# Patient Record
Sex: Female | Born: 1955 | Race: White | Hispanic: No | Marital: Married | Smoking: Current every day smoker
Health system: Southern US, Community
[De-identification: ages and names within clinical notes are randomized; demographics above are authoritative.]

## PROBLEM LIST (undated history)

## (undated) DIAGNOSIS — M797 Fibromyalgia: Secondary | ICD-10-CM

## (undated) DIAGNOSIS — G9340 Encephalopathy, unspecified: Secondary | ICD-10-CM

## (undated) DIAGNOSIS — Z720 Tobacco use: Secondary | ICD-10-CM

## (undated) DIAGNOSIS — I639 Cerebral infarction, unspecified: Secondary | ICD-10-CM

## (undated) DIAGNOSIS — I10 Essential (primary) hypertension: Secondary | ICD-10-CM

## (undated) DIAGNOSIS — I700 Atherosclerosis of aorta: Secondary | ICD-10-CM

## (undated) DIAGNOSIS — E119 Type 2 diabetes mellitus without complications: Secondary | ICD-10-CM

## (undated) HISTORY — PX: HERNIA REPAIR: SHX51

## (undated) HISTORY — PX: CHOLECYSTECTOMY: SHX55

## (undated) HISTORY — PX: CERVICAL BIOPSY: SHX590

---

## 1998-05-05 ENCOUNTER — Emergency Department (HOSPITAL_COMMUNITY): Admission: EM | Admit: 1998-05-05 | Discharge: 1998-05-05 | Payer: Self-pay | Admitting: Emergency Medicine

## 1999-08-29 ENCOUNTER — Emergency Department (HOSPITAL_COMMUNITY): Admission: EM | Admit: 1999-08-29 | Discharge: 1999-08-29 | Payer: Self-pay | Admitting: Emergency Medicine

## 2000-08-25 ENCOUNTER — Emergency Department (HOSPITAL_COMMUNITY): Admission: EM | Admit: 2000-08-25 | Discharge: 2000-08-25 | Payer: Self-pay | Admitting: Emergency Medicine

## 2001-03-02 ENCOUNTER — Emergency Department (HOSPITAL_COMMUNITY): Admission: EM | Admit: 2001-03-02 | Discharge: 2001-03-02 | Payer: Self-pay | Admitting: Emergency Medicine

## 2001-03-25 ENCOUNTER — Encounter: Admission: RE | Admit: 2001-03-25 | Discharge: 2001-03-25 | Payer: Self-pay | Admitting: Internal Medicine

## 2001-03-26 ENCOUNTER — Encounter: Admission: RE | Admit: 2001-03-26 | Discharge: 2001-03-26 | Payer: Self-pay | Admitting: Obstetrics

## 2001-03-27 ENCOUNTER — Other Ambulatory Visit: Admission: RE | Admit: 2001-03-27 | Discharge: 2001-03-27 | Payer: Self-pay | Admitting: Obstetrics

## 2001-03-27 ENCOUNTER — Encounter: Admission: RE | Admit: 2001-03-27 | Discharge: 2001-03-27 | Payer: Self-pay | Admitting: *Deleted

## 2001-03-28 ENCOUNTER — Emergency Department (HOSPITAL_COMMUNITY): Admission: EM | Admit: 2001-03-28 | Discharge: 2001-03-29 | Payer: Self-pay | Admitting: Emergency Medicine

## 2001-12-08 ENCOUNTER — Emergency Department (HOSPITAL_COMMUNITY): Admission: EM | Admit: 2001-12-08 | Discharge: 2001-12-08 | Payer: Self-pay | Admitting: *Deleted

## 2001-12-08 ENCOUNTER — Encounter: Payer: Self-pay | Admitting: *Deleted

## 2001-12-23 ENCOUNTER — Encounter: Admission: RE | Admit: 2001-12-23 | Discharge: 2001-12-23 | Payer: Self-pay | Admitting: Internal Medicine

## 2001-12-31 ENCOUNTER — Emergency Department (HOSPITAL_COMMUNITY): Admission: EM | Admit: 2001-12-31 | Discharge: 2001-12-31 | Payer: Self-pay | Admitting: Emergency Medicine

## 2002-01-17 ENCOUNTER — Encounter: Payer: Self-pay | Admitting: Emergency Medicine

## 2002-01-17 ENCOUNTER — Emergency Department (HOSPITAL_COMMUNITY): Admission: EM | Admit: 2002-01-17 | Discharge: 2002-01-17 | Payer: Self-pay | Admitting: Emergency Medicine

## 2002-03-25 ENCOUNTER — Emergency Department (HOSPITAL_COMMUNITY): Admission: EM | Admit: 2002-03-25 | Discharge: 2002-03-26 | Payer: Self-pay | Admitting: Emergency Medicine

## 2002-04-22 ENCOUNTER — Encounter: Admission: RE | Admit: 2002-04-22 | Discharge: 2002-04-22 | Payer: Self-pay | Admitting: *Deleted

## 2002-04-26 ENCOUNTER — Encounter: Admission: RE | Admit: 2002-04-26 | Discharge: 2002-04-26 | Payer: Self-pay | Admitting: Internal Medicine

## 2002-04-27 ENCOUNTER — Ambulatory Visit (HOSPITAL_COMMUNITY): Admission: RE | Admit: 2002-04-27 | Discharge: 2002-04-27 | Payer: Self-pay | Admitting: *Deleted

## 2002-05-06 ENCOUNTER — Encounter: Admission: RE | Admit: 2002-05-06 | Discharge: 2002-05-06 | Payer: Self-pay | Admitting: Obstetrics and Gynecology

## 2002-06-02 ENCOUNTER — Emergency Department (HOSPITAL_COMMUNITY): Admission: EM | Admit: 2002-06-02 | Discharge: 2002-06-02 | Payer: Self-pay | Admitting: Emergency Medicine

## 2003-07-12 ENCOUNTER — Ambulatory Visit (HOSPITAL_COMMUNITY): Admission: RE | Admit: 2003-07-12 | Discharge: 2003-07-12 | Payer: Self-pay | Admitting: Advanced Practice Midwife

## 2003-07-12 ENCOUNTER — Encounter: Payer: Self-pay | Admitting: Obstetrics and Gynecology

## 2003-12-05 ENCOUNTER — Emergency Department (HOSPITAL_COMMUNITY): Admission: EM | Admit: 2003-12-05 | Discharge: 2003-12-05 | Payer: Self-pay | Admitting: Emergency Medicine

## 2004-01-29 ENCOUNTER — Emergency Department (HOSPITAL_COMMUNITY): Admission: AD | Admit: 2004-01-29 | Discharge: 2004-01-29 | Payer: Self-pay | Admitting: Family Medicine

## 2004-01-29 ENCOUNTER — Emergency Department (HOSPITAL_COMMUNITY): Admission: EM | Admit: 2004-01-29 | Discharge: 2004-01-29 | Payer: Self-pay | Admitting: Emergency Medicine

## 2004-05-31 ENCOUNTER — Emergency Department (HOSPITAL_COMMUNITY): Admission: EM | Admit: 2004-05-31 | Discharge: 2004-05-31 | Payer: Self-pay | Admitting: Emergency Medicine

## 2004-05-31 IMAGING — CR DG CERVICAL SPINE COMPLETE 4+V
5 series · 5 of 5 positions shown · non-contrast
Comparison: none

CLINICAL DATA: Fell this morning with pain in the neck. 
 CLEARING CERVICAL SPINE 
 Cross-table lateral clearing views of the cervical spine were obtained with the patient in a collar.  The cervical vertebrae are in normal alignment with normal intervertebral disk spaces.  No prevertebral soft tissue swelling is seen.
 IMPRESSION
 Negative cross-table lateral clearing views of the cervical spine.
 CERVICAL SPINE COMPLETE
 Five views of the cervical spine were then obtained.  The cervical vertebrae are in normal alignment with normal intervertebral disk spaces.  There is perhaps very minimal narrowing of the C5-6 interspace.  No prevertebral soft tissue swelling is seen.  On the oblique views no acute abnormality is seen.  Minimal foraminal narrowing is seen at C5-6 with the remainder of the foramina being patent.  The odontoid process is intact.
 1.  Normal alignment with no acute abnormality.
 2.  Very mild degenerative disk disease at C5-6.

[view not recorded (1 of 5)]
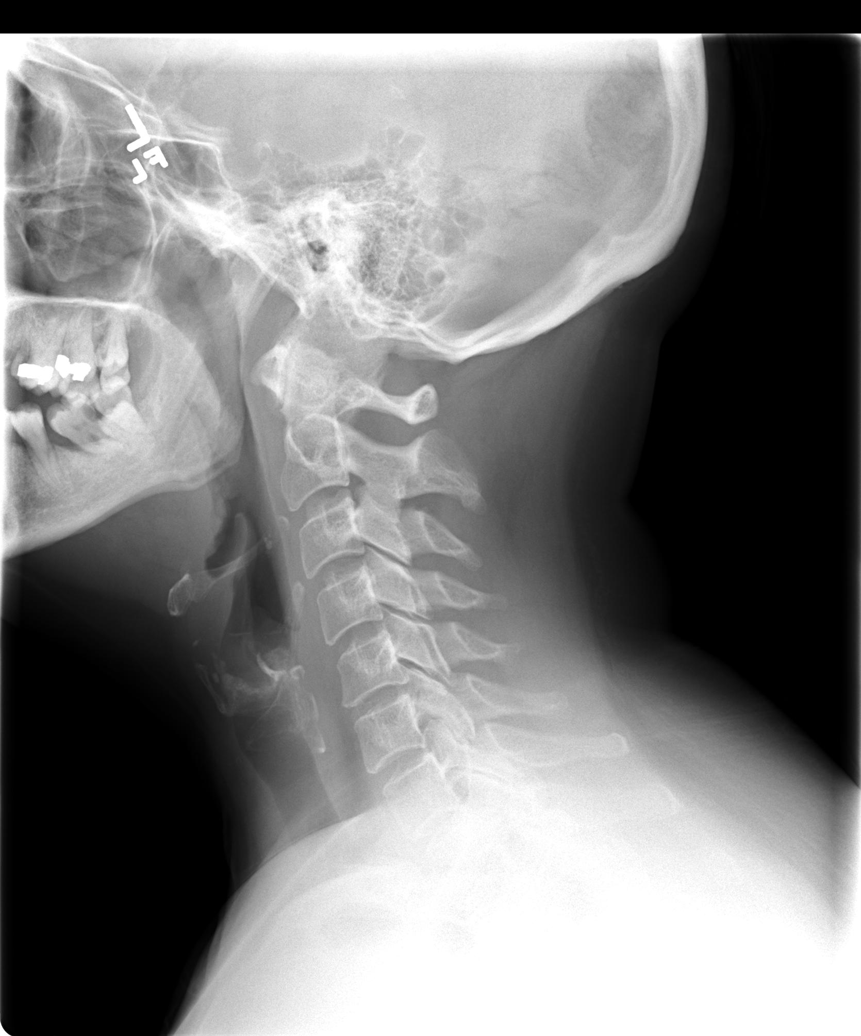

[view not recorded (2 of 5)]
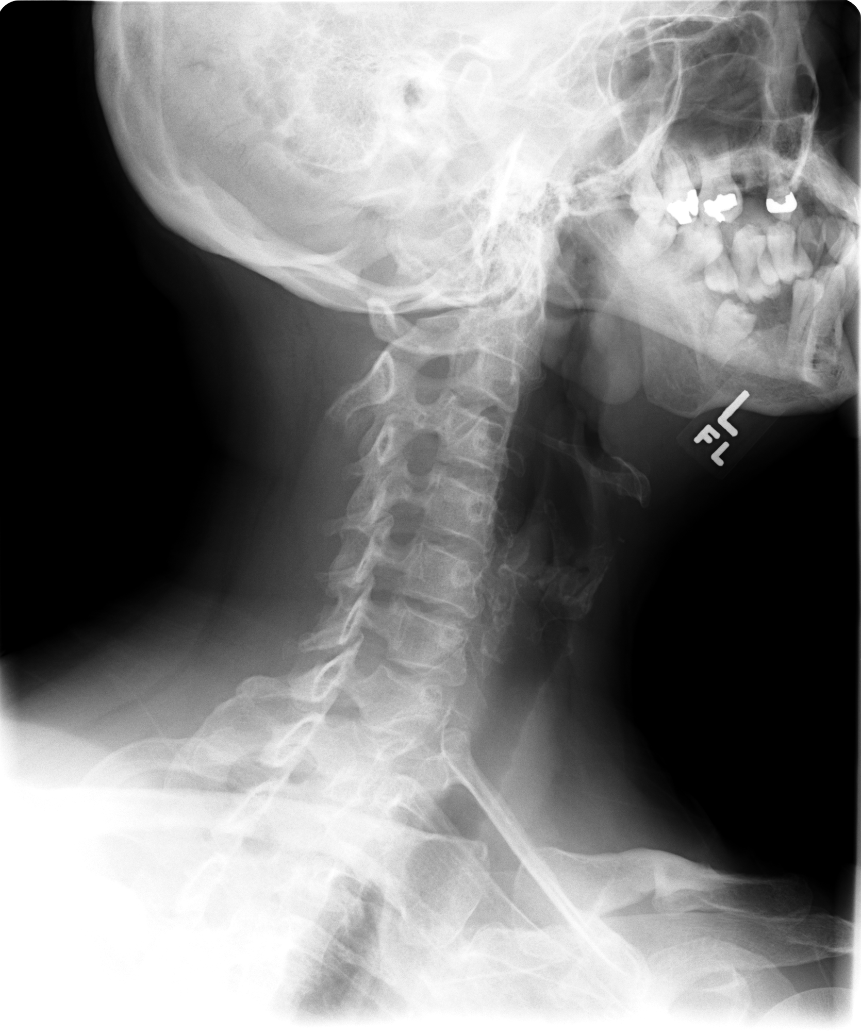

[view not recorded (3 of 5)]
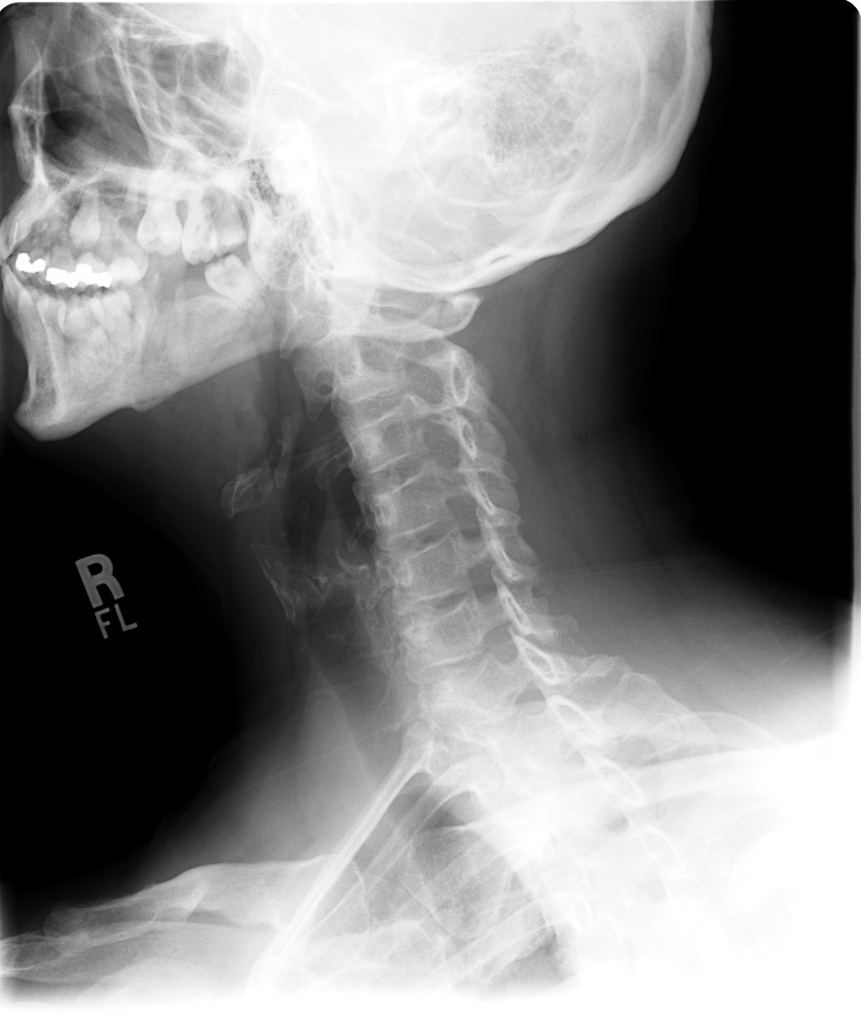

[view not recorded (4 of 5)]
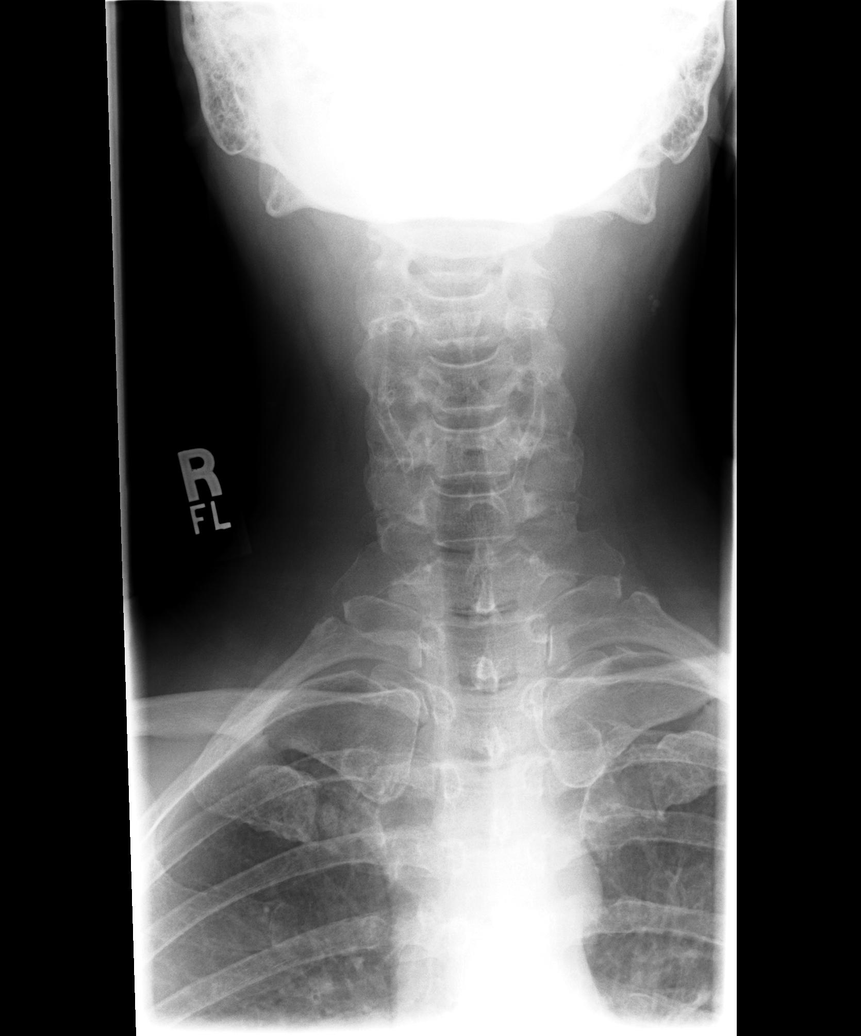

[view not recorded (5 of 5)]
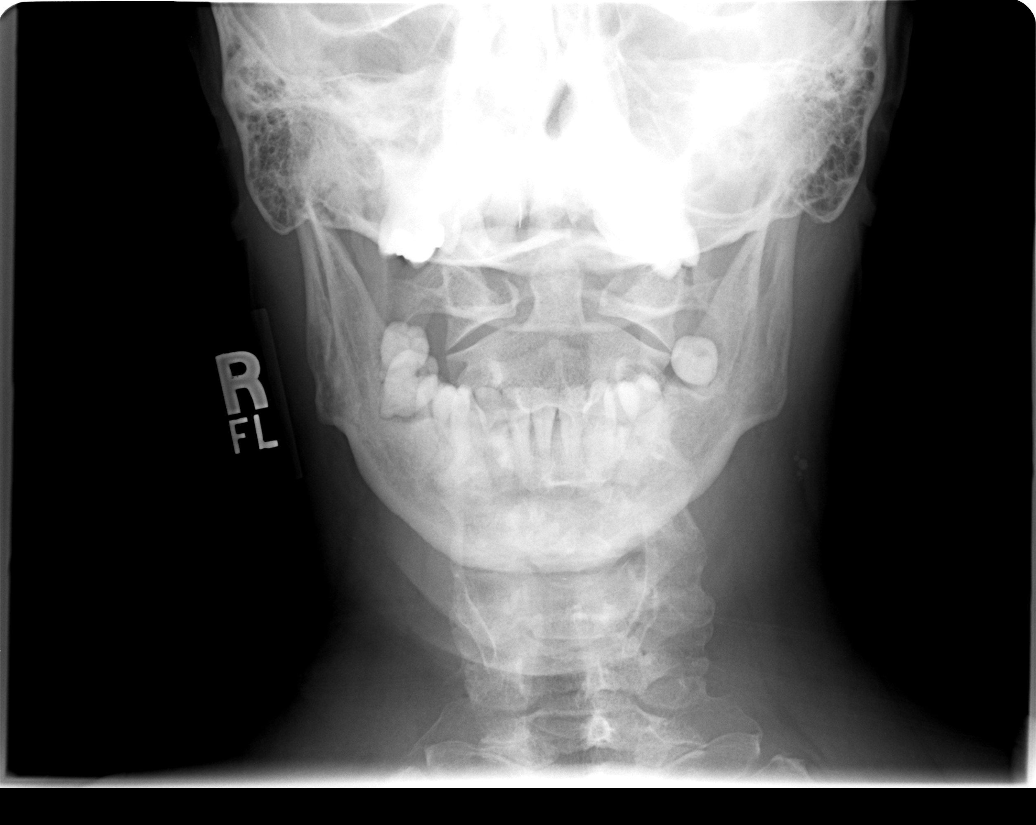

[5 of 5 positions shown; findings below may reference images not displayed]

## 2004-05-31 IMAGING — CR DG CERVICAL SPINE 1V CLEARING
2 series · 2 of 2 positions shown · non-contrast
Comparison: none

CLINICAL DATA: Fell this morning with pain in the neck. 
 CLEARING CERVICAL SPINE 
 Cross-table lateral clearing views of the cervical spine were obtained with the patient in a collar.  The cervical vertebrae are in normal alignment with normal intervertebral disk spaces.  No prevertebral soft tissue swelling is seen.
 IMPRESSION
 Negative cross-table lateral clearing views of the cervical spine.
 CERVICAL SPINE COMPLETE
 Five views of the cervical spine were then obtained.  The cervical vertebrae are in normal alignment with normal intervertebral disk spaces.  There is perhaps very minimal narrowing of the C5-6 interspace.  No prevertebral soft tissue swelling is seen.  On the oblique views no acute abnormality is seen.  Minimal foraminal narrowing is seen at C5-6 with the remainder of the foramina being patent.  The odontoid process is intact.
 1.  Normal alignment with no acute abnormality.
 2.  Very mild degenerative disk disease at C5-6.

[view not recorded (1 of 2)]
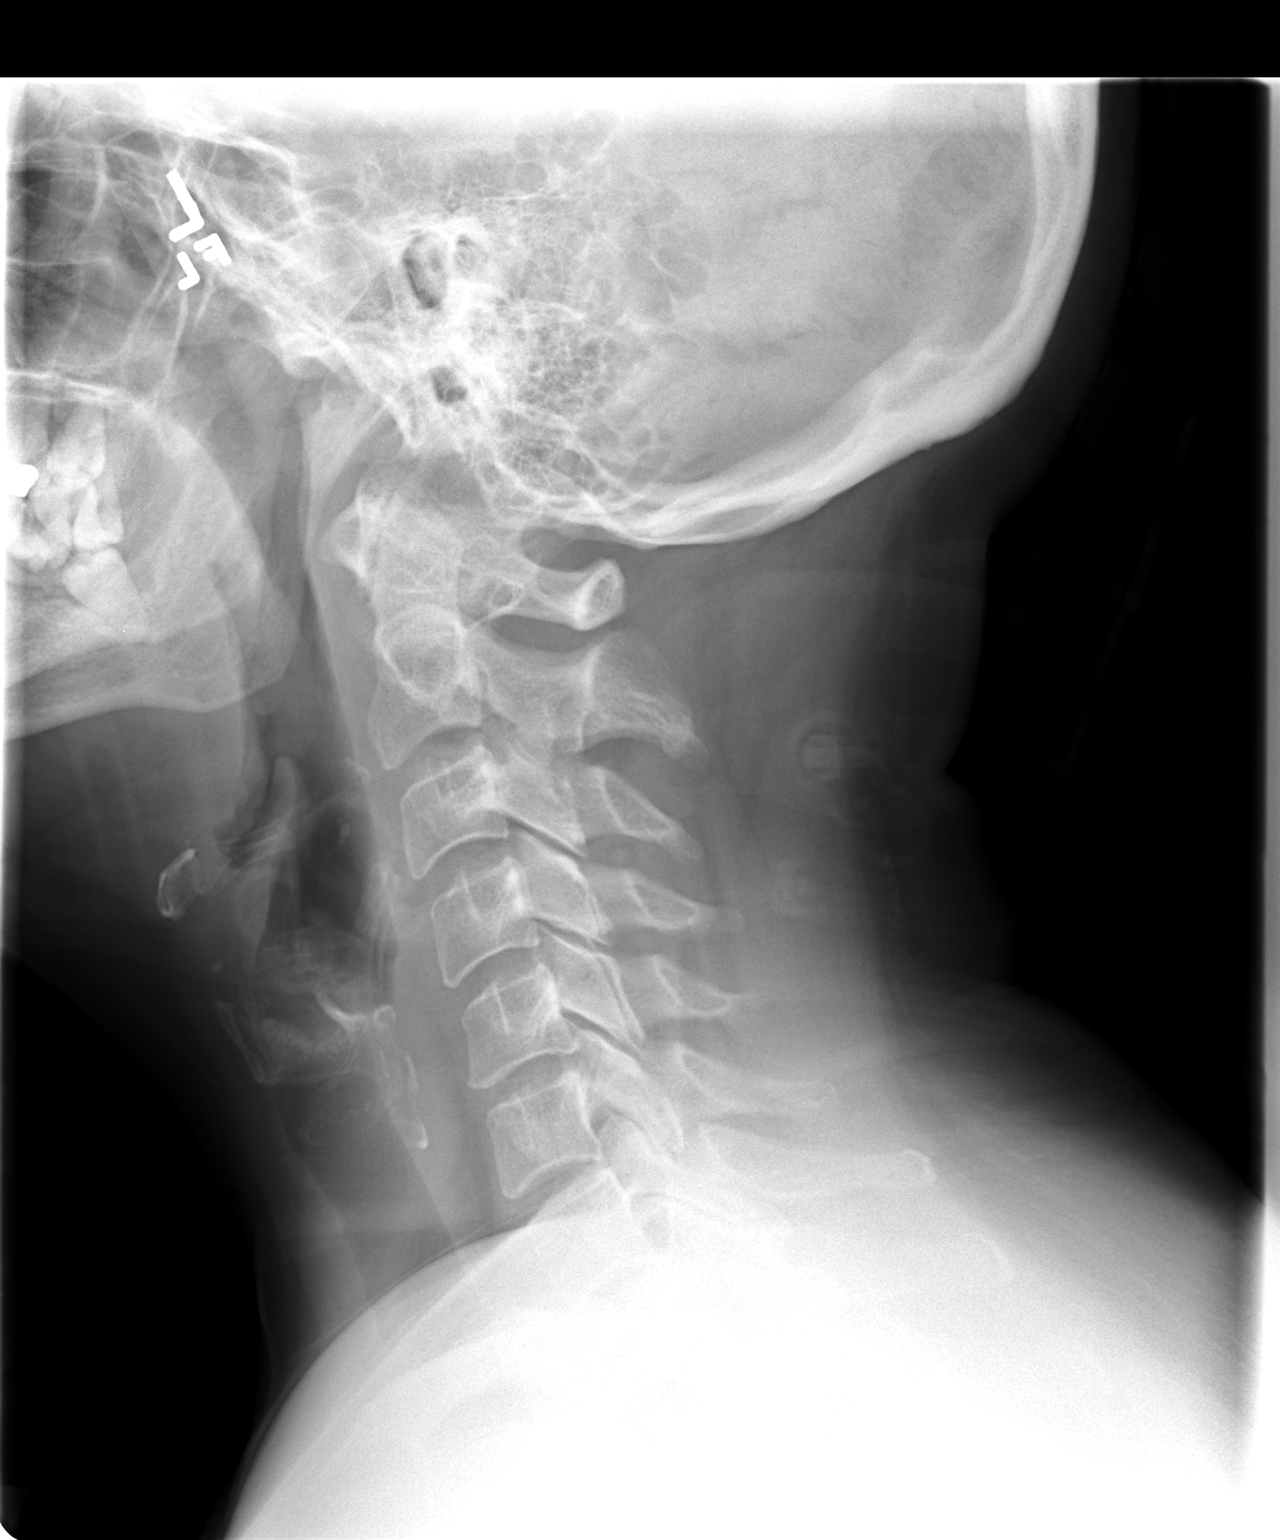

[view not recorded (2 of 2)]
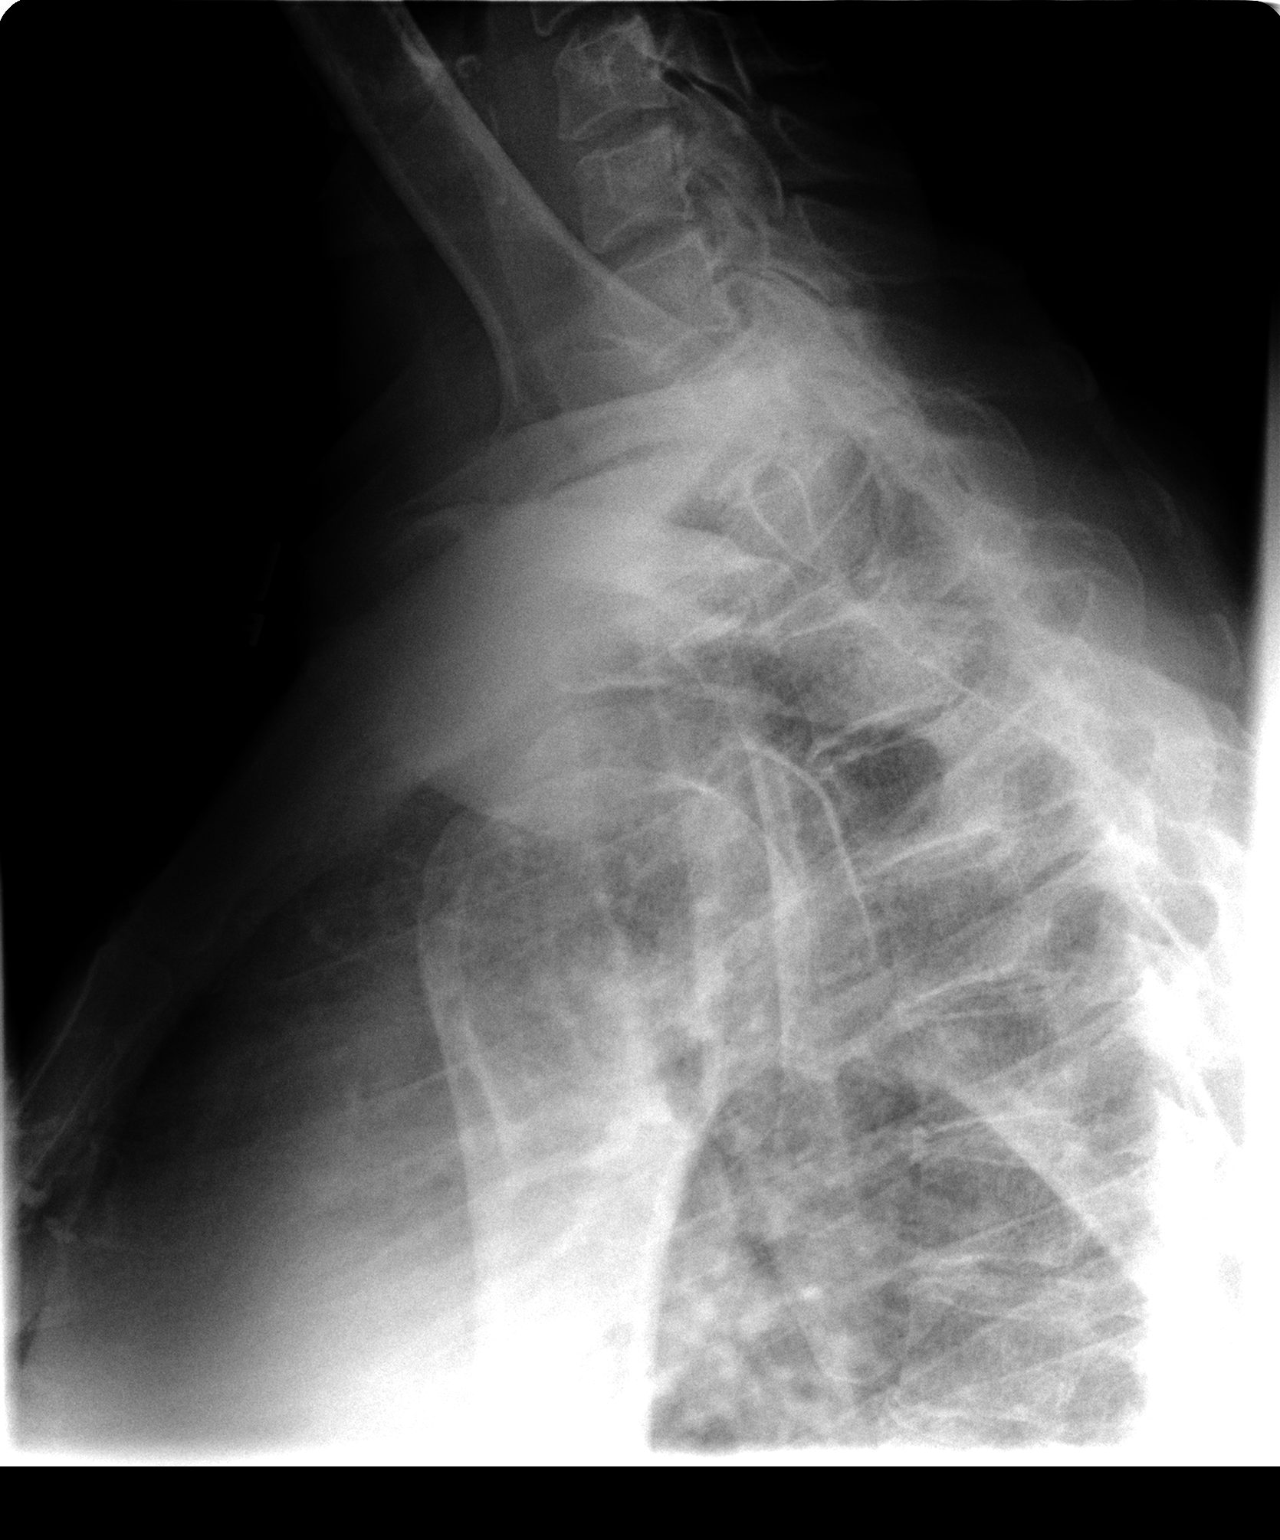

[2 of 2 positions shown; findings below may reference images not displayed]

## 2004-12-09 ENCOUNTER — Emergency Department (HOSPITAL_COMMUNITY): Admission: EM | Admit: 2004-12-09 | Discharge: 2004-12-10 | Payer: Self-pay | Admitting: Emergency Medicine

## 2004-12-09 IMAGING — CR DG KNEE COMPLETE 4+V*R*
4 series · 4 of 4 positions shown · non-contrast
Comparison: none

CLINICAL DATA: Knee injury; trauma; pain
 RIGHT KNEE FOUR VIEWS:
 Tricompartmental mild to moderate degenerative changes manifested by joint space narrowing and osteophytosis noted.  No evidence of acute fracture, subluxation, or dislocation.  Calcified loose body in the anterior joint is identified.  No evidence of fracture or knee effusion.

[view not recorded (1 of 4)]
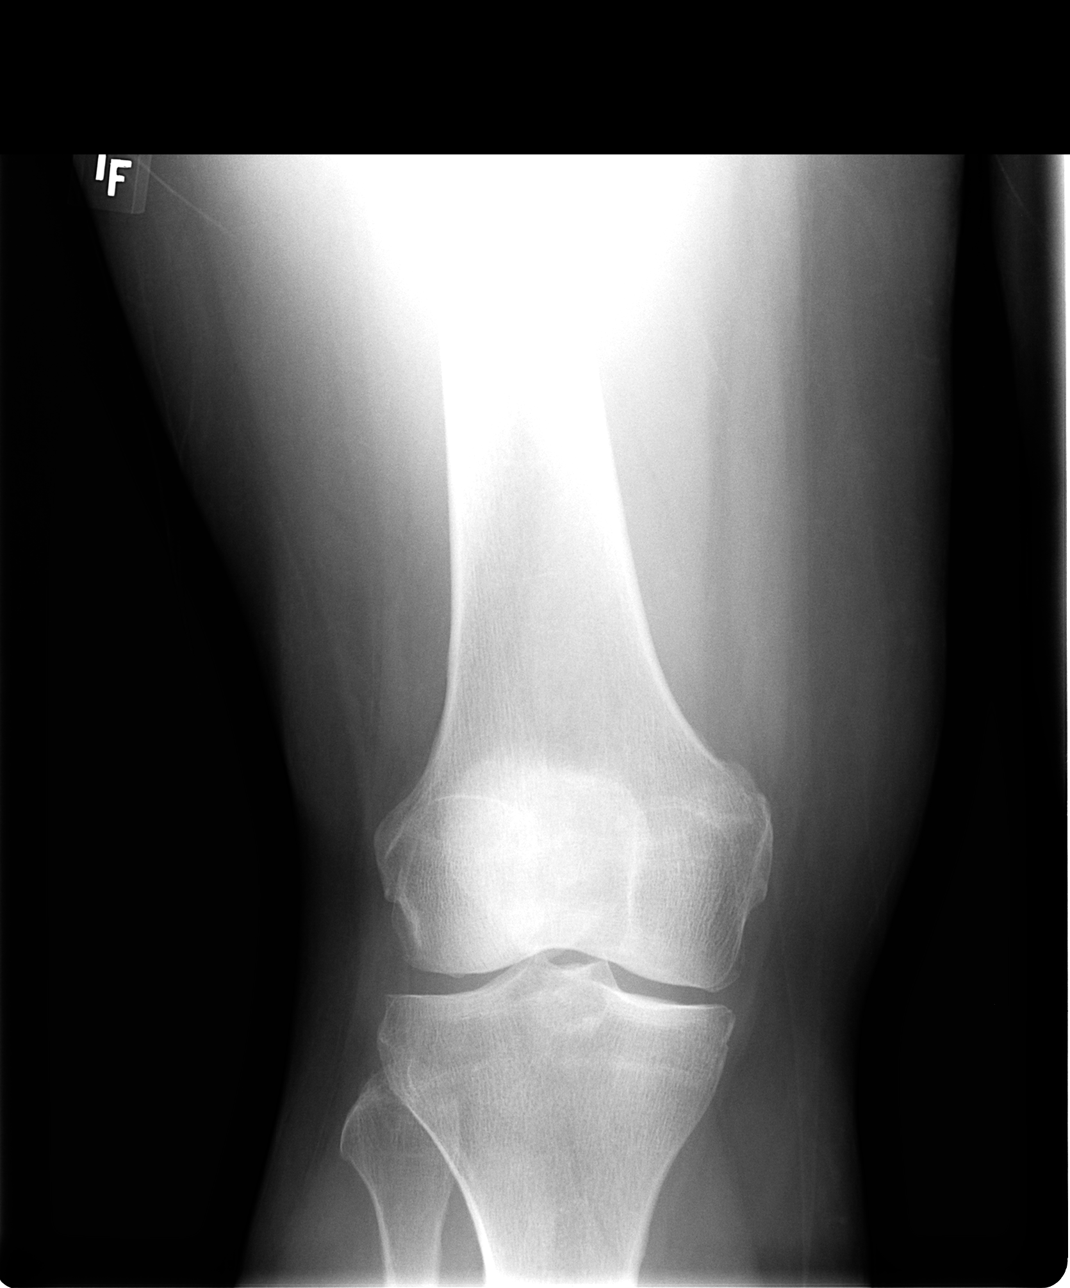

[view not recorded (2 of 4)]
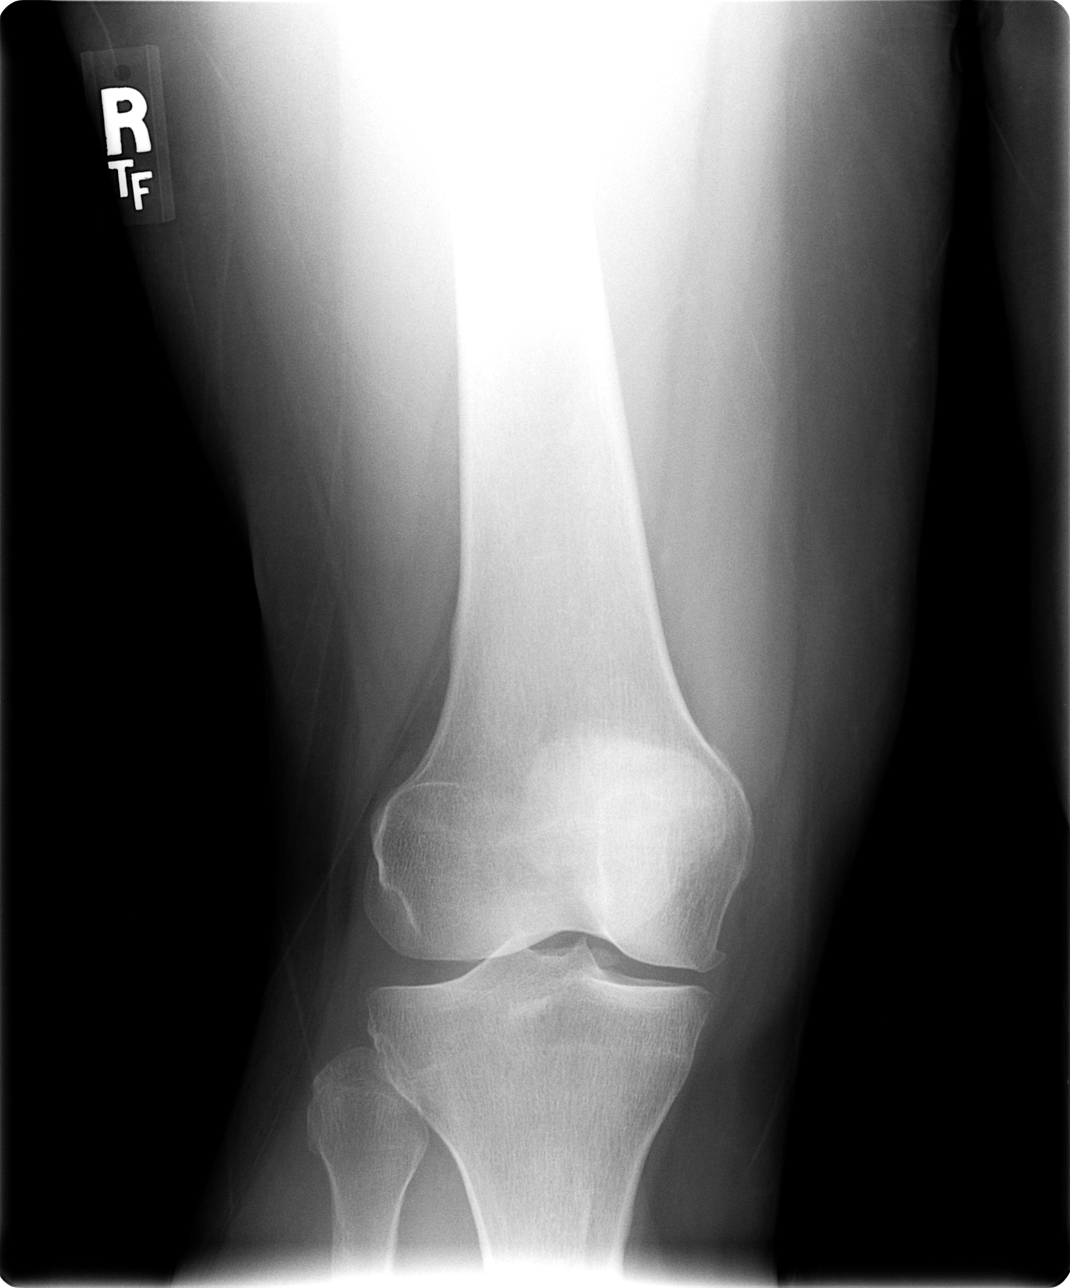

[view not recorded (3 of 4)]
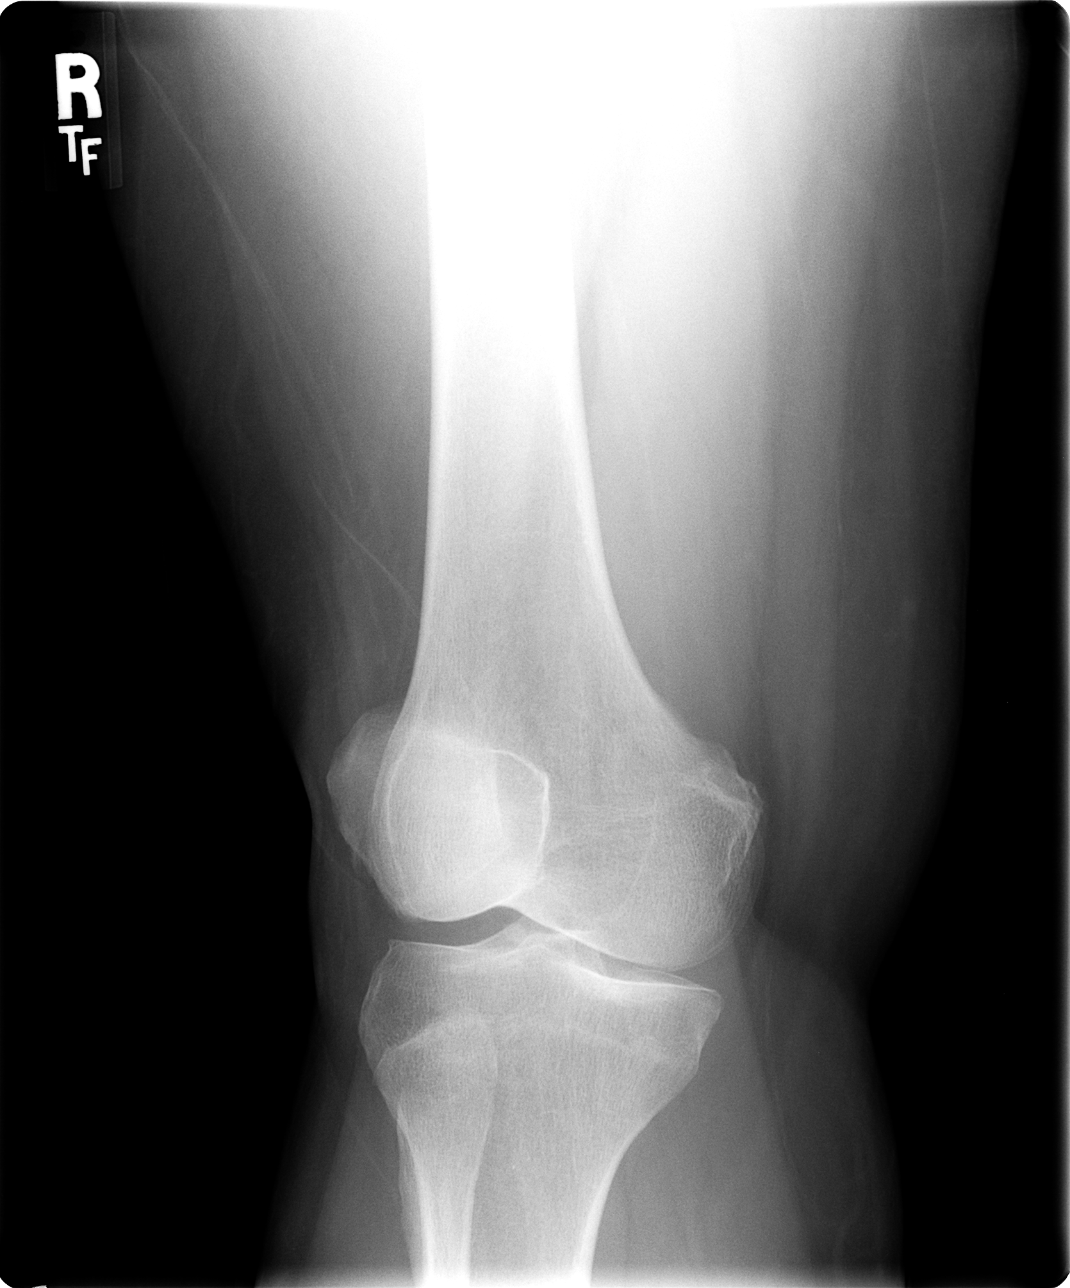

[view not recorded (4 of 4)]
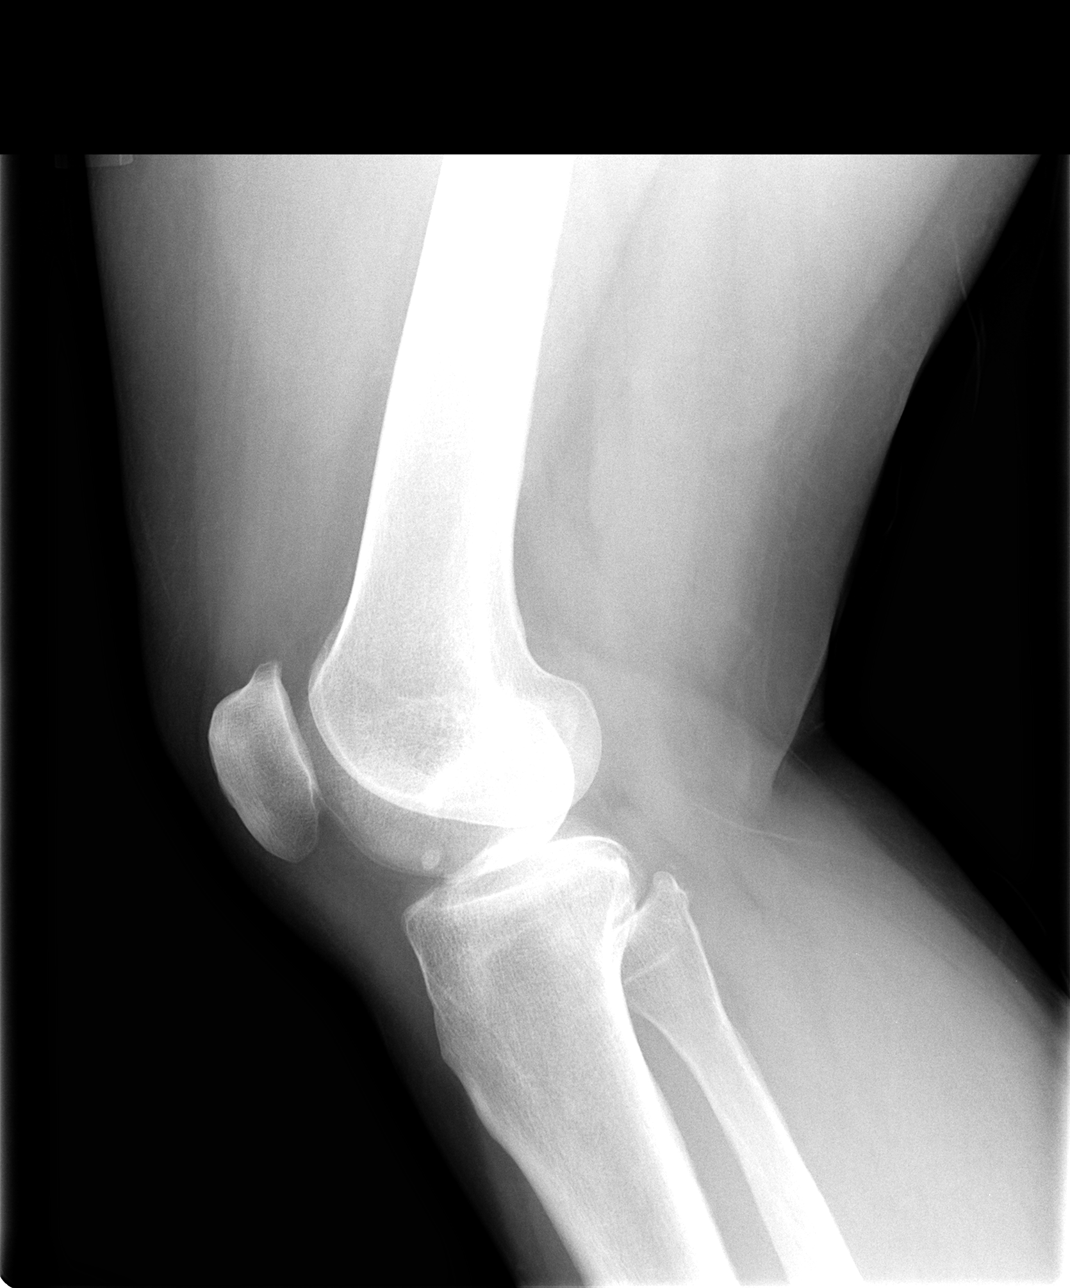

[4 of 4 positions shown; findings below may reference images not displayed]

IMPRESSION: 1.  No acute abnormality. 
 2.  Mild to moderate degenerative changes and loose body.

## 2004-12-18 ENCOUNTER — Emergency Department (HOSPITAL_COMMUNITY): Admission: EM | Admit: 2004-12-18 | Discharge: 2004-12-18 | Payer: Self-pay | Admitting: Emergency Medicine

## 2004-12-18 IMAGING — CR DG ABDOMEN 1V
1 series · 1 of 1 positions shown · non-contrast
Comparison: No images.

CLINICAL DATA: Upper abdominal pain.
 ONE VIEW ABDOMEN:

[view not recorded]
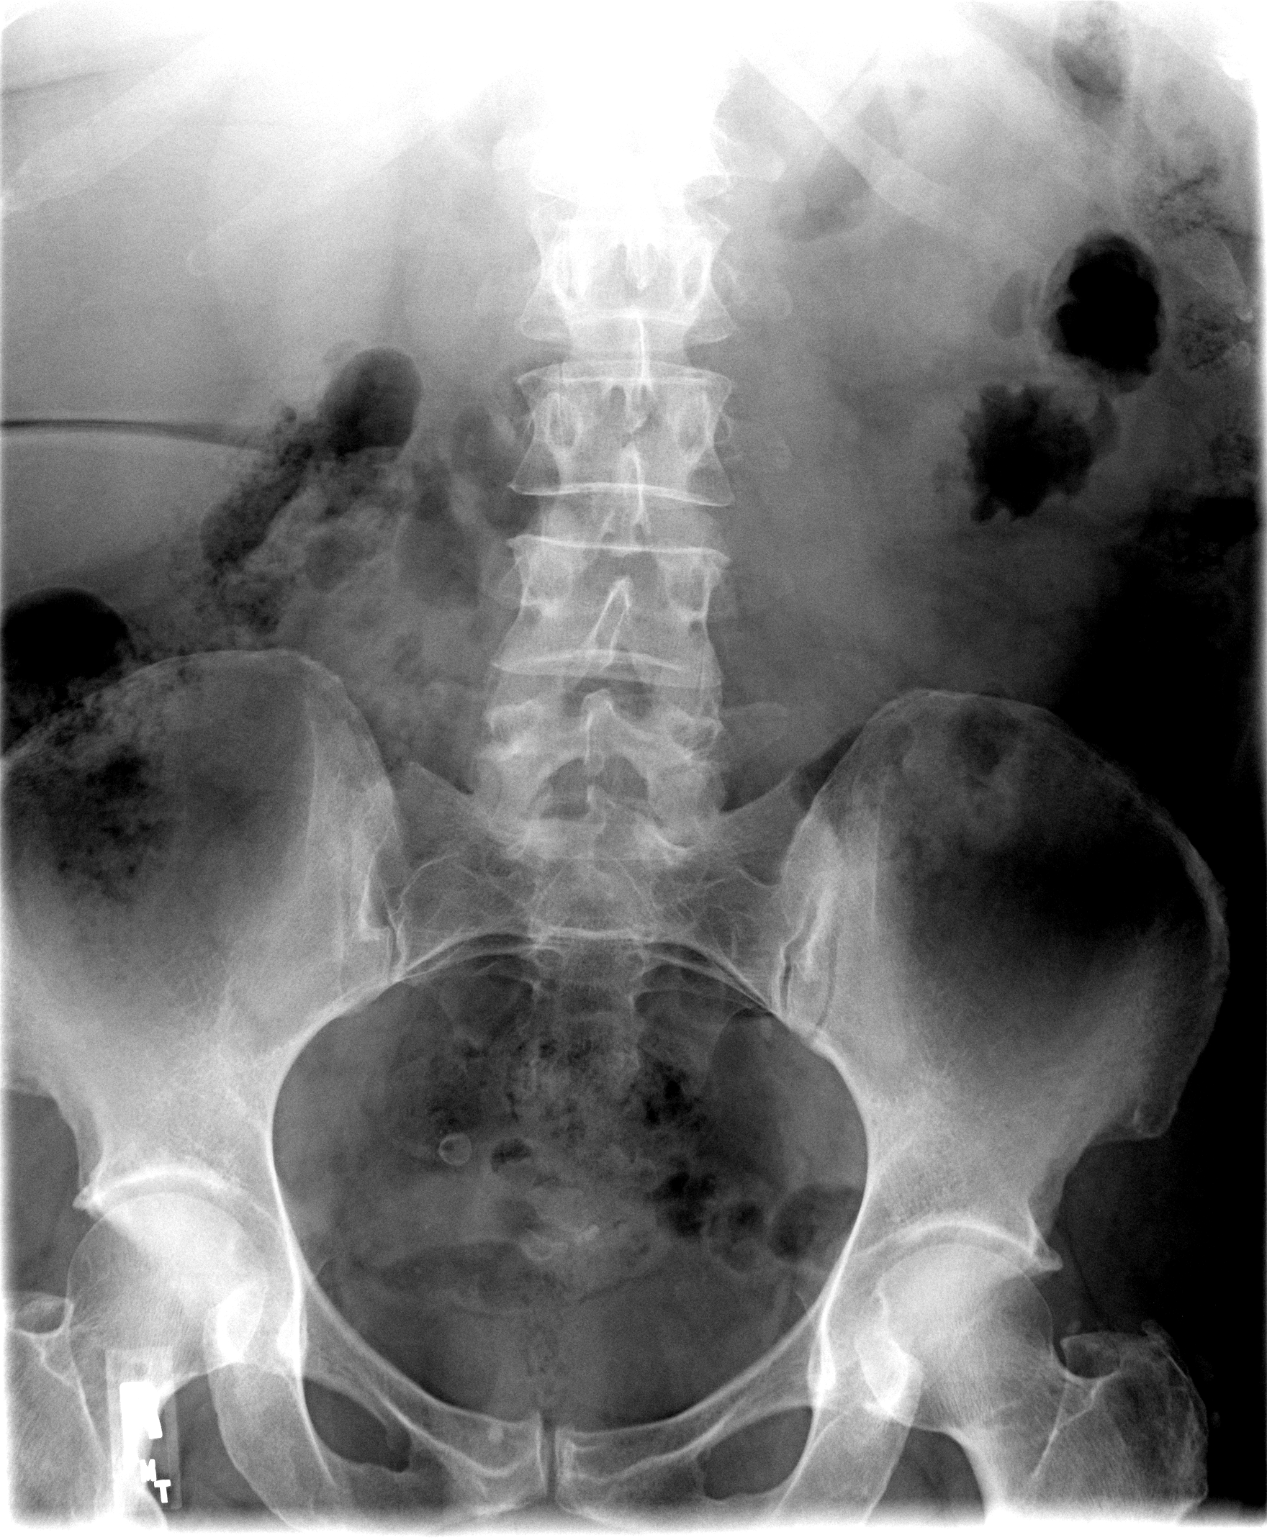

[1 of 1 positions shown; findings below may reference images not displayed]

There is a report of a film done on [DATE] describing cholecystectomy clips and possible hepatomegaly.
FINDINGS: As previously reported, there is possible hepatomegaly and cholecystectomy clips.  The psoas margins are intact.  There is a phlebolith in the right pelvis.
IMPRESSION: 1.  Possible hepatomegaly.
 2.  Prior cholecystectomy.
 3.  No acute or specific findings.

## 2005-01-01 ENCOUNTER — Ambulatory Visit: Payer: Self-pay | Admitting: Family Medicine

## 2005-02-07 ENCOUNTER — Emergency Department (HOSPITAL_COMMUNITY): Admission: EM | Admit: 2005-02-07 | Discharge: 2005-02-07 | Payer: Self-pay | Admitting: Emergency Medicine

## 2005-02-08 ENCOUNTER — Emergency Department (HOSPITAL_COMMUNITY): Admission: EM | Admit: 2005-02-08 | Discharge: 2005-02-08 | Payer: Self-pay | Admitting: *Deleted

## 2005-02-28 ENCOUNTER — Ambulatory Visit: Payer: Self-pay | Admitting: Family Medicine

## 2005-04-29 ENCOUNTER — Encounter: Admission: RE | Admit: 2005-04-29 | Discharge: 2005-04-29 | Payer: Self-pay | Admitting: Obstetrics & Gynecology

## 2005-05-02 ENCOUNTER — Emergency Department (HOSPITAL_COMMUNITY): Admission: EM | Admit: 2005-05-02 | Discharge: 2005-05-02 | Payer: Self-pay | Admitting: Emergency Medicine

## 2005-09-14 ENCOUNTER — Emergency Department (HOSPITAL_COMMUNITY): Admission: EM | Admit: 2005-09-14 | Discharge: 2005-09-14 | Payer: Self-pay | Admitting: Emergency Medicine

## 2006-03-26 ENCOUNTER — Emergency Department (HOSPITAL_COMMUNITY): Admission: EM | Admit: 2006-03-26 | Discharge: 2006-03-26 | Payer: Self-pay | Admitting: Emergency Medicine

## 2006-04-03 ENCOUNTER — Ambulatory Visit: Payer: Self-pay | Admitting: Family Medicine

## 2006-05-19 ENCOUNTER — Emergency Department (HOSPITAL_COMMUNITY): Admission: EM | Admit: 2006-05-19 | Discharge: 2006-05-19 | Payer: Self-pay | Admitting: Emergency Medicine

## 2006-06-03 ENCOUNTER — Emergency Department (HOSPITAL_COMMUNITY): Admission: EM | Admit: 2006-06-03 | Discharge: 2006-06-03 | Payer: Self-pay | Admitting: Emergency Medicine

## 2006-06-04 ENCOUNTER — Emergency Department (HOSPITAL_COMMUNITY): Admission: EM | Admit: 2006-06-04 | Discharge: 2006-06-04 | Payer: Self-pay | Admitting: Emergency Medicine

## 2006-06-09 ENCOUNTER — Emergency Department (HOSPITAL_COMMUNITY): Admission: EM | Admit: 2006-06-09 | Discharge: 2006-06-09 | Payer: Self-pay | Admitting: Emergency Medicine

## 2006-06-27 ENCOUNTER — Emergency Department (HOSPITAL_COMMUNITY): Admission: EM | Admit: 2006-06-27 | Discharge: 2006-06-27 | Payer: Self-pay | Admitting: Emergency Medicine

## 2009-10-31 ENCOUNTER — Encounter: Payer: Self-pay | Admitting: Family Medicine

## 2010-12-02 ENCOUNTER — Encounter: Payer: Self-pay | Admitting: Family Medicine

## 2019-06-25 ENCOUNTER — Other Ambulatory Visit: Payer: Self-pay

## 2019-06-25 ENCOUNTER — Emergency Department: Payer: Medicare HMO

## 2019-06-25 ENCOUNTER — Observation Stay
Admission: EM | Admit: 2019-06-25 | Discharge: 2019-06-26 | Disposition: A | Discharge status: Home / Self Care | Payer: Medicare HMO | Attending: Internal Medicine | Admitting: Internal Medicine

## 2019-06-25 ENCOUNTER — Observation Stay: Payer: Medicare HMO

## 2019-06-25 DIAGNOSIS — Z79899 Other long term (current) drug therapy: Secondary | ICD-10-CM | POA: Insufficient documentation

## 2019-06-25 DIAGNOSIS — M79601 Pain in right arm: Secondary | ICD-10-CM | POA: Diagnosis present

## 2019-06-25 DIAGNOSIS — I10 Essential (primary) hypertension: Secondary | ICD-10-CM | POA: Diagnosis not present

## 2019-06-25 DIAGNOSIS — R7989 Other specified abnormal findings of blood chemistry: Secondary | ICD-10-CM | POA: Diagnosis present

## 2019-06-25 DIAGNOSIS — R778 Other specified abnormalities of plasma proteins: Secondary | ICD-10-CM | POA: Diagnosis present

## 2019-06-25 DIAGNOSIS — I700 Atherosclerosis of aorta: Secondary | ICD-10-CM | POA: Diagnosis not present

## 2019-06-25 DIAGNOSIS — E119 Type 2 diabetes mellitus without complications: Secondary | ICD-10-CM | POA: Insufficient documentation

## 2019-06-25 DIAGNOSIS — Z20828 Contact with and (suspected) exposure to other viral communicable diseases: Secondary | ICD-10-CM | POA: Insufficient documentation

## 2019-06-25 DIAGNOSIS — R000 Tachycardia, unspecified: Secondary | ICD-10-CM | POA: Insufficient documentation

## 2019-06-25 DIAGNOSIS — M79621 Pain in right upper arm: Principal | ICD-10-CM | POA: Insufficient documentation

## 2019-06-25 DIAGNOSIS — R9431 Abnormal electrocardiogram [ECG] [EKG]: Secondary | ICD-10-CM

## 2019-06-25 DIAGNOSIS — F17210 Nicotine dependence, cigarettes, uncomplicated: Secondary | ICD-10-CM | POA: Diagnosis not present

## 2019-06-25 DIAGNOSIS — Z794 Long term (current) use of insulin: Secondary | ICD-10-CM | POA: Diagnosis not present

## 2019-06-25 DIAGNOSIS — M79603 Pain in arm, unspecified: Secondary | ICD-10-CM | POA: Diagnosis present

## 2019-06-25 DIAGNOSIS — I2510 Atherosclerotic heart disease of native coronary artery without angina pectoris: Secondary | ICD-10-CM | POA: Insufficient documentation

## 2019-06-25 DIAGNOSIS — Z9049 Acquired absence of other specified parts of digestive tract: Secondary | ICD-10-CM | POA: Diagnosis not present

## 2019-06-25 DIAGNOSIS — M797 Fibromyalgia: Secondary | ICD-10-CM | POA: Diagnosis not present

## 2019-06-25 DIAGNOSIS — K838 Other specified diseases of biliary tract: Secondary | ICD-10-CM | POA: Insufficient documentation

## 2019-06-25 DIAGNOSIS — I200 Unstable angina: Secondary | ICD-10-CM

## 2019-06-25 HISTORY — DX: Atherosclerosis of aorta: I70.0

## 2019-06-25 HISTORY — DX: Essential (primary) hypertension: I10

## 2019-06-25 HISTORY — DX: Type 2 diabetes mellitus without complications: E11.9

## 2019-06-25 HISTORY — DX: Tobacco use: Z72.0

## 2019-06-25 LAB — CBC
HCT: 44.5 % (ref 36.0–46.0)
Hemoglobin: 15.4 g/dL — ABNORMAL HIGH (ref 12.0–15.0)
MCH: 30.6 pg (ref 26.0–34.0)
MCHC: 34.6 g/dL (ref 30.0–36.0)
MCV: 88.5 fL (ref 80.0–100.0)
Platelets: 385 10*3/uL (ref 150–400)
RBC: 5.03 MIL/uL (ref 3.87–5.11)
RDW: 13.4 % (ref 11.5–15.5)
WBC: 9.1 10*3/uL (ref 4.0–10.5)
nRBC: 0 % (ref 0.0–0.2)

## 2019-06-25 LAB — CK: Total CK: 100 U/L (ref 38–234)

## 2019-06-25 LAB — BASIC METABOLIC PANEL
Anion gap: 13 (ref 5–15)
BUN: 23 mg/dL (ref 8–23)
CO2: 23 mmol/L (ref 22–32)
Calcium: 9.7 mg/dL (ref 8.9–10.3)
Chloride: 97 mmol/L — ABNORMAL LOW (ref 98–111)
Creatinine, Ser: 0.72 mg/dL (ref 0.44–1.00)
GFR calc Af Amer: >60 mL/min (ref >60)
GFR calc non Af Amer: >60 mL/min (ref >60)
Glucose, Bld: 253 mg/dL — ABNORMAL HIGH (ref 70–99)
Potassium: 4 mmol/L (ref 3.5–5.1)
Sodium: 133 mmol/L — ABNORMAL LOW (ref 135–145)

## 2019-06-25 LAB — PROTIME-INR
INR: 0.9 (ref 0.8–1.2)
Prothrombin Time: 12.5 seconds (ref 11.4–15.2)

## 2019-06-25 LAB — HEPATIC FUNCTION PANEL
ALT: 19 U/L (ref 0–44)
AST: 28 U/L (ref 15–41)
Albumin: 3.4 g/dL — ABNORMAL LOW (ref 3.5–5.0)
Alkaline Phosphatase: 90 U/L (ref 38–126)
Bilirubin, Direct: 0.4 mg/dL — ABNORMAL HIGH (ref 0.0–0.2)
Indirect Bilirubin: 0.8 mg/dL (ref 0.3–0.9)
Total Bilirubin: 1.2 mg/dL (ref 0.3–1.2)
Total Protein: 6.5 g/dL (ref 6.5–8.1)

## 2019-06-25 LAB — HEMOGLOBIN A1C
Hgb A1c MFr Bld: 11.4 % — ABNORMAL HIGH (ref 4.8–5.6)
Mean Plasma Glucose: 280.48 mg/dL

## 2019-06-25 LAB — APTT: aPTT: 25 seconds (ref 24–36)

## 2019-06-25 LAB — GLUCOSE, CAPILLARY: Glucose-Capillary: 311 mg/dL — ABNORMAL HIGH (ref 70–99)

## 2019-06-25 LAB — TROPONIN I (HIGH SENSITIVITY)
Troponin I (High Sensitivity): 187 ng/L (ref <18)
Troponin I (High Sensitivity): 171 ng/L (ref <18)

## 2019-06-25 LAB — SARS CORONAVIRUS 2 BY RT PCR (HOSPITAL ORDER, PERFORMED IN ~~LOC~~ HOSPITAL LAB): SARS Coronavirus 2: NEGATIVE

## 2019-06-25 IMAGING — DX PORTABLE CHEST - 1 VIEW
1 series · 1 of 1 positions shown · non-contrast
Comparison: None.

CLINICAL DATA: Chest pain.

EXAM:
PORTABLE CHEST 1 VIEW

[chest ap]
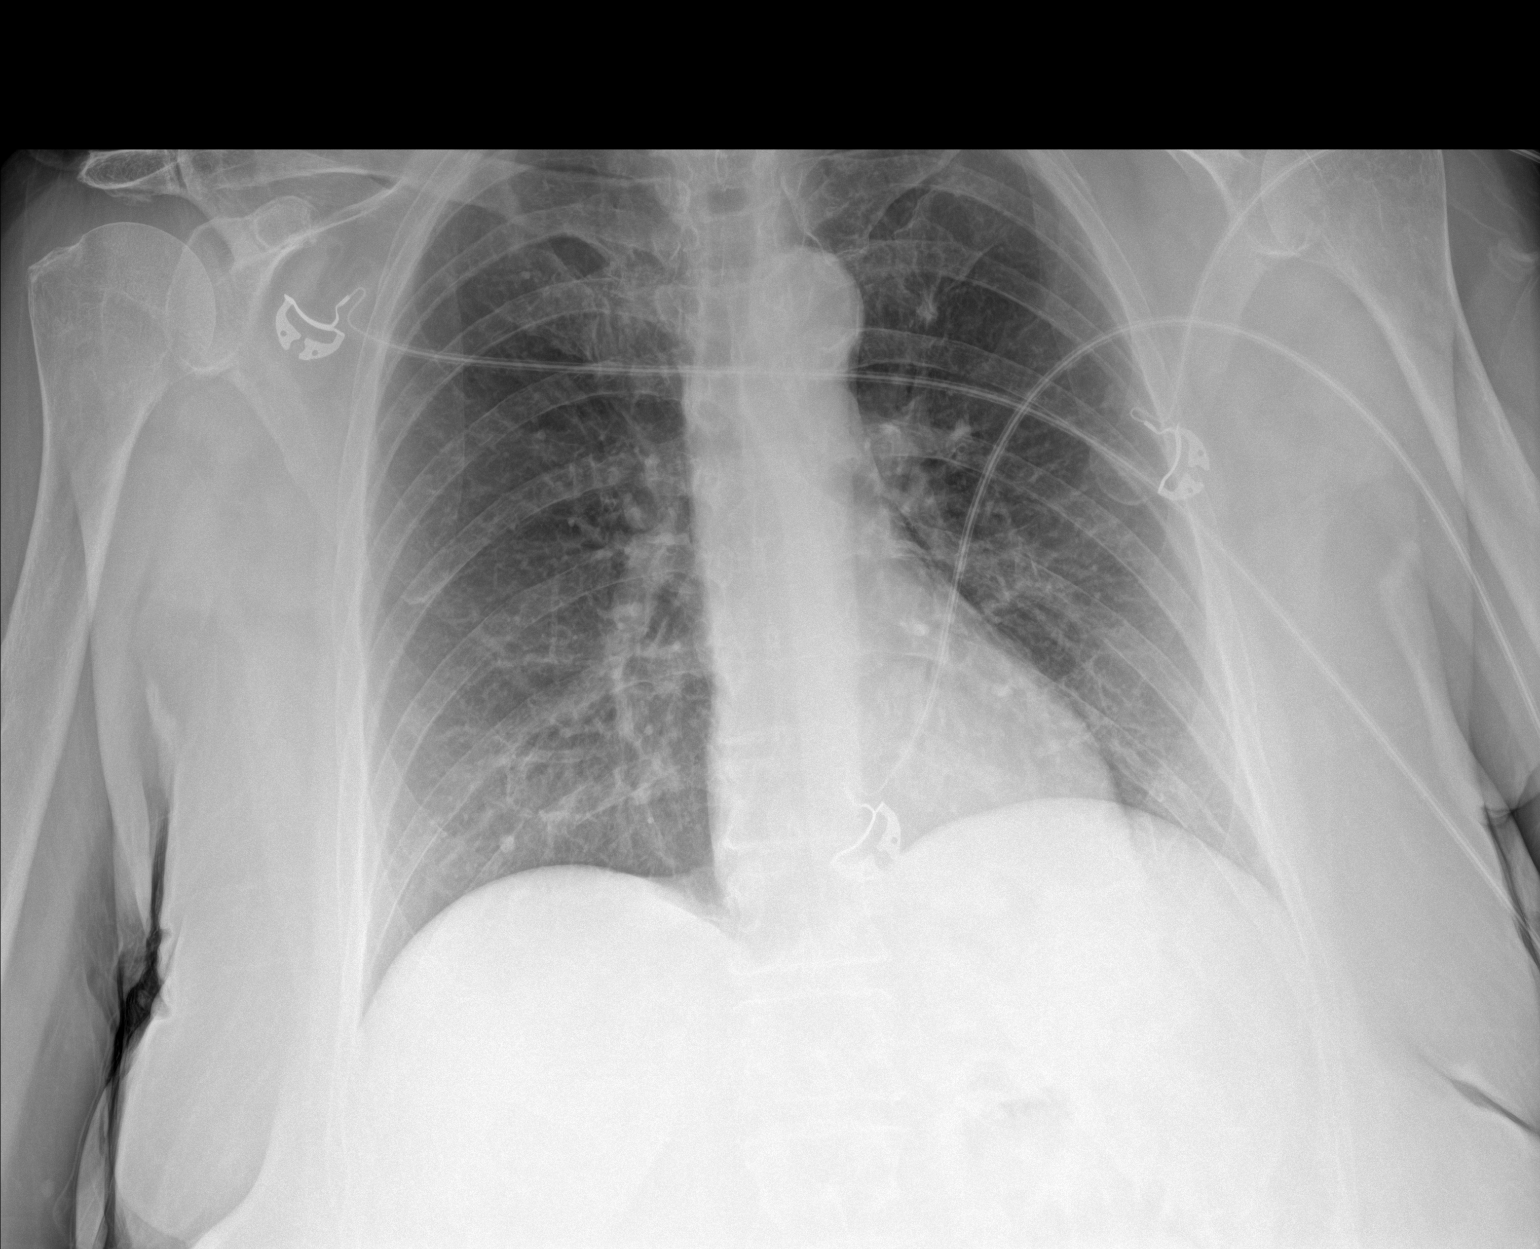

[1 of 1 positions shown; findings below may reference images not displayed]

FINDINGS: The heart size and mediastinal contours are within normal limits.
Both lungs are clear. No pneumothorax or pleural effusion is noted.
The visualized skeletal structures are unremarkable.
IMPRESSION: No active disease.

## 2019-06-25 IMAGING — CT CT ANGIO CHEST-ABD-PELV FOR DISSECTION W/ AND WO/W CM
2 of 7 series · 13 of 46 positions shown, 15 images · IV contrast (omnipaque)
Comparison: None.

CLINICAL DATA: Chest and back pain four days.

EXAM:
CT ANGIOGRAPHY CHEST, ABDOMEN AND PELVIS
TECHNIQUE: Multidetector CT imaging through the chest, abdomen and pelvis was
performed using the standard protocol during bolus administration of
intravenous contrast. Multiplanar reconstructed images and MIPs were
obtained and reviewed to evaluate the vascular anatomy.
CONTRAST:  100mL OMNIPAQUE IOHEXOL 350 MG/ML SOLN

[Series 4: axial arterial · axial · arterial · 0.66mm/px · z∈[-605,-47]mm · 10 of 216 slices shown, 12 images]
[im 15/216  soft-tissue]
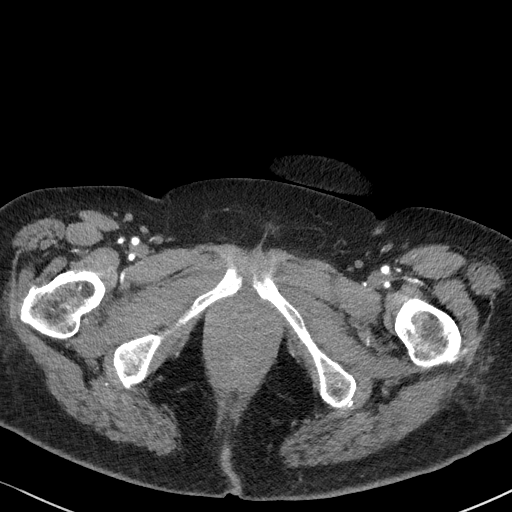
[im 15/216  bone]
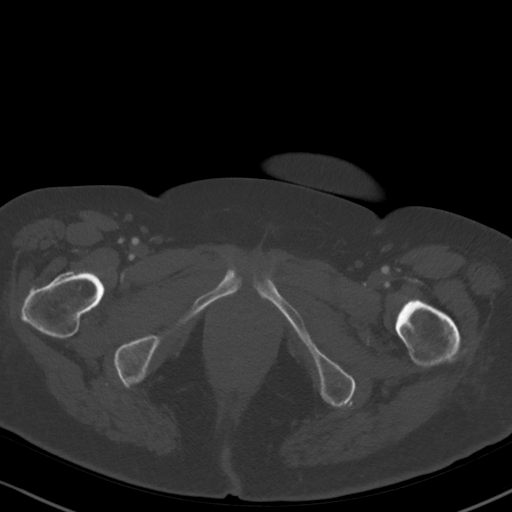
[im 44/216  soft-tissue]
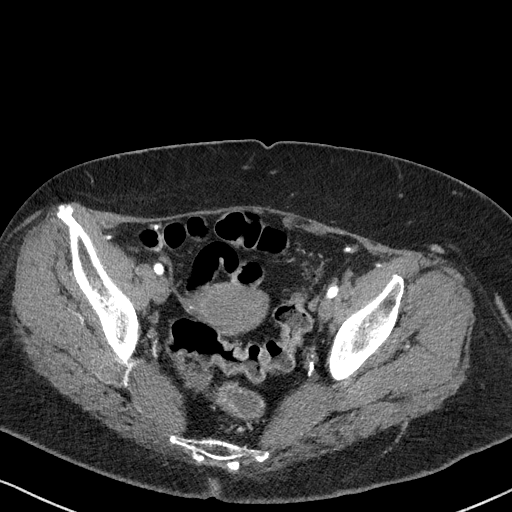
[im 58/216  soft-tissue]
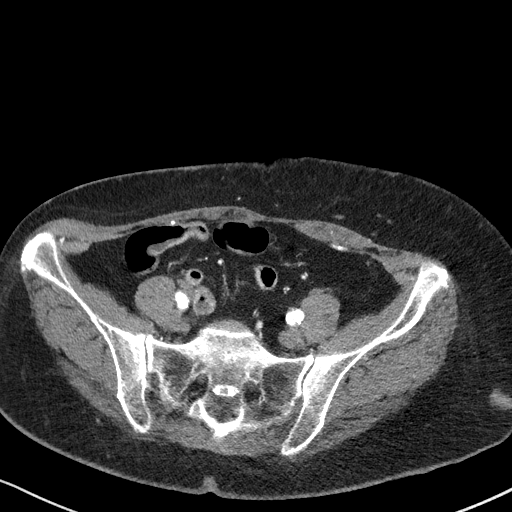
[im 72/216  soft-tissue]
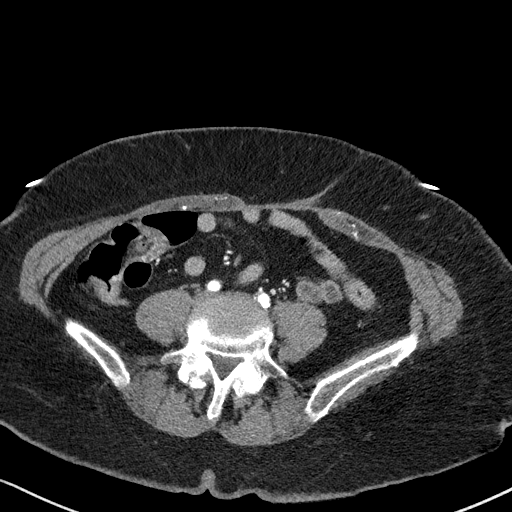
[im 101/216  soft-tissue]
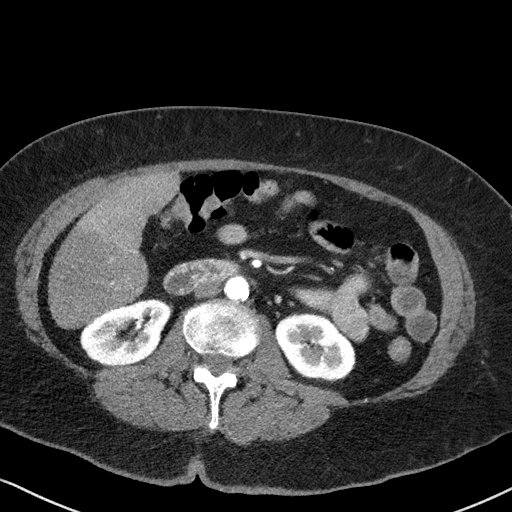
[im 115/216  soft-tissue]
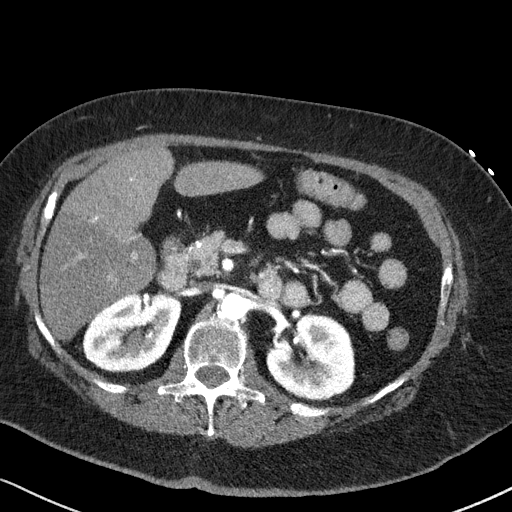
[im 144/216  soft-tissue]
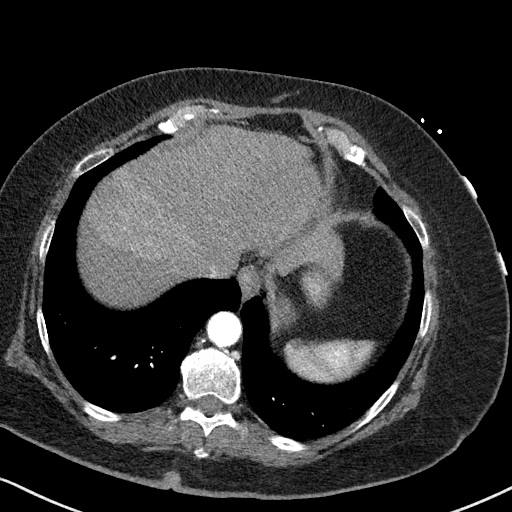
[im 158/216  soft-tissue]
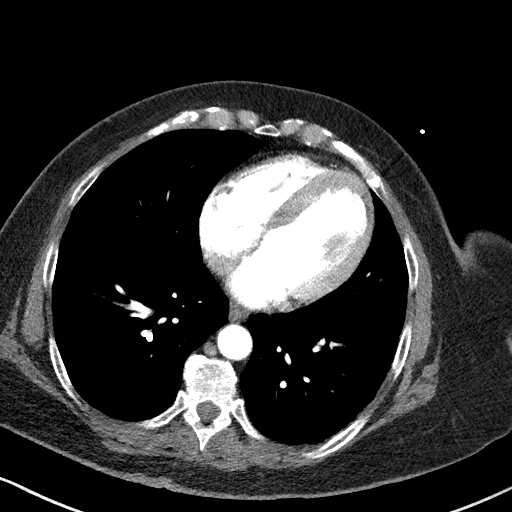
[im 173/216  soft-tissue]
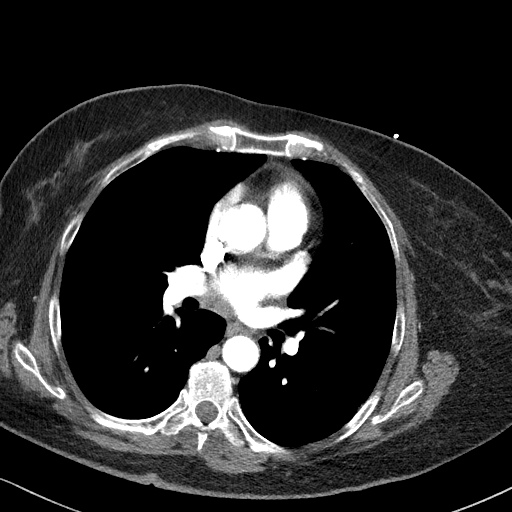
[im 173/216  bone]
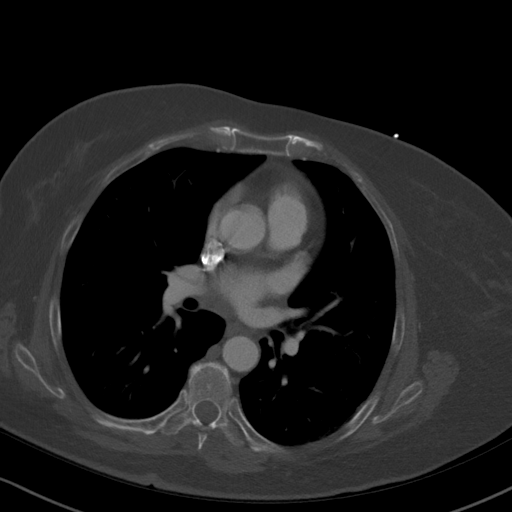
[im 201/216  soft-tissue]
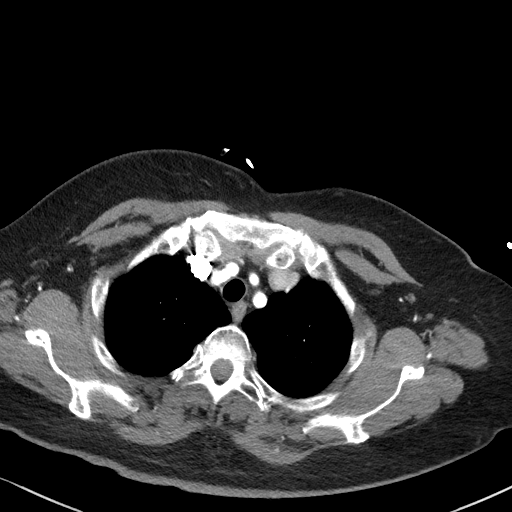

[Series 7: coronals · coronal · 0.73mm/px · 3 of 144 slices shown]
[im 36/144  soft-tissue]
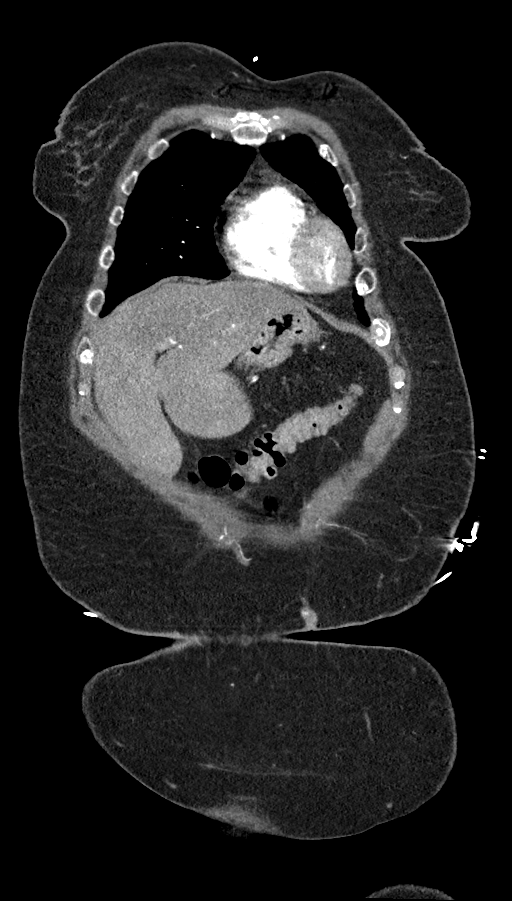
[im 72/144  soft-tissue]
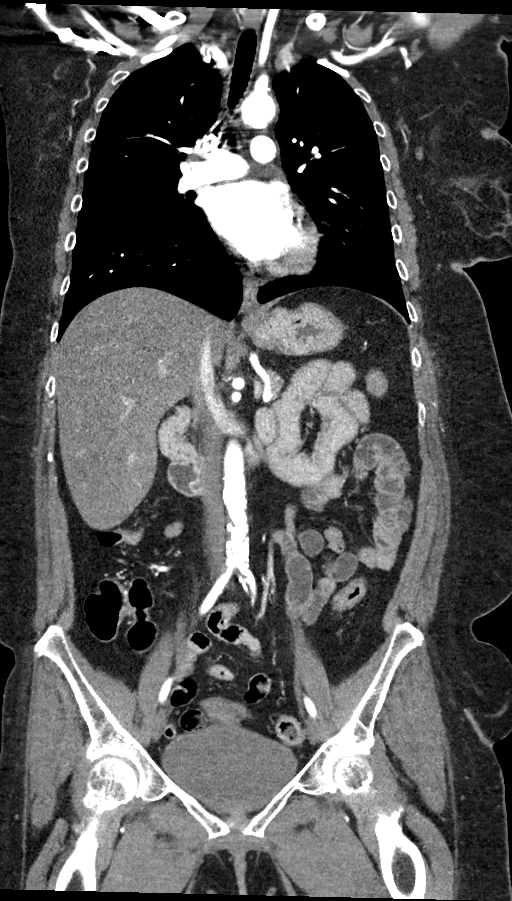
[im 108/144  soft-tissue]
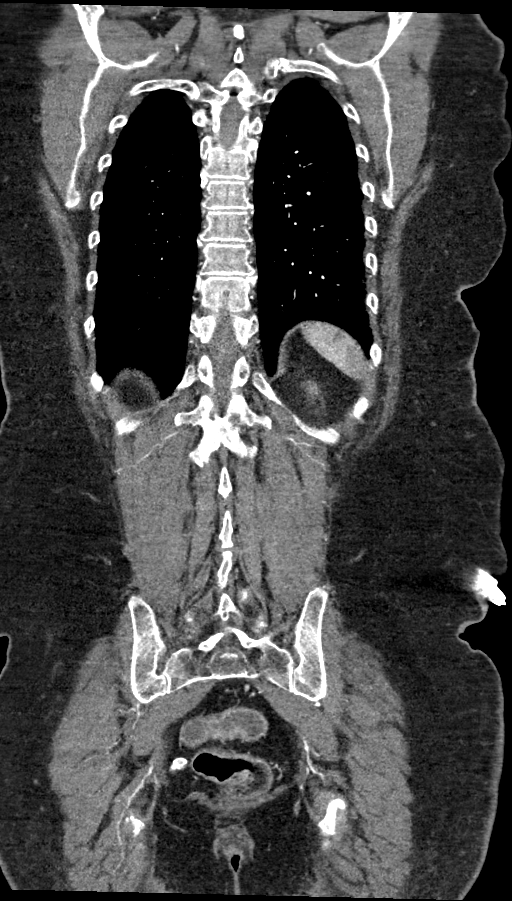

[13 of 46 positions shown; findings below may reference images not displayed]

FINDINGS: CTA CHEST FINDINGS

Cardiovascular: The heart is normal in size. No pericardial
effusion.

The aorta is normal in caliber. No dissection. Minimal scattered
atherosclerotic calcifications at the aortic arch and at the major
branch vessel ostia. Scattered coronary artery calcifications are
noted.

The pulmonary arteries appear normal. No pulmonary emboli are
identified.

Mediastinum/Nodes: No mediastinal or hilar mass or adenopathy. The
esophagus is grossly normal.

Lungs/Pleura: No acute pulmonary findings. No worrisome pulmonary
lesions.

Musculoskeletal: No significant bony findings.

Review of the MIP images confirms the above findings.

CTA ABDOMEN AND PELVIS FINDINGS

VASCULAR

Aorta: Advanced atherosclerotic calcifications for age, particular
involving the distal aorta with mild mural thrombus. No aneurysm or
dissection. The aortic branch vessels are patent.

Celiac: Widely patent.  No atherosclerotic calcifications.

SMA: Widely patent.  No atherosclerotic calcifications.

Renals: Widely patent.  No atherosclerotic calcifications.

IMA: Patent.

Inflow: Advanced atherosclerotic calcifications involving the iliac
arteries bilaterally, left greater than right. No aneurysm or
dissection.

Veins: Grossly normal.

Review of the MIP images confirms the above findings.

NON-VASCULAR

Hepatobiliary: No obvious hepatic lesions or biliary dilatation.
Perfusion abnormalities due to early phase arterial enhancement
makes with very early portal venous opacification. The gallbladder
is surgically absent. Mild associated common bile duct dilatation.

Pancreas: No mass, inflammation or ductal dilatation.

Spleen: Normal size.  No focal lesions.

Adrenals/Urinary Tract: The adrenal glands and kidneys are
unremarkable. Bladder is normal.

Stomach/Bowel: The stomach, duodenum, small bowel and colon are
unremarkable. No acute inflammatory changes, mass lesions or
obstructive findings. The terminal ileum is normal. The appendix is
normal.

Lymphatic: No mesenteric or retroperitoneal mass or adenopathy. No
pelvic adenopathy.

Reproductive: The uterus and ovaries are unremarkable.

Other: No pelvic mass or free pelvic fluid collections. No inguinal
mass or adenopathy.

Musculoskeletal: No significant findings. Mild compression deformity
of L5 of uncertain age.

Review of the MIP images confirms the above findings.
IMPRESSION: 1. Normal caliber of the thoracic aorta and no dissection. Scattered
atherosclerotic calcifications at the aortic arch.
2. Age advanced atherosclerotic calcifications involving the aorta
but the branch vessels are patent and there is no aneurysm or
dissection.
3. Status post cholecystectomy with mild associated biliary
dilatation.
4. No acute abdominal/pelvic findings, mass lesions or adenopathy.

## 2019-06-25 MED ORDER — ACETAMINOPHEN 325 MG PO TABS: 650.0000 mg | ORAL_TABLET | Freq: Four times a day (QID) | ORAL | Status: DC | PRN

## 2019-06-25 MED ORDER — ENOXAPARIN SODIUM 40 MG/0.4ML ~~LOC~~ SOLN
40.0000 mg | SUBCUTANEOUS | Status: DC
  Administered 2019-06-25: 40 mg via SUBCUTANEOUS
  Filled 2019-06-25 (×2): qty 0.4

## 2019-06-25 MED ORDER — ONDANSETRON HCL 4 MG PO TABS
4.0000 mg | ORAL_TABLET | Freq: Four times a day (QID) | ORAL | Status: DC | PRN
  Administered 2019-06-26: 4 mg via ORAL
  Filled 2019-06-25 (×2): qty 1

## 2019-06-25 MED ORDER — DIAZEPAM 5 MG PO TABS
5.0000 mg | ORAL_TABLET | Freq: Once | ORAL | Status: AC
  Administered 2019-06-25: 10:00:00 5 mg via ORAL
  Filled 2019-06-25: qty 1

## 2019-06-25 MED ORDER — PREDNISONE 20 MG PO TABS
60.0000 mg | ORAL_TABLET | Freq: Once | ORAL | Status: AC
  Administered 2019-06-25: 60 mg via ORAL
  Filled 2019-06-25: qty 3

## 2019-06-25 MED ORDER — ASPIRIN EC 81 MG PO TBEC
81.0000 mg | DELAYED_RELEASE_TABLET | Freq: Every day | ORAL | Status: DC
  Administered 2019-06-26: 81 mg via ORAL
  Filled 2019-06-25: qty 1

## 2019-06-25 MED ORDER — SODIUM CHLORIDE 0.9% FLUSH
3.0000 mL | Freq: Two times a day (BID) | INTRAVENOUS | Status: DC
  Administered 2019-06-25 – 2019-06-26 (×2): 3 mL via INTRAVENOUS

## 2019-06-25 MED ORDER — IBUPROFEN 400 MG PO TABS
400.0000 mg | ORAL_TABLET | Freq: Three times a day (TID) | ORAL | Status: DC
  Administered 2019-06-25 – 2019-06-26 (×3): 400 mg via ORAL
  Filled 2019-06-25 (×3): qty 1

## 2019-06-25 MED ORDER — DEXAMETHASONE SODIUM PHOSPHATE 10 MG/ML IJ SOLN
10.0000 mg | Freq: Once | INTRAMUSCULAR | Status: DC
  Filled 2019-06-25: qty 1

## 2019-06-25 MED ORDER — ACETAMINOPHEN 650 MG RE SUPP
650.0000 mg | Freq: Four times a day (QID) | RECTAL | Status: DC | PRN
  Filled 2019-06-25: qty 1

## 2019-06-25 MED ORDER — POLYETHYLENE GLYCOL 3350 17 G PO PACK
17.0000 g | PACK | Freq: Every day | ORAL | Status: DC | PRN
  Filled 2019-06-25: qty 1

## 2019-06-25 MED ORDER — ONDANSETRON 4 MG PO TBDP
4.0000 mg | ORAL_TABLET | Freq: Once | ORAL | Status: AC
  Administered 2019-06-25: 4 mg via ORAL
  Filled 2019-06-25: qty 1

## 2019-06-25 MED ORDER — HYDROMORPHONE HCL 1 MG/ML IJ SOLN
1.0000 mg | Freq: Once | INTRAMUSCULAR | Status: AC
  Administered 2019-06-25: 12:00:00 1 mg via INTRAVENOUS

## 2019-06-25 MED ORDER — IOHEXOL 350 MG/ML SOLN
100.0000 mL | Freq: Once | INTRAVENOUS | Status: AC | PRN
  Administered 2019-06-25: 100 mL via INTRAVENOUS

## 2019-06-25 MED ORDER — GABAPENTIN 300 MG PO CAPS
300.0000 mg | ORAL_CAPSULE | Freq: Two times a day (BID) | ORAL | Status: DC
  Administered 2019-06-25 – 2019-06-26 (×2): 300 mg via ORAL
  Filled 2019-06-25 (×3): qty 1

## 2019-06-25 MED ORDER — OXYCODONE-ACETAMINOPHEN 5-325 MG PO TABS
2.0000 | ORAL_TABLET | Freq: Once | ORAL | Status: AC
  Administered 2019-06-25: 2 via ORAL
  Filled 2019-06-25: qty 2

## 2019-06-25 MED ORDER — HEPARIN BOLUS VIA INFUSION
4000.0000 [IU] | Freq: Once | INTRAVENOUS | Status: DC
  Filled 2019-06-25: qty 4000

## 2019-06-25 MED ORDER — INSULIN ASPART 100 UNIT/ML ~~LOC~~ SOLN
0.0000 [IU] | Freq: Every day | SUBCUTANEOUS | Status: DC
  Administered 2019-06-25: 3 [IU] via SUBCUTANEOUS
  Filled 2019-06-25: qty 1

## 2019-06-25 MED ORDER — ALBUTEROL SULFATE (2.5 MG/3ML) 0.083% IN NEBU: 2.5000 mg | INHALATION_SOLUTION | RESPIRATORY_TRACT | Status: DC | PRN

## 2019-06-25 MED ORDER — ONDANSETRON HCL 4 MG/2ML IJ SOLN
4.0000 mg | Freq: Four times a day (QID) | INTRAMUSCULAR | Status: DC | PRN
  Administered 2019-06-26: 4 mg via INTRAVENOUS
  Filled 2019-06-25: qty 2

## 2019-06-25 MED ORDER — HYDROMORPHONE HCL 2 MG PO TABS: 1.0000 mg | ORAL_TABLET | Freq: Once | ORAL | Status: DC

## 2019-06-25 MED ORDER — ASPIRIN 81 MG PO CHEW
324.0000 mg | CHEWABLE_TABLET | Freq: Once | ORAL | Status: AC
  Administered 2019-06-25: 324 mg via ORAL
  Filled 2019-06-25: qty 4

## 2019-06-25 MED ORDER — HYDROMORPHONE HCL 1 MG/ML IJ SOLN
0.5000 mg | Freq: Once | INTRAMUSCULAR | Status: DC
  Filled 2019-06-25 (×2): qty 1

## 2019-06-25 MED ORDER — INSULIN ASPART 100 UNIT/ML ~~LOC~~ SOLN
0.0000 [IU] | Freq: Three times a day (TID) | SUBCUTANEOUS | Status: DC
  Administered 2019-06-25: 18:00:00 11 [IU] via SUBCUTANEOUS
  Administered 2019-06-26: 5 [IU] via SUBCUTANEOUS
  Filled 2019-06-25 (×2): qty 1

## 2019-06-25 MED ORDER — HYDROMORPHONE HCL 1 MG/ML IJ SOLN
1.0000 mg | INTRAMUSCULAR | Status: DC | PRN
  Administered 2019-06-25 – 2019-06-26 (×3): 1 mg via INTRAVENOUS
  Filled 2019-06-25 (×3): qty 1

## 2019-06-25 MED ORDER — OXYCODONE-ACETAMINOPHEN 5-325 MG PO TABS
1.0000 | ORAL_TABLET | ORAL | Status: DC | PRN
  Administered 2019-06-25 – 2019-06-26 (×4): 1 via ORAL
  Filled 2019-06-25 (×4): qty 1

## 2019-06-25 MED ORDER — HEPARIN (PORCINE) 25000 UT/250ML-% IV SOLN: 800.0000 [IU]/h | INTRAVENOUS | Status: DC

## 2019-06-25 MED ORDER — HYDROMORPHONE HCL 1 MG/ML IJ SOLN
0.5000 mg | Freq: Once | INTRAMUSCULAR | Status: AC
  Administered 2019-06-25: 0.5 mg via INTRAVENOUS
  Filled 2019-06-25: qty 1

## 2019-06-25 NOTE — ED Notes (Signed)
Report called to tammy rn floor nurse 

## 2019-06-25 NOTE — H&P (Signed)
[facility] [place] NAME: Veronica Murray    MR#:  [id number]  DATE OF BIRTH:  [birth year]  DATE OF ADMISSION:  06/25/2019  PRIMARY CARE PHYSICIAN: System, Pcp Not In   REQUESTING/REFERRING PHYSICIAN:   CHIEF COMPLAINT:   Chief Complaint  Patient presents with  . Arm Pain  . Emesis    HISTORY OF PRESENT ILLNESS:  Veronica Murray  is a 63 y.o. female with a known history of diabetes mellitus, fibromyalgia presents to the emergency room complaining of 3 days of right arm pain.  This is mainly over her triceps area and seems to start in the shoulder area and radiates down.  This is not tender.  She feels better when she lifts her arm over her head.  More pain when she hangs it down with gravity.  No swelling or redness.  No chest pain or shortness of breath.  She has had some nausea and vomiting which is now resolved.  No diarrhea.  No abdominal pain.  Here in the emergency room she was found to have elevated troponin of 187 followed by 177.  EKG showed nothing acute.  CT scan of the chest did not show any dissection. She has required multiple doses of Dilaudid.  She is allergic to Toradol and codeine.  PAST MEDICAL HISTORY:   Past Medical History:  Diagnosis Date  . Diabetes mellitus without complication ([place])     PAST SURGICAL HISTORY:  History reviewed. No pertinent surgical history.  SOCIAL HISTORY:   Social History   Tobacco Use  . Smoking status: Current Every Day Smoker    Packs/day: 1.00  Substance Use Topics  . Alcohol use: Not Currently    FAMILY HISTORY:  No family history on file.  DRUG ALLERGIES:   Allergies  Allergen Reactions  . Codeine Hives    REVIEW OF SYSTEMS:   Review of Systems  Constitutional: Negative for chills and fever.  HENT: Negative for sore throat.   Eyes: Negative for blurred vision, double vision and pain.  Respiratory: Negative for cough, hemoptysis, shortness of breath and wheezing.    Cardiovascular: Negative for chest pain, palpitations, orthopnea and leg swelling.  Gastrointestinal: Negative for abdominal pain, constipation, diarrhea, heartburn, nausea and vomiting.  Genitourinary: Negative for dysuria and hematuria.  Musculoskeletal: Positive for joint pain. Negative for back pain.  Skin: Negative for rash.  Neurological: Negative for sensory change, speech change, focal weakness and headaches.  Endo/Heme/Allergies: Does not bruise/bleed easily.  Psychiatric/Behavioral: Negative for depression. The patient is not nervous/anxious.     MEDICATIONS AT HOME:   Prior to Admission medications   Not on File     VITAL SIGNS:  Blood pressure 100/84, pulse 86, temperature 97.7 F (36.5 C), temperature source Oral, resp. rate (!) 25, height 5\' 2"  (1.575 m), weight 77.1 kg, SpO2 96 %.  PHYSICAL EXAMINATION:  Physical Exam  GENERAL:  63 y.o.-year-old patient lying in the bed with no acute distress.  EYES: Pupils equal, round, reactive to light and accommodation. No scleral icterus. Extraocular muscles intact.  HEENT: Head atraumatic, normocephalic. Oropharynx and nasopharynx clear. No oropharyngeal erythema, moist oral mucosa  NECK:  Supple, no jugular venous distention. No thyroid enlargement, no tenderness.  LUNGS: Normal breath sounds bilaterally, no wheezing, rales, rhonchi. No use of accessory muscles of respiration.  CARDIOVASCULAR: S1, S2 normal. No murmurs, rubs, or gallops.  ABDOMEN: Soft, nontender, nondistended. Bowel sounds present. No organomegaly or mass.  EXTREMITIES: No pedal edema,  cyanosis, or clubbing. + 2 pedal & radial pulses b/l.   NEUROLOGIC: Cranial nerves II through XII are intact. No focal Motor or sensory deficits appreciated b/l PSYCHIATRIC: The patient is alert and oriented x 3. Good affect.  SKIN: No obvious rash, lesion, or ulcer.   LABORATORY PANEL:   CBC Recent Labs  Lab 06/25/19 1015  WBC 9.1  HGB 15.4*  HCT 44.5  PLT 385    ------------------------------------------------------------------------------------------------------------------  Chemistries  Recent Labs  Lab 06/25/19 1015 06/25/19 1236  NA 133*  --   K 4.0  --   CL 97*  --   CO2 23  --   GLUCOSE 253*  --   BUN 23  --   CREATININE 0.72  --   CALCIUM 9.7  --   AST  --  28  ALT  --  19  ALKPHOS  --  90  BILITOT  --  1.2   ------------------------------------------------------------------------------------------------------------------  Cardiac Enzymes No results for input(s): TROPONINI in the last 168 hours. ------------------------------------------------------------------------------------------------------------------  RADIOLOGY:  Dg Chest Port 1 View  Result Date: 06/25/2019 CLINICAL DATA:  Chest pain. EXAM: PORTABLE CHEST 1 VIEW COMPARISON:  None. FINDINGS: The heart size and mediastinal contours are within normal limits. Both lungs are clear. No pneumothorax or pleural effusion is noted. The visualized skeletal structures are unremarkable. IMPRESSION: No active disease. Electronically Signed   By: Lupita RaiderJames  Green Jr M.D.   On: 06/25/2019 09:48   Ct Angio Chest/abd/pel For Dissection W And/or Wo Contrast  Result Date: 06/25/2019 CLINICAL DATA:  Chest and back pain four days. EXAM: CT ANGIOGRAPHY CHEST, ABDOMEN AND PELVIS TECHNIQUE: Multidetector CT imaging through the chest, abdomen and pelvis was performed using the standard protocol during bolus administration of intravenous contrast. Multiplanar reconstructed images and MIPs were obtained and reviewed to evaluate the vascular anatomy. CONTRAST:  100mL OMNIPAQUE IOHEXOL 350 MG/ML SOLN COMPARISON:  None. FINDINGS: CTA CHEST FINDINGS Cardiovascular: The heart is normal in size. No pericardial effusion. The aorta is normal in caliber. No dissection. Minimal scattered atherosclerotic calcifications at the aortic arch and at the major branch vessel ostia. Scattered coronary artery  calcifications are noted. The pulmonary arteries appear normal. No pulmonary emboli are identified. Mediastinum/Nodes: No mediastinal or hilar mass or adenopathy. The esophagus is grossly normal. Lungs/Pleura: No acute pulmonary findings. No worrisome pulmonary lesions. Musculoskeletal: No significant bony findings. Review of the MIP images confirms the above findings. CTA ABDOMEN AND PELVIS FINDINGS VASCULAR Aorta: Advanced atherosclerotic calcifications for age, particular involving the distal aorta with mild mural thrombus. No aneurysm or dissection. The aortic branch vessels are patent. Celiac: Widely patent.  No atherosclerotic calcifications. SMA: Widely patent.  No atherosclerotic calcifications. Renals: Widely patent.  No atherosclerotic calcifications. IMA: Patent. Inflow: Advanced atherosclerotic calcifications involving the iliac arteries bilaterally, left greater than right. No aneurysm or dissection. Veins: Grossly normal. Review of the MIP images confirms the above findings. NON-VASCULAR Hepatobiliary: No obvious hepatic lesions or biliary dilatation. Perfusion abnormalities due to early phase arterial enhancement makes with very early portal venous opacification. The gallbladder is surgically absent. Mild associated common bile duct dilatation. Pancreas: No mass, inflammation or ductal dilatation. Spleen: Normal size.  No focal lesions. Adrenals/Urinary Tract: The adrenal glands and kidneys are unremarkable. Bladder is normal. Stomach/Bowel: The stomach, duodenum, small bowel and colon are unremarkable. No acute inflammatory changes, mass lesions or obstructive findings. The terminal ileum is normal. The appendix is normal. Lymphatic: No mesenteric or retroperitoneal mass or adenopathy. No pelvic adenopathy. Reproductive:  The uterus and ovaries are unremarkable. Other: No pelvic mass or free pelvic fluid collections. No inguinal mass or adenopathy. Musculoskeletal: No significant findings. Mild  compression deformity of L5 of uncertain age. Review of the MIP images confirms the above findings. IMPRESSION: 1. Normal caliber of the thoracic aorta and no dissection. Scattered atherosclerotic calcifications at the aortic arch. 2. Age advanced atherosclerotic calcifications involving the aorta but the branch vessels are patent and there is no aneurysm or dissection. 3. Status post cholecystectomy with mild associated biliary dilatation. 4. No acute abdominal/pelvic findings, mass lesions or adenopathy. Electronically Signed   By: Rudie MeyerP.  Gallerani M.D.   On: 06/25/2019 12:35     IMPRESSION AND PLAN:   *Right arm pain.  This seems to be musculoskeletal.  She had similar episode in the past where bursitis was diagnosed.  We will start her on ibuprofen scheduled.  Also ordered pain medications PRN.  Will check an ultrasound to rule out DVT.  *Elevated troponin.  Etiology unclear but only minimal elevation and repeat is trending down.  I do not think this is MI.  EKG shows nothing acute.  Patient has no chest pain or shortness of breath.  We will keep her on telemetry monitoring.  Ordered a stress test for morning.  Also cardiology consultation.  Discussed with Dr. Mariah MillingGollan.  *Diabetes mellitus.  Sliding scale insulin and diabetic diet  *Fibromyalgia.  Continue medications once home medication list available  DVT prophylaxis with Lovenox  All the records are reviewed and case discussed with ED provider. Management plans discussed with the patient, family and they are in agreement.  CODE STATUS: Full code  TOTAL TIME TAKING CARE OF THIS PATIENT: 40 minutes.   Orie FishermanSrikar R Ambree Frances M.D on 06/25/2019 at 3:28 PM  Between 7am to 6pm - Pager - [phone]  After 6pm go to [email or web address] - password [email or web address]  [facility]  Office  [phone]  CC: Primary care physician; System, Pcp Not In  Note: This dictation was prepared with Dragon dictation along with smaller phrase technology.  Any transcriptional errors that result from this process are unintentional.

## 2019-06-25 NOTE — ED Notes (Signed)
fsbs [id number]

## 2019-06-25 NOTE — Consult Note (Signed)
ANTICOAGULATION CONSULT NOTE - Initial Consult  Pharmacy Consult for Heparin Drip Indication: chest pain/ACS/STEMI  Allergies  Allergen Reactions  . Codeine Hives    Patient Measurements: Height: 5\' 2"  (157.5 cm) Weight: 170 lb (77.1 kg) IBW/kg (Calculated) : 50.1 Heparin Dosing Weight: 67kg  Vital Signs: Temp: 97.7 F (36.5 C) (08/14 0852) Temp Source: Oral (08/14 0852) BP: 123/83 (08/14 1100) Pulse Rate: 99 (08/14 1100)  Labs: Recent Labs    06/25/19 1015  HGB 15.4*  HCT 44.5  PLT 385  CREATININE 0.72  TROPONINIHS 187*    Estimated Creatinine Clearance: 71 mL/min (by C-G formula based on SCr of 0.72 mg/dL).   Medical History: Past Medical History:  Diagnosis Date  . Diabetes mellitus without complication ([place])     Medications:  No PTA anticoagulants recorded  Assessment: 63 yo female with R arm pain and elevated troponin - pharmacy will initiate and monitor heparin drip  Goal of Therapy:  Heparin level 0.3-0.7 units/ml Monitor platelets by anticoagulation protocol: Yes   Plan:  - Will give 4000 unit heparin bolus, followed by 800 units/hour  - Will check Heparin level (HL) in 6 hours per protocol  Lu Duffel, PharmD, BCPS Clinical Pharmacist 06/25/2019 12:59 PM

## 2019-06-25 NOTE — ED Triage Notes (Signed)
Right arm pain X 4 days upon awakening, reports she sleeps on right arm. Has had nausea and emesis since pain began, believes related to pain as she has had no abdominal pain. No injury to arm, pain lessens when elevated. Pt wiggling around wheelchair unable to sit still for vitals.

## 2019-06-25 NOTE — ED Notes (Signed)
ED TO INPATIENT HANDOFF REPORT  ED Nurse Name and Phone #: Jonanthan Bolender S Name/Age/Gender Veronica HeimlichMargaret M Murray 63 y.o. female Room/Bed: ED33A/ED33A  Code Status   Code Status: Full Code  Home/SNF/Other Home Patient oriented to: self, place, time and situation Is this baseline? Yes   Triage Complete: Triage complete  Chief Complaint rt arm pain/vomiting  Triage Note Right arm pain X 4 days upon awakening, reports she sleeps on right arm. Has had nausea and emesis since pain began, believes related to pain as she has had no abdominal pain. No injury to arm, pain lessens when elevated. Pt wiggling around wheelchair unable to sit still for vitals.   Allergies Allergies  Allergen Reactions  . Codeine Hives    Level of Care/Admitting Diagnosis ED Disposition    ED Disposition Condition Comment   Admit  Hospital Area: [facility] [[id number]]  Level of Care: Telemetry [5]  Covid Evaluation: Asymptomatic Screening Protocol (No Symptoms)  Diagnosis: Troponin I above reference range [409811][[id number]]  Admitting Physician: Milagros LollSUDINI, SRIKAR [[id number]]  Attending Physician: Milagros LollSUDINI, SRIKAR [[id number]]  PT Class (Do Not Modify): Observation [104]  PT Acc Code (Do Not Modify): Observation [10022]       B Medical/Surgery History Past Medical History:  Diagnosis Date  . Diabetes mellitus without complication (HCC)    History reviewed. No pertinent surgical history.   A IV Location/Drains/Wounds Patient Lines/Drains/Airways Status   Active Line/Drains/Airways    Name:   Placement date:   Placement time:   Site:   Days:   Peripheral IV 06/25/19 Right Arm   06/25/19    1149    Arm   less than 1   Peripheral IV 06/25/19 Left Wrist   06/25/19    1432    Wrist   less than 1          Intake/Output Last 24 hours No intake or output data in the 24 hours ending 06/25/19 1812  Labs/Imaging Results for orders placed or performed during the hospital encounter of 06/25/19 (from the past 48  hour(s))  Basic metabolic panel     Status: Abnormal   Collection Time: 06/25/19 10:15 AM  Result Value Ref Range   Sodium 133 (L) 135 - 145 mmol/L   Potassium 4.0 3.5 - 5.1 mmol/L   Chloride 97 (L) 98 - 111 mmol/L   CO2 23 22 - 32 mmol/L   Glucose, Bld 253 (H) 70 - 99 mg/dL   BUN 23 8 - 23 mg/dL   Creatinine, Ser 0.860.72 0.44 - 1.00 mg/dL   Calcium 9.7 8.9 - 57.810.3 mg/dL   GFR calc non Af Amer >60 >60 mL/min   GFR calc Af Amer >60 >60 mL/min   Anion gap 13 5 - 15    Comment: Performed at [facility], [address] [place], [place] [address]  CBC     Status: Abnormal   Collection Time: 06/25/19 10:15 AM  Result Value Ref Range   WBC 9.1 4.0 - 10.5 K/uL   RBC 5.03 3.87 - 5.11 MIL/uL   Hemoglobin 15.4 (H) 12.0 - 15.0 g/dL   HCT 95.244.5 84.136.0 - 32.446.0 %   MCV 88.5 80.0 - 100.0 fL   MCH 30.6 26.0 - 34.0 pg   MCHC 34.6 30.0 - 36.0 g/dL   RDW 40.113.4 02.711.5 - 25.315.5 %   Platelets 385 150 - 400 K/uL   nRBC 0.0 0.0 - 0.2 %    Comment: Performed at [facility], [address] [place], [place] [address]  Troponin I (High Sensitivity)     Status: Abnormal   Collection Time: 06/25/19 10:15 AM  Result Value Ref Range   Troponin I (High Sensitivity) 187 (HH) <18 ng/L    Comment: CRITICAL RESULT CALLED TO, READ BACK BY AND VERIFIED WITH JENNIFER WHITLEY @1104  ON 06/25/2019 BY FMW (NOTE) Elevated high sensitivity troponin I (hsTnI) values and significant  changes across serial measurements may suggest ACS but many other  chronic and acute conditions are known to elevate hsTnI results.  Refer to the "Links" section for chest pain algorithms and additional  guidance. Performed at [facility], [address] [place], [place] [address]   Troponin I (High Sensitivity)     Status: Abnormal   Collection Time: 06/25/19 12:26 PM  Result Value Ref Range   Troponin I (High Sensitivity) 171 (HH) <18 ng/L    Comment: CRITICAL VALUE NOTED. VALUE IS CONSISTENT WITH PREVIOUSLY  REPORTED/CALLED VALUE / MLK (NOTE) Elevated high sensitivity troponin I (hsTnI) values and significant  changes across serial measurements may suggest ACS but many other  chronic and acute conditions are known to elevate hsTnI results.  Refer to the "Links" section for chest pain algorithms and additional  guidance. Performed at [facility], [address] [place], [place] [address]   Hepatic function panel     Status: Abnormal   Collection Time: 06/25/19 12:36 PM  Result Value Ref Range   Total Protein 6.5 6.5 - 8.1 g/dL   Albumin 3.4 (L) 3.5 - 5.0 g/dL   AST 28 15 - 41 U/L    Comment: HEMOLYSIS AT THIS LEVEL MAY AFFECT RESULT   ALT 19 0 - 44 U/L    Comment: HEMOLYSIS AT THIS LEVEL MAY AFFECT RESULT   Alkaline Phosphatase 90 38 - 126 U/L   Total Bilirubin 1.2 0.3 - 1.2 mg/dL    Comment: HEMOLYSIS AT THIS LEVEL MAY AFFECT RESULT   Bilirubin, Direct 0.4 (H) 0.0 - 0.2 mg/dL    Comment: HEMOLYSIS AT THIS LEVEL MAY AFFECT RESULT   Indirect Bilirubin 0.8 0.3 - 0.9 mg/dL    Comment: Performed at [facility], [address] [place], [place] [address]  CK     Status: None   Collection Time: 06/25/19 12:36 PM  Result Value Ref Range   Total CK 100 38 - 234 U/L    Comment: HEMOLYSIS AT THIS LEVEL MAY AFFECT RESULT Performed at [facility], [address] [place], [place] [address]   APTT     Status: None   Collection Time: 06/25/19  2:31 PM  Result Value Ref Range   aPTT 25 24 - 36 seconds    Comment: Performed at [facility], [address] [place], [place] [address]  Protime-INR     Status: None   Collection Time: 06/25/19  2:31 PM  Result Value Ref Range   Prothrombin Time 12.5 11.4 - 15.2 seconds   INR 0.9 0.8 - 1.2    Comment: (NOTE) INR goal varies based on device and disease states. Performed at [facility], [address] [place], [place] [address]   SARS Coronavirus 2 [facility](Hospital order, Performed in [facility]  hospital lab) Nasopharyngeal Nasopharyngeal Swab     Status: None   Collection Time: 06/25/19  4:00 PM   Specimen: Nasopharyngeal Swab  Result Value Ref Range   SARS Coronavirus 2 NEGATIVE NEGATIVE    Comment: (NOTE) If result is NEGATIVE SARS-CoV-2 target  nucleic acids are NOT DETECTED. The SARS-CoV-2 RNA is generally detectable in upper and lower  respiratory specimens during the acute phase of infection. The lowest  concentration of SARS-CoV-2 viral copies this assay can detect is 250  copies / mL. A negative result does not preclude SARS-CoV-2 infection  and should not be used as the sole basis for treatment or other  patient management decisions.  A negative result may occur with  improper specimen collection / handling, submission of specimen other  than nasopharyngeal swab, presence of viral mutation(s) within the  areas targeted by this assay, and inadequate number of viral copies  (<250 copies / mL). A negative result must be combined with clinical  observations, patient history, and epidemiological information. If result is POSITIVE SARS-CoV-2 target nucleic acids are DETECTED. The SARS-CoV-2 RNA is generally detectable in upper and lower  respiratory specimens dur ing the acute phase of infection.  Positive  results are indicative of active infection with SARS-CoV-2.  Clinical  correlation with patient history and other diagnostic information is  necessary to determine patient infection status.  Positive results do  not rule out bacterial infection or co-infection with other viruses. If result is PRESUMPTIVE POSTIVE SARS-CoV-2 nucleic acids MAY BE PRESENT.   A presumptive positive result was obtained on the submitted specimen  and confirmed on repeat testing.  While 2019 novel coronavirus  (SARS-CoV-2) nucleic acids may be present in the submitted sample  additional confirmatory testing may be necessary for epidemiological  and / or clinical management purposes  to  differentiate between  SARS-CoV-2 and other Sarbecovirus currently known to infect humans.  If clinically indicated additional testing with an alternate test  methodology [phone]([id number]) is advised. The SARS-CoV-2 RNA is generally  detectable in upper and lower respiratory sp ecimens during the acute  phase of infection. The expected result is Negative. Fact Sheet for Patients:  BoilerBrush.com.cyhttps://www.fda.gov/media/136312/download Fact Sheet for Healthcare Providers: https://pope.com/https://www.fda.gov/media/136313/download This test is not yet approved or cleared by the [place] FDA and has been authorized for detection and/or diagnosis of SARS-CoV-2 by FDA under an Emergency Use Authorization (EUA).  This EUA will remain in effect (meaning this test can be used) for the duration of the COVID-19 declaration under Section 564(b)(1) of the Act, 21 U.S.C. section 360bbb-3(b)(1), unless the authorization is terminated or revoked sooner. Performed at [facility], [address] [place], [place] [address]   Glucose, capillary     Status: Abnormal   Collection Time: 06/25/19  5:45 PM  Result Value Ref Range   Glucose-Capillary 311 (H) 70 - 99 mg/dL   Dg Chest Port 1 View  Result Date: 06/25/2019 CLINICAL DATA:  Chest pain. EXAM: PORTABLE CHEST 1 VIEW COMPARISON:  None. FINDINGS: The heart size and mediastinal contours are within normal limits. Both lungs are clear. No pneumothorax or pleural effusion is noted. The visualized skeletal structures are unremarkable. IMPRESSION: No active disease. Electronically Signed   By: Lupita RaiderJames  Green Jr M.D.   On: 06/25/2019 09:48   Ct Angio Chest/abd/pel For Dissection W And/or Wo Contrast  Result Date: 06/25/2019 CLINICAL DATA:  Chest and back pain four days. EXAM: CT ANGIOGRAPHY CHEST, ABDOMEN AND PELVIS TECHNIQUE: Multidetector CT imaging through the chest, abdomen and pelvis was performed using the standard protocol during bolus administration of intravenous contrast.  Multiplanar reconstructed images and MIPs were obtained and reviewed to evaluate the vascular anatomy. CONTRAST:  100mL OMNIPAQUE IOHEXOL 350 MG/ML SOLN COMPARISON:  None. FINDINGS: CTA CHEST FINDINGS Cardiovascular: The  heart is normal in size. No pericardial effusion. The aorta is normal in caliber. No dissection. Minimal scattered atherosclerotic calcifications at the aortic arch and at the major branch vessel ostia. Scattered coronary artery calcifications are noted. The pulmonary arteries appear normal. No pulmonary emboli are identified. Mediastinum/Nodes: No mediastinal or hilar mass or adenopathy. The esophagus is grossly normal. Lungs/Pleura: No acute pulmonary findings. No worrisome pulmonary lesions. Musculoskeletal: No significant bony findings. Review of the MIP images confirms the above findings. CTA ABDOMEN AND PELVIS FINDINGS VASCULAR Aorta: Advanced atherosclerotic calcifications for age, particular involving the distal aorta with mild mural thrombus. No aneurysm or dissection. The aortic branch vessels are patent. Celiac: Widely patent.  No atherosclerotic calcifications. SMA: Widely patent.  No atherosclerotic calcifications. Renals: Widely patent.  No atherosclerotic calcifications. IMA: Patent. Inflow: Advanced atherosclerotic calcifications involving the iliac arteries bilaterally, left greater than right. No aneurysm or dissection. Veins: Grossly normal. Review of the MIP images confirms the above findings. NON-VASCULAR Hepatobiliary: No obvious hepatic lesions or biliary dilatation. Perfusion abnormalities due to early phase arterial enhancement makes with very early portal venous opacification. The gallbladder is surgically absent. Mild associated common bile duct dilatation. Pancreas: No mass, inflammation or ductal dilatation. Spleen: Normal size.  No focal lesions. Adrenals/Urinary Tract: The adrenal glands and kidneys are unremarkable. Bladder is normal. Stomach/Bowel: The stomach,  duodenum, small bowel and colon are unremarkable. No acute inflammatory changes, mass lesions or obstructive findings. The terminal ileum is normal. The appendix is normal. Lymphatic: No mesenteric or retroperitoneal mass or adenopathy. No pelvic adenopathy. Reproductive: The uterus and ovaries are unremarkable. Other: No pelvic mass or free pelvic fluid collections. No inguinal mass or adenopathy. Musculoskeletal: No significant findings. Mild compression deformity of L5 of uncertain age. Review of the MIP images confirms the above findings. IMPRESSION: 1. Normal caliber of the thoracic aorta and no dissection. Scattered atherosclerotic calcifications at the aortic arch. 2. Age advanced atherosclerotic calcifications involving the aorta but the branch vessels are patent and there is no aneurysm or dissection. 3. Status post cholecystectomy with mild associated biliary dilatation. 4. No acute abdominal/pelvic findings, mass lesions or adenopathy. Electronically Signed   By: Rudie Meyer M.D.   On: 06/25/2019 12:35    Pending Labs Unresulted Labs (From admission, onward)    Start     Ordered   07/02/19 0500  Creatinine, serum  (enoxaparin (LOVENOX)    CrCl >/= 30 ml/min)  Weekly,   STAT    Comments: while on enoxaparin therapy    06/25/19 1526   06/26/19 0500  Basic metabolic panel  Tomorrow morning,   STAT     06/25/19 1526   06/26/19 0500  CBC  Tomorrow morning,   STAT     06/25/19 1526   06/25/19 1900  Heparin level (unfractionated)  Once-Timed,   STAT     06/25/19 1255   06/25/19 1527  Hemoglobin A1c  Add-on,   AD    Comments: To assess prior glycemic control    06/25/19 1526   06/25/19 1525  HIV antibody (Routine Testing)  Add-on,   AD     06/25/19 1526          Vitals/Pain Today's Vitals   06/25/19 1415 06/25/19 1514 06/25/19 1559 06/25/19 1726  BP:  100/84  129/72  Pulse: 87 86  85  Resp: 12 (!) 25  18  Temp:      TempSrc:      SpO2: 98% 96%  98%  Weight:  Height:       PainSc:   6      Isolation Precautions No active isolations  Medications Medications  HYDROmorphone (DILAUDID) injection 0.5 mg (0.5 mg Intravenous Not Given 06/25/19 0959)  enoxaparin (LOVENOX) injection 40 mg (has no administration in time range)  sodium chloride flush (NS) 0.9 % injection 3 mL (has no administration in time range)  acetaminophen (TYLENOL) tablet 650 mg (has no administration in time range)    Or  acetaminophen (TYLENOL) suppository 650 mg (has no administration in time range)  polyethylene glycol (MIRALAX / GLYCOLAX) packet 17 g (has no administration in time range)  ondansetron (ZOFRAN) tablet 4 mg (has no administration in time range)    Or  ondansetron (ZOFRAN) injection 4 mg (has no administration in time range)  aspirin EC tablet 81 mg (has no administration in time range)  albuterol (PROVENTIL) (2.5 MG/3ML) 0.083% nebulizer solution 2.5 mg (has no administration in time range)  insulin aspart (novoLOG) injection 0-15 Units (11 Units Subcutaneous Given 06/25/19 1801)  insulin aspart (novoLOG) injection 0-5 Units (has no administration in time range)  HYDROmorphone (DILAUDID) injection 1 mg (has no administration in time range)  oxyCODONE-acetaminophen (PERCOCET/ROXICET) 5-325 MG per tablet 1 tablet (has no administration in time range)  ibuprofen (ADVIL) tablet 400 mg (400 mg Oral Given 06/25/19 1737)  gabapentin (NEURONTIN) capsule 300 mg (300 mg Oral Given 06/25/19 1737)  oxyCODONE-acetaminophen (PERCOCET/ROXICET) 5-325 MG per tablet 2 tablet (2 tablets Oral Given 06/25/19 1010)  ondansetron (ZOFRAN-ODT) disintegrating tablet 4 mg (4 mg Oral Given 06/25/19 1013)  diazepam (VALIUM) tablet 5 mg (5 mg Oral Given 06/25/19 1014)  predniSONE (DELTASONE) tablet 60 mg (60 mg Oral Given 06/25/19 1012)  iohexol (OMNIPAQUE) 350 MG/ML injection 100 mL (100 mLs Intravenous Contrast Given 06/25/19 1204)  HYDROmorphone (DILAUDID) injection 1 mg (1 mg Intravenous Given 06/25/19  1201)  aspirin chewable tablet 324 mg (324 mg Oral Given 06/25/19 1436)  HYDROmorphone (DILAUDID) injection 0.5 mg (0.5 mg Intravenous Given 06/25/19 1437)    Mobility walks Low fall risk   Focused Assessments Cardiac Assessment Handoff:  Cardiac Rhythm: Sinus tachycardia Lab Results  Component Value Date   CKTOTAL 100 06/25/2019   No results found for: DDIMER Does the Patient currently have chest pain? No     R Recommendations: See Admitting Provider Note  Report given to:   Additional Notes: none

## 2019-06-25 NOTE — ED Notes (Signed)
Date and time results received: 06/25/19 11:05 AM    Test: Troponin Critical Value: 187  Name of Provider Notified: Dr. Jimmye Norman

## 2019-06-25 NOTE — ED Notes (Signed)
Pt complains of extreme right arm pain x4 days ago and n/v x3 days ago. No unusual diet the day n/v began, but the pt has not eaten since and has had "very little to drink". Pt tearful and states pain 10 out of 10, with and without palpation. Pulses and cap refill good on both upper extremities. No abdominal pain noted. Pt states no diarrhea or constipation.

## 2019-06-25 NOTE — ED Notes (Signed)
IV attempted x 3.  Pt is difficult stick and will not let RN use her hand.  Was able to obtain blood work.  Discussed same with Dr. Jimmye Norman, medication orders changed to PO.

## 2019-06-25 NOTE — Progress Notes (Signed)
Advance care planning  Purpose of Encounter Right arm pain and elevated troponin  Parties in Attendance Patient  Patients Decisional capacity Alert and oriented.  Able to make medical decisions.  Husband is her healthcare power of attorney.  No ACP documents in place  Discussed in detail regarding right arm pain and elevated troponin.  Treatment plan , prognosis discussed.  All questions answered.  CODE STATUS discussed and patient wishes to be a full code  Orders entered and CODE STATUS changed  FULL CODE  Time spent - 17 minutes

## 2019-06-25 NOTE — ED Notes (Signed)
Pt eating dinner tray °

## 2019-06-25 NOTE — ED Provider Notes (Signed)
[facility]  Emergency Department Provider Note       Time seen: ----------------------------------------- 9:47 AM on 06/25/2019 -----------------------------------------   I have reviewed the triage vital signs and the nursing notes.  HISTORY   Chief Complaint Arm Pain and Emesis    HPI Veronica Murray is a 63 y.o. female with a history of diabetes who presents to the ED for right arm pain for the past 4 days.  She has had similar pain in the past although never this severe.  She is had nausea and vomiting since the pain began.  She has not had any injury to the right arm.  She describes decreased sensation that goes down to her right thumb and index finger as well as over the right upper arm posteriorly.  Past Medical History:  Diagnosis Date  . Diabetes mellitus without complication ([place])     There are no active problems to display for this patient.   History reviewed. No pertinent surgical history.  Allergies Codeine  Social History Social History   Tobacco Use  . Smoking status: Current Every Day Smoker    Packs/day: 1.00  Substance Use Topics  . Alcohol use: Not Currently  . Drug use: Not on file   Review of Systems Constitutional: Negative for fever. Cardiovascular: Negative for chest pain. Respiratory: Negative for shortness of breath. Gastrointestinal: Negative for abdominal pain, vomiting and diarrhea. Musculoskeletal: Positive for right arm pain Skin: Negative for rash. Neurological: Negative for headaches, positive for paresthesias  All systems negative/normal/unremarkable except as stated in the HPI  ____________________________________________   PHYSICAL EXAM:  VITAL SIGNS: ED Triage Vitals  Enc Vitals Group     BP 06/25/19 0852 108/65     Pulse Rate 06/25/19 0852 (!) 120     Resp 06/25/19 0852 20     Temp 06/25/19 0852 97.7 F (36.5 C)     Temp Source 06/25/19 0852 Oral     SpO2 06/25/19 0852 98 %     Weight  06/25/19 0853 170 lb (77.1 kg)     Height 06/25/19 0853 5\' 2"  (1.575 m)     Head Circumference --      Peak Flow --      Pain Score 06/25/19 0853 10     Pain Loc --      Pain Edu? --      Excl. in [place]? --    Constitutional: Alert and oriented. Well appearing and in no distress. Eyes: Conjunctivae are normal. Normal extraocular movements. Cardiovascular: Normal rate, regular rhythm. No murmurs, rubs, or gallops. Respiratory: Normal respiratory effort without tachypnea nor retractions. Breath sounds are clear and equal bilaterally. No wheezes/rales/rhonchi. Gastrointestinal: Soft and nontender. Normal bowel sounds Musculoskeletal: Pain with range of motion of the right shoulder, there is right trapezius muscle spasm and tenderness, some radicular component to the right arm pain Neurologic:  Normal speech and language. No gross focal neurologic deficits are appreciated.  Skin:  Skin is warm, dry and intact. No rash noted. Psychiatric: Mood and affect are normal. Speech and behavior are normal.  ____________________________________________  EKG: Interpreted by me.  Sinus tachycardia with a rate of 101 bpm, normal PR interval, normal QRS, normal QT  ____________________________________________  ED COURSE:  As part of my medical decision making, I reviewed the following data within the [place] History obtained from family if available, nursing notes, old chart and ekg, as well as notes from prior ED visits. Patient presented for right shoulder pain,  we will assess with labs and imaging as indicated at this time.   Procedures  Veronica Murray was evaluated in Emergency Department on 06/25/2019 for the symptoms described in the history of present illness. She was evaluated in the context of the global COVID-19 pandemic, which necessitated consideration that the patient might be at risk for infection with the SARS-CoV-2 virus that causes COVID-19. Institutional protocols and  algorithms that pertain to the evaluation of patients at risk for COVID-19 are in a state of rapid change based on information released by regulatory bodies including the CDC and federal and state organizations. These policies and algorithms were followed during the patient's care in the ED.  ____________________________________________   LABS (pertinent positives/negatives)  Labs Reviewed  BASIC METABOLIC PANEL - Abnormal; Notable for the following components:      Result Value   Sodium 133 (*)    Chloride 97 (*)    Glucose, Bld 253 (*)    All other components within normal limits  CBC - Abnormal; Notable for the following components:   Hemoglobin 15.4 (*)    All other components within normal limits  TROPONIN I (HIGH SENSITIVITY) - Abnormal; Notable for the following components:   Troponin I (High Sensitivity) 187 (*)    All other components within normal limits  TROPONIN I (HIGH SENSITIVITY)    RADIOLOGY Images were viewed by me  Chest x-ray Is unremarkable IMPRESSION: 1. Normal caliber of the thoracic aorta and no dissection. Scattered atherosclerotic calcifications at the aortic arch. 2. Age advanced atherosclerotic calcifications involving the aorta but the branch vessels are patent and there is no aneurysm or dissection. 3. Status post cholecystectomy with mild associated biliary dilatation. 4. No acute abdominal/pelvic findings, mass lesions or adenopathy. ____________________________________________   DIFFERENTIAL DIAGNOSIS   Cervical radiculopathy, spasm, strain, arthritis, MI  FINAL ASSESSMENT AND PLAN  Radicular right arm pain, elevated troponin   Plan: The patient had presented for likely cervical radiculopathy. Patient's labs did indicate a markedly elevated troponin of 187.  CT dissection protocol was negative for any acute process.  She will be placed on heparin as well as given aspirin for her markedly elevated troponin.  I will discuss with the  hospitalist for admission.   Ulice DashJohnathan E Nana Hoselton, MD    Note: This note was generated in part or whole with voice recognition software. Voice recognition is usually quite accurate but there are transcription errors that can and very often do occur. I apologize for any typographical errors that were not detected and corrected.     Emily FilbertWilliams, Fouad Taul E, MD 06/25/19 1249

## 2019-06-25 NOTE — ED Notes (Signed)
Patient transported to CT 

## 2019-06-25 NOTE — ED Notes (Signed)
XR at bedside

## 2019-06-25 NOTE — ED Notes (Signed)
Pt helped to the bedside toilet and back to bed.

## 2019-06-26 ENCOUNTER — Observation Stay: Payer: Medicare HMO

## 2019-06-26 ENCOUNTER — Encounter (HOSPITAL_BASED_OUTPATIENT_CLINIC_OR_DEPARTMENT_OTHER): Payer: Medicare HMO

## 2019-06-26 DIAGNOSIS — M79621 Pain in right upper arm: Secondary | ICD-10-CM | POA: Diagnosis not present

## 2019-06-26 DIAGNOSIS — I700 Atherosclerosis of aorta: Secondary | ICD-10-CM | POA: Diagnosis not present

## 2019-06-26 DIAGNOSIS — M79603 Pain in arm, unspecified: Secondary | ICD-10-CM

## 2019-06-26 DIAGNOSIS — I200 Unstable angina: Secondary | ICD-10-CM

## 2019-06-26 DIAGNOSIS — R7989 Other specified abnormal findings of blood chemistry: Secondary | ICD-10-CM | POA: Diagnosis not present

## 2019-06-26 DIAGNOSIS — R9431 Abnormal electrocardiogram [ECG] [EKG]: Secondary | ICD-10-CM | POA: Diagnosis not present

## 2019-06-26 DIAGNOSIS — Z720 Tobacco use: Secondary | ICD-10-CM | POA: Diagnosis not present

## 2019-06-26 LAB — BASIC METABOLIC PANEL
Anion gap: 11 (ref 5–15)
BUN: 25 mg/dL — ABNORMAL HIGH (ref 8–23)
CO2: 24 mmol/L (ref 22–32)
Calcium: 9.2 mg/dL (ref 8.9–10.3)
Chloride: 94 mmol/L — ABNORMAL LOW (ref 98–111)
Creatinine, Ser: 0.69 mg/dL (ref 0.44–1.00)
GFR calc Af Amer: >60 mL/min (ref >60)
GFR calc non Af Amer: >60 mL/min (ref >60)
Glucose, Bld: 196 mg/dL — ABNORMAL HIGH (ref 70–99)
Potassium: 3.7 mmol/L (ref 3.5–5.1)
Sodium: 129 mmol/L — ABNORMAL LOW (ref 135–145)

## 2019-06-26 LAB — CBC
HCT: 40.5 % (ref 36.0–46.0)
Hemoglobin: 14.3 g/dL (ref 12.0–15.0)
MCH: 30.9 pg (ref 26.0–34.0)
MCHC: 35.3 g/dL (ref 30.0–36.0)
MCV: 87.5 fL (ref 80.0–100.0)
Platelets: 386 10*3/uL (ref 150–400)
RBC: 4.63 MIL/uL (ref 3.87–5.11)
RDW: 13.2 % (ref 11.5–15.5)
WBC: 10.2 10*3/uL (ref 4.0–10.5)
nRBC: 0 % (ref 0.0–0.2)

## 2019-06-26 LAB — NM MYOCAR MULTI W/SPECT W/WALL MOTION / EF
Estimated workload: 1 METS
Exercise duration (min): 1 min
Exercise duration (sec): 0 s
LV dias vol: 108 mL (ref 46–106)
LV sys vol: 44 mL
MPHR: 159 {beats}/min
Peak HR: 121 {beats}/min
Percent HR: 76 %
Rest HR: 85 {beats}/min
SDS: 2
SRS: 12
SSS: 11
TID: 1.12

## 2019-06-26 LAB — GLUCOSE, CAPILLARY
Glucose-Capillary: 291 mg/dL — ABNORMAL HIGH (ref 70–99)
Glucose-Capillary: 231 mg/dL — ABNORMAL HIGH (ref 70–99)

## 2019-06-26 LAB — LIPID PANEL
Cholesterol: 269 mg/dL — ABNORMAL HIGH (ref 0–200)
HDL: 31 mg/dL — ABNORMAL LOW (ref >40)
LDL Cholesterol: 175 mg/dL — ABNORMAL HIGH (ref 0–99)
Total CHOL/HDL Ratio: 8.7 RATIO
Triglycerides: 314 mg/dL — ABNORMAL HIGH (ref <150)
VLDL: 63 mg/dL — ABNORMAL HIGH (ref 0–40)

## 2019-06-26 IMAGING — US RIGHT UPPER EXTREMITY VENOUS ULTRASOUND
1 series · 13 of 24 positions shown · non-contrast
Comparison: None.

CLINICAL DATA: Right upper extremity pain for the past 4 days



[Series 1: right upper extremity venous ultrasound · 0.08mm/px · 13 of 34 slices shown]
[im 1/34]
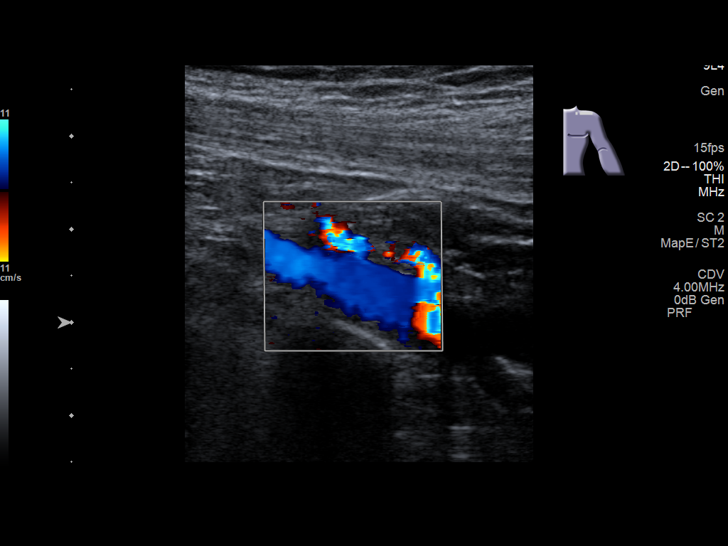
[im 3/34]
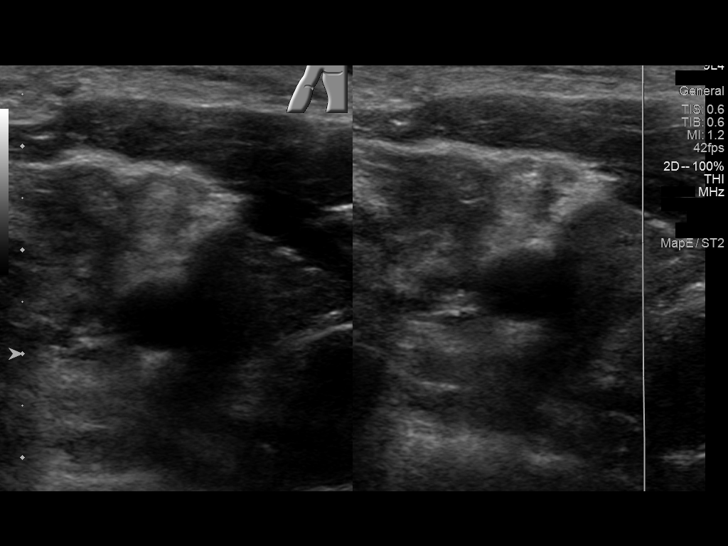
[im 6/34]
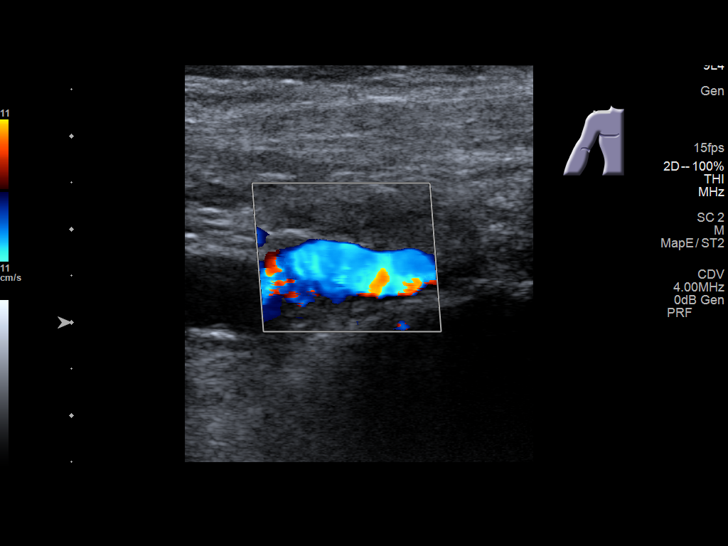
[im 9/34]
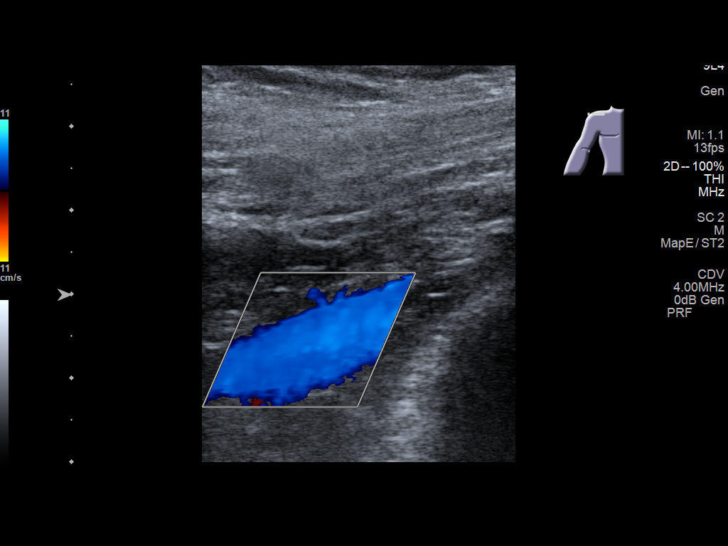
[im 12/34]
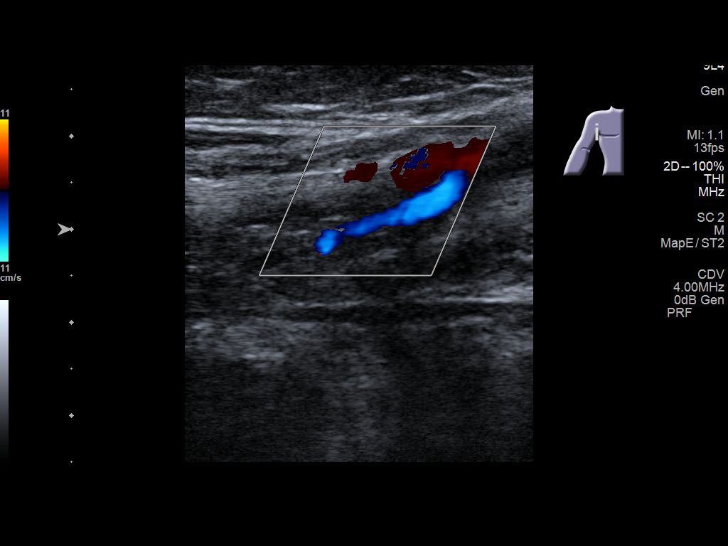
[im 15/34]
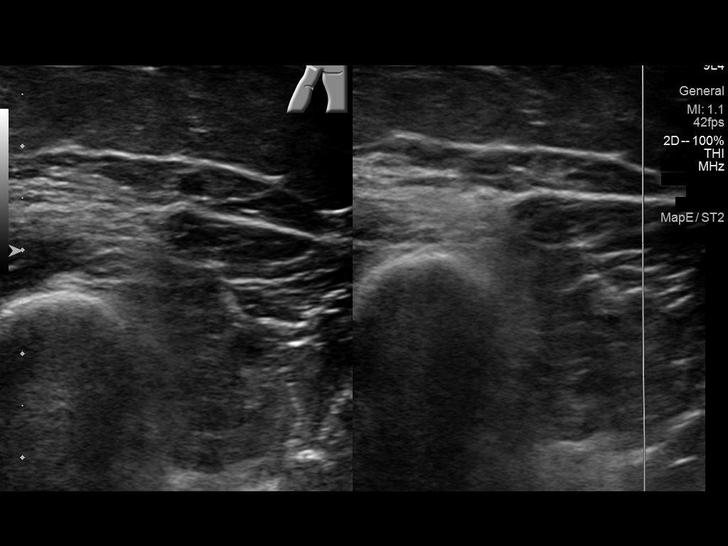
[im 18/34]
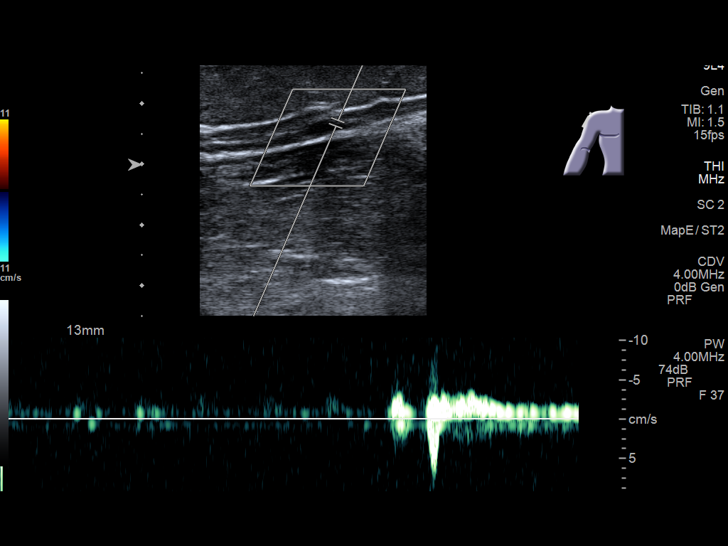
[im 19/34]
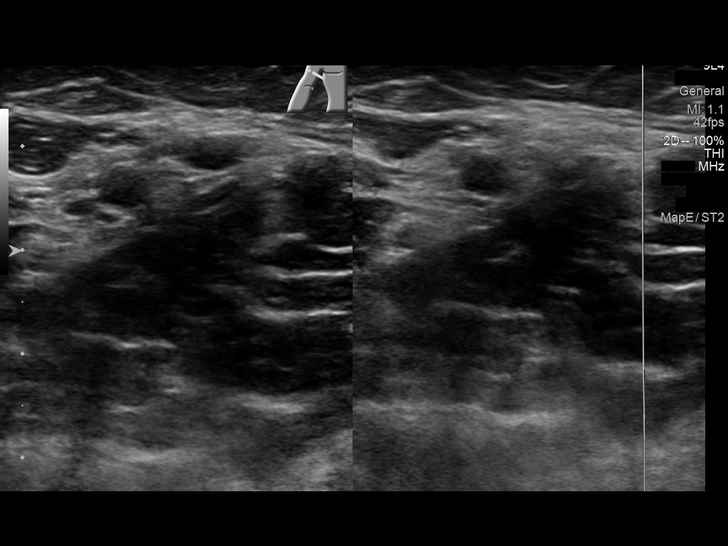
[im 22/34]
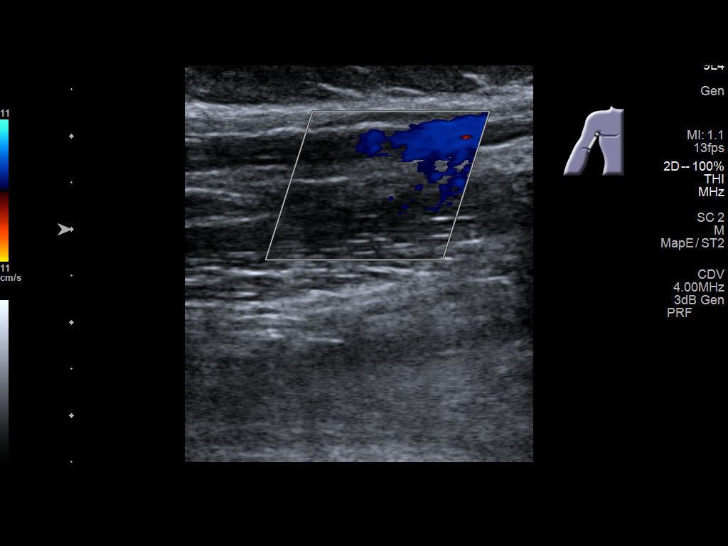
[im 25/34]
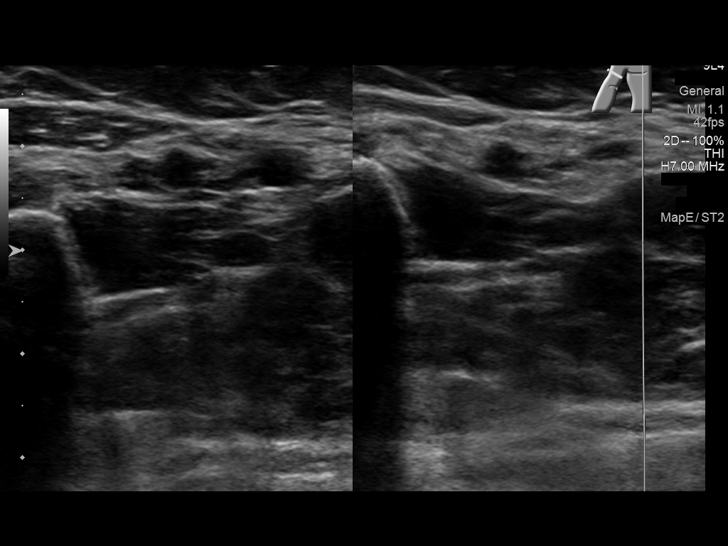
[im 28/34]
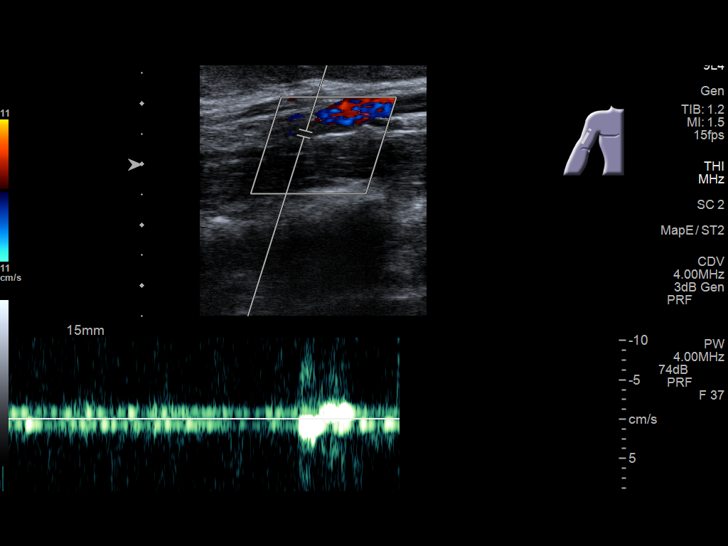
[im 31/34]
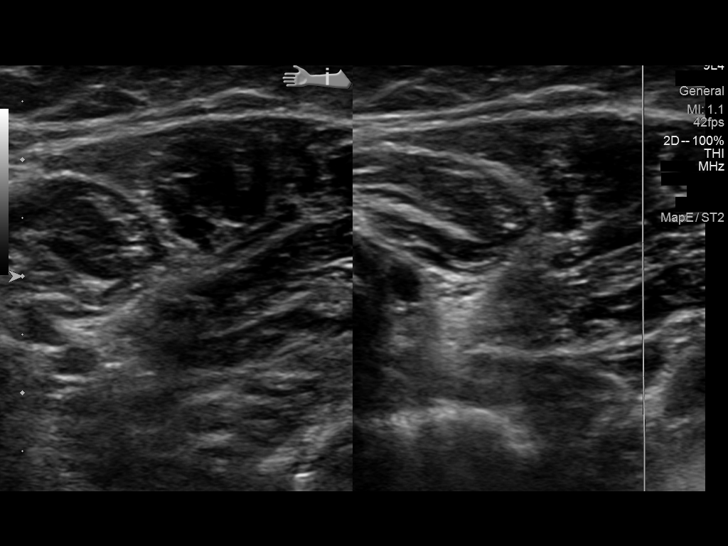
[im 34/34]
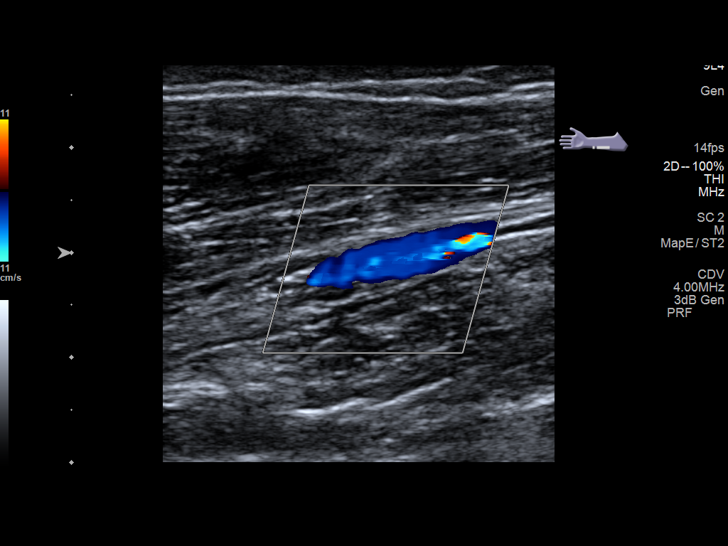

[13 of 24 positions shown; findings below may reference images not displayed]

FINDINGS: Contralateral Subclavian Vein: Respiratory phasicity is normal and
symmetric with the symptomatic side. No evidence of thrombus. Normal
compressibility.

Internal Jugular Vein: No evidence of thrombus. Normal
compressibility, respiratory phasicity and response to augmentation.

Subclavian Vein: No evidence of thrombus. Normal compressibility,
respiratory phasicity and response to augmentation.

Axillary Vein: No evidence of thrombus. Normal compressibility,
respiratory phasicity and response to augmentation.

Cephalic Vein: No evidence of thrombus. Normal compressibility,
respiratory phasicity and response to augmentation.

Basilic Vein: No evidence of thrombus. Normal compressibility,
respiratory phasicity and response to augmentation.

Brachial Veins: No evidence of thrombus. Normal compressibility,
respiratory phasicity and response to augmentation.

Radial Veins: No evidence of thrombus. Normal compressibility,
respiratory phasicity and response to augmentation.

Ulnar Veins: No evidence of thrombus. Normal compressibility,
respiratory phasicity and response to augmentation.

Venous Reflux:  None visualized.

Other Findings:  None visualized.
IMPRESSION: No evidence of DVT within the right upper extremity.

## 2019-06-26 MED ORDER — METOPROLOL TARTRATE 25 MG PO TABS: 12.5000 mg | ORAL_TABLET | Freq: Two times a day (BID) | ORAL | Status: DC

## 2019-06-26 MED ORDER — INSULIN GLARGINE 100 UNITS/ML SOLOSTAR PEN: 20.0000 [IU] | PEN_INJECTOR | Freq: Every day | SUBCUTANEOUS | 0 refills | Status: DC

## 2019-06-26 MED ORDER — ATORVASTATIN CALCIUM 80 MG PO TABS: 80.0000 mg | ORAL_TABLET | Freq: Every day | ORAL | Status: DC

## 2019-06-26 MED ORDER — OXYCODONE-ACETAMINOPHEN 5-325 MG PO TABS: 1.0000 | ORAL_TABLET | Freq: Three times a day (TID) | ORAL | 0 refills | Status: DC | PRN

## 2019-06-26 MED ORDER — INSULIN PEN NEEDLE 30G X 8 MM MISC: 1.0000 | 0 refills | Status: DC | PRN

## 2019-06-26 MED ORDER — GABAPENTIN 300 MG PO CAPS: 300.0000 mg | ORAL_CAPSULE | Freq: Two times a day (BID) | ORAL | 0 refills | Status: DC

## 2019-06-26 MED ORDER — TECHNETIUM TC 99M TETROFOSMIN IV KIT
10.0000 | PACK | Freq: Once | INTRAVENOUS | Status: AC | PRN
  Administered 2019-06-26: 9.563 via INTRAVENOUS

## 2019-06-26 MED ORDER — ATORVASTATIN CALCIUM 20 MG PO TABS: 20.0000 mg | ORAL_TABLET | Freq: Every day | ORAL | 0 refills | Status: DC

## 2019-06-26 MED ORDER — INSULIN GLARGINE 100 UNIT/ML ~~LOC~~ SOLN
20.0000 [IU] | Freq: Every day | SUBCUTANEOUS | Status: DC
  Filled 2019-06-26 (×2): qty 0.2

## 2019-06-26 MED ORDER — ATORVASTATIN CALCIUM 20 MG PO TABS: 20.0000 mg | ORAL_TABLET | Freq: Every day | ORAL | Status: DC

## 2019-06-26 MED ORDER — TECHNETIUM TC 99M TETROFOSMIN IV KIT
30.0000 | PACK | Freq: Once | INTRAVENOUS | Status: AC | PRN
  Administered 2019-06-26: 09:00:00 28.268 via INTRAVENOUS

## 2019-06-26 MED ORDER — ASPIRIN 81 MG PO TBEC: 81.0000 mg | DELAYED_RELEASE_TABLET | Freq: Every day | ORAL | 0 refills | Status: DC

## 2019-06-26 MED ORDER — METOPROLOL TARTRATE 25 MG PO TABS: 12.5000 mg | ORAL_TABLET | Freq: Two times a day (BID) | ORAL | 0 refills | Status: DC

## 2019-06-26 MED ORDER — GLIPIZIDE 10 MG PO TABS
10.0000 mg | ORAL_TABLET | Freq: Two times a day (BID) | ORAL | Status: DC
  Administered 2019-06-26: 10 mg via ORAL
  Filled 2019-06-26 (×2): qty 1

## 2019-06-26 MED ORDER — OXYCODONE-ACETAMINOPHEN 5-325 MG PO TABS
1.0000 | ORAL_TABLET | Freq: Once | ORAL | Status: AC
  Administered 2019-06-26: 1 via ORAL
  Filled 2019-06-26: qty 1

## 2019-06-26 MED ORDER — REGADENOSON 0.4 MG/5ML IV SOLN
0.4000 mg | Freq: Once | INTRAVENOUS | Status: AC
  Administered 2019-06-26: 0.4 mg via INTRAVENOUS

## 2019-06-26 NOTE — Consult Note (Addendum)
Cardiology Consultation:   Patient ID: Veronica Murray MRN: [id number]; DOB: 1955/11/23  Admit date: 06/25/2019 Date of Consult: 06/26/2019  Primary Care Provider: System, Pcp Not In Primary Cardiologist: No primary care provider on file.  Primary Electrophysiologist:  None    Patient Profile:   Veronica HeimlichMargaret M Murray is a 63 y.o. female with a hx of multiple cardiovascular risk factors who is being seen today for the evaluation of right arm and neck pain with minimally elevated troponin at the request of Dr. Elpidio AnisSudini.  History of Present Illness:   Veronica Murray is a 63 year old woman with diabetes, hypertension, and tobacco abuse, presenting with right arm and neck pain.  She has had approximately 5 days of right arm pain that intensified yesterday and prompted her emergency room evaluation.  She describes the pain as an aching discomfort in the right upper arm.  The pain is worse in certain positions.  It has been constant over the last 5 days but did worsen yesterday.  She notes some improvement in her pain today.  The pain radiates into the muscles of the right neck.  There is no central chest pain or central back pain.  She denies shortness of breath, edema, heart palpitations, orthopnea, or PND.  She has had no diaphoresis.  She does complain of nausea.  The patient's cardiovascular risk factors include poorly controlled diabetes with a hemoglobin A1c of 11.4, ongoing tobacco use, and hypertension.  She denies any family history of coronary artery disease in her first-degree relatives.  Her evaluation thus far has included CT angiography of the chest, abdomen and pelvis.  This has demonstrated aortic atherosclerosis, but no evidence of dissection.  High-sensitivity troponin is elevated at 187 and 171.  Heart Pathway Score:     Past Medical History:  Diagnosis Date   Diabetes mellitus without complication (HCC)    Essential hypertension    Tobacco abuse     History reviewed. No  pertinent surgical history.   Home Medications:  Prior to Admission medications   Not on File    Inpatient Medications: Scheduled Meds:  aspirin EC  81 mg Oral Daily   enoxaparin (LOVENOX) injection  40 mg Subcutaneous Q24H   gabapentin  300 mg Oral BID   glipiZIDE  10 mg Oral BID AC    HYDROmorphone (DILAUDID) injection  0.5 mg Intravenous Once   ibuprofen  400 mg Oral TID   insulin aspart  0-15 Units Subcutaneous TID WC   insulin aspart  0-5 Units Subcutaneous QHS   sodium chloride flush  3 mL Intravenous Q12H   Continuous Infusions:  PRN Meds: acetaminophen **OR** acetaminophen, albuterol, HYDROmorphone (DILAUDID) injection, ondansetron **OR** ondansetron (ZOFRAN) IV, oxyCODONE-acetaminophen, polyethylene glycol  Allergies:    Allergies  Allergen Reactions   Codeine Hives    Social History:   Social History   Socioeconomic History   Marital status: Married    Spouse name: Not on file   Number of children: Not on file   Years of education: Not on file   Highest education level: Not on file  Occupational History   Not on file  Social Needs   Financial resource strain: Not on file   Food insecurity    Worry: Not on file    Inability: Not on file   Transportation needs    Medical: Not on file    Non-medical: Not on file  Tobacco Use   Smoking status: Current Every Day Smoker    Packs/day: 1.00  Smokeless tobacco: Never Used  Substance and Sexual Activity   Alcohol use: Not Currently   Drug use: Not on file   Sexual activity: Not on file  Lifestyle   Physical activity    Days per week: Not on file    Minutes per session: Not on file   Stress: Not on file  Relationships   Social connections    Talks on phone: Not on file    Gets together: Not on file    Attends religious service: Not on file    Active member of club or organization: Not on file    Attends meetings of clubs or organizations: Not on file    Relationship  status: Not on file   Intimate partner violence    Fear of current or ex partner: Not on file    Emotionally abused: Not on file    Physically abused: Not on file    Forced sexual activity: Not on file  Other Topics Concern   Not on file  Social History Narrative   Not on file    Family History:   Family History  Problem Relation Age of Onset   CAD Neg Hx      ROS:  Please see the history of present illness.  All other ROS reviewed and negative.     Physical Exam/Data:   Vitals:   06/25/19 1726 06/25/19 1841 06/25/19 1936 06/26/19 0512  BP: 129/72 122/73 126/89 (!) 142/67  Pulse: 85 90 93 85  Resp: 18 18 20 20   Temp:  97.6 F (36.4 C) 98.4 F (36.9 C) 98.4 F (36.9 C)  TempSrc:  Oral Oral Oral  SpO2: 98% 99% 98% 100%  Weight:    75.9 kg  Height:        Intake/Output Summary (Last 24 hours) at 06/26/2019 1014 Last data filed at 06/26/2019 0315 Gross per 24 hour  Intake --  Output 300 ml  Net -300 ml   Last 3 Weights 06/26/2019 06/25/2019  Weight (lbs) 167 lb 4.8 oz 170 lb  Weight (kg) 75.887 kg 77.111 kg     Body mass index is 30.6 kg/m.  General:  Well nourished, well developed, in no acute distress HEENT: Poor dentition Lymph: no adenopathy Neck: no JVD Endocrine:  No thryomegaly Vascular: No carotid bruits; FA pulses 2+ bilaterally  Cardiac:  normal S1, S2; RRR; no murmur  Lungs:  clear to auscultation bilaterally, no wheezing, rhonchi or rales  Abd: soft, nontender, no hepatomegaly  Ext: no edema Musculoskeletal:  No deformities, BUE and BLE strength normal and equal Skin: warm and dry  Neuro:  CNs 2-12 intact, no focal abnormalities noted Psych:  Normal affect   EKG:  The EKG was personally reviewed and demonstrates: Sinus tachycardia heart rate 101 bpm, lateral infarct age undetermined  Telemetry:  Telemetry was personally reviewed and demonstrates:  Normal sinus rhythm without arrhythmia  Relevant CV Studies: Pending  Laboratory  Data:  High Sensitivity Troponin:   Recent Labs  Lab 06/25/19 1015 06/25/19 1226  TROPONINIHS 187* 171*     Cardiac EnzymesNo results for input(s): TROPONINI in the last 168 hours. No results for input(s): TROPIPOC in the last 168 hours.  Chemistry Recent Labs  Lab 06/25/19 1015 06/26/19 0434  NA 133* 129*  K 4.0 3.7  CL 97* 94*  CO2 23 24  GLUCOSE 253* 196*  BUN 23 25*  CREATININE 0.72 0.69  CALCIUM 9.7 9.2  GFRNONAA >60 >60  GFRAA >60 >60  ANIONGAP  13 11    Recent Labs  Lab 06/25/19 1236  PROT 6.5  ALBUMIN 3.4*  AST 28  ALT 19  ALKPHOS 90  BILITOT 1.2   Hematology Recent Labs  Lab 06/25/19 1015 06/26/19 0434  WBC 9.1 10.2  RBC 5.03 4.63  HGB 15.4* 14.3  HCT 44.5 40.5  MCV 88.5 87.5  MCH 30.6 30.9  MCHC 34.6 35.3  RDW 13.4 13.2  PLT 385 386   BNPNo results for input(s): BNP, PROBNP in the last 168 hours.  DDimer No results for input(s): DDIMER in the last 168 hours.   Radiology/Studies:  Dg Chest Port 1 View  Result Date: 06/25/2019 CLINICAL DATA:  Chest pain. EXAM: PORTABLE CHEST 1 VIEW COMPARISON:  None. FINDINGS: The heart size and mediastinal contours are within normal limits. Both lungs are clear. No pneumothorax or pleural effusion is noted. The visualized skeletal structures are unremarkable. IMPRESSION: No active disease. Electronically Signed   By: Marijo Conception M.D.   On: 06/25/2019 09:48   Ct Angio Chest/abd/pel For Dissection W And/or Wo Contrast  Result Date: 06/25/2019 CLINICAL DATA:  Chest and back pain four days. EXAM: CT ANGIOGRAPHY CHEST, ABDOMEN AND PELVIS TECHNIQUE: Multidetector CT imaging through the chest, abdomen and pelvis was performed using the standard protocol during bolus administration of intravenous contrast. Multiplanar reconstructed images and MIPs were obtained and reviewed to evaluate the vascular anatomy. CONTRAST:  159mL OMNIPAQUE IOHEXOL 350 MG/ML SOLN COMPARISON:  None. FINDINGS: CTA CHEST FINDINGS  Cardiovascular: The heart is normal in size. No pericardial effusion. The aorta is normal in caliber. No dissection. Minimal scattered atherosclerotic calcifications at the aortic arch and at the major branch vessel ostia. Scattered coronary artery calcifications are noted. The pulmonary arteries appear normal. No pulmonary emboli are identified. Mediastinum/Nodes: No mediastinal or hilar mass or adenopathy. The esophagus is grossly normal. Lungs/Pleura: No acute pulmonary findings. No worrisome pulmonary lesions. Musculoskeletal: No significant bony findings. Review of the MIP images confirms the above findings. CTA ABDOMEN AND PELVIS FINDINGS VASCULAR Aorta: Advanced atherosclerotic calcifications for age, particular involving the distal aorta with mild mural thrombus. No aneurysm or dissection. The aortic branch vessels are patent. Celiac: Widely patent.  No atherosclerotic calcifications. SMA: Widely patent.  No atherosclerotic calcifications. Renals: Widely patent.  No atherosclerotic calcifications. IMA: Patent. Inflow: Advanced atherosclerotic calcifications involving the iliac arteries bilaterally, left greater than right. No aneurysm or dissection. Veins: Grossly normal. Review of the MIP images confirms the above findings. NON-VASCULAR Hepatobiliary: No obvious hepatic lesions or biliary dilatation. Perfusion abnormalities due to early phase arterial enhancement makes with very early portal venous opacification. The gallbladder is surgically absent. Mild associated common bile duct dilatation. Pancreas: No mass, inflammation or ductal dilatation. Spleen: Normal size.  No focal lesions. Adrenals/Urinary Tract: The adrenal glands and kidneys are unremarkable. Bladder is normal. Stomach/Bowel: The stomach, duodenum, small bowel and colon are unremarkable. No acute inflammatory changes, mass lesions or obstructive findings. The terminal ileum is normal. The appendix is normal. Lymphatic: No mesenteric or  retroperitoneal mass or adenopathy. No pelvic adenopathy. Reproductive: The uterus and ovaries are unremarkable. Other: No pelvic mass or free pelvic fluid collections. No inguinal mass or adenopathy. Musculoskeletal: No significant findings. Mild compression deformity of L5 of uncertain age. Review of the MIP images confirms the above findings. IMPRESSION: 1. Normal caliber of the thoracic aorta and no dissection. Scattered atherosclerotic calcifications at the aortic arch. 2. Age advanced atherosclerotic calcifications involving the aorta but the branch vessels are patent  and there is no aneurysm or dissection. 3. Status post cholecystectomy with mild associated biliary dilatation. 4. No acute abdominal/pelvic findings, mass lesions or adenopathy. Electronically Signed   By: P.  Gallerani M.D.   On: 06/25/2019 12:35  ° ° °Assessment and Plan:  ° °1. Elevated troponin: The patient has multiple CV risk factors as outlined above with poorly controlled diabetes, hypertension, and tobacco abuse.  Her baseline EKG is abnormal with a possible age-indeterminate lateral infarction.  However, her clinical symptoms are highly atypical and I do not think they represent acute coronary syndrome or even atypical angina.  She has constant right arm pain that is positional and this is highly consistent with musculoskeletal pain.  She will have a Lexiscan stress Myoview scan for further risk stratification.  We will follow-up after her stress test result is available. °2. Tobacco abuse: Cessation counseling done °3. Type 2 diabetes, insulin requiring, poorly controlled: Per hospitalist service °4. Aortic atherosclerosis: Will add a lipid panel to her labs.  The patient should be treated with a statin drug considering her comorbidities including diabetes. ° °   ° °For questions or updates, please contact [facility] °Please consult [email or web address] for contact info under  ° ° ° °Signed, °Dominque Marlin, MD  °06/26/2019 10:14 AM   ° °ADDENDUM: °Discussed stress test findings with the patient.  Stress test as outlined below: ° °Pharmacological myocardial perfusion imaging study with predominantly fixed large region of severely decreased perfusion in the proximal anterior wall through apical region consistent with infarct with ischemia noted in the proximal anterior and anteroseptal wall.  °EF estimated at 59%, unable to visualize wall motion  °Baseline EKG is abnormal, consistent with anterior wall ischemia °No EKG changes concerning for ischemia at peak stress or in recovery. °High risk scan ° °While the patient stress test suggest significant coronary disease with both infarct and ischemia, her clinical presentation is not consistent with acute coronary syndrome as I have outlined above.  She is adamant about needing to leave the hospital, and I think she is at low risk of a major cardiac event over period of just a few days.  I think it would be reasonable to bring her back for an outpatient cardiac catheterization.  I explained the catheterization procedure to her in detail as well as the possibility of PCI.  I have reviewed the risks, indications, and alternatives to cardiac catheterization, possible angioplasty, and stenting with the patient. Risks include but are not limited to bleeding, infection, vascular injury, stroke, myocardial infection, arrhythmia, kidney injury, radiation-related injury in the case of prolonged fluoroscopy use, emergency cardiac surgery, and death. The patient understands the risks of serious complication is 1-2 in 1000 with diagnostic cardiac cath and 1-2% or less with angioplasty/stenting. I will ask the office to contact her on Monday to arrange outpatient cath and possible PCI with Dr Arida or Dr End next week. I would DC her on ASA, a statin, and a beta blocker. Would avoid clopidogrel as she is at high risk of multivessel CAD considering her underlying risk factors. D/W Dr Sudini. ° °Anjelica Gorniak °06/26/2019 °3:11 PM ° ° ° ° °

## 2019-06-26 NOTE — Care Management Obs Status (Signed)
[facility] NOTIFICATION   Patient Details  Name: LANEAH LUFT MRN: [id number] Date of Birth: 1956-08-21   Medicare Observation Status Notification Given:  Yes    Sharolyn Weber A [place] Yeats, RN 06/26/2019, 11:50 AM

## 2019-06-26 NOTE — H&P (View-Only) (Signed)
Cardiology Consultation:   Patient ID: Veronica HeimlichMargaret M Cordner MRN: [id number]; DOB: 1955/11/23  Admit date: 06/25/2019 Date of Consult: 06/26/2019  Primary Care Provider: System, Pcp Not In Primary Cardiologist: No primary care provider on file.  Primary Electrophysiologist:  None    Patient Profile:   Veronica Murray is a 63 y.o. female with a hx of multiple cardiovascular risk factors who is being seen today for the evaluation of right arm and neck pain with minimally elevated troponin at the request of Dr. Elpidio AnisSudini.  History of Present Illness:   Ms. Veronica Murray is a 63 year old woman with diabetes, hypertension, and tobacco abuse, presenting with right arm and neck pain.  She has had approximately 5 days of right arm pain that intensified yesterday and prompted her emergency room evaluation.  She describes the pain as an aching discomfort in the right upper arm.  The pain is worse in certain positions.  It has been constant over the last 5 days but did worsen yesterday.  She notes some improvement in her pain today.  The pain radiates into the muscles of the right neck.  There is no central chest pain or central back pain.  She denies shortness of breath, edema, heart palpitations, orthopnea, or PND.  She has had no diaphoresis.  She does complain of nausea.  The patient's cardiovascular risk factors include poorly controlled diabetes with a hemoglobin A1c of 11.4, ongoing tobacco use, and hypertension.  She denies any family history of coronary artery disease in her first-degree relatives.  Her evaluation thus far has included CT angiography of the chest, abdomen and pelvis.  This has demonstrated aortic atherosclerosis, but no evidence of dissection.  High-sensitivity troponin is elevated at 187 and 171.  Heart Pathway Score:     Past Medical History:  Diagnosis Date   Diabetes mellitus without complication (HCC)    Essential hypertension    Tobacco abuse     History reviewed. No  pertinent surgical history.   Home Medications:  Prior to Admission medications   Not on File    Inpatient Medications: Scheduled Meds:  aspirin EC  81 mg Oral Daily   enoxaparin (LOVENOX) injection  40 mg Subcutaneous Q24H   gabapentin  300 mg Oral BID   glipiZIDE  10 mg Oral BID AC    HYDROmorphone (DILAUDID) injection  0.5 mg Intravenous Once   ibuprofen  400 mg Oral TID   insulin aspart  0-15 Units Subcutaneous TID WC   insulin aspart  0-5 Units Subcutaneous QHS   sodium chloride flush  3 mL Intravenous Q12H   Continuous Infusions:  PRN Meds: acetaminophen **OR** acetaminophen, albuterol, HYDROmorphone (DILAUDID) injection, ondansetron **OR** ondansetron (ZOFRAN) IV, oxyCODONE-acetaminophen, polyethylene glycol  Allergies:    Allergies  Allergen Reactions   Codeine Hives    Social History:   Social History   Socioeconomic History   Marital status: Married    Spouse name: Not on file   Number of children: Not on file   Years of education: Not on file   Highest education level: Not on file  Occupational History   Not on file  Social Needs   Financial resource strain: Not on file   Food insecurity    Worry: Not on file    Inability: Not on file   Transportation needs    Medical: Not on file    Non-medical: Not on file  Tobacco Use   Smoking status: Current Every Day Smoker    Packs/day: 1.00  Smokeless tobacco: Never Used  Substance and Sexual Activity   Alcohol use: Not Currently   Drug use: Not on file   Sexual activity: Not on file  Lifestyle   Physical activity    Days per week: Not on file    Minutes per session: Not on file   Stress: Not on file  Relationships   Social connections    Talks on phone: Not on file    Gets together: Not on file    Attends religious service: Not on file    Active member of club or organization: Not on file    Attends meetings of clubs or organizations: Not on file    Relationship  status: Not on file   Intimate partner violence    Fear of current or ex partner: Not on file    Emotionally abused: Not on file    Physically abused: Not on file    Forced sexual activity: Not on file  Other Topics Concern   Not on file  Social History Narrative   Not on file    Family History:   Family History  Problem Relation Age of Onset   CAD Neg Hx      ROS:  Please see the history of present illness.  All other ROS reviewed and negative.     Physical Exam/Data:   Vitals:   06/25/19 1726 06/25/19 1841 06/25/19 1936 06/26/19 0512  BP: 129/72 122/73 126/89 (!) 142/67  Pulse: 85 90 93 85  Resp: 18 18 20 20   Temp:  97.6 F (36.4 C) 98.4 F (36.9 C) 98.4 F (36.9 C)  TempSrc:  Oral Oral Oral  SpO2: 98% 99% 98% 100%  Weight:    75.9 kg  Height:        Intake/Output Summary (Last 24 hours) at 06/26/2019 1014 Last data filed at 06/26/2019 0315 Gross per 24 hour  Intake --  Output 300 ml  Net -300 ml   Last 3 Weights 06/26/2019 06/25/2019  Weight (lbs) 167 lb 4.8 oz 170 lb  Weight (kg) 75.887 kg 77.111 kg     Body mass index is 30.6 kg/m.  General:  Well nourished, well developed, in no acute distress HEENT: Poor dentition Lymph: no adenopathy Neck: no JVD Endocrine:  No thryomegaly Vascular: No carotid bruits; FA pulses 2+ bilaterally  Cardiac:  normal S1, S2; RRR; no murmur  Lungs:  clear to auscultation bilaterally, no wheezing, rhonchi or rales  Abd: soft, nontender, no hepatomegaly  Ext: no edema Musculoskeletal:  No deformities, BUE and BLE strength normal and equal Skin: warm and dry  Neuro:  CNs 2-12 intact, no focal abnormalities noted Psych:  Normal affect   EKG:  The EKG was personally reviewed and demonstrates: Sinus tachycardia heart rate 101 bpm, lateral infarct age undetermined  Telemetry:  Telemetry was personally reviewed and demonstrates:  Normal sinus rhythm without arrhythmia  Relevant CV Studies: Pending  Laboratory  Data:  High Sensitivity Troponin:   Recent Labs  Lab 06/25/19 1015 06/25/19 1226  TROPONINIHS 187* 171*     Cardiac EnzymesNo results for input(s): TROPONINI in the last 168 hours. No results for input(s): TROPIPOC in the last 168 hours.  Chemistry Recent Labs  Lab 06/25/19 1015 06/26/19 0434  NA 133* 129*  K 4.0 3.7  CL 97* 94*  CO2 23 24  GLUCOSE 253* 196*  BUN 23 25*  CREATININE 0.72 0.69  CALCIUM 9.7 9.2  GFRNONAA >60 >60  GFRAA >60 >60  ANIONGAP  13 11    Recent Labs  Lab 06/25/19 1236  PROT 6.5  ALBUMIN 3.4*  AST 28  ALT 19  ALKPHOS 90  BILITOT 1.2   Hematology Recent Labs  Lab 06/25/19 1015 06/26/19 0434  WBC 9.1 10.2  RBC 5.03 4.63  HGB 15.4* 14.3  HCT 44.5 40.5  MCV 88.5 87.5  MCH 30.6 30.9  MCHC 34.6 35.3  RDW 13.4 13.2  PLT 385 386   BNPNo results for input(s): BNP, PROBNP in the last 168 hours.  DDimer No results for input(s): DDIMER in the last 168 hours.   Radiology/Studies:  Dg Chest Port 1 View  Result Date: 06/25/2019 CLINICAL DATA:  Chest pain. EXAM: PORTABLE CHEST 1 VIEW COMPARISON:  None. FINDINGS: The heart size and mediastinal contours are within normal limits. Both lungs are clear. No pneumothorax or pleural effusion is noted. The visualized skeletal structures are unremarkable. IMPRESSION: No active disease. Electronically Signed   By: Marijo Conception M.D.   On: 06/25/2019 09:48   Ct Angio Chest/abd/pel For Dissection W And/or Wo Contrast  Result Date: 06/25/2019 CLINICAL DATA:  Chest and back pain four days. EXAM: CT ANGIOGRAPHY CHEST, ABDOMEN AND PELVIS TECHNIQUE: Multidetector CT imaging through the chest, abdomen and pelvis was performed using the standard protocol during bolus administration of intravenous contrast. Multiplanar reconstructed images and MIPs were obtained and reviewed to evaluate the vascular anatomy. CONTRAST:  159mL OMNIPAQUE IOHEXOL 350 MG/ML SOLN COMPARISON:  None. FINDINGS: CTA CHEST FINDINGS  Cardiovascular: The heart is normal in size. No pericardial effusion. The aorta is normal in caliber. No dissection. Minimal scattered atherosclerotic calcifications at the aortic arch and at the major branch vessel ostia. Scattered coronary artery calcifications are noted. The pulmonary arteries appear normal. No pulmonary emboli are identified. Mediastinum/Nodes: No mediastinal or hilar mass or adenopathy. The esophagus is grossly normal. Lungs/Pleura: No acute pulmonary findings. No worrisome pulmonary lesions. Musculoskeletal: No significant bony findings. Review of the MIP images confirms the above findings. CTA ABDOMEN AND PELVIS FINDINGS VASCULAR Aorta: Advanced atherosclerotic calcifications for age, particular involving the distal aorta with mild mural thrombus. No aneurysm or dissection. The aortic branch vessels are patent. Celiac: Widely patent.  No atherosclerotic calcifications. SMA: Widely patent.  No atherosclerotic calcifications. Renals: Widely patent.  No atherosclerotic calcifications. IMA: Patent. Inflow: Advanced atherosclerotic calcifications involving the iliac arteries bilaterally, left greater than right. No aneurysm or dissection. Veins: Grossly normal. Review of the MIP images confirms the above findings. NON-VASCULAR Hepatobiliary: No obvious hepatic lesions or biliary dilatation. Perfusion abnormalities due to early phase arterial enhancement makes with very early portal venous opacification. The gallbladder is surgically absent. Mild associated common bile duct dilatation. Pancreas: No mass, inflammation or ductal dilatation. Spleen: Normal size.  No focal lesions. Adrenals/Urinary Tract: The adrenal glands and kidneys are unremarkable. Bladder is normal. Stomach/Bowel: The stomach, duodenum, small bowel and colon are unremarkable. No acute inflammatory changes, mass lesions or obstructive findings. The terminal ileum is normal. The appendix is normal. Lymphatic: No mesenteric or  retroperitoneal mass or adenopathy. No pelvic adenopathy. Reproductive: The uterus and ovaries are unremarkable. Other: No pelvic mass or free pelvic fluid collections. No inguinal mass or adenopathy. Musculoskeletal: No significant findings. Mild compression deformity of L5 of uncertain age. Review of the MIP images confirms the above findings. IMPRESSION: 1. Normal caliber of the thoracic aorta and no dissection. Scattered atherosclerotic calcifications at the aortic arch. 2. Age advanced atherosclerotic calcifications involving the aorta but the branch vessels are patent  and there is no aneurysm or dissection. 3. Status post cholecystectomy with mild associated biliary dilatation. 4. No acute abdominal/pelvic findings, mass lesions or adenopathy. Electronically Signed   By: Rudie MeyerP.  Gallerani M.D.   On: 06/25/2019 12:35    Assessment and Plan:   1. Elevated troponin: The patient has multiple CV risk factors as outlined above with poorly controlled diabetes, hypertension, and tobacco abuse.  Her baseline EKG is abnormal with a possible age-indeterminate lateral infarction.  However, her clinical symptoms are highly atypical and I do not think they represent acute coronary syndrome or even atypical angina.  She has constant right arm pain that is positional and this is highly consistent with musculoskeletal pain.  She will have a Lexiscan stress Myoview scan for further risk stratification.  We will follow-up after her stress test result is available. 2. Tobacco abuse: Cessation counseling done 3. Type 2 diabetes, insulin requiring, poorly controlled: Per hospitalist service 4. Aortic atherosclerosis: Will add a lipid panel to her labs.  The patient should be treated with a statin drug considering her comorbidities including diabetes.      For questions or updates, please contact [facility] Please consult [email or web address] for contact info under     Signed, Tonny BollmanMichael Sonoma Firkus, MD  06/26/2019 10:14 AM    ADDENDUM: Discussed stress test findings with the patient.  Stress test as outlined below:  Pharmacological myocardial perfusion imaging study with predominantly fixed large region of severely decreased perfusion in the proximal anterior wall through apical region consistent with infarct with ischemia noted in the proximal anterior and anteroseptal wall.  EF estimated at 59%, unable to visualize wall motion  Baseline EKG is abnormal, consistent with anterior wall ischemia No EKG changes concerning for ischemia at peak stress or in recovery. High risk scan  While the patient stress test suggest significant coronary disease with both infarct and ischemia, her clinical presentation is not consistent with acute coronary syndrome as I have outlined above.  She is adamant about needing to leave the hospital, and I think she is at low risk of a major cardiac event over period of just a few days.  I think it would be reasonable to bring her back for an outpatient cardiac catheterization.  I explained the catheterization procedure to her in detail as well as the possibility of PCI.  I have reviewed the risks, indications, and alternatives to cardiac catheterization, possible angioplasty, and stenting with the patient. Risks include but are not limited to bleeding, infection, vascular injury, stroke, myocardial infection, arrhythmia, kidney injury, radiation-related injury in the case of prolonged fluoroscopy use, emergency cardiac surgery, and death. The patient understands the risks of serious complication is 1-2 in 1000 with diagnostic cardiac cath and 1-2% or less with angioplasty/stenting. I will ask the office to contact her on Monday to arrange outpatient cath and possible PCI with Dr Kirke CorinArida or Dr End next week. I would DC her on ASA, a statin, and a beta blocker. Would avoid clopidogrel as she is at high risk of multivessel CAD considering her underlying risk factors. D/W Dr Elpidio AnisSudini.  Tonny BollmanMichael  Elvan Ebron 06/26/2019 3:11 PM

## 2019-06-27 LAB — HIV ANTIBODY (ROUTINE TESTING W REFLEX): HIV Screen 4th Generation wRfx: NONREACTIVE

## 2019-06-28 ENCOUNTER — Telehealth: Payer: Self-pay

## 2019-06-28 NOTE — Telephone Encounter (Addendum)
Attempted to contact the patient x2. No answer at both telephone numbers listed. Unable to lmom. Both phone numbers ring out with no voicemail.   Wellington Hampshire, MD  Sherren Mocha, MD; Ysidro Evert, MD; Lamar Laundry, RN        Lattie Haw,  This one I can do the cath on Thursday of this week or Monday the 24th. Thanks.   Previous Messages  ----- Message -----  From: Sherren Mocha, MD  Sent: 06/26/2019  3:13 PM EDT  To: Nelva Bush, MD, Jeannette How, *  Subject: Inpatient Notes                  Izora Gala: can you have someone contact her to arrange OP cath with Dr Fletcher Anon or Dr End next week? See note attached. thx Ronalee Belts

## 2019-06-29 NOTE — Telephone Encounter (Signed)
Patient calling back. She is agreeable to have heart cath on 07/05/19 at 0930 am. She verbalized understanding of the following:  You are scheduled for a Cardiac Catheterization on Monday, August 24 with Dr. Kathlyn Sacramento.  1. Please arrive at the [facility] at 8:30 AM (This time is one hour before your procedure to ensure your preparation). Free valet parking service is available.   Special note: Every effort is made to have your procedure done on time. Please understand that emergencies sometimes delay scheduled procedures.  2. Diet: Do not eat solid foods after midnight.  The patient may have clear liquids until 5am upon the day of the procedure.  3. Labs: You will need to have Pre-Op COVID Swab on Thursday, August 20 at [facility] Address: [address] [place], [place] [address]  Open: 8am - 3:45pm    4. Medication instructions in preparation for your procedure:   Contrast Allergy: No  Take only 10 units of insulin the night before your procedure. Do not take any insulin on the day of the procedure.  Do not take Diabetes Med n/a on the day of the procedure and HOLD 48 HOURS AFTER THE PROCEDURE.  On the morning of your procedure, take your Aspirin and any morning medicines NOT listed above.  You may use sips of water.  5. Plan for one night stay--bring personal belongings. 6. Bring a current list of your medications and current insurance cards. 7. You MUST have a responsible person to drive you home. 8. Someone MUST be with you the first 24 hours after you arrive home or your discharge will be delayed. 9. Please wear clothes that are easy to get on and off and wear slip-on shoes.  Thank you for allowing [place] to care for you!   -- [facility]    Message sent to pre-cert. LM with PreCovid testing at [facility] that patient would be coming on 07/01/19.

## 2019-06-29 NOTE — Telephone Encounter (Addendum)
Attempted again to contact the patient x2 at both telephone numbers listed for the patient. Unable to lmom. Both telephone numbers ring out with no answer.

## 2019-07-01 ENCOUNTER — Other Ambulatory Visit: Payer: Self-pay

## 2019-07-01 ENCOUNTER — Other Ambulatory Visit
Admission: RE | Admit: 2019-07-01 | Discharge: 2019-07-01 | Disposition: A | Discharge status: Home / Self Care | Payer: Medicare HMO | Source: Ambulatory Visit | Attending: Cardiovascular Disease | Admitting: Cardiovascular Disease

## 2019-07-01 DIAGNOSIS — Z20828 Contact with and (suspected) exposure to other viral communicable diseases: Secondary | ICD-10-CM | POA: Insufficient documentation

## 2019-07-01 DIAGNOSIS — Z01812 Encounter for preprocedural laboratory examination: Secondary | ICD-10-CM | POA: Insufficient documentation

## 2019-07-01 LAB — SARS CORONAVIRUS 2 (TAT 6-24 HRS): SARS Coronavirus 2: NEGATIVE

## 2019-07-05 ENCOUNTER — Ambulatory Visit
Admission: RE | Admit: 2019-07-05 | Discharge: 2019-07-05 | Disposition: A | Discharge status: Home / Self Care | Payer: Medicare HMO | Attending: Cardiovascular Disease | Admitting: Cardiovascular Disease

## 2019-07-05 ENCOUNTER — Other Ambulatory Visit: Payer: Self-pay

## 2019-07-05 ENCOUNTER — Encounter
Admission: RE | Disposition: A | Discharge status: Home / Self Care | Payer: Self-pay | Source: Home / Self Care | Attending: Cardiovascular Disease

## 2019-07-05 DIAGNOSIS — Z885 Allergy status to narcotic agent status: Secondary | ICD-10-CM | POA: Insufficient documentation

## 2019-07-05 DIAGNOSIS — Z794 Long term (current) use of insulin: Secondary | ICD-10-CM | POA: Insufficient documentation

## 2019-07-05 DIAGNOSIS — F17210 Nicotine dependence, cigarettes, uncomplicated: Secondary | ICD-10-CM | POA: Diagnosis not present

## 2019-07-05 DIAGNOSIS — I2510 Atherosclerotic heart disease of native coronary artery without angina pectoris: Secondary | ICD-10-CM

## 2019-07-05 DIAGNOSIS — I10 Essential (primary) hypertension: Secondary | ICD-10-CM | POA: Diagnosis not present

## 2019-07-05 DIAGNOSIS — Z8249 Family history of ischemic heart disease and other diseases of the circulatory system: Secondary | ICD-10-CM | POA: Diagnosis not present

## 2019-07-05 DIAGNOSIS — E1165 Type 2 diabetes mellitus with hyperglycemia: Secondary | ICD-10-CM | POA: Insufficient documentation

## 2019-07-05 DIAGNOSIS — R9439 Abnormal result of other cardiovascular function study: Secondary | ICD-10-CM

## 2019-07-05 DIAGNOSIS — I700 Atherosclerosis of aorta: Secondary | ICD-10-CM | POA: Diagnosis not present

## 2019-07-05 HISTORY — PX: LEFT HEART CATH AND CORONARY ANGIOGRAPHY: CATH118249

## 2019-07-05 LAB — GLUCOSE, CAPILLARY
Glucose-Capillary: 309 mg/dL — ABNORMAL HIGH (ref 70–99)
Glucose-Capillary: 290 mg/dL — ABNORMAL HIGH (ref 70–99)

## 2019-07-05 SURGERY — LEFT HEART CATH AND CORONARY ANGIOGRAPHY
Anesthesia: Moderate Sedation | Laterality: Left

## 2019-07-05 MED ORDER — FENTANYL CITRATE (PF) 100 MCG/2ML IJ SOLN
INTRAMUSCULAR | Status: DC | PRN
  Administered 2019-07-05: 25 ug via INTRAVENOUS

## 2019-07-05 MED ORDER — SODIUM CHLORIDE 0.9% FLUSH: 3.0000 mL | Freq: Two times a day (BID) | INTRAVENOUS | Status: DC

## 2019-07-05 MED ORDER — HEPARIN (PORCINE) IN NACL 2000-0.9 UNIT/L-% IV SOLN
INTRAVENOUS | Status: DC | PRN
  Administered 2019-07-05: 500 mL

## 2019-07-05 MED ORDER — ASPIRIN 81 MG PO CHEW: 81.0000 mg | CHEWABLE_TABLET | ORAL | Status: DC

## 2019-07-05 MED ORDER — SODIUM CHLORIDE 0.9 % IV SOLN
INTRAVENOUS | Status: DC
  Administered 2019-07-05: 10:00:00 via INTRAVENOUS

## 2019-07-05 MED ORDER — VERAPAMIL HCL 2.5 MG/ML IV SOLN
INTRAVENOUS | Status: AC
  Filled 2019-07-05: qty 2

## 2019-07-05 MED ORDER — MIDAZOLAM HCL 2 MG/2ML IJ SOLN
INTRAMUSCULAR | Status: DC | PRN
  Administered 2019-07-05 (×2): 1 mg via INTRAVENOUS

## 2019-07-05 MED ORDER — SODIUM CHLORIDE 0.9% FLUSH: 3.0000 mL | INTRAVENOUS | Status: DC | PRN

## 2019-07-05 MED ORDER — IOHEXOL 300 MG/ML  SOLN
INTRAMUSCULAR | Status: DC | PRN
  Administered 2019-07-05: 55 mL via INTRA_ARTERIAL

## 2019-07-05 MED ORDER — FENTANYL CITRATE (PF) 100 MCG/2ML IJ SOLN
INTRAMUSCULAR | Status: AC
  Filled 2019-07-05: qty 2

## 2019-07-05 MED ORDER — SODIUM CHLORIDE 0.9 % IV SOLN: 250.0000 mL | INTRAVENOUS | Status: DC | PRN

## 2019-07-05 MED ORDER — MIDAZOLAM HCL 2 MG/2ML IJ SOLN
INTRAMUSCULAR | Status: AC
  Filled 2019-07-05: qty 2

## 2019-07-05 MED ORDER — HEPARIN (PORCINE) IN NACL 1000-0.9 UT/500ML-% IV SOLN
INTRAVENOUS | Status: AC
  Filled 2019-07-05: qty 1000

## 2019-07-05 MED ORDER — ONDANSETRON HCL 4 MG/2ML IJ SOLN: 4.0000 mg | Freq: Four times a day (QID) | INTRAMUSCULAR | Status: DC | PRN

## 2019-07-05 MED ORDER — VERAPAMIL HCL 2.5 MG/ML IV SOLN
INTRAVENOUS | Status: DC | PRN
  Administered 2019-07-05: 2.5 mg via INTRA_ARTERIAL

## 2019-07-05 MED ORDER — HEPARIN SODIUM (PORCINE) 1000 UNIT/ML IJ SOLN
INTRAMUSCULAR | Status: AC
  Filled 2019-07-05: qty 1

## 2019-07-05 MED ORDER — ACETAMINOPHEN 325 MG PO TABS: 650.0000 mg | ORAL_TABLET | ORAL | Status: DC | PRN

## 2019-07-05 MED ORDER — SODIUM CHLORIDE 0.9 % IV SOLN: INTRAVENOUS | Status: DC

## 2019-07-05 SURGICAL SUPPLY — 7 items
CATH INFINITI 5 FR JL3.5 (CATHETERS) ×2 IMPLANT
CATH INFINITI 5FR JK (CATHETERS) ×2 IMPLANT
DEVICE RAD TR BAND REGULAR (VASCULAR PRODUCTS) ×2 IMPLANT
GLIDESHEATH SLEND SS 6F .021 (SHEATH) ×2 IMPLANT
KIT MANI 3VAL PERCEP (MISCELLANEOUS) ×3 IMPLANT
PACK CARDIAC CATH (CUSTOM PROCEDURE TRAY) ×3 IMPLANT
WIRE ROSEN-J .035X260CM (WIRE) ×2 IMPLANT

## 2019-07-05 NOTE — Interval H&P Note (Signed)
History and Physical Interval Note: The patient presented with right arm discomfort.  Symptoms are overall atypical for cardiac ischemia.  However, her Leane Call was highly abnormal with evidence of anterior infarct with peri-infarct ischemia and overall was a high risk study.  Based on that, left heart catheterization and possible PCI were recommended.Cath Lab Visit (complete for each Cath Lab visit)  Clinical Evaluation Leading to the Procedure:   ACS: No.  Non-ACS:    Anginal Classification: CCS III  Anti-ischemic medical therapy: Minimal Therapy (1 class of medications)  Non-Invasive Test Results: High-risk stress test findings: cardiac mortality >3%/year  Prior CABG: No previous CABG        07/05/2019 9:49 AM  Veronica Murray  has presented today for surgery, with the diagnosis of LT Cath    Abnormal stress test   Elevated troponin.  The various methods of treatment have been discussed with the patient and family. After consideration of risks, benefits and other options for treatment, the patient has consented to  Procedure(s): LEFT HEART CATH AND CORONARY ANGIOGRAPHY (Left) as a surgical intervention.  The patient's history has been reviewed, patient examined, no change in status, stable for surgery.  I have reviewed the patient's chart and labs.  Questions were answered to the patient's satisfaction.     Kathlyn Sacramento

## 2019-07-05 NOTE — Discharge Instructions (Signed)
Coronary Artery Disease, Female °Coronary artery disease (CAD) is a condition in which the arteries that lead to the heart (coronary arteries) become narrow or blocked. The narrowing or blockage can lead to decreased blood flow to the heart. Prolonged reduced blood flow can cause a heart attack (myocardial infarction or MI). This condition may also be called coronary heart disease. °Because CAD is the leading cause of death in women, it is important to understand what causes this condition and how it is treated. °What are the causes? °CAD is most often caused by atherosclerosis. This is the buildup of fat and cholesterol (plaque) on the inside of the arteries. Over time, the plaque may narrow or block the artery, reducing blood flow to the heart. Plaque can also become weak and break off within a coronary artery and cause a sudden blockage. Other less common causes of CAD include: °· A blood clot or a piece of a blood clot or other substance that blocks the flow of blood in a coronary artery (embolism). °· A tearing of the artery (spontaneous coronary artery dissection). °· An enlargement of an artery (aneurysm). °· Inflammation (vasculitis) in the artery wall. °What increases the risk? °The following factors may make you more likely to develop this condition: °· Age. Women over age 55 are at a greater risk of CAD. °· Family history of CAD. °· High blood pressure (hypertension). °· Diabetes. °· High cholesterol levels. °· Tobacco use. °· Lack of exercise. °· Menopause. °? All postmenopausal women are at greater risk of CAD. °? Women who have experienced menopause between the ages of 40-45 (early menopause) are at a higher risk of CAD. °? Women who have experienced menopause before age 40 (premature menopause) are at a very high risk of CAD. °· Excessive alcohol use. °· A diet high in saturated and trans fats, such as fried food and processed meat. °Other possible risk factors include: °· High stress  levels. °· Depression. °· Obesity. °· Sleep apnea. °What are the signs or symptoms? °Many people do not have any symptoms during the early stages of CAD. As the condition progresses, symptoms may include: °· Chest pain (angina). The pain can: °? Feel like crushing or squeezing, or like a tightness, pressure, fullness, or heaviness in the chest. °? Last more than a few minutes or can stop and recur. The pain tends to get worse with exercise or stress and to fade with rest. °· Pain in the arms, neck, jaw, ear, or back. °· Unexplained heartburn or indigestion. °· Shortness of breath. °· Nausea. °· Sudden cold sweats. °· Sudden light-headedness. °· Fluttering or fast heartbeat (palpitations). °Many women have chest discomfort and the other symptoms. However, women often have unusual (atypical) symptoms, such as: °· Fatigue. °· Vomiting. °· Unexplained feelings of nervousness or anxiety. °· Unexplained weakness. °· Dizziness or fainting. °How is this diagnosed? °This condition is diagnosed based on: °· Your family and medical history. °· A physical exam. °· Tests, including: °? A test to check the electrical signals in your heart (electrocardiogram). °? Exercise stress test. This looks for signs of blockage when the heart is stressed with exercise, such as running on a treadmill. °? Pharmacologic stress test. This test looks for signs of blockage when the heart is being stressed with a medicine. °? Blood tests. °? Coronary angiogram. This is a procedure to look at the coronary arteries to see if there is any blockage. During this test, a dye is injected into your arteries so they   appear on an X-ray. ? Coronary artery CT scan. This CT scan helps detect calcium deposits in your coronary arteries. Calcium deposits are an indicator of CAD. ? A test that uses sound waves to take a picture of your heart (echocardiogram). ? Chest X-ray. How is this treated? This condition may be treated by:  Healthy lifestyle changes to  reduce risk factors.  Medicines such as: ? Antiplatelet medicines and blood-thinning medicines, such as aspirin. These help to prevent blood clots. ? Nitroglycerin. ? Blood pressure medicines. ? Cholesterol-lowering medicine.  Coronary angioplasty and stenting. During this procedure, a thin, flexible tube is inserted through a blood vessel and into a blocked artery. A balloon or similar device on the end of the tube is inflated to open up the artery. In some cases, a small, mesh tube (stent) is inserted into the artery to keep it open.  Coronary artery bypass surgery. During this surgery, veins or arteries from other parts of the body are used to create a bypass around the blockage and allow blood to reach your heart. Follow these instructions at home: Medicines  Take over-the-counter and prescription medicines only as told by your health care provider.  Do not take the following medicines unless your health care provider approves: ? NSAIDs, such as ibuprofen, naproxen, or celecoxib. ? Vitamin supplements that contain vitamin A, vitamin E, or both. ? Hormone replacement therapy that contains estrogen with or without progestin. Lifestyle  Follow an exercise program approved by your health care provider. Aim for 150 minutes of moderate exercise or 75 minutes of vigorous exercise each week.  Maintain a healthy weight or lose weight as approved by your health care provider.  Learn to manage stress or try to limit your stress. Ask your health care provider for suggestions if you need help.  Get screened for depression and seek treatment, if needed.  Do not use any products that contain nicotine or tobacco, such as cigarettes, e-cigarettes, and chewing tobacco. If you need help quitting, ask your health care provider.  Do not use illegal drugs. Eating and drinking   Follow a heart-healthy diet. A dietitian can help educate you about healthy food options and changes. In general, eat  plenty of fruits and vegetables, lean meats, and whole grains.  Avoid foods high in: ? Sugar. ? Salt (sodium). ? Saturated fats, such as processed or fatty meat. ? Trans fats, such as fried food.  Use healthy cooking methods such as roasting, grilling, broiling, baking, poaching, steaming, or stir-frying.  Do not drink alcohol if: ? Your health care provider tells you not to drink. ? You are pregnant, may be pregnant, or are planning to become pregnant.  If you drink alcohol: ? Limit how much you have to 0-1 drink a day. ? Be aware of how much alcohol is in your drink. In the [place] one drink equals one 12 oz bottle of beer (355 mL), one 5 oz glass of wine (148 mL), or one 1 oz glass of hard liquor (44 mL). General instructions  Manage any other health conditions, such as hypertension and diabetes. These conditions affect your heart.  Your health care provider may ask you to monitor your blood pressure. Ideally, your blood pressure should be below 130/80.  Keep all follow-up visits as told by your health care provider. This is important. Get help right away if:  You have pain in your chest, neck, ear, arm, jaw, stomach, or back that: ? Lasts more than a few minutes. ?  Is recurring. ? Is not relieved by taking medicine under your tongue (sublingual nitroglycerin).  You have profuse sweating without cause.  You have unexplained: ? Heartburn or indigestion. ? Shortness of breath or difficulty breathing. ? Fluttering or fast heartbeat (palpitations). ? Nausea or vomiting. ? Fatigue. ? Feelings of nervousness or anxiety. ? Weakness. ? Diarrhea.  You have sudden light-headedness or dizziness.  You faint.  You feel like hurting yourself or think about taking your own life. These symptoms may represent a serious problem that is an emergency. Do not wait to see if the symptoms will go away. Get medical help right away. Call your local emergency services (911 in the [place] Do  not drive yourself to the hospital. Summary  Coronary artery disease (CAD) is a condition in which the arteries that lead to the heart (coronary arteries) become narrow or blocked. The narrowing or blockage can lead to a heart attack.  Many women have chest discomfort and other common symptoms of CAD. However, women often have unusual (atypical) symptoms, such as fatigue, vomiting, weakness, or dizziness.  CAD can be treated with lifestyle changes, medicines, surgery, or a combination of these treatments. This information is not intended to replace advice given to you by your health care provider. Make sure you discuss any questions you have with your health care provider. Document Released: 01/20/2012 Document Revised: 07/17/2018 Document Reviewed: 07/07/2018 Elsevier Patient Education  2020 [employer]  This sheet gives you information about how to care for yourself after your procedure. Your health care provider may also give you more specific instructions. If you have problems or questions, contact your health care provider. What can I expect after the procedure? After the procedure, it is common to have:  Bruising and tenderness at the catheter insertion area. Follow these instructions at home: Medicines  Take over-the-counter and prescription medicines only as told by your health care provider. Insertion site care  Follow instructions from your health care provider about how to take care of your insertion site. Make sure you: ? Wash your hands with soap and water before you change your bandage (dressing). If soap and water are not available, use hand sanitizer. ? Change your dressing as told by your health care provider. ? Leave stitches (sutures), skin glue, or adhesive strips in place. These skin closures may need to stay in place for 2 weeks or longer. If adhesive strip edges start to loosen and curl up, you may trim the loose edges. Do not remove  adhesive strips completely unless your health care provider tells you to do that.  Check your insertion site every day for signs of infection. Check for: ? Redness, swelling, or pain. ? Fluid or blood. ? Pus or a bad smell. ? Warmth.  Do not take baths, swim, or use a hot tub until your health care provider approves.  You may shower 24-48 hours after the procedure, or as directed by your health care provider. ? Remove the dressing and gently wash the site with plain soap and water. ? Pat the area dry with a clean towel. ? Do not rub the site. That could cause bleeding.  Do not apply powder or lotion to the site. Activity   For 24 hours after the procedure, or as directed by your health care provider: ? Do not flex or bend the affected arm. ? Do not push or pull heavy objects with the affected arm. ? Do not drive yourself home  from the hospital or clinic. You may drive 24 hours after the procedure unless your health care provider tells you not to. ? Do not operate machinery or power tools.  Do not lift anything that is heavier than 10 lb (4.5 kg), or the limit that you are told, until your health care provider says that it is safe.  Ask your health care provider when it is okay to: ? Return to work or school. ? Resume usual physical activities or sports. ? Resume sexual activity. General instructions  If the catheter site starts to bleed, raise your arm and put firm pressure on the site. If the bleeding does not stop, get help right away. This is a medical emergency.  If you went home on the same day as your procedure, a responsible adult should be with you for the first 24 hours after you arrive home.  Keep all follow-up visits as told by your health care provider. This is important. Contact a health care provider if:  You have a fever.  You have redness, swelling, or yellow drainage around your insertion site. Get help right away if:  You have unusual pain at the radial  site.  The catheter insertion area swells very fast.  The insertion area is bleeding, and the bleeding does not stop when you hold steady pressure on the area.  Your arm or hand becomes pale, cool, tingly, or numb. These symptoms may represent a serious problem that is an emergency. Do not wait to see if the symptoms will go away. Get medical help right away. Call your local emergency services (911 in the [place] Do not drive yourself to the hospital. Summary  After the procedure, it is common to have bruising and tenderness at the site.  Follow instructions from your health care provider about how to take care of your radial site wound. Check the wound every day for signs of infection.  Do not lift anything that is heavier than 10 lb (4.5 kg), or the limit that you are told, until your health care provider says that it is safe. This information is not intended to replace advice given to you by your health care provider. Make sure you discuss any questions you have with your health care provider. Document Released: 11/30/2010 Document Revised: 12/03/2017 Document Reviewed: 12/03/2017 Elsevier Patient Education  2020 [employer]    Angiogram, Care After This sheet gives you information about how to care for yourself after your procedure. Your doctor may also give you more specific instructions. If you have problems or questions, contact your doctor. Follow these instructions at home: Insertion site care  Follow instructions from your doctor about how to take care of your long, thin tube (catheter) insertion area. Make sure you: ? Wash your hands with soap and water before you change your bandage (dressing). If you cannot use soap and water, use hand sanitizer. ? Change your bandage as told by your doctor. ? Leave stitches (sutures), skin glue, or skin tape (adhesive) strips in place. They may need to stay in place for 2 weeks or longer. If tape strips get loose and curl up, you may trim  the loose edges. Do not remove tape strips completely unless your doctor says it is okay.  Do not take baths, swim, or use a hot tub until your doctor says it is okay.  You may shower 24-48 hours after the procedure or as told by your doctor. ? Gently wash the area with plain soap and  water. ? Pat the area dry with a clean towel. ? Do not rub the area. This may cause bleeding.  Do not apply powder or lotion to the area. Keep the area clean and dry.  Check your insertion area every day for signs of infection. Check for: ? More redness, swelling, or pain. ? Fluid or blood. ? Warmth. ? Pus or a bad smell. Activity  Rest as told by your doctor, usually for 1-2 days.  Do not lift anything that is heavier than 10 lbs. (4.5 kg) or as told by your doctor.  Do not drive for 24 hours if you were given a medicine to help you relax (sedative).  Do not drive or use heavy machinery while taking prescription pain medicine. General instructions   Go back to your normal activities as told by your doctor, usually in about a week. Ask your doctor what activities are safe for you.  If the insertion area starts to bleed, lie flat and put pressure on the area. If the bleeding does not stop, get help right away. This is an emergency.  Drink enough fluid to keep your pee (urine) clear or pale yellow.  Take over-the-counter and prescription medicines only as told by your doctor.  Keep all follow-up visits as told by your doctor. This is important. Contact a doctor if:  You have a fever.  You have chills.  You have more redness, swelling, or pain around your insertion area.  You have fluid or blood coming from your insertion area.  The insertion area feels warm to the touch.  You have pus or a bad smell coming from your insertion area.  You have more bruising around the insertion area.  Blood collects in the tissue around the insertion area (hematoma) that may be painful to the touch. Get  help right away if:  You have a lot of pain in the insertion area.  The insertion area swells very fast.  The insertion area is bleeding, and the bleeding does not stop after holding steady pressure on the area.  The area near or just beyond the insertion area becomes pale, cool, tingly, or numb. These symptoms may be an emergency. Do not wait to see if the symptoms will go away. Get medical help right away. Call your local emergency services (911 in the [place] Do not drive yourself to the hospital. Summary  After the procedure, it is common to have bruising and tenderness at the long, thin tube insertion area.  After the procedure, it is important to rest and drink plenty of fluids.  Do not take baths, swim, or use a hot tub until your doctor says it is okay to do so. You may shower 24-48 hours after the procedure or as told by your doctor.  If the insertion area starts to bleed, lie flat and put pressure on the area. If the bleeding does not stop, get help right away. This is an emergency. This information is not intended to replace advice given to you by your health care provider. Make sure you discuss any questions you have with your health care provider. Document Released: 01/24/2009 Document Revised: 10/10/2017 Document Reviewed: 10/22/2016 Elsevier Patient Education  2020 [employer]    Moderate Conscious Sedation, Adult, Care After These instructions provide you with information about caring for yourself after your procedure. Your health care provider may also give you more specific instructions. Your treatment has been planned according to current medical practices, but problems sometimes occur. Call  your health care provider if you have any problems or questions after your procedure. What can I expect after the procedure? After your procedure, it is common:  To feel sleepy for several hours.  To feel clumsy and have poor balance for several hours.  To have poor judgment  for several hours.  To vomit if you eat too soon. Follow these instructions at home: For at least 24 hours after the procedure:   Do not: ? Participate in activities where you could fall or become injured. ? Drive. ? Use heavy machinery. ? Drink alcohol. ? Take sleeping pills or medicines that cause drowsiness. ? Make important decisions or sign legal documents. ? Take care of children on your own.  Rest. Eating and drinking  Follow the diet recommended by your health care provider.  If you vomit: ? Drink water, juice, or soup when you can drink without vomiting. ? Make sure you have little or no nausea before eating solid foods. General instructions  Have a responsible adult stay with you until you are awake and alert.  Take over-the-counter and prescription medicines only as told by your health care provider.  If you smoke, do not smoke without supervision.  Keep all follow-up visits as told by your health care provider. This is important. Contact a health care provider if:  You keep feeling nauseous or you keep vomiting.  You feel light-headed.  You develop a rash.  You have a fever. Get help right away if:  You have trouble breathing. This information is not intended to replace advice given to you by your health care provider. Make sure you discuss any questions you have with your health care provider. Document Released: 08/18/2013 Document Revised: 10/10/2017 Document Reviewed: 02/17/2016 Elsevier Patient Education  2020 [employer]

## 2019-07-05 NOTE — Discharge Summary (Signed)
[facility] [facility] [place] at [facility]   PATIENT NAME: Veronica Murray    MR#:  [id number]  DATE OF BIRTH:  [birth year]  DATE OF ADMISSION:  06/25/2019 ADMITTING PHYSICIAN: Milagros LollSrikar Katyra Tomassetti, MD  DATE OF DISCHARGE: 06/26/2019  4:05 PM  PRIMARY CARE PHYSICIAN: Patient, No Pcp Per   ADMISSION DIAGNOSIS:  Arm pain [M79.603] Right arm pain [M79.601] Elevated troponin I level [R79.89]  DISCHARGE DIAGNOSIS:  Active Problems:   Troponin I above reference range   SECONDARY DIAGNOSIS:   Past Medical History:  Diagnosis Date  . Aortic arch atherosclerosis (HCC)   . Diabetes mellitus without complication (HCC)   . Essential hypertension   . Tobacco abuse      ADMITTING HISTORY  HISTORY OF PRESENT ILLNESS:  Veronica CraftMargaret Mcconaughy  is a 63 y.o. female with a known history of diabetes mellitus, fibromyalgia presents to the emergency room complaining of 3 days of right arm pain.  This is mainly over her triceps area and seems to start in the shoulder area and radiates down.  This is not tender.  She feels better when she lifts her arm over her head.  More pain when she hangs it down with gravity.  No swelling or redness.  No chest pain or shortness of breath.  She has had some nausea and vomiting which is now resolved.  No diarrhea.  No abdominal pain.  Here in the emergency room she was found to have elevated troponin of 187 followed by 177.  EKG showed nothing acute.  CT scan of the chest did not show any dissection. She has required multiple doses of Dilaudid.  She is allergic to Toradol and codeine.   HOSPITAL COURSE:   *  Right arm musculoskeletal pain *  Mild elevation troponin *  Abnormal stress test *  Diabetes mellitus *  Fibro myalgia   patient was admitted to telemetry floor and troponin repeated.  This was mildly elevated.  A stress test was ordered and Cardiology consulted.  Stress test returned abnormal and up patient was advised to stay in the hospital for cardiac  catheterization by me.  Patient wanted to leave and come back for an outpatient catheterization.  Case was discussed with Dr. Excell Seltzerooper of Cardiology.  At this time after discussing with him we feel that the abnormal stress test was an incidental finding and her right arm pain is very typical for musculoskeletal pain and not related.  Patient was treated symptomatically.  Discharged home with appointment to follow-up with Dr.   Benard RinkAredia of Cardiology for an outpatient  Cardiac catheterization.    Started on aspirin, statin, beta-blocker.  CONSULTS OBTAINED:  Treatment Team:  Tonny Bollmanooper, Michael, MD  DRUG ALLERGIES:   Allergies  Allergen Reactions  . Codeine Hives, Shortness Of Breath and Nausea Only  . Toradol [Ketorolac Tromethamine] Hives, Shortness Of Breath and Nausea Only  . Tramadol Hcl Shortness Of Breath and Nausea Only    "Conflicts with bipolar condition"    DISCHARGE MEDICATIONS:   Allergies as of 06/26/2019      Reactions   Codeine Hives      Medication List    TAKE these medications   aspirin 81 MG EC tablet Take 1 tablet (81 mg total) by mouth daily.   atorvastatin 20 MG tablet Commonly known as: LIPITOR Take 1 tablet (20 mg total) by mouth daily at 6 PM.   gabapentin 300 MG capsule Commonly known as: NEURONTIN Take 1 capsule (300 mg total) by mouth 2 (  two) times daily.   Insulin Pen Needle 30G X 8 MM Misc Commonly known as: NOVOFINE Inject 10 each into the skin as needed.   metoprolol tartrate 25 MG tablet Commonly known as: LOPRESSOR Take 0.5 tablets (12.5 mg total) by mouth 2 (two) times daily.   oxyCODONE-acetaminophen 5-325 MG tablet Commonly known as: PERCOCET/ROXICET Take 1 tablet by mouth every 8 (eight) hours as needed for severe pain.       Today   VITAL SIGNS:  Blood pressure 105/67, pulse 86, temperature 98.1 F (36.7 C), temperature source Oral, resp. rate 19, height 5\' 2"  (1.575 m), weight 75.9 kg, SpO2 97 %.  I/O:  No intake or output  data in the 24 hours ending 07/05/19 1732  PHYSICAL EXAMINATION:  Physical Exam  GENERAL:  63 y.o.-year-old patient lying in the bed with no acute distress.  LUNGS: Normal breath sounds bilaterally, no wheezing, rales,rhonchi or crepitation. No use of accessory muscles of respiration.  CARDIOVASCULAR: S1, S2 normal. No murmurs, rubs, or gallops.  ABDOMEN: Soft, non-tender, non-distended. Bowel sounds present. No organomegaly or mass.  NEUROLOGIC: Moves all 4 extremities. PSYCHIATRIC: The patient is alert and oriented x 3.  SKIN: No obvious rash, lesion, or ulcer.   DATA REVIEW:   CBC No results for input(s): WBC, HGB, HCT, PLT in the last 168 hours.  Chemistries  No results for input(s): NA, K, CL, CO2, GLUCOSE, BUN, CREATININE, CALCIUM, MG, AST, ALT, ALKPHOS, BILITOT in the last 168 hours.  Invalid input(s): GFRCGP  Cardiac Enzymes No results for input(s): TROPONINI in the last 168 hours.  Microbiology Results  Results for orders placed or performed during the hospital encounter of 06/25/19  SARS Coronavirus 2 [facility] order, Performed in [facility] hospital lab) Nasopharyngeal Nasopharyngeal Swab     Status: None   Collection Time: 06/25/19  4:00 PM   Specimen: Nasopharyngeal Swab  Result Value Ref Range Status   SARS Coronavirus 2 NEGATIVE NEGATIVE Final    Comment: (NOTE) If result is NEGATIVE SARS-CoV-2 target nucleic acids are NOT DETECTED. The SARS-CoV-2 RNA is generally detectable in upper and lower  respiratory specimens during the acute phase of infection. The lowest  concentration of SARS-CoV-2 viral copies this assay can detect is 250  copies / mL. A negative result does not preclude SARS-CoV-2 infection  and should not be used as the sole basis for treatment or other  patient management decisions.  A negative result may occur with  improper specimen collection / handling, submission of specimen other  than nasopharyngeal swab, presence of viral mutation(s)  within the  areas targeted by this assay, and inadequate number of viral copies  (<250 copies / mL). A negative result must be combined with clinical  observations, patient history, and epidemiological information. If result is POSITIVE SARS-CoV-2 target nucleic acids are DETECTED. The SARS-CoV-2 RNA is generally detectable in upper and lower  respiratory specimens dur ing the acute phase of infection.  Positive  results are indicative of active infection with SARS-CoV-2.  Clinical  correlation with patient history and other diagnostic information is  necessary to determine patient infection status.  Positive results do  not rule out bacterial infection or co-infection with other viruses. If result is PRESUMPTIVE POSTIVE SARS-CoV-2 nucleic acids MAY BE PRESENT.   A presumptive positive result was obtained on the submitted specimen  and confirmed on repeat testing.  While 2019 novel coronavirus  (SARS-CoV-2) nucleic acids may be present in the submitted sample  additional confirmatory testing may  be necessary for epidemiological  and / or clinical management purposes  to differentiate between  SARS-CoV-2 and other Sarbecovirus currently known to infect humans.  If clinically indicated additional testing with an alternate test  methodology [phone]([id number]) is advised. The SARS-CoV-2 RNA is generally  detectable in upper and lower respiratory sp ecimens during the acute  phase of infection. The expected result is Negative. Fact Sheet for Patients:  BoilerBrush.com.cyhttps://www.fda.gov/media/136312/download Fact Sheet for Healthcare Providers: https://pope.com/https://www.fda.gov/media/136313/download This test is not yet approved or cleared by the [place] FDA and has been authorized for detection and/or diagnosis of SARS-CoV-2 by FDA under an Emergency Use Authorization (EUA).  This EUA will remain in effect (meaning this test can be used) for the duration of the COVID-19 declaration under Section 564(b)(1) of the Act,  21 U.S.C. section 360bbb-3(b)(1), unless the authorization is terminated or revoked sooner. Performed at [facility], [address] [place], [place] [address]     RADIOLOGY:  No results found.  Follow up with PCP in 1 week.  Management plans discussed with the patient, family and they are in agreement.  CODE STATUS:  Code Status History    Date Active Date Inactive Code Status Order ID Comments User Context   06/25/2019 1526 06/26/2019 1911 Full Code [id number]  Milagros LollSudini, Deyana Wnuk, MD ED   Advance Care Planning Activity      TOTAL TIME TAKING CARE OF THIS PATIENT ON DAY OF DISCHARGE: more than 30 minutes.   Molinda BailiffSrikar R Delrae Hagey M.D on 07/05/2019 at 5:32 PM  Between 7am to 6pm - Pager - [phone]  After 6pm go to [email or web address] - password [email or web address]  [facility]  Office  [phone]  CC: Primary care physician; Patient, No Pcp Per  Note: This dictation was prepared with Dragon dictation along with smaller phrase technology. Any transcriptional errors that result from this process are unintentional.

## 2019-08-17 ENCOUNTER — Ambulatory Visit: Payer: Medicare HMO | Admitting: Cardiovascular Disease

## 2019-08-19 ENCOUNTER — Ambulatory Visit: Payer: Medicare HMO | Admitting: Cardiology

## 2019-08-19 ENCOUNTER — Telehealth: Payer: Self-pay | Admitting: Cardiovascular Disease

## 2019-08-19 NOTE — Telephone Encounter (Signed)
° ° °  Pt c/o Shortness Of Breath: STAT if SOB developed within the last 24 hours or pt is noticeably SOB on the phone  1. Are you currently SOB (can you hear that pt is SOB on the phone)? no  2. How long have you been experiencing SOB? 1 week  3. Are you SOB when sitting or when up moving around? Moving around  4. Are you currently experiencing any other symptoms? Arm pain

## 2019-08-19 NOTE — Telephone Encounter (Signed)
Patient states she is having some arm pain, and some SOB- she just recently had a CATH by Dr.Arida- she had appointment 10/06- but had to cancel due to the appointment being in [place]. She would like to be seen in [place] office-no openings and with patient issues I felt she should be evaluated by someone. I did place her on schedule for NP next week. Pain in her arm causes her other issues (nausea) She states that at times she feels she can't breath, she is unsure if it is stress related or anxiety BP has been elevated 225/? Unable to remember bottom number- day before yesterday.  I did advise patient I could get her to be seen next week- but if over the weekend she began to have chest pain, the arm pain does not go away, neck pain, SOB or swelling that she go to the ER to be evaluated and not wait for this appointment. Patient verbalized understanding, thankful for call.

## 2019-08-24 ENCOUNTER — Ambulatory Visit: Payer: Medicare HMO | Admitting: Adult Health

## 2019-08-24 NOTE — Progress Notes (Deleted)
Cardiology Office Note   Date:  08/24/2019   ID:  Veronica, Murray 1956/07/17, MRN [id number]  PCP:  Patient, No Pcp Per  Cardiologist: Dr.Cooper  No chief complaint on file.    History of Present Illness: Veronica Murray is a 63 y.o. female who presents for ongoing assessment and management of nonobstructive coronary artery disease, with abnormal cardiac stress test in August 2020 leading to cardiac catheterization.  Other history includes type 2 diabetes, hyperlipidemia, hypertension, and ongoing tobacco abuse.  Cardiac catheterization was completed on 07/05/2019 by Dr. Kirke Corin, this revealed severe one-vessel coronary artery disease with an occluded first diagonal which seen to be the culprit for the abnormal stress test, additionally she had moderate three-vessel disease.  Ejection fraction was 45% to 50% by visual estimate.  She was to continue aggressive medical therapy, no intervention was necessary.  LDL was 175 on precatheterization labs, with a total cholesterol of 269, triglycerides 314.  Veronica Murray called our office on 08/19/2019 with complaints of right arm pain and some shortness of breath, since having had cardiac catheterization.  She did have a follow-up appointment scheduled with Dr. Kirke Corin in [place] on 08/17/2019, but wished to be followed at the [facility] office as this is closer to her home.  She also reported that her blood pressure was elevated and she was having associated chest pressure.  Past Medical History:  Diagnosis Date  . Aortic arch atherosclerosis (HCC)   . Diabetes mellitus without complication (HCC)   . Essential hypertension   . Tobacco abuse     Past Surgical History:  Procedure Laterality Date  . LEFT HEART CATH AND CORONARY ANGIOGRAPHY Left 07/05/2019   Procedure: LEFT HEART CATH AND CORONARY ANGIOGRAPHY;  Surgeon: Iran Ouch, MD;  Location: [facility] INVASIVE CV LAB;  Service: Cardiovascular;  Laterality: Left;     Current Outpatient  Medications  Medication Sig Dispense Refill  . albuterol (VENTOLIN HFA) 108 (90 Base) MCG/ACT inhaler Inhale 1-2 puffs into the lungs every 6 (six) hours as needed for wheezing or shortness of breath.    Marland Kitchen aspirin EC 81 MG EC tablet Take 1 tablet (81 mg total) by mouth daily. 30 tablet 0  . atorvastatin (LIPITOR) 20 MG tablet Take 1 tablet (20 mg total) by mouth daily at 6 PM. 30 tablet 0  . gabapentin (NEURONTIN) 300 MG capsule Take 1 capsule (300 mg total) by mouth 2 (two) times daily. 60 capsule 0  . Insulin Pen Needle (NOVOFINE) 30G X 8 MM MISC Inject 10 each into the skin as needed. 100 each 0  . Menthol, Topical Analgesic, (BIOFREEZE EX) Apply 1 application topically 2 (two) times daily as needed (pain).    . metoprolol tartrate (LOPRESSOR) 25 MG tablet Take 0.5 tablets (12.5 mg total) by mouth 2 (two) times daily. 60 tablet 0  . oxyCODONE-acetaminophen (PERCOCET/ROXICET) 5-325 MG tablet Take 1 tablet by mouth every 8 (eight) hours as needed for severe pain. (Patient not taking: Reported on 07/05/2019) 15 tablet 0   No current facility-administered medications for this visit.     Allergies:   Codeine, Toradol [ketorolac tromethamine], and Tramadol hcl    Social History:  The patient  reports that she has been smoking. She has been smoking about 1.00 pack per day. She has never used smokeless tobacco. She reports previous alcohol use.   Family History:  The patient's family history is not on file.    ROS: All other systems are reviewed and negative. Unless otherwise  mentioned in H&P    PHYSICAL EXAM: VS:  There were no vitals taken for this visit. , BMI There is no height or weight on file to calculate BMI. GEN: Well nourished, well developed, in no acute distress HEENT: normal Neck: no JVD, carotid bruits, or masses Cardiac: ***RRR; no murmurs, rubs, or gallops,no edema  Respiratory:  Clear to auscultation bilaterally, normal work of breathing GI: soft, nontender, nondistended, +  BS MS: no deformity or atrophy Skin: warm and dry, no rash Neuro:  Strength and sensation are intact Psych: euthymic mood, full affect   EKG:  EKG {ACTION; IS/IS [id number]} ordered today. The ekg ordered today demonstrates ***   Recent Labs: 06/25/2019: ALT 19 06/26/2019: BUN 25; Creatinine, Ser 0.69; Hemoglobin 14.3; Platelets 386; Potassium 3.7; Sodium 129    Lipid Panel    Component Value Date/Time   CHOL 269 (H) 06/26/2019 0434   TRIG 314 (H) 06/26/2019 0434   HDL 31 (L) 06/26/2019 0434   CHOLHDL 8.7 06/26/2019 0434   VLDL 63 (H) 06/26/2019 0434   LDLCALC 175 (H) 06/26/2019 0434      Wt Readings from Last 3 Encounters:  07/05/19 163 lb (73.9 kg)  06/26/19 167 lb 4.8 oz (75.9 kg)      Other studies Reviewed: [facility] 07/05/2019  There is mild left ventricular systolic dysfunction.  LV end diastolic pressure is mildly elevated.  The left ventricular ejection fraction is 45-50% by visual estimate.  Prox RCA lesion is 60% stenosed.  Mid RCA lesion is 20% stenosed.  Mid Cx lesion is 60% stenosed.  Prox LAD lesion is 40% stenosed.  1st Diag lesion is 100% stenosed.  Mid LAD lesion is 20% stenosed.   1.  Severe one-vessel coronary artery disease with an occluded first diagonal which seems to be the culprit for abnormal stress test and wall motion abnormality noted.  In addition, there is moderate three-vessel disease. 2.  Mildly reduced LV systolic function with anterolateral akinesis that fits the distribution of the first diagonal. 3.  Mildly elevated left ventricular end-diastolic pressure.  Recommendations: Continue aggressive medical therapy.  Chronically occluded first diagonal.  No indication for revascularization.  ASSESSMENT AND PLAN:  1.  ***   Current medicines are reviewed at length with the patient today.    Labs/ tests ordered today include: *** Veronica Murray. Veronica Murray, ANP, AACC   08/24/2019 7:39 AM    [facility]  [facility] [address] Office [phone] Fax [phone]  Notice: This dictation was prepared with Dragon dictation along with smaller phrase technology. Any transcriptional errors that result from this process are unintentional and may not be corrected upon review.

## 2021-03-05 DIAGNOSIS — F32A Depression, unspecified: Secondary | ICD-10-CM | POA: Insufficient documentation

## 2021-03-05 DIAGNOSIS — M797 Fibromyalgia: Secondary | ICD-10-CM | POA: Insufficient documentation

## 2021-03-05 DIAGNOSIS — F319 Bipolar disorder, unspecified: Secondary | ICD-10-CM | POA: Insufficient documentation

## 2021-03-05 DIAGNOSIS — F17200 Nicotine dependence, unspecified, uncomplicated: Secondary | ICD-10-CM | POA: Insufficient documentation

## 2021-03-05 DIAGNOSIS — F410 Panic disorder [episodic paroxysmal anxiety] without agoraphobia: Secondary | ICD-10-CM | POA: Insufficient documentation

## 2021-12-19 ENCOUNTER — Other Ambulatory Visit: Payer: Self-pay | Admitting: Ophthalmology

## 2021-12-19 DIAGNOSIS — H3582 Retinal ischemia: Secondary | ICD-10-CM

## 2021-12-25 ENCOUNTER — Ambulatory Visit
Admission: RE | Admit: 2021-12-25 | Discharge: 2021-12-25 | Disposition: A | Discharge status: Home / Self Care | Payer: Medicare HMO | Source: Ambulatory Visit | Attending: Ophthalmology | Admitting: Ophthalmology

## 2021-12-25 ENCOUNTER — Other Ambulatory Visit: Payer: Self-pay

## 2021-12-25 DIAGNOSIS — H3582 Retinal ischemia: Secondary | ICD-10-CM | POA: Insufficient documentation

## 2021-12-25 IMAGING — US US CAROTID DUPLEX BILAT
1 series · 13 of 24 positions shown · non-contrast
Comparison: None.

CLINICAL DATA: 65-year-old female with ocular ischemic syndrome

EXAM:
BILATERAL CAROTID DUPLEX ULTRASOUND
TECHNIQUE: Gray scale imaging, color Doppler and duplex ultrasound were
performed of bilateral carotid and vertebral arteries in the neck.

[Series 1: us carotid bilateral · 13 of 68 slices shown]
[im 1/68]
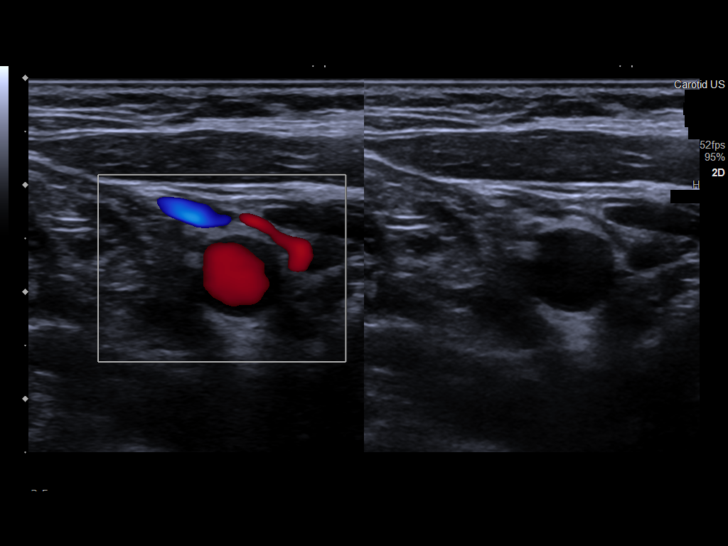
[im 6/68]
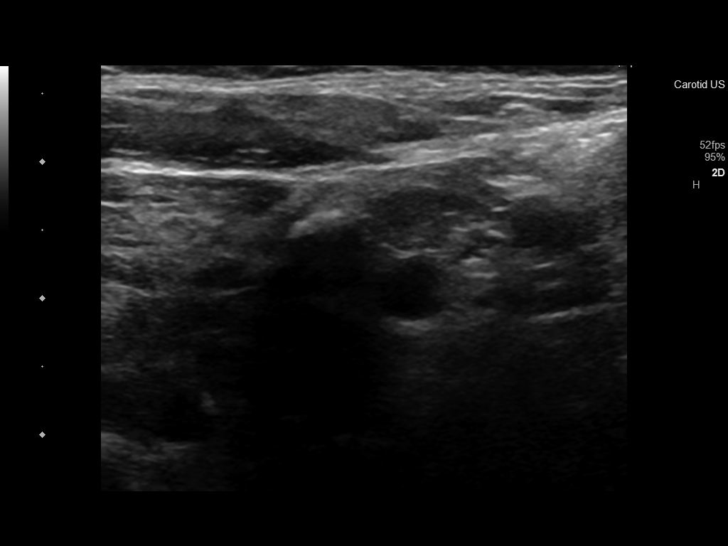
[im 12/68]
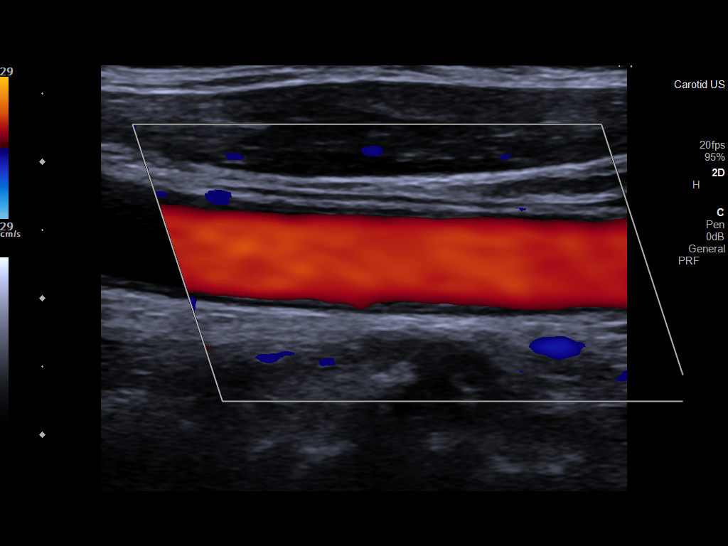
[im 18/68]
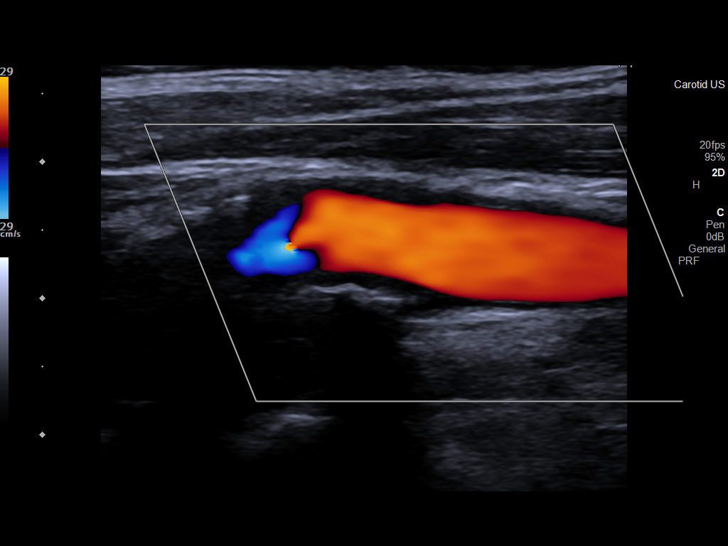
[im 24/68]
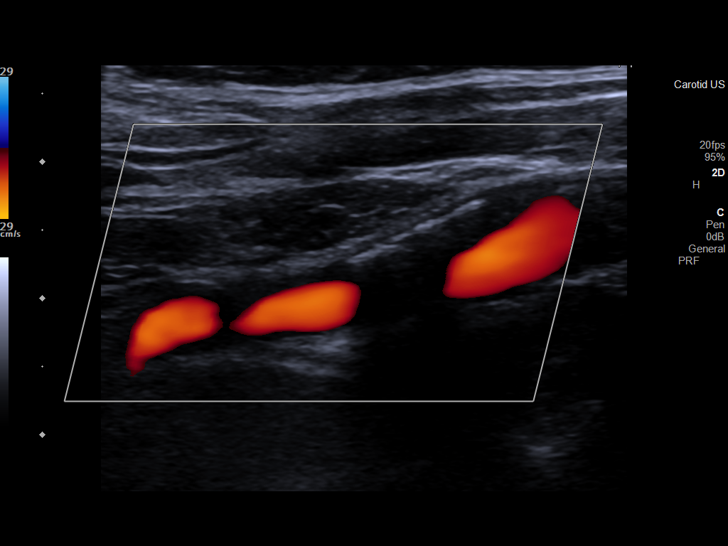
[im 30/68]
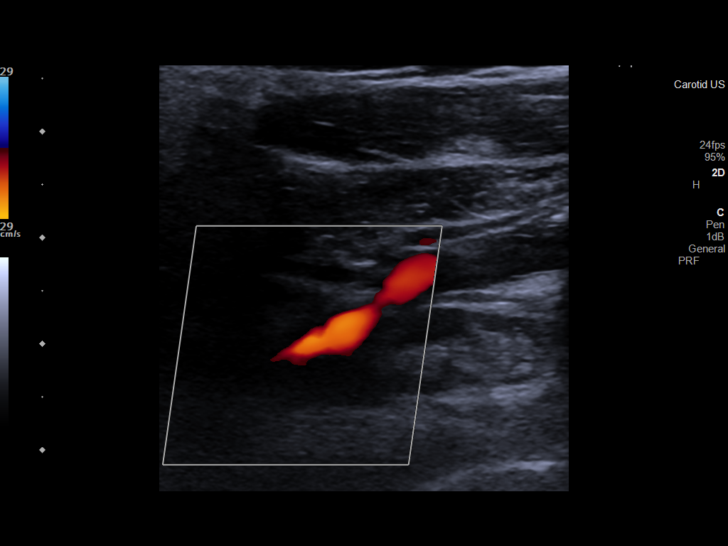
[im 35/68]
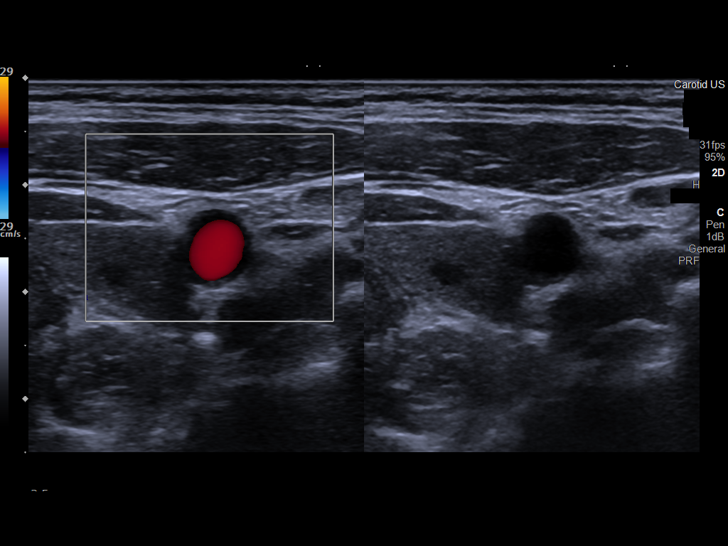
[im 38/68]
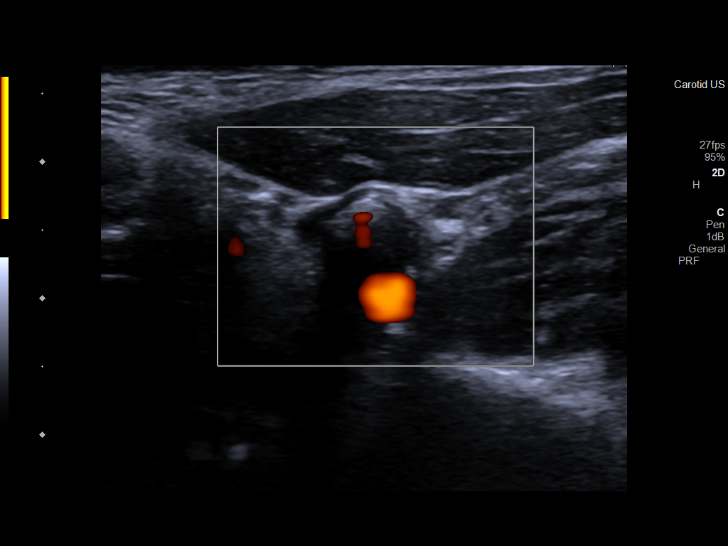
[im 44/68]
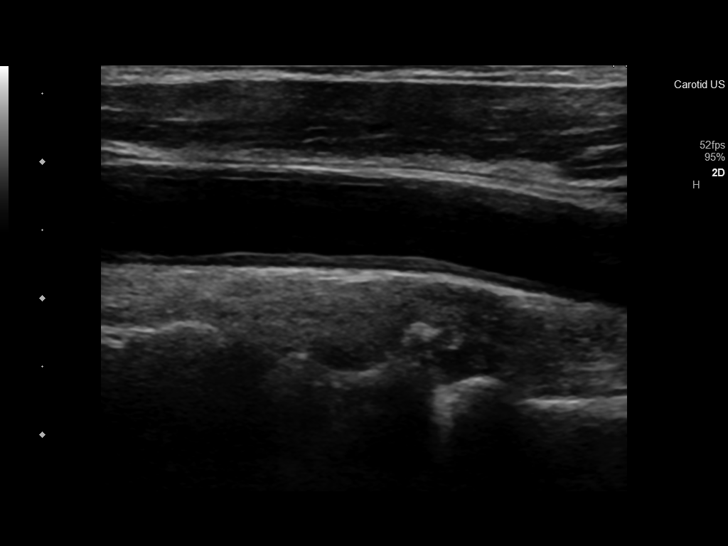
[im 50/68]
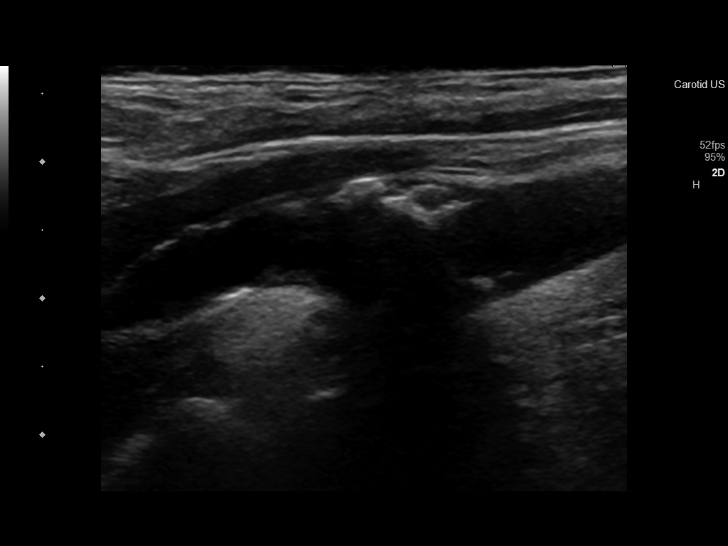
[im 56/68]
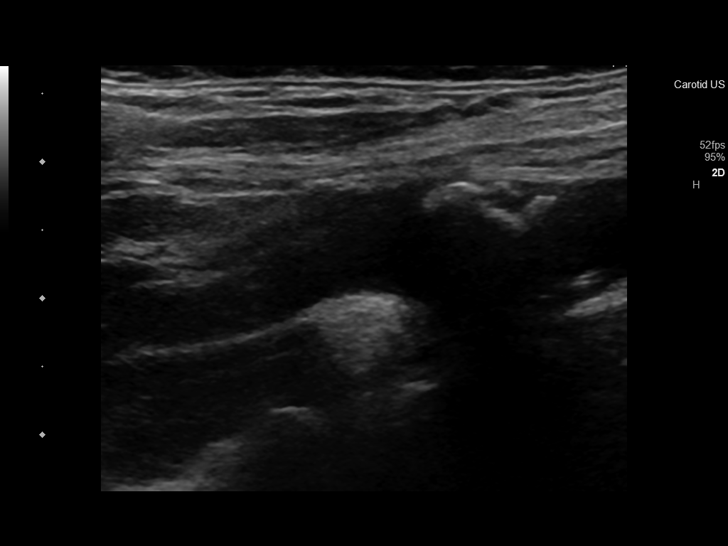
[im 62/68]
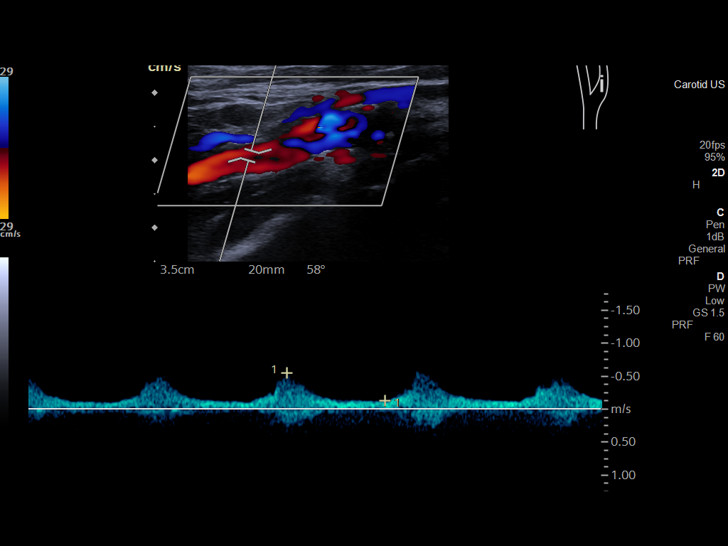
[im 68/68]
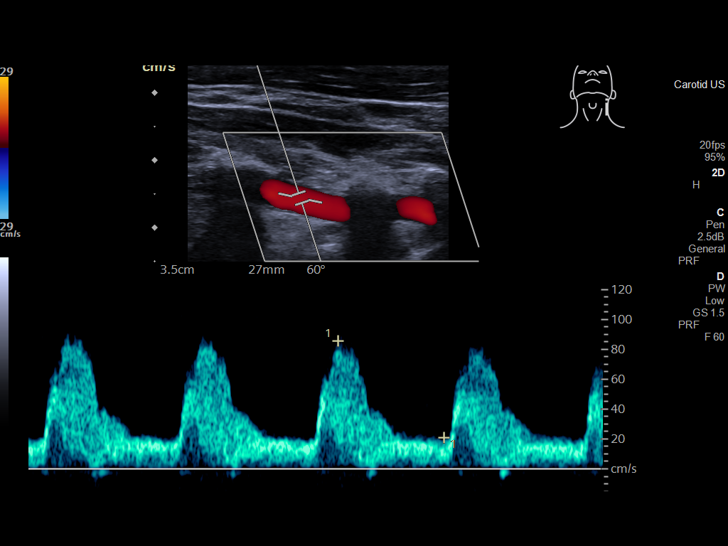

[13 of 24 positions shown; findings below may reference images not displayed]

FINDINGS: Criteria: Quantification of carotid stenosis is based on velocity
parameters that correlate the residual internal carotid diameter
with NASCET-based stenosis levels, using the diameter of the distal
internal carotid lumen as the denominator for stenosis measurement.

The following velocity measurements were obtained:

RIGHT

ICA:  Systolic 187 cm/sec, Diastolic 43 cm/sec

CCA:  75 cm/sec

SYSTOLIC ICA/CCA RATIO:

ECA:  126 cm/sec

LEFT

ICA:  Systolic 254 cm/sec, Diastolic 57 cm/sec

CCA:  29 cm/sec

SYSTOLIC ICA/CCA RATIO:

ECA:  66 cm/sec

Right Brachial SBP: Not acquired

Left Brachial SBP: Not acquired

RIGHT CAROTID ARTERY: Atherosclerotic changes of the right common
carotid artery. Intermediate waveform maintained. Moderate
heterogeneous and partially calcified plaque at the right carotid
bifurcation. Lumen shadowing present low resistance waveform of the
right ICA. No significant tortuosity.

RIGHT VERTEBRAL ARTERY: Antegrade flow with low resistance waveform.

LEFT CAROTID ARTERY: Atherosclerotic changes of the left common
carotid artery. The velocity of the distal common carotid artery
measured 29 centimeter/second, beyond some atherosclerotic changes
of the mid CCA. Intermediate waveform maintained in the distal CCA.
Moderate heterogeneous and partially calcified plaque at the left
carotid bifurcation. Lumen shadowing. Parvus tardus waveform at the
bifurcation and in the left ICA.

LEFT VERTEBRAL ARTERY:  Antegrade flow with low resistance waveform.
IMPRESSION: Right:

Heterogeneous and partially calcified plaque at the right carotid
bifurcation contributes to 50%-69% stenosis by established duplex
criteria. Note that the flow velocities of the right ICA were
obtained from an area distal to the maximum narrowing due to the
presence of anterior wall plaque with shadowing and may be
underestimating the percentage of ICA stenosis.

Left:

Heterogeneous and partially calcified plaque at the left carotid
bifurcation contributes to 70%-99% stenosis by established duplex
criteria. Note that the left ICA waveform connotes a critical
stenosis, on the higher end of this spectrum.

## 2022-01-18 ENCOUNTER — Encounter (INDEPENDENT_AMBULATORY_CARE_PROVIDER_SITE_OTHER): Payer: Self-pay | Admitting: Vascular Surgery

## 2022-01-18 ENCOUNTER — Telehealth (INDEPENDENT_AMBULATORY_CARE_PROVIDER_SITE_OTHER): Payer: Self-pay

## 2022-01-18 ENCOUNTER — Ambulatory Visit (INDEPENDENT_AMBULATORY_CARE_PROVIDER_SITE_OTHER): Payer: Medicare HMO | Admitting: Vascular Surgery

## 2022-01-18 ENCOUNTER — Other Ambulatory Visit: Payer: Self-pay

## 2022-01-18 DIAGNOSIS — I10 Essential (primary) hypertension: Secondary | ICD-10-CM | POA: Diagnosis not present

## 2022-01-18 DIAGNOSIS — I6523 Occlusion and stenosis of bilateral carotid arteries: Secondary | ICD-10-CM

## 2022-01-18 DIAGNOSIS — I2510 Atherosclerotic heart disease of native coronary artery without angina pectoris: Secondary | ICD-10-CM | POA: Insufficient documentation

## 2022-01-18 DIAGNOSIS — I6529 Occlusion and stenosis of unspecified carotid artery: Secondary | ICD-10-CM | POA: Insufficient documentation

## 2022-01-18 MED ORDER — CLOPIDOGREL BISULFATE 75 MG PO TABS: 75.0000 mg | ORAL_TABLET | Freq: Every day | ORAL | 6 refills | Status: DC

## 2022-01-18 MED ORDER — ROSUVASTATIN CALCIUM 10 MG PO TABS: 10.0000 mg | ORAL_TABLET | Freq: Every day | ORAL | 5 refills | Status: DC

## 2022-01-18 MED ORDER — ASPIRIN EC 81 MG PO TBEC: 81.0000 mg | DELAYED_RELEASE_TABLET | Freq: Every day | ORAL | 2 refills | Status: DC

## 2022-01-18 NOTE — Assessment & Plan Note (Signed)
blood pressure control important in reducing the progression of atherosclerotic disease. On appropriate oral medications.  

## 2022-01-18 NOTE — Progress Notes (Signed)
Patient ID: Veronica Murray, female   DOB: 1955/11/28, 66 y.o.   MRN: [id number]  Chief Complaint  Patient presents with   New Patient (Initial Visit)    Consult for ultrasound    HPI Veronica Murray is a 66 y.o. female.  I am asked to see the patient by Dr. Lazarus Salines for evaluation of carotid stenosis with multiple amaurosis fugax episodes over the past couple of weeks.  The patient has a previous history of heart disease with coronary intervention about 2 to 3 years ago.  These amaurosis episodes have been multiple.  The patient reports headaches associated with the events.  She denies any fevers or chills.  No arm or leg weakness or numbness.  No speech or swallowing difficulty.  She had a carotid duplex which I have reviewed which suggest 50 to 69% right ICA stenosis and greater than 70% left ICA stenosis.  As such, she is referred for further evaluation and treatment   Past Medical History:  Diagnosis Date   Aortic arch atherosclerosis ([place])    Diabetes mellitus without complication ([place])    Essential hypertension    Tobacco abuse     Past Surgical History:  Procedure Laterality Date   LEFT HEART CATH AND CORONARY ANGIOGRAPHY Left 07/05/2019   Procedure: LEFT HEART CATH AND CORONARY ANGIOGRAPHY;  Surgeon: Wellington Hampshire, MD;  Location: [facility];  Service: Cardiovascular;  Laterality: Left;    Family History  Problem Relation Age of Onset   CAD Neg Hx   No bleeding disorders, clotting disorders, or aneurysms   Social History   Tobacco Use   Smoking status: Every Day    Packs/day: 1.00    Types: Cigarettes   Smokeless tobacco: Never  Substance Use Topics   Alcohol use: Not Currently    Allergies  Allergen Reactions   Codeine Hives, Shortness Of Breath and Nausea Only   Toradol [Ketorolac Tromethamine] Hives, Shortness Of Breath and Nausea Only   Tramadol Hcl Shortness Of Breath and Nausea Only    "Conflicts with bipolar condition"    Current  Outpatient Medications  Medication Sig Dispense Refill   aspirin EC 81 MG EC tablet Take 1 tablet (81 mg total) by mouth daily. 30 tablet 0   aspirin EC 81 MG tablet Take 1 tablet (81 mg total) by mouth daily. 150 tablet 2   clopidogrel (PLAVIX) 75 MG tablet Take 1 tablet (75 mg total) by mouth daily. 30 tablet 6   gabapentin (NEURONTIN) 300 MG capsule Take 1 capsule (300 mg total) by mouth 2 (two) times daily. 60 capsule 0   Ibuprofen 200 MG CAPS      Insulin Pen Needle (NOVOFINE) 30G X 8 MM MISC Inject 10 each into the skin as needed. 100 each 0   LANTUS 100 UNIT/ML injection      Menthol, Topical Analgesic, (BIOFREEZE EX) Apply 1 application topically 2 (two) times daily as needed (pain).     rosuvastatin (CRESTOR) 10 MG tablet Take 1 tablet (10 mg total) by mouth daily. 30 tablet 5   albuterol (VENTOLIN HFA) 108 (90 Base) MCG/ACT inhaler Inhale 1-2 puffs into the lungs every 6 (six) hours as needed for wheezing or shortness of breath. (Patient not taking: Reported on 01/18/2022)     atorvastatin (LIPITOR) 20 MG tablet Take 1 tablet (20 mg total) by mouth daily at 6 PM. (Patient not taking: Reported on 01/18/2022) 30 tablet 0   metoprolol tartrate (LOPRESSOR) 25 MG  tablet Take 0.5 tablets (12.5 mg total) by mouth 2 (two) times daily. (Patient not taking: Reported on 01/18/2022) 60 tablet 0   oxyCODONE-acetaminophen (PERCOCET/ROXICET) 5-325 MG tablet Take 1 tablet by mouth every 8 (eight) hours as needed for severe pain. (Patient not taking: Reported on 07/05/2019) 15 tablet 0   No current facility-administered medications for this visit.      REVIEW OF SYSTEMS (Negative unless checked)  Constitutional: [] Weight loss  [] Fever  [] Chills Cardiac: [] Chest pain   [] Chest pressure   [] Palpitations   [] Shortness of breath when laying flat   [] Shortness of breath at rest   [x] Shortness of breath with exertion. 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   [] Dysphagia   [] Weakness or numbness in arms   [] Weakness or numbness in legs Musculoskeletal:  [] Arthritis   [] Joint swelling   [] Joint pain   [] Low back pain Hematologic:  [] Easy bruising  [] Easy bleeding   [] Hypercoagulable state   [] Anemic  [] Hepatitis Gastrointestinal:  [] Blood in stool   [] Vomiting blood  [] Gastroesophageal reflux/heartburn   [] Abdominal pain Genitourinary:  [] Chronic kidney disease   [] Difficult urination  [] Frequent urination  [] Burning with urination   [] Hematuria Skin:  [] Rashes   [] Ulcers   [] Wounds Psychological:  [] History of anxiety   []  History of major depression.    Physical Exam BP (!) 168/88 (BP Location: Left Arm)    Pulse 81    Resp 16    Ht 5\' 2"  (1.575 m)    Wt 171 lb (77.6 kg)    BMI 31.28 kg/m  Gen:  WD/WN, NAD Head: [place]/AT, No temporalis wasting.  Ear/Nose/Throat: Hearing grossly intact, nares w/o erythema or drainage, oropharynx w/o Erythema/Exudate Eyes: Conjunctiva clear, sclera non-icteric  Neck: trachea midline.  Bilateral bruits Pulmonary:  Good air movement, clear to auscultation bilaterally.  Cardiac: RRR, normal S1, S2, no Murmurs, rubs or gallops. Vascular:  Vessel Right Left  Radial Palpable Palpable                          PT Palpable Palpable  DP Palpable Palpable   Gastrointestinal: soft, non-tender/non-distended.  Musculoskeletal: M/S 5/5 throughout.  Extremities without ischemic changes.  No deformity or atrophy.  No edema. Neurologic: Sensation grossly intact in extremities.  Symmetrical.   Speech is fluent. Motor exam as listed above. Psychiatric: Judgment intact, Mood & affect appropriate for pt's clinical situation. Dermatologic: No rashes or ulcers noted.  No cellulitis or open wounds.    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 COMPARISON:  None. FINDINGS: Criteria: Quantification of carotid stenosis is based on velocity parameters that correlate the residual internal carotid diameter with  NASCET-based stenosis levels, using the diameter of the distal internal carotid lumen as the denominator for stenosis measurement. The following velocity measurements were obtained: RIGHT ICA:  Systolic 123XX123 cm/sec, Diastolic 43 cm/sec CCA:  75 cm/sec SYSTOLIC ICA/CCA RATIO:  2.5 ECA:  126 cm/sec LEFT ICA:  Systolic 0000000 cm/sec, Diastolic 57 cm/sec CCA:  29 cm/sec SYSTOLIC ICA/CCA RATIO:  8.9 ECA:  66 cm/sec Right Brachial SBP: Not acquired Left Brachial SBP: Not acquired RIGHT CAROTID ARTERY: Atherosclerotic changes of the right common carotid artery. Intermediate waveform maintained. Moderate heterogeneous and partially calcified plaque at the right carotid bifurcation. Lumen shadowing present low resistance waveform of the right ICA. No significant tortuosity. RIGHT VERTEBRAL ARTERY: Antegrade flow with low resistance waveform. LEFT CAROTID ARTERY: Atherosclerotic changes of the left common carotid artery. The velocity of the distal common carotid artery measured 29 centimeter/second, beyond some atherosclerotic changes of the mid CCA. Intermediate waveform maintained in the distal CCA. Moderate heterogeneous and partially calcified plaque at the left carotid bifurcation. Lumen shadowing. Parvus tardus waveform at the bifurcation and in the left  ICA. LEFT VERTEBRAL ARTERY:  Antegrade flow with low resistance waveform. IMPRESSION: Right: Heterogeneous and partially calcified plaque at the right carotid bifurcation contributes to 50%-69% stenosis by established duplex criteria. Note that the flow velocities of the right ICA were obtained from an area distal to the maximum narrowing due to the presence of anterior wall plaque with shadowing and may be underestimating the percentage of ICA stenosis. Left: Heterogeneous and partially calcified plaque at the left carotid bifurcation contributes to 70%-99% stenosis by established duplex criteria. Note that the left ICA waveform connotes a critical stenosis, on the higher end of this spectrum. Signed, Dulcy Fanny. Dellia Nims, RPVI [facility] Radiology Electronically Signed   By: Corrie Mckusick D.O.   On: 12/25/2021 12:53    Labs No results found for this or any previous visit (from the past 2160 hour(s)).  Assessment/Plan:  Carotid stenosis She had a carotid duplex which I have reviewed which suggest 50 to 69% right ICA stenosis and greater than 70% left ICA stenosis. At this point, the patient is having crescendo symptoms and trying to get her cleared for surgery may take weeks at our institution.  The most expeditious way to treat her as well as to improve her medical therapy appropriately is to start her on Plavix, aspirin, and statin agent today.  Can then proceed with carotid stenting next week.  If she has anatomy that is not favorable for stenting at that time, carotid endarterectomy will have to be considered afterwards.  High-grade lesion causing crescendo symptoms particularly in a young woman needs to be treated in the near future.  She should be on antiplatelet therapy for 5 days prior to the intervention though.  Risks and benefits were discussed with the patient and she is agreeable to proceed.  CAD (coronary artery disease) Continue cardiac and  antihypertensive medications as already ordered and reviewed, no changes at this time. Continue statin as ordered and reviewed, no changes at this time Nitrates PRN for chest pain   Essential hypertension blood pressure control important in reducing the progression of atherosclerotic disease. On appropriate oral medications.       Leotis Pain 01/18/2022, 12:12 PM   This note was created with Dragon medical transcription system.  Any errors from dictation are unintentional.

## 2022-01-18 NOTE — Assessment & Plan Note (Signed)
She had a carotid duplex which I have reviewed which suggest 50 to 69% right ICA stenosis and greater than 70% left ICA stenosis. ?At this point, the patient is having crescendo symptoms and trying to get her cleared for surgery may take weeks at our institution.  The most expeditious way to treat her as well as to improve her medical therapy appropriately is to start her on Plavix, aspirin, and statin agent today.  Can then proceed with carotid stenting next week.  If she has anatomy that is not favorable for stenting at that time, carotid endarterectomy will have to be considered afterwards.  High-grade lesion causing crescendo symptoms particularly in a young woman needs to be treated in the near future.  She should be on antiplatelet therapy for 5 days prior to the intervention though.  Risks and benefits were discussed with the patient and she is agreeable to proceed. ?

## 2022-01-18 NOTE — Assessment & Plan Note (Signed)
Continue cardiac and antihypertensive medications as already ordered and reviewed, no changes at this time. Continue statin as ordered and reviewed, no changes at this time Nitrates PRN for chest pain  

## 2022-01-18 NOTE — Telephone Encounter (Signed)
Pt LVM on my line requesting a sooner appt. States that the referral should be immediate. I do not see any notes for Urgent/Emergent on referral. After speaking with Dr. Wyn Quaker, he states that because she has had a CT, we can bring her in 1st available. I ATC # listed in chart [phone] and it is disconnected. ATC # in referral [phone] - VM full and unable to LM. If pt calls back, please make appt for 1st available. No studies.  ?

## 2022-01-18 NOTE — H&P (View-Only) (Signed)
? ? ?Patient ID: Veronica Murray, female   DOB: [birth year], 66 y.o.   MRN: [id number] ? ?Chief Complaint  ?Patient presents with  ? New Patient (Initial Visit)  ?  Consult for ultrasound  ? ? ?HPI ?Veronica Murray is a 66 y.o. female.  I am asked to see the patient by Dr. Rolley Sims for evaluation of carotid stenosis with multiple amaurosis fugax episodes over the past couple of weeks.  The patient has a previous history of heart disease with coronary intervention about 2 to 3 years ago.  These amaurosis episodes have been multiple.  The patient reports headaches associated with the events.  She denies any fevers or chills.  No arm or leg weakness or numbness.  No speech or swallowing difficulty.  She had a carotid duplex which I have reviewed which suggest 50 to 69% right ICA stenosis and greater than 70% left ICA stenosis.  As such, she is referred for further evaluation and treatment ? ? ?Past Medical History:  ?Diagnosis Date  ? Aortic arch atherosclerosis ([facility])   ? Diabetes mellitus without complication ([facility])   ? Essential hypertension   ? Tobacco abuse   ? ? ?Past Surgical History:  ?Procedure Laterality Date  ? LEFT HEART CATH AND CORONARY ANGIOGRAPHY Left 07/05/2019  ? Procedure: LEFT HEART CATH AND CORONARY ANGIOGRAPHY;  Surgeon: Iran Ouch, MD;  Location: [facility];  Service: Cardiovascular;  Laterality: Left;  ? ? ?Family History  ?Problem Relation Age of Onset  ? CAD Neg Hx   ?No bleeding disorders, clotting disorders, or aneurysms ? ? ?Social History  ? ?Tobacco Use  ? Smoking status: Every Day  ?  Packs/day: 1.00  ?  Types: Cigarettes  ? Smokeless tobacco: Never  ?Substance Use Topics  ? Alcohol use: Not Currently  ? ? ?Allergies  ?Allergen Reactions  ? Codeine Hives, Shortness Of Breath and Nausea Only  ? Toradol [Ketorolac Tromethamine] Hives, Shortness Of Breath and Nausea Only  ? Tramadol Hcl Shortness Of Breath and Nausea Only  ?  "Conflicts with bipolar condition"  ? ? ?Current  Outpatient Medications  ?Medication Sig Dispense Refill  ? aspirin EC 81 MG EC tablet Take 1 tablet (81 mg total) by mouth daily. 30 tablet 0  ? aspirin EC 81 MG tablet Take 1 tablet (81 mg total) by mouth daily. 150 tablet 2  ? clopidogrel (PLAVIX) 75 MG tablet Take 1 tablet (75 mg total) by mouth daily. 30 tablet 6  ? gabapentin (NEURONTIN) 300 MG capsule Take 1 capsule (300 mg total) by mouth 2 (two) times daily. 60 capsule 0  ? Ibuprofen 200 MG CAPS     ? Insulin Pen Needle (NOVOFINE) 30G X 8 MM MISC Inject 10 each into the skin as needed. 100 each 0  ? LANTUS 100 UNIT/ML injection     ? Menthol, Topical Analgesic, (BIOFREEZE EX) Apply 1 application topically 2 (two) times daily as needed (pain).    ? rosuvastatin (CRESTOR) 10 MG tablet Take 1 tablet (10 mg total) by mouth daily. 30 tablet 5  ? albuterol (VENTOLIN HFA) 108 (90 Base) MCG/ACT inhaler Inhale 1-2 puffs into the lungs every 6 (six) hours as needed for wheezing or shortness of breath. (Patient not taking: Reported on 01/18/2022)    ? atorvastatin (LIPITOR) 20 MG tablet Take 1 tablet (20 mg total) by mouth daily at 6 PM. (Patient not taking: Reported on 01/18/2022) 30 tablet 0  ? metoprolol tartrate (LOPRESSOR) 25 MG  tablet Take 0.5 tablets (12.5 mg total) by mouth 2 (two) times daily. (Patient not taking: Reported on 01/18/2022) 60 tablet 0  ? oxyCODONE-acetaminophen (PERCOCET/ROXICET) 5-325 MG tablet Take 1 tablet by mouth every 8 (eight) hours as needed for severe pain. (Patient not taking: Reported on 07/05/2019) 15 tablet 0  ? ?No current facility-administered medications for this visit.  ? ? ? ? ?REVIEW OF SYSTEMS (Negative unless checked) ? ?Constitutional: [] Weight loss  [] Fever  [] Chills ?Cardiac: [] Chest pain   [] Chest pressure   [] Palpitations   [] Shortness of breath when laying flat   [] Shortness of breath at rest   [x] Shortness of breath with exertion. 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   [] Dysphagia   [] Weakness or numbness in arms   [] Weakness or numbness in legs ?Musculoskeletal:  [] Arthritis   [] Joint swelling   [] Joint pain   [] Low back pain ?Hematologic:  [] Easy bruising  [] Easy bleeding   [] Hypercoagulable state   [] Anemic  [] Hepatitis ?Gastrointestinal:  [] Blood in stool   [] Vomiting blood  [] Gastroesophageal reflux/heartburn   [] Abdominal pain ?Genitourinary:  [] Chronic kidney disease   [] Difficult urination  [] Frequent urination  [] Burning with urination   [] Hematuria ?Skin:  [] Rashes   [] Ulcers   [] Wounds ?Psychological:  [] History of anxiety   []  History of major depression. ? ? ? ?Physical Exam ?BP (!) 168/88 (BP Location: Left Arm)   Pulse 81   Resp 16   Ht 5\' 2"  (1.575 m)   Wt 171 lb (77.6 kg)   BMI 31.28 kg/m?  ?Gen:  WD/WN, NAD ?Head: [place]/AT, No temporalis wasting.  ?Ear/Nose/Throat: Hearing grossly intact, nares w/o erythema or drainage, oropharynx w/o Erythema/Exudate ?Eyes: Conjunctiva clear, sclera non-icteric  ?Neck: trachea midline.  Bilateral bruits ?Pulmonary:  Good air movement, clear to auscultation bilaterally.  ?Cardiac: RRR, normal S1, S2, no Murmurs, rubs or gallops. ?Vascular:  ?Vessel Right Left  ?Radial Palpable Palpable  ?    ?    ?    ?    ?    ?    ?PT Palpable Palpable  ?DP Palpable Palpable  ? ?Gastrointestinal: soft, non-tender/non-distended.  ?Musculoskeletal: M/S 5/5 throughout.  Extremities without ischemic changes.  No deformity or atrophy.  No edema. ?Neurologic: Sensation grossly intact in extremities.  Symmetrical.   Speech is fluent. Motor exam as listed above. ?Psychiatric: Judgment intact, Mood & affect appropriate for pt's clinical situation. ?Dermatologic: No rashes or ulcers noted.  No cellulitis or open wounds. ? ? ? ?Radiology ?US Carotid Bilateral ? 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 COMPARISON:  None. FINDINGS: Criteria: Quantification of carotid stenosis is based on velocity parameters that correlate the residual internal carotid diameter with NASCET-based stenosis levels, using the  diameter of the distal internal carotid lumen as the denominator for stenosis measurement. The following velocity measurements were obtained: RIGHT ICA:  Systolic 187 cm/sec, Diastolic 43 cm/sec CCA:  75 cm/sec SYSTOLIC ICA/CCA RATIO:  2.5 ECA:  126 cm/sec LEFT ICA:  Systolic 254 cm/sec, Diastolic 57 cm/sec CCA:  29 cm/sec SYSTOLIC ICA/CCA RATIO:  8.9 ECA:  66 cm/sec Right Brachial SBP: Not acquired Left Brachial SBP: Not acquired RIGHT CAROTID ARTERY: Atherosclerotic changes of the right common carotid artery. Intermediate waveform maintained. Moderate heterogeneous and partially calcified plaque at the right carotid bifurcation. Lumen shadowing present low resistance waveform of the right ICA. No significant tortuosity. RIGHT VERTEBRAL ARTERY: Antegrade flow with low resistance waveform. LEFT CAROTID ARTERY: Atherosclerotic changes of the left common carotid artery. The velocity of the distal common carotid artery measured 29 centimeter/second, beyond some atherosclerotic changes of the mid CCA. Intermediate waveform maintained in the distal CCA. Moderate heterogeneous and partially calcified plaque at the left carotid bifurcation. Lumen shadowing. Parvus tardus waveform at the bifurcation and in the left  ICA. LEFT VERTEBRAL ARTERY:  Antegrade flow with low resistance waveform. IMPRESSION: Right: Heterogeneous and partially calcified plaque at the right carotid bifurcation contributes to 50%-69% stenosis by estab

## 2022-01-21 ENCOUNTER — Telehealth (INDEPENDENT_AMBULATORY_CARE_PROVIDER_SITE_OTHER): Payer: Self-pay

## 2022-01-21 NOTE — Telephone Encounter (Addendum)
I have attempted to contact the patient whose mobile phone is also the home number as well. The voicemail box is full, I am unable to leave a message. I then attempted to contact the spouse with the same results.  Patient returned my call and is scheduled with Dr. Wyn Quaker for a left carotid stent placement on 01/24/22 with a 8:15 am arrival time to the MM. Pre-procedure instructions were discussed and patient will pick them up at the office. ?

## 2022-01-22 ENCOUNTER — Other Ambulatory Visit: Payer: Self-pay

## 2022-01-22 ENCOUNTER — Other Ambulatory Visit
Admission: RE | Admit: 2022-01-22 | Discharge: 2022-01-22 | Disposition: A | Discharge status: Home / Self Care | Payer: Medicare HMO | Source: Ambulatory Visit | Attending: Vascular Surgery | Admitting: Vascular Surgery

## 2022-01-22 DIAGNOSIS — Z01812 Encounter for preprocedural laboratory examination: Secondary | ICD-10-CM | POA: Insufficient documentation

## 2022-01-22 DIAGNOSIS — Z20822 Contact with and (suspected) exposure to covid-19: Secondary | ICD-10-CM | POA: Insufficient documentation

## 2022-01-22 LAB — SARS CORONAVIRUS 2 (TAT 6-24 HRS): SARS Coronavirus 2: NEGATIVE

## 2022-01-24 ENCOUNTER — Other Ambulatory Visit: Payer: Self-pay

## 2022-01-24 ENCOUNTER — Encounter: Payer: Self-pay | Admitting: Certified Registered Nurse Anesthetist

## 2022-01-24 ENCOUNTER — Encounter: Payer: Self-pay | Admitting: Vascular Surgery

## 2022-01-24 ENCOUNTER — Encounter
Admission: RE | Disposition: A | Discharge status: Home / Self Care | Payer: Self-pay | Source: Home / Self Care | Attending: Vascular Surgery

## 2022-01-24 ENCOUNTER — Other Ambulatory Visit (INDEPENDENT_AMBULATORY_CARE_PROVIDER_SITE_OTHER): Payer: Self-pay | Admitting: Nurse Practitioner

## 2022-01-24 ENCOUNTER — Inpatient Hospital Stay
Admission: RE | Admit: 2022-01-24 | Discharge: 2022-01-26 | DRG: 039 | Disposition: A | Discharge status: Home / Self Care | Payer: Medicare HMO | Attending: Vascular Surgery | Admitting: Vascular Surgery

## 2022-01-24 DIAGNOSIS — Z885 Allergy status to narcotic agent status: Secondary | ICD-10-CM

## 2022-01-24 DIAGNOSIS — I6522 Occlusion and stenosis of left carotid artery: Secondary | ICD-10-CM | POA: Diagnosis present

## 2022-01-24 DIAGNOSIS — Z7902 Long term (current) use of antithrombotics/antiplatelets: Secondary | ICD-10-CM | POA: Diagnosis not present

## 2022-01-24 DIAGNOSIS — Z794 Long term (current) use of insulin: Secondary | ICD-10-CM | POA: Diagnosis not present

## 2022-01-24 DIAGNOSIS — F17210 Nicotine dependence, cigarettes, uncomplicated: Secondary | ICD-10-CM | POA: Diagnosis present

## 2022-01-24 DIAGNOSIS — Z20822 Contact with and (suspected) exposure to covid-19: Secondary | ICD-10-CM | POA: Diagnosis present

## 2022-01-24 DIAGNOSIS — H349 Unspecified retinal vascular occlusion: Secondary | ICD-10-CM | POA: Diagnosis not present

## 2022-01-24 DIAGNOSIS — I2510 Atherosclerotic heart disease of native coronary artery without angina pectoris: Secondary | ICD-10-CM | POA: Diagnosis present

## 2022-01-24 DIAGNOSIS — E119 Type 2 diabetes mellitus without complications: Secondary | ICD-10-CM | POA: Diagnosis present

## 2022-01-24 DIAGNOSIS — Z7982 Long term (current) use of aspirin: Secondary | ICD-10-CM | POA: Diagnosis not present

## 2022-01-24 DIAGNOSIS — I10 Essential (primary) hypertension: Secondary | ICD-10-CM | POA: Diagnosis present

## 2022-01-24 DIAGNOSIS — M797 Fibromyalgia: Secondary | ICD-10-CM | POA: Diagnosis present

## 2022-01-24 DIAGNOSIS — I700 Atherosclerosis of aorta: Secondary | ICD-10-CM | POA: Diagnosis present

## 2022-01-24 DIAGNOSIS — I6523 Occlusion and stenosis of bilateral carotid arteries: Secondary | ICD-10-CM

## 2022-01-24 DIAGNOSIS — F4323 Adjustment disorder with mixed anxiety and depressed mood: Secondary | ICD-10-CM | POA: Diagnosis present

## 2022-01-24 DIAGNOSIS — Z79899 Other long term (current) drug therapy: Secondary | ICD-10-CM | POA: Diagnosis not present

## 2022-01-24 HISTORY — DX: Fibromyalgia: M79.7

## 2022-01-24 HISTORY — PX: CAROTID PTA/STENT INTERVENTION: CATH118231

## 2022-01-24 LAB — GLUCOSE, CAPILLARY
Glucose-Capillary: 357 mg/dL — ABNORMAL HIGH (ref 70–99)
Glucose-Capillary: 346 mg/dL — ABNORMAL HIGH (ref 70–99)
Glucose-Capillary: 299 mg/dL — ABNORMAL HIGH (ref 70–99)
Glucose-Capillary: 177 mg/dL — ABNORMAL HIGH (ref 70–99)

## 2022-01-24 LAB — CREATININE, SERUM
Creatinine, Ser: 0.67 mg/dL (ref 0.44–1.00)
GFR, Estimated: >60 mL/min (ref >60)

## 2022-01-24 LAB — BUN: BUN: 20 mg/dL (ref 8–23)

## 2022-01-24 SURGERY — CAROTID PTA/STENT INTERVENTION
Anesthesia: Moderate Sedation | Laterality: Left

## 2022-01-24 MED ORDER — METOPROLOL TARTRATE 25 MG PO TABS
12.5000 mg | ORAL_TABLET | Freq: Two times a day (BID) | ORAL | Status: DC
Start: 2022-01-24 — End: 2022-01-24

## 2022-01-24 MED ORDER — INSULIN ASPART 100 UNIT/ML IJ SOLN: 0.0000 [IU] | Freq: Every day | INTRAMUSCULAR | Status: DC

## 2022-01-24 MED ORDER — FAMOTIDINE 20 MG PO TABS: 40.0000 mg | ORAL_TABLET | Freq: Once | ORAL | Status: DC | PRN

## 2022-01-24 MED ORDER — ROSUVASTATIN CALCIUM 10 MG PO TABS: 10.0000 mg | ORAL_TABLET | Freq: Every evening | ORAL | Status: DC

## 2022-01-24 MED ORDER — ONDANSETRON HCL 4 MG/2ML IJ SOLN
4.0000 mg | Freq: Four times a day (QID) | INTRAMUSCULAR | Status: DC | PRN
  Administered 2022-01-24: 4 mg via INTRAVENOUS
  Filled 2022-01-24: qty 2

## 2022-01-24 MED ORDER — SODIUM CHLORIDE 0.9 % IV SOLN: INTRAVENOUS | Status: DC

## 2022-01-24 MED ORDER — DIPHENHYDRAMINE HCL 50 MG/ML IJ SOLN
INTRAMUSCULAR | Status: AC
  Filled 2022-01-24: qty 1

## 2022-01-24 MED ORDER — HEPARIN SODIUM (PORCINE) 1000 UNIT/ML IJ SOLN
INTRAMUSCULAR | Status: AC
  Filled 2022-01-24: qty 10

## 2022-01-24 MED ORDER — METHYLPREDNISOLONE SODIUM SUCC 125 MG IJ SOLR: 125.0000 mg | Freq: Once | INTRAMUSCULAR | Status: DC | PRN

## 2022-01-24 MED ORDER — GABAPENTIN 300 MG PO CAPS
300.0000 mg | ORAL_CAPSULE | Freq: Two times a day (BID) | ORAL | Status: DC
  Administered 2022-01-25 – 2022-01-26 (×2): 300 mg via ORAL
  Filled 2022-01-24 (×2): qty 1

## 2022-01-24 MED ORDER — METOPROLOL TARTRATE 5 MG/5ML IV SOLN: 2.0000 mg | INTRAVENOUS | Status: DC | PRN

## 2022-01-24 MED ORDER — HYDRALAZINE HCL 20 MG/ML IJ SOLN: 5.0000 mg | INTRAMUSCULAR | Status: DC | PRN

## 2022-01-24 MED ORDER — HEPARIN SODIUM (PORCINE) 1000 UNIT/ML IJ SOLN
INTRAMUSCULAR | Status: DC | PRN
  Administered 2022-01-24: 6000 [IU] via INTRAVENOUS

## 2022-01-24 MED ORDER — IODIXANOL 320 MG/ML IV SOLN
INTRAVENOUS | Status: DC | PRN
  Administered 2022-01-24: 35 mL

## 2022-01-24 MED ORDER — ASPIRIN EC 81 MG PO TBEC: 81.0000 mg | DELAYED_RELEASE_TABLET | Freq: Every day | ORAL | Status: DC

## 2022-01-24 MED ORDER — INSULIN ASPART 100 UNIT/ML IJ SOLN
0.0000 [IU] | Freq: Three times a day (TID) | INTRAMUSCULAR | Status: DC
  Administered 2022-01-24 – 2022-01-25 (×2): 8 [IU] via SUBCUTANEOUS
  Filled 2022-01-24 (×2): qty 1

## 2022-01-24 MED ORDER — MIDAZOLAM HCL 2 MG/2ML IJ SOLN
INTRAMUSCULAR | Status: DC | PRN
  Administered 2022-01-24 (×2): 1 mg via INTRAVENOUS

## 2022-01-24 MED ORDER — POTASSIUM CHLORIDE CRYS ER 20 MEQ PO TBCR: 20.0000 meq | EXTENDED_RELEASE_TABLET | Freq: Once | ORAL | Status: DC

## 2022-01-24 MED ORDER — ALUM & MAG HYDROXIDE-SIMETH 200-200-20 MG/5ML PO SUSP: 15.0000 mL | ORAL | Status: DC | PRN

## 2022-01-24 MED ORDER — HEPARIN (PORCINE) 25000 UT/250ML-% IV SOLN
600.0000 [IU]/h | INTRAVENOUS | Status: DC
Start: 2022-01-24 — End: 2022-01-24

## 2022-01-24 MED ORDER — MUSCLE RUB 10-15 % EX CREA
TOPICAL_CREAM | Freq: Two times a day (BID) | CUTANEOUS | Status: DC | PRN
  Filled 2022-01-24: qty 85

## 2022-01-24 MED ORDER — FENTANYL CITRATE PF 50 MCG/ML IJ SOSY
PREFILLED_SYRINGE | INTRAMUSCULAR | Status: AC
  Filled 2022-01-24: qty 1

## 2022-01-24 MED ORDER — GUAIFENESIN-DM 100-10 MG/5ML PO SYRP
15.0000 mL | ORAL_SOLUTION | ORAL | Status: DC | PRN
  Filled 2022-01-24: qty 15

## 2022-01-24 MED ORDER — APIXABAN 5 MG PO TABS: 5.0000 mg | ORAL_TABLET | Freq: Two times a day (BID) | ORAL | 2 refills | Status: DC

## 2022-01-24 MED ORDER — PANTOPRAZOLE SODIUM 40 MG PO TBEC: 40.0000 mg | DELAYED_RELEASE_TABLET | Freq: Every day | ORAL | Status: DC

## 2022-01-24 MED ORDER — DIPHENHYDRAMINE HCL 50 MG/ML IJ SOLN
50.0000 mg | Freq: Once | INTRAMUSCULAR | Status: DC | PRN
Start: 2022-01-24 — End: 2022-01-24

## 2022-01-24 MED ORDER — MIDAZOLAM HCL 5 MG/5ML IJ SOLN
INTRAMUSCULAR | Status: AC
  Filled 2022-01-24: qty 5

## 2022-01-24 MED ORDER — ATROPINE SULFATE 1 MG/10ML IJ SOSY
PREFILLED_SYRINGE | INTRAMUSCULAR | Status: AC
Start: 2022-01-24 — End: 2022-01-24
  Filled 2022-01-24: qty 10

## 2022-01-24 MED ORDER — SODIUM CHLORIDE 0.9 % IV BOLUS
250.0000 mL | Freq: Once | INTRAVENOUS | Status: AC
  Administered 2022-01-24: 250 mL via INTRAVENOUS

## 2022-01-24 MED ORDER — MIDAZOLAM HCL 2 MG/ML PO SYRP: 8.0000 mg | ORAL_SOLUTION | Freq: Once | ORAL | Status: DC | PRN

## 2022-01-24 MED ORDER — CEFAZOLIN SODIUM-DEXTROSE 2-4 GM/100ML-% IV SOLN: 2.0000 g | INTRAVENOUS | Status: DC

## 2022-01-24 MED ORDER — OXYCODONE-ACETAMINOPHEN 5-325 MG PO TABS: 1.0000 | ORAL_TABLET | Freq: Three times a day (TID) | ORAL | Status: DC | PRN

## 2022-01-24 MED ORDER — DIPHENHYDRAMINE HCL 50 MG/ML IJ SOLN
INTRAMUSCULAR | Status: DC | PRN
  Administered 2022-01-24: 50 mg via INTRAVENOUS

## 2022-01-24 MED ORDER — ONDANSETRON HCL 4 MG/2ML IJ SOLN: 4.0000 mg | Freq: Four times a day (QID) | INTRAMUSCULAR | Status: DC | PRN

## 2022-01-24 MED ORDER — CEFAZOLIN SODIUM-DEXTROSE 2-4 GM/100ML-% IV SOLN: 2.0000 g | Freq: Once | INTRAVENOUS | Status: AC

## 2022-01-24 MED ORDER — PHENOL 1.4 % MT LIQD
1.0000 | OROMUCOSAL | Status: DC | PRN
  Filled 2022-01-24: qty 177

## 2022-01-24 MED ORDER — HYDROMORPHONE HCL 1 MG/ML IJ SOLN
1.0000 mg | Freq: Once | INTRAMUSCULAR | Status: AC | PRN
  Administered 2022-01-24: 1 mg via INTRAVENOUS
  Filled 2022-01-24 (×2): qty 1

## 2022-01-24 MED ORDER — GABAPENTIN 300 MG PO CAPS
300.0000 mg | ORAL_CAPSULE | Freq: Three times a day (TID) | ORAL | Status: DC
  Administered 2022-01-24: 300 mg via ORAL
  Filled 2022-01-24: qty 1

## 2022-01-24 MED ORDER — INSULIN GLARGINE-YFGN 100 UNIT/ML ~~LOC~~ SOLN
20.0000 [IU] | Freq: Every day | SUBCUTANEOUS | Status: DC
  Administered 2022-01-24: 20 [IU] via SUBCUTANEOUS
  Filled 2022-01-24 (×2): qty 0.2

## 2022-01-24 MED ORDER — FENTANYL CITRATE (PF) 100 MCG/2ML IJ SOLN
INTRAMUSCULAR | Status: DC | PRN
  Administered 2022-01-24: 50 ug via INTRAVENOUS
  Administered 2022-01-24 (×2): 25 ug via INTRAVENOUS

## 2022-01-24 MED ORDER — CEFAZOLIN SODIUM-DEXTROSE 2-4 GM/100ML-% IV SOLN
INTRAVENOUS | Status: AC
  Administered 2022-01-24: 2 g via INTRAVENOUS
  Filled 2022-01-24: qty 100

## 2022-01-24 MED ORDER — LABETALOL HCL 5 MG/ML IV SOLN: 10.0000 mg | INTRAVENOUS | Status: DC | PRN

## 2022-01-24 MED ORDER — PHENYLEPHRINE HCL-NACL 20-0.9 MG/250ML-% IV SOLN
INTRAVENOUS | Status: AC
  Filled 2022-01-24: qty 250

## 2022-01-24 MED ORDER — PHENYLEPHRINE 40 MCG/ML (10ML) SYRINGE FOR IV PUSH (FOR BLOOD PRESSURE SUPPORT)
PREFILLED_SYRINGE | INTRAVENOUS | Status: AC
  Filled 2022-01-24: qty 10

## 2022-01-24 SURGICAL SUPPLY — 16 items
CATH ANGIO 5F PIGTAIL 100CM (CATHETERS) ×1 IMPLANT
CATH BEACON 5 .035 100 JB2 TIP (CATHETERS) ×1 IMPLANT
COVER DRAPE FLUORO 36X44 (DRAPES) ×1 IMPLANT
COVER PROBE U/S 5X48 (MISCELLANEOUS) ×1 IMPLANT
DEVICE STARCLOSE SE CLOSURE (Vascular Products) ×1 IMPLANT
DEVICE TORQUE .025-.038 (MISCELLANEOUS) ×1 IMPLANT
GLIDEWIRE ANGLED SS 035X260CM (WIRE) ×1 IMPLANT
GLIDEWIRE STIFF .35X180X3 HYDR (WIRE) ×1 IMPLANT
GUIDEWIRE VASC STIFF .038X260 (WIRE) ×1 IMPLANT
KIT CAROTID MANIFOLD (MISCELLANEOUS) ×1 IMPLANT
KIT ENCORE 26 ADVANTAGE (KITS) ×1 IMPLANT
PACK ANGIOGRAPHY (CUSTOM PROCEDURE TRAY) ×2 IMPLANT
SHEATH BRITE TIP 5FRX11 (SHEATH) ×1 IMPLANT
SYR MEDRAD MARK 7 150ML (SYRINGE) ×1 IMPLANT
TUBING CONTRAST HIGH PRESS 72 (TUBING) ×1 IMPLANT
WIRE GUIDERIGHT .035X150 (WIRE) ×1 IMPLANT

## 2022-01-24 NOTE — Progress Notes (Signed)
Inpatient Diabetes Program Recommendations ? ?AACE/ADA: New Consensus Statement on Inpatient Glycemic Control (2015) ? ?Target Ranges:  Prepandial:   less than 140 mg/dL ?     Peak postprandial:   less than 180 mg/dL (1-2 hours) ?     Critically ill patients:  140 - 180 mg/dL  ? ?Lab Results  ?Component Value Date  ? GLUCAP 346 (H) 01/24/2022  ? HGBA1C 11.4 (H) 06/25/2019  ? ? ?Review of Glycemic Control ? Latest Reference Range & Units 01/24/22 10:01 01/24/22 11:24  ?Glucose-Capillary 70 - 99 mg/dL 262 (H) 035 (H)  ? ?Diabetes history: DM 2 ?Outpatient Diabetes medications:  ?Lantus pen (per patient she takes 40 units) ?Current orders for Inpatient glycemic control:  ?None ? ?Inpatient Diabetes Program Recommendations:   ? ?Please add Novolog moderate q 4 hours and Semglee 20 units daily while in the hospital.  Note patient to be admitted for surgery tomorrow.   ?Thanks,  ? ?Beryl Meager, RN, BC-ADM ?Inpatient Diabetes Coordinator ?Pager [phone]  (8a-5p) ? ? ?

## 2022-01-24 NOTE — Interval H&P Note (Signed)
History and Physical Interval Note: ? ?01/24/2022 ?9:04 AM ? ?Veronica Murray  has presented today for surgery, with the diagnosis of Left Carotid Stent   [facility] Rep     Carotid artery stenosis.  The various methods of treatment have been discussed with the patient and family. After consideration of risks, benefits and other options for treatment, the patient has consented to  Procedure(s): ?CAROTID PTA/STENT INTERVENTION (Left) as a surgical intervention.  The patient's history has been reviewed, patient examined, no change in status, stable for surgery.  I have reviewed the patient's chart and labs.  Questions were answered to the patient's satisfaction.   ? ? ?Veronica Murray ? ? ?

## 2022-01-24 NOTE — Op Note (Signed)
[facility] ? ? ?OPERATIVE NOTE ? ?DATE: 01/24/2022 ? ?PRE-OPERATIVE DIAGNOSIS: 1. Left carotid artery stenosis ? ? ?POST-OPERATIVE DIAGNOSIS: Same as above with subtotal occlusion not appropriate for carotid stenting ? ?PROCEDURE: ?1.   Ultrasound Guidance for vascular access right femoral artery ?2.   Catheter placement into left common carotid artery from right femoral approach ?3.   Thoracic aortogram ?4.   Cervical and cerebral bilateral carotid angiograms ?5.   StarClose closure device right femoral artery ? ?SURGEON: Leotis Pain, MD ? ?ASSISTANT(S): None ? ?ANESTHESIA: Moderate conscious sedation ? ?ESTIMATED BLOOD LOSS: 5 cc ? ?FLUORO TIME: 2.5 minutes ? ?CONTRAST: 35 ml ? ?MODERATE CONSCIOUS SEDATION TIME: Approximately 34 minutes using 2 mg of Versed and 100 mcg of Fentanyl ? ?FINDING(S): ?1.  Left cervical carotid artery densely calcified with a subtotal occlusion with minimal forward flow not appropriate for carotid stenting.  Intracranial flow was extremely sluggish with minimal middle cerebral artery flow seen.  No anterior cerebral artery was identified. ? ?SPECIMEN(S):  None ? ?INDICATIONS:   ?Patient is a 66 y.o.female who presents with visual symptoms and a high-grade left carotid artery stenosis by duplex. Catheter-based angiogram is performed for further evaluation. Risks and benefits are discussed and informed consent was obtained. ? ?DESCRIPTION: ?After obtaining full informed written consent, the patient was brought back to the operating room and placed supine upon the vascular suite table.  After obtaining adequate anesthesia, the patient was prepped and draped in the standard fashion.  Moderate conscious sedation was administered during a face to face encounter with the patient throughout the procedure with my supervision of the RN administering medicines and monitoring the patients vital signs and mental status throughout from the start of the procedure until the patient  was taken to the recovery room. The right femoral artery was visualized with ultrasound and found to be calcific but patent. It was then accessed under direct ultrasound guidance without difficulty with a Seldinger needle. A J-wire and 5 French sheath were placed and a permanent image was recorded. The patient was given 3000 units of intravenous heparin. A pigtail catheter was placed into the ascending aorta and an LAO projection thoracic aortogram was performed. This showed a type III aortic arch with very sluggish flow in the left common carotid artery but no proximal stenosis in the great vessels.  I then selected a JB2 catheter. I selectively cannulated the left common carotid artery without difficulty with a JB2 catheter and advanced into the mid left common carotid artery. Selective imaging was then performed of the cervical and cerebral carotid artery on the left. Intracranial filling was extremely sluggish with only a small amount of middle cerebral flow seen and no anterior cerebral flow. The cervical carotid artery was densely calcified with a subtotal occlusion and minimal forward flow not appropriate for endovascular therapy. Multiple views were taken in the cervical carotid artery. At this point, we had imaging to plan our treatment and we elected to terminate the procedure.  The patient should have a surgical exploration with possible endarterectomy versus ligation if it is totally occluded at the time of surgery to avoid continued embolization.  The diagnostic catheter was removed. Oblique arteriogram was performed of the right femoral artery and StarClose closure device was deployed in usual fashion with excellent hemostatic result. The patient tolerated the procedure well and was taken to the recovery room in stable condition. ? ?COMPLICATIONS: None ? ?CONDITION: Stable ? ? ?Leotis Pain ?01/24/2022 ?11:09 AM ? ? ?  This note was created with Dragon Medical transcription system. Any errors in dictation  are purely unintentional.  ?

## 2022-01-25 ENCOUNTER — Inpatient Hospital Stay: Payer: Medicare HMO | Admitting: Anesthesiology

## 2022-01-25 ENCOUNTER — Encounter: Payer: Self-pay | Admitting: Vascular Surgery

## 2022-01-25 ENCOUNTER — Encounter
Admission: RE | Disposition: A | Discharge status: Home / Self Care | Payer: Self-pay | Source: Home / Self Care | Attending: Vascular Surgery

## 2022-01-25 DIAGNOSIS — F4323 Adjustment disorder with mixed anxiety and depressed mood: Secondary | ICD-10-CM

## 2022-01-25 DIAGNOSIS — H349 Unspecified retinal vascular occlusion: Secondary | ICD-10-CM

## 2022-01-25 HISTORY — PX: ENDARTERECTOMY: SHX5162

## 2022-01-25 LAB — CBC WITH DIFFERENTIAL/PLATELET
Abs Immature Granulocytes: 0.02 10*3/uL (ref 0.00–0.07)
Basophils Absolute: 0 10*3/uL (ref 0.0–0.1)
Basophils Relative: 1 %
Eosinophils Absolute: 0.1 10*3/uL (ref 0.0–0.5)
Eosinophils Relative: 2 %
HCT: 36.6 % (ref 36.0–46.0)
Hemoglobin: 12.7 g/dL (ref 12.0–15.0)
Immature Granulocytes: 0 %
Lymphocytes Relative: 47 %
Lymphs Abs: 3.2 10*3/uL (ref 0.7–4.0)
MCH: 30.1 pg (ref 26.0–34.0)
MCHC: 34.7 g/dL (ref 30.0–36.0)
MCV: 86.7 fL (ref 80.0–100.0)
Monocytes Absolute: 0.4 10*3/uL (ref 0.1–1.0)
Monocytes Relative: 6 %
Neutro Abs: 2.9 10*3/uL (ref 1.7–7.7)
Neutrophils Relative %: 44 %
Platelets: 320 10*3/uL (ref 150–400)
RBC: 4.22 MIL/uL (ref 3.87–5.11)
RDW: 12.2 % (ref 11.5–15.5)
WBC: 6.6 10*3/uL (ref 4.0–10.5)
nRBC: 0 % (ref 0.0–0.2)

## 2022-01-25 LAB — BASIC METABOLIC PANEL
Anion gap: 9 (ref 5–15)
BUN: 19 mg/dL (ref 8–23)
CO2: 23 mmol/L (ref 22–32)
Calcium: 8.9 mg/dL (ref 8.9–10.3)
Chloride: 100 mmol/L (ref 98–111)
Creatinine, Ser: 0.58 mg/dL (ref 0.44–1.00)
GFR, Estimated: >60 mL/min (ref >60)
Glucose, Bld: 255 mg/dL — ABNORMAL HIGH (ref 70–99)
Potassium: 4.3 mmol/L (ref 3.5–5.1)
Sodium: 132 mmol/L — ABNORMAL LOW (ref 135–145)

## 2022-01-25 LAB — GLUCOSE, CAPILLARY
Glucose-Capillary: 255 mg/dL — ABNORMAL HIGH (ref 70–99)
Glucose-Capillary: 196 mg/dL — ABNORMAL HIGH (ref 70–99)
Glucose-Capillary: 289 mg/dL — ABNORMAL HIGH (ref 70–99)
Glucose-Capillary: 288 mg/dL — ABNORMAL HIGH (ref 70–99)
Glucose-Capillary: 291 mg/dL — ABNORMAL HIGH (ref 70–99)
Glucose-Capillary: 368 mg/dL — ABNORMAL HIGH (ref 70–99)
Glucose-Capillary: 262 mg/dL — ABNORMAL HIGH (ref 70–99)
Glucose-Capillary: 239 mg/dL — ABNORMAL HIGH (ref 70–99)
Glucose-Capillary: 207 mg/dL — ABNORMAL HIGH (ref 70–99)
Glucose-Capillary: 196 mg/dL — ABNORMAL HIGH (ref 70–99)
Glucose-Capillary: 166 mg/dL — ABNORMAL HIGH (ref 70–99)
Glucose-Capillary: 146 mg/dL — ABNORMAL HIGH (ref 70–99)

## 2022-01-25 LAB — HEMOGLOBIN A1C
Hgb A1c MFr Bld: 12.8 % — ABNORMAL HIGH (ref 4.8–5.6)
Mean Plasma Glucose: 321 mg/dL

## 2022-01-25 LAB — TYPE AND SCREEN
ABO/RH(D): O POS
Antibody Screen: NEGATIVE

## 2022-01-25 LAB — MRSA NEXT GEN BY PCR, NASAL: MRSA by PCR Next Gen: NOT DETECTED

## 2022-01-25 SURGERY — ENDARTERECTOMY, CAROTID
Anesthesia: General | Laterality: Left

## 2022-01-25 MED ORDER — CEFAZOLIN SODIUM-DEXTROSE 2-4 GM/100ML-% IV SOLN
INTRAVENOUS | Status: AC
  Administered 2022-01-25: 2 g via INTRAVENOUS
  Filled 2022-01-25: qty 100

## 2022-01-25 MED ORDER — HYDRALAZINE HCL 20 MG/ML IJ SOLN: 5.0000 mg | INTRAMUSCULAR | Status: DC | PRN

## 2022-01-25 MED ORDER — INSULIN ASPART 100 UNIT/ML IJ SOLN: 5.0000 [IU] | Freq: Once | INTRAMUSCULAR | Status: DC

## 2022-01-25 MED ORDER — PHENOL 1.4 % MT LIQD
1.0000 | OROMUCOSAL | Status: DC | PRN
  Filled 2022-01-25: qty 177

## 2022-01-25 MED ORDER — ACETAMINOPHEN 10 MG/ML IV SOLN
INTRAVENOUS | Status: DC | PRN
  Administered 2022-01-25: 1000 mg via INTRAVENOUS

## 2022-01-25 MED ORDER — MIDAZOLAM HCL 2 MG/2ML IJ SOLN
INTRAMUSCULAR | Status: AC
  Filled 2022-01-25: qty 2

## 2022-01-25 MED ORDER — NITROGLYCERIN IN D5W 200-5 MCG/ML-% IV SOLN: 5.0000 ug/min | INTRAVENOUS | Status: DC

## 2022-01-25 MED ORDER — CHLORHEXIDINE GLUCONATE CLOTH 2 % EX PADS
6.0000 | MEDICATED_PAD | Freq: Every day | CUTANEOUS | Status: DC
  Administered 2022-01-25: 6 via TOPICAL

## 2022-01-25 MED ORDER — SODIUM CHLORIDE 0.9 % IV SOLN: 500.0000 mL | Freq: Once | INTRAVENOUS | Status: DC | PRN

## 2022-01-25 MED ORDER — ACETAMINOPHEN 650 MG RE SUPP: 325.0000 mg | RECTAL | Status: DC | PRN

## 2022-01-25 MED ORDER — SUGAMMADEX SODIUM 200 MG/2ML IV SOLN
INTRAVENOUS | Status: DC | PRN
Start: 2022-01-25 — End: 2022-01-25
  Administered 2022-01-25: 200 mg via INTRAVENOUS

## 2022-01-25 MED ORDER — INSULIN ASPART 100 UNIT/ML IJ SOLN
INTRAMUSCULAR | Status: AC
  Filled 2022-01-25: qty 1

## 2022-01-25 MED ORDER — ALUM & MAG HYDROXIDE-SIMETH 200-200-20 MG/5ML PO SUSP: 15.0000 mL | ORAL | Status: DC | PRN

## 2022-01-25 MED ORDER — SODIUM CHLORIDE 0.9 % IV SOLN: INTRAVENOUS | Status: DC

## 2022-01-25 MED ORDER — POTASSIUM CHLORIDE CRYS ER 20 MEQ PO TBCR: 20.0000 meq | EXTENDED_RELEASE_TABLET | Freq: Every day | ORAL | Status: DC | PRN

## 2022-01-25 MED ORDER — DEXAMETHASONE SODIUM PHOSPHATE 10 MG/ML IJ SOLN
INTRAMUSCULAR | Status: DC | PRN
  Administered 2022-01-25: 10 mg via INTRAVENOUS

## 2022-01-25 MED ORDER — SODIUM CHLORIDE 0.9 % IV SOLN: INTRAVENOUS | Status: DC | PRN

## 2022-01-25 MED ORDER — FENTANYL CITRATE (PF) 100 MCG/2ML IJ SOLN
INTRAMUSCULAR | Status: AC
  Filled 2022-01-25: qty 2

## 2022-01-25 MED ORDER — OXYCODONE-ACETAMINOPHEN 5-325 MG PO TABS
1.0000 | ORAL_TABLET | ORAL | Status: DC | PRN
  Administered 2022-01-25 – 2022-01-26 (×4): 2 via ORAL
  Filled 2022-01-25 (×4): qty 2

## 2022-01-25 MED ORDER — ONDANSETRON HCL 4 MG/2ML IJ SOLN
INTRAMUSCULAR | Status: DC | PRN
  Administered 2022-01-25: 4 mg via INTRAVENOUS

## 2022-01-25 MED ORDER — HEPARIN SODIUM (PORCINE) 1000 UNIT/ML IJ SOLN
INTRAMUSCULAR | Status: DC | PRN
  Administered 2022-01-25: 5000 [IU] via INTRAVENOUS

## 2022-01-25 MED ORDER — PROPOFOL 10 MG/ML IV BOLUS
INTRAVENOUS | Status: AC
  Filled 2022-01-25: qty 20

## 2022-01-25 MED ORDER — FAMOTIDINE IN NACL 20-0.9 MG/50ML-% IV SOLN
20.0000 mg | Freq: Two times a day (BID) | INTRAVENOUS | Status: DC
  Administered 2022-01-25 – 2022-01-26 (×3): 20 mg via INTRAVENOUS
  Filled 2022-01-25 (×3): qty 50

## 2022-01-25 MED ORDER — DEXTROSE 50 % IV SOLN: 0.0000 mL | INTRAVENOUS | Status: DC | PRN

## 2022-01-25 MED ORDER — LABETALOL HCL 5 MG/ML IV SOLN
10.0000 mg | INTRAVENOUS | Status: DC | PRN
  Administered 2022-01-25 – 2022-01-26 (×2): 10 mg via INTRAVENOUS
  Filled 2022-01-25 (×2): qty 4

## 2022-01-25 MED ORDER — INSULIN REGULAR(HUMAN) IN NACL 100-0.9 UT/100ML-% IV SOLN
INTRAVENOUS | Status: DC
  Administered 2022-01-25 (×2): 11.5 [IU]/h via INTRAVENOUS
  Administered 2022-01-25: 16 [IU]/h via INTRAVENOUS
  Administered 2022-01-26: 4.8 [IU]/h via INTRAVENOUS
  Filled 2022-01-25 (×2): qty 100

## 2022-01-25 MED ORDER — ONDANSETRON HCL 4 MG/2ML IJ SOLN
INTRAMUSCULAR | Status: AC
  Filled 2022-01-25: qty 2

## 2022-01-25 MED ORDER — MORPHINE SULFATE (PF) 2 MG/ML IV SOLN
2.0000 mg | INTRAVENOUS | Status: DC | PRN
  Administered 2022-01-25 (×2): 2 mg via INTRAVENOUS
  Administered 2022-01-26 (×2): 4 mg via INTRAVENOUS
  Filled 2022-01-25: qty 2
  Filled 2022-01-25: qty 1
  Filled 2022-01-25: qty 2
  Filled 2022-01-25: qty 1

## 2022-01-25 MED ORDER — LACTATED RINGERS IV SOLN: INTRAVENOUS | Status: DC

## 2022-01-25 MED ORDER — PHENYLEPHRINE HCL-NACL 20-0.9 MG/250ML-% IV SOLN
INTRAVENOUS | Status: DC | PRN
  Administered 2022-01-25: 50 ug/min via INTRAVENOUS

## 2022-01-25 MED ORDER — ROCURONIUM BROMIDE 10 MG/ML (PF) SYRINGE
PREFILLED_SYRINGE | INTRAVENOUS | Status: AC
  Filled 2022-01-25: qty 10

## 2022-01-25 MED ORDER — DEXAMETHASONE SODIUM PHOSPHATE 10 MG/ML IJ SOLN
INTRAMUSCULAR | Status: AC
Start: 2022-01-25 — End: ?
  Filled 2022-01-25: qty 1

## 2022-01-25 MED ORDER — SODIUM CHLORIDE 0.9 % IV SOLN
INTRAVENOUS | Status: DC | PRN
  Administered 2022-01-25: 50 mL via INTRAMUSCULAR

## 2022-01-25 MED ORDER — EPHEDRINE 5 MG/ML INJ
INTRAVENOUS | Status: AC
  Filled 2022-01-25: qty 5

## 2022-01-25 MED ORDER — PROPOFOL 10 MG/ML IV BOLUS
INTRAVENOUS | Status: DC | PRN
  Administered 2022-01-25: 140 mg via INTRAVENOUS

## 2022-01-25 MED ORDER — ROCURONIUM BROMIDE 100 MG/10ML IV SOLN
INTRAVENOUS | Status: DC | PRN
  Administered 2022-01-25: 50 mg via INTRAVENOUS
  Administered 2022-01-25: 20 mg via INTRAVENOUS

## 2022-01-25 MED ORDER — ONDANSETRON HCL 4 MG/2ML IJ SOLN: 4.0000 mg | Freq: Once | INTRAMUSCULAR | Status: DC | PRN

## 2022-01-25 MED ORDER — FENTANYL CITRATE (PF) 100 MCG/2ML IJ SOLN
25.0000 ug | INTRAMUSCULAR | Status: DC | PRN
  Administered 2022-01-25: 50 ug via INTRAVENOUS

## 2022-01-25 MED ORDER — MIDAZOLAM HCL 2 MG/2ML IJ SOLN
INTRAMUSCULAR | Status: DC | PRN
Start: 2022-01-25 — End: 2022-01-25
  Administered 2022-01-25: 2 mg via INTRAVENOUS

## 2022-01-25 MED ORDER — DOCUSATE SODIUM 100 MG PO CAPS
100.0000 mg | ORAL_CAPSULE | Freq: Every day | ORAL | Status: DC
  Administered 2022-01-26: 100 mg via ORAL
  Filled 2022-01-25: qty 1

## 2022-01-25 MED ORDER — 0.9 % SODIUM CHLORIDE (POUR BTL) OPTIME
TOPICAL | Status: DC | PRN
  Administered 2022-01-25: 500 mL

## 2022-01-25 MED ORDER — ACETAMINOPHEN 10 MG/ML IV SOLN: 1000.0000 mg | Freq: Once | INTRAVENOUS | Status: DC | PRN

## 2022-01-25 MED ORDER — METOPROLOL TARTRATE 5 MG/5ML IV SOLN: 2.0000 mg | INTRAVENOUS | Status: DC | PRN

## 2022-01-25 MED ORDER — EPHEDRINE SULFATE (PRESSORS) 50 MG/ML IJ SOLN
INTRAMUSCULAR | Status: DC | PRN
  Administered 2022-01-25: 5 mg via INTRAVENOUS

## 2022-01-25 MED ORDER — FENTANYL CITRATE (PF) 100 MCG/2ML IJ SOLN
INTRAMUSCULAR | Status: DC | PRN
  Administered 2022-01-25 (×2): 50 ug via INTRAVENOUS

## 2022-01-25 MED ORDER — OXYCODONE HCL 5 MG PO TABS: 5.0000 mg | ORAL_TABLET | Freq: Once | ORAL | Status: DC | PRN

## 2022-01-25 MED ORDER — "VISTASEAL 4 ML SINGLE DOSE KIT "
PACK | CUTANEOUS | Status: DC | PRN
  Administered 2022-01-25: 4 mL via TOPICAL

## 2022-01-25 MED ORDER — ACETAMINOPHEN 10 MG/ML IV SOLN
INTRAVENOUS | Status: AC
Start: 2022-01-25 — End: ?
  Filled 2022-01-25: qty 100

## 2022-01-25 MED ORDER — OXYCODONE HCL 5 MG/5ML PO SOLN: 5.0000 mg | Freq: Once | ORAL | Status: DC | PRN

## 2022-01-25 MED ORDER — HEPARIN SODIUM (PORCINE) 1000 UNIT/ML IJ SOLN
INTRAMUSCULAR | Status: AC
  Filled 2022-01-25: qty 10

## 2022-01-25 MED ORDER — CLOPIDOGREL BISULFATE 75 MG PO TABS
75.0000 mg | ORAL_TABLET | Freq: Every day | ORAL | Status: DC
Start: 2022-01-25 — End: 2022-01-26
  Administered 2022-01-25 – 2022-01-26 (×2): 75 mg via ORAL
  Filled 2022-01-25 (×2): qty 1

## 2022-01-25 MED ORDER — LIDOCAINE HCL (CARDIAC) PF 100 MG/5ML IV SOSY
PREFILLED_SYRINGE | INTRAVENOUS | Status: DC | PRN
  Administered 2022-01-25: 50 mg via INTRAVENOUS

## 2022-01-25 MED ORDER — ONDANSETRON HCL 4 MG/2ML IJ SOLN: 4.0000 mg | Freq: Four times a day (QID) | INTRAMUSCULAR | Status: DC | PRN

## 2022-01-25 MED ORDER — LIDOCAINE HCL (PF) 2 % IJ SOLN
INTRAMUSCULAR | Status: AC
  Filled 2022-01-25: qty 5

## 2022-01-25 MED ORDER — CEFAZOLIN SODIUM-DEXTROSE 2-3 GM-%(50ML) IV SOLR
INTRAVENOUS | Status: DC | PRN
  Administered 2022-01-25: 2 g via INTRAVENOUS

## 2022-01-25 MED ORDER — GUAIFENESIN-DM 100-10 MG/5ML PO SYRP: 15.0000 mL | ORAL_SOLUTION | ORAL | Status: DC | PRN

## 2022-01-25 MED ORDER — INSULIN ASPART 100 UNIT/ML IJ SOLN
5.0000 [IU] | Freq: Once | INTRAMUSCULAR | Status: AC
  Administered 2022-01-25: 5 [IU] via SUBCUTANEOUS

## 2022-01-25 MED ORDER — ACETAMINOPHEN 325 MG PO TABS: 325.0000 mg | ORAL_TABLET | ORAL | Status: DC | PRN

## 2022-01-25 MED ORDER — GLYCOPYRROLATE 0.2 MG/ML IJ SOLN
INTRAMUSCULAR | Status: DC | PRN
  Administered 2022-01-25: .2 mg via INTRAVENOUS

## 2022-01-25 MED ORDER — LIDOCAINE HCL 1 % IJ SOLN
INTRAMUSCULAR | Status: DC | PRN
  Administered 2022-01-25: 10 mL

## 2022-01-25 MED ORDER — FENTANYL CITRATE (PF) 100 MCG/2ML IJ SOLN
INTRAMUSCULAR | Status: AC
  Administered 2022-01-25: 50 ug via INTRAVENOUS
  Filled 2022-01-25: qty 2

## 2022-01-25 MED ORDER — GLYCOPYRROLATE 0.2 MG/ML IJ SOLN
INTRAMUSCULAR | Status: AC
  Filled 2022-01-25: qty 1

## 2022-01-25 MED ORDER — MAGNESIUM SULFATE 2 GM/50ML IV SOLN
2.0000 g | Freq: Every day | INTRAVENOUS | Status: DC | PRN
  Filled 2022-01-25: qty 50

## 2022-01-25 MED ORDER — CEFAZOLIN SODIUM-DEXTROSE 2-4 GM/100ML-% IV SOLN
2.0000 g | Freq: Three times a day (TID) | INTRAVENOUS | Status: AC
  Administered 2022-01-26: 2 g via INTRAVENOUS
  Filled 2022-01-25 (×2): qty 100

## 2022-01-25 MED ORDER — ASPIRIN EC 81 MG PO TBEC
81.0000 mg | DELAYED_RELEASE_TABLET | Freq: Every day | ORAL | Status: DC
  Administered 2022-01-25 – 2022-01-26 (×2): 81 mg via ORAL
  Filled 2022-01-25 (×2): qty 1

## 2022-01-25 SURGICAL SUPPLY — 67 items
ADH SKN CLS APL DERMABOND .7 (GAUZE/BANDAGES/DRESSINGS) ×1
APL PRP STRL LF DISP 70% ISPRP (MISCELLANEOUS) ×1
BAG DECANTER FOR FLEXI CONT (MISCELLANEOUS) ×2 IMPLANT
BLADE SURG 15 STRL LF DISP TIS (BLADE) ×1 IMPLANT
BLADE SURG 15 STRL SS (BLADE) ×2
BLADE SURG SZ11 CARB STEEL (BLADE) ×2 IMPLANT
BOOT SUTURE AID YELLOW STND (SUTURE) ×2 IMPLANT
BRUSH SCRUB EZ  4% CHG (MISCELLANEOUS) ×2
BRUSH SCRUB EZ 4% CHG (MISCELLANEOUS) ×1 IMPLANT
CHLORAPREP W/TINT 26 (MISCELLANEOUS) ×2 IMPLANT
DERMABOND ADVANCED (GAUZE/BANDAGES/DRESSINGS) ×1
DERMABOND ADVANCED .7 DNX12 (GAUZE/BANDAGES/DRESSINGS) ×1 IMPLANT
DRAPE 3/4 80X56 (DRAPES) ×2 IMPLANT
DRAPE INCISE IOBAN 66X45 STRL (DRAPES) ×2 IMPLANT
DRAPE LAPAROTOMY 77X122 PED (DRAPES) ×2 IMPLANT
ELECT CAUTERY BLADE 6.4 (BLADE) ×2 IMPLANT
ELECT REM PT RETURN 9FT ADLT (ELECTROSURGICAL) ×2
ELECTRODE REM PT RTRN 9FT ADLT (ELECTROSURGICAL) ×1 IMPLANT
GAUZE 4X4 16PLY ~~LOC~~+RFID DBL (SPONGE) ×2 IMPLANT
GLOVE SURG SYN 7.0 (GLOVE) ×4 IMPLANT
GLOVE SURG SYN 7.0 PF PI (GLOVE) ×2 IMPLANT
GOWN STRL REUS W/ TWL LRG LVL3 (GOWN DISPOSABLE) ×2 IMPLANT
GOWN STRL REUS W/ TWL XL LVL3 (GOWN DISPOSABLE) ×2 IMPLANT
GOWN STRL REUS W/TWL LRG LVL3 (GOWN DISPOSABLE) ×4
GOWN STRL REUS W/TWL XL LVL3 (GOWN DISPOSABLE) ×4
HEMOSTAT SURGICEL 2X3 (HEMOSTASIS) ×2 IMPLANT
IV NS 250ML (IV SOLUTION) ×2
IV NS 250ML BAXH (IV SOLUTION) ×1 IMPLANT
KIT TURNOVER KIT A (KITS) ×2 IMPLANT
LABEL OR SOLS (LABEL) ×2 IMPLANT
LOOP RED MAXI  1X406MM (MISCELLANEOUS) ×2
LOOP VESSEL MAXI  1X406 RED (MISCELLANEOUS) ×2
LOOP VESSEL MAXI 1X406 RED (MISCELLANEOUS) ×2 IMPLANT
LOOP VESSEL MINI 0.8X406 BLUE (MISCELLANEOUS) ×1 IMPLANT
LOOPS BLUE MINI 0.8X406MM (MISCELLANEOUS) ×1
MANIFOLD NEPTUNE II (INSTRUMENTS) ×2 IMPLANT
NDL FILTER BLUNT 18X1 1/2 (NEEDLE) ×1 IMPLANT
NDL HYPO 25X1 1.5 SAFETY (NEEDLE) ×1 IMPLANT
NEEDLE FILTER BLUNT 18X 1/2SAF (NEEDLE) ×1
NEEDLE FILTER BLUNT 18X1 1/2 (NEEDLE) ×1 IMPLANT
NEEDLE HYPO 25X1 1.5 SAFETY (NEEDLE) ×2 IMPLANT
NS IRRIG 500ML POUR BTL (IV SOLUTION) ×2 IMPLANT
PACK BASIN MAJOR ARMC (MISCELLANEOUS) ×2 IMPLANT
PATCH CAROTID ECM VASC 1X10 (Prosthesis & Implant Heart) ×2 IMPLANT
PENCIL ELECTRO HAND CTR (MISCELLANEOUS) IMPLANT
SET WALTER ACTIVATION W/DRAPE (SET/KITS/TRAYS/PACK) ×2 IMPLANT
SHUNT W TPORT 9FR PRUITT F3 (SHUNT) ×2 IMPLANT
SLING ARM LRG DEEP (SOFTGOODS) ×1 IMPLANT
SPONGE T-LAP 18X18 ~~LOC~~+RFID (SPONGE) ×4 IMPLANT
SUT MNCRL 4-0 (SUTURE) ×2
SUT MNCRL 4-0 27XMFL (SUTURE) ×1
SUT PROLENE 6 0 BV (SUTURE) ×8 IMPLANT
SUT PROLENE 7 0 BV 1 (SUTURE) ×4 IMPLANT
SUT SILK 2 0 (SUTURE) ×2
SUT SILK 2-0 18XBRD TIE 12 (SUTURE) ×1 IMPLANT
SUT SILK 3 0 (SUTURE) ×2
SUT SILK 3-0 18XBRD TIE 12 (SUTURE) ×1 IMPLANT
SUT SILK 4 0 (SUTURE) ×2
SUT SILK 4-0 18XBRD TIE 12 (SUTURE) ×1 IMPLANT
SUT VIC AB 3-0 SH 27 (SUTURE) ×4
SUT VIC AB 3-0 SH 27X BRD (SUTURE) ×2 IMPLANT
SUTURE MNCRL 4-0 27XMF (SUTURE) ×1 IMPLANT
SYR 10ML LL (SYRINGE) ×4 IMPLANT
SYR 20ML LL LF (SYRINGE) ×2 IMPLANT
TRAY FOLEY MTR SLVR 16FR STAT (SET/KITS/TRAYS/PACK) ×2 IMPLANT
TUBING CONNECTING 10 (TUBING) IMPLANT
WATER STERILE IRR 500ML POUR (IV SOLUTION) ×2 IMPLANT

## 2022-01-25 NOTE — Anesthesia Procedure Notes (Signed)
Procedure Name: Intubation ?Date/Time: 01/25/2022 9:00 AM ?Performed by: Rolla Plate, CRNA ?Pre-anesthesia Checklist: Patient identified, Patient being monitored, Timeout performed, Emergency Drugs available and Suction available ?Patient Re-evaluated:Patient Re-evaluated prior to induction ?Oxygen Delivery Method: Circle system utilized ?Preoxygenation: Pre-oxygenation with 100% oxygen ?Induction Type: IV induction ?Ventilation: Mask ventilation without difficulty ?Laryngoscope Size: Mac and 3 ?Grade View: Grade I ?Tube type: Oral ?Tube size: 7.0 mm ?Number of attempts: 1 ?Airway Equipment and Method: Stylet ?Placement Confirmation: ETT inserted through vocal cords under direct vision, positive ETCO2 and breath sounds checked- equal and bilateral ?Secured at: 21 cm ?Tube secured with: Tape ?Dental Injury: Teeth and Oropharynx as per pre-operative assessment  ? ? ? ? ?

## 2022-01-25 NOTE — Plan of Care (Signed)
Continuing with plan of care. 

## 2022-01-25 NOTE — TOC Initial Note (Signed)
Transition of Care (TOC) - Initial/Assessment Note  ? ? ?Patient Details  ?Name: Veronica Murray ?MRN: [id number] ?Date of Birth: 1956/07/11 ? ?Transition of Care (TOC) CM/SW Contact:    ?Allayne Butcher, RN ?Phone Number: ?01/25/2022, 12:53 PM ? ?Clinical Narrative:                 ? ?Transition of Care (TOC) Screening Note ? ? ?Patient Details  ?Name: Veronica Murray ?Date of Birth: 1956-09-19 ? ? ?Transition of Care (TOC) CM/SW Contact:    ?Allayne Butcher, RN ?Phone Number: ?01/25/2022, 12:53 PM ? ? ? ?Transition of Care Department [facility]) has reviewed patient and no TOC needs have been identified at this time. We will continue to monitor patient advancement through interdisciplinary progression rounds. If new patient transition needs arise, please place a TOC consult. ?  ? ?Expected Discharge Plan: Home/Self Care ?Barriers to Discharge: Continued Medical Work up ? ? ?Patient Goals and CMS Choice ?  ?  ?  ? ?Expected Discharge Plan and Services ?Expected Discharge Plan: Home/Self Care ?  ?  ?  ?  ?Expected Discharge Date: 01/24/22               ?  ?  ?  ?  ?  ?  ?  ?  ?  ?  ? ?Prior Living Arrangements/Services ?  ?  ?  ?       ?  ?  ?  ?  ? ?Activities of Daily Living ?Home Assistive Devices/Equipment: None ?ADL Screening (condition at time of admission) ?Patient's cognitive ability adequate to safely complete daily activities?: Yes ?Is the patient deaf or have difficulty hearing?: No ?Does the patient have difficulty seeing, even when wearing glasses/contacts?: Yes ?Does the patient have difficulty concentrating, remembering, or making decisions?: No ?Patient able to express need for assistance with ADLs?: Yes ?Does the patient have difficulty dressing or bathing?: No ?Independently performs ADLs?: Yes (appropriate for developmental age) ?Does the patient have difficulty walking or climbing stairs?: No ?Weakness of Legs: None ?Weakness of Arms/Hands: None ? ?Permission Sought/Granted ?  ?  ?   ?   ?   ?    ? ?Emotional Assessment ?  ?  ?  ?  ?  ?  ? ?Admission diagnosis:  Carotid stenosis, left [I65.22] ?Patient Active Problem List  ? Diagnosis Date Noted  ? Carotid stenosis, left 01/24/2022  ? Carotid stenosis 01/18/2022  ? CAD (coronary artery disease) 01/18/2022  ? Essential hypertension 01/18/2022  ? Abnormal stress test   ? Troponin I above reference range 06/25/2019  ? ?PCP:  Patient, No Pcp Per (Inactive) ?Pharmacy:   ?[id number] [place], [place] - [address] AT [facility] ?[address] ?[place] [address] ?Phone: [phone] Fax: [phone] ? ? ? ? ?Social Determinants of Health (SDOH) Interventions ?  ? ?Readmission Risk Interventions ?No flowsheet data found. ? ? ?

## 2022-01-25 NOTE — Plan of Care (Signed)
?  Problem: Education: ?Goal: Knowledge of General Education information will improve ?Description: Including pain rating scale, medication(s)/side effects and non-pharmacologic comfort measures ?Outcome: Progressing ?  ?Problem: Health Behavior/Discharge Planning: ?Goal: Ability to manage health-related needs will improve ?Outcome: Progressing ?  ?Problem: Clinical Measurements: ?Goal: Ability to maintain clinical measurements within normal limits will improve ?Outcome: Progressing ?Goal: Will remain free from infection ?Outcome: Progressing ?Goal: Diagnostic test results will improve ?Outcome: Progressing ?Goal: Respiratory complications will improve ?Outcome: Progressing ?Goal: Cardiovascular complication will be avoided ?Outcome: Progressing ?  ?Problem: Activity: ?Goal: Risk for activity intolerance will decrease ?Outcome: Progressing ?  ?Problem: Nutrition: ?Goal: Adequate nutrition will be maintained ?Outcome: Progressing ?  ?Problem: Coping: ?Goal: Level of anxiety will decrease ?Outcome: Not Progressing ?Note: Patient nervous about surgery stating that many of her family members keep telling her that she needs a second opinion and the surgery is very dangerous. ?  ?Problem: Elimination: ?Goal: Will not experience complications related to bowel motility ?Outcome: Progressing ?Goal: Will not experience complications related to urinary retention ?Outcome: Progressing ?  ?Problem: Pain Managment: ?Goal: General experience of comfort will improve ?Outcome: Progressing ?  ?Problem: Safety: ?Goal: Ability to remain free from injury will improve ?Outcome: Progressing ?  ?Problem: Skin Integrity: ?Goal: Risk for impaired skin integrity will decrease ?Outcome: Progressing ?  ?

## 2022-01-25 NOTE — Op Note (Signed)
[facility] ? ? ?OPERATIVE NOTE ? ?PROCEDURE:   ?1.  Left carotid endarterectomy with CorMatrix arterial patch reconstruction ? ?PRE-OPERATIVE DIAGNOSIS: 1.  Left carotid stenosis ?2.ocular strokes ? ?POST-OPERATIVE DIAGNOSIS: same as above  ? ?SURGEON: Festus Barren, MD ? ?ASSISTANT(S): none ? ?ANESTHESIA: general ? ?ESTIMATED BLOOD LOSS: 25 cc ? ?FINDING(S): ?1.  left carotid plaque. ? ?SPECIMEN(S):  Carotid plaque (sent to Pathology) ? ?INDICATIONS:   ?CAMBRYN CHARTERS is a 66 y.o. female who presents with ocular stroke and critical carotid stenosis of >95%.  I discussed with the patient the risks, benefits, and alternatives to carotid endarterectomy.  I discussed the differences between carotid stenting and carotid endarterectomy. I discussed the procedural details of carotid endarterectomy with the patient.  The patient is aware that the risks of carotid endarterectomy include but are not limited to: bleeding, infection, stroke, myocardial infarction, death, cranial nerve injuries both temporary and permanent, neck hematoma, possible airway compromise, labile blood pressure post-operatively, cerebral hyperperfusion syndrome, and possible need for additional interventions in the future. The patient is aware of the risks and agrees to proceed forward with the procedure. ? ?DESCRIPTION: ?After full informed written consent was obtained from the patient, the patient was brought back to the operating room and placed supine upon the operating table.  Prior to induction, the patient received IV antibiotics.  After obtaining adequate anesthesia, the patient was placed into a modified beach chair position with a shoulder roll in place and the patient's neck slightly hyperextended and rotated away from the surgical site.  The patient was prepped in the standard fashion for a carotid endarterectomy.  I made an incision anterior to the sternocleidomastoid muscle and dissected down through the  subcutaneous tissue.  The platysmas was opened with electrocautery.  Then I dissected down to the internal jugular vein and facial vein.  The facial vein is ligated and divided between 2-0 silk ties.  This was dissected posteriorly until I obtained visualization of the common carotid artery.  This was dissected out and then a vessel loop was placed around the common carotid artery.  I then dissected in a periadventitial fashion along the common carotid artery up to the bifurcation.  I then identified the external carotid artery and the superior thyroid artery.  I placed a vessel loop around the superior thyroid artery, and I also dissected out the external carotid artery and placed a vessel loop around it. In the process of this dissection, the hypoglossal nerve was identified and protected from harm.  I then dissected out the internal carotid artery until I identified an area in the internal carotid artery clearly above the stenosis.  I dissected slightly distal to this area, and placed a vessel loop around the artery.  At this point, we gave the patient 5000 units of intravenous heparin.  After this was allowed to circulate for several minutes, I pulled up control on the vessel loops to clamp the internal carotid artery, external carotid artery, superior thyroid artery, and then the common carotid artery.  I then made an arteriotomy in the common carotid artery with a 11 blade, and extended the arteriotomy with a Potts scissor down into the common carotid artery, then I carried the arteriotomy through the bifurcation into the internal carotid artery until I reached an area that was not diseased.  At this point, I took the [place] shunt that previously been prepared and I inserted it into the internal carotid artery first, and then into  the common carotid artery taking care to flush and de-air prior to release of control. At this point, I started the endarterectomy in the common carotid artery with a  [place]field elevator and carried this dissection down into the common carotid artery circumferentially.  Then I transected the plaque at a segment where it was adherent and transected the plaque with Potts scissors.  I then carried this dissection up into the external carotid artery.  The plaque was extracted by unclamping the external carotid artery and performing an eversion endarterectomy.  The dissection was then carried into the internal carotid artery where a nice feathered end point was created with gentle traction.  I passed the plaque off the field as a specimen. At this point I removed all loose flecks and remaining disease possible.  At this point, I was satisfied that the minimal remaining disease was densely adherent to the wall and wall integrity was intact. The distal endpoint was tacked down with two 7-0 Prolene sutures.  I then fashioned a CorMatrix arterial patch for the artery and sewed it in place with two running stitch of 6-0 Prolene.  I started at the distal endpoint and ran one half the length of the arteriotomy.  I then cut and beveled the patch to an appropriate length to match the arteriotomy.  I started the second 6-0 Prolene at the proximal end point.  The medial suture line was completed and the lateral suture line was run approximately one quarter the length of the arteriotomy.  Prior to completing this patch angioplasty, I removed the shunt first from the internal carotid artery, from which there was excellent backbleeding, and clamped it.  Then I removed the shunt from the common carotid artery, from which there was excellent antegrade bleeding, and then clamped it.  At this point, I allowed the external carotid artery to backbleed, which was excellent.  Then I instilled heparinized saline in this patched artery and then completed the patch angioplasty in the usual fashion.  First, I released the clamp on the external carotid artery, then I released it on the common carotid artery.   After waiting a few seconds, I then released it on the internal carotid artery. Several minutes of pressure were held and 6-0 Prolene patch sutures were used as need for hemostasis.  At this point, I placed Surgicel and Evicel topical hemostatic agents.  There was no more active bleeding in the surgical site.  The sternocleidomastoid space was closed with three interrupted 3-0 Vicryl sutures. I then reapproximated the platysma muscle with a running stitch of 3-0 Vicryl.  The skin was then closed with a running subcuticular 4-0 Monocryl.  The skin was then cleaned, dried and Dermabond was used to reinforce the skin closure.  The patient awakened and was taken to the recovery room in stable condition, following commands and moving all four extremities without any apparent deficits. ?   ?COMPLICATIONS: none ? ?CONDITION: stable ? ?Festus Barren ? ?01/25/2022, 10:54 AM ? ? ? ?This note was created with Dragon Medical transcription system. Any errors in dictation are purely unintentional.  ?

## 2022-01-25 NOTE — Anesthesia Preprocedure Evaluation (Addendum)
Anesthesia Evaluation  ?Patient identified by MRN, date of birth, ID band ?Patient awake ? ? ? ?Reviewed: ?Allergy & Precautions, NPO status , Patient's Chart, lab work & pertinent test results ? ?History of Anesthesia Complications ?Negative for: history of anesthetic complications ? ?Airway ?Mallampati: III ? ? ?Neck ROM: Full ? ? ? Dental ? ?(+) Poor Dentition ?  ?Pulmonary ?Current Smoker (2 ppd) and Patient abstained from smoking.,  ?  ?Pulmonary exam normal ?breath sounds clear to auscultation ? ? ? ? ? ? Cardiovascular ?hypertension, + Peripheral Vascular Disease (carotid stenosis on Plavix)  ?Normal cardiovascular exam ?Rhythm:Regular Rate:Normal ? ? ?  ?Neuro/Psych ?negative neurological ROS ?   ? GI/Hepatic ?negative GI ROS,   ?Endo/Other  ?diabetes, Type 2Obesity  ? Renal/GU ?negative Renal ROS  ? ?  ?Musculoskeletal ? ?(+) Fibromyalgia - ? Abdominal ?  ?Peds ? Hematology ?negative hematology ROS ?(+)   ?Anesthesia Other Findings ? ? Reproductive/Obstetrics ? ?  ? ? ? ? ? ? ? ? ? ? ? ? ? ?  ?  ? ? ? ? ? ? ? ?Anesthesia Physical ?Anesthesia Plan ? ?ASA: 4 ? ?Anesthesia Plan: General  ? ?Post-op Pain Management:   ? ?Induction: Intravenous ? ?PONV Risk Score and Plan: 2 and Ondansetron, Dexamethasone and Treatment may vary due to age or medical condition ? ?Airway Management Planned: Oral ETT ? ?Additional Equipment: Arterial line ? ?Intra-op Plan:  ? ?Post-operative Plan: Extubation in OR ? ?Informed Consent: I have reviewed the patients History and Physical, chart, labs and discussed the procedure including the risks, benefits and alternatives for the proposed anesthesia with the patient or authorized representative who has indicated his/her understanding and acceptance.  ? ? ? ?Dental advisory given ? ?Plan Discussed with: CRNA ? ?Anesthesia Plan Comments: (Patient consented for risks of anesthesia including but not limited to:  ?- adverse reactions to medications ?-  damage to eyes, teeth, lips or other oral mucosa ?- nerve damage due to positioning  ?- sore throat or hoarseness ?- damage to heart, brain, nerves, lungs, other parts of body or loss of life ? ?Informed patient about role of CRNA in peri- and intra-operative care.  Patient voiced understanding.)  ? ? ? ? ? ? ?Anesthesia Quick Evaluation ? ?

## 2022-01-25 NOTE — Interval H&P Note (Signed)
History and Physical Interval Note: ? ?01/25/2022 ?7:28 AM ? ?Veronica Murray  has presented today for surgery, with the diagnosis of left carotid stenosis.  The various methods of treatment have been discussed with the patient and family. After consideration of risks, benefits and other options for treatment, the patient has consented to  Procedure(s): ?ENDARTERECTOMY CAROTID, possible ligation (Left) as a surgical intervention.  The patient's history has been reviewed, patient examined, no change in status, stable for surgery.  I have reviewed the patient's chart and labs.  Questions were answered to the patient's satisfaction.   ?Patient is now for left carotid endarterectomy as anatomy was not suitable for stenting. Possible ligation if it has thrombus and no flow. ? ?Veronica Murray ? ? ?
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
## 2022-01-25 NOTE — Anesthesia Procedure Notes (Signed)
Arterial Line Insertion ?Start/End3/17/2023 8:55 AM, 01/25/2022 9:00 AM ?Performed by: Darrin Nipper, MD, anesthesiologist ? Patient location: OR. ?Preanesthetic checklist: patient identified, IV checked, site marked, risks and benefits discussed, surgical consent, monitors and equipment checked, pre-op evaluation, timeout performed and anesthesia consent ?Left, radial was placed ?Catheter size: 20 G ?Hand hygiene performed , maximum sterile barriers used  and Seldinger technique used ? ?Attempts: 1 ?Procedure performed without using ultrasound guided technique. ?Following insertion, dressing applied and Biopatch. ?Post procedure assessment: normal and unchanged ? ?Patient tolerated the procedure well with no immediate complications. ? ? ?

## 2022-01-25 NOTE — Progress Notes (Incomplete)
Nurse went into patients room to give gabapentin as pt. Had stated earlier she needed as she takes 300 mg TID for her fibromyalgia, medication had to be added to hospital med regimen. Nurse scanned pt. Then pill, placed pill in cup and gave to patient, patient at this time was on a phone call via face time with a woman. When nurse gave pt. The cup with pill patient stated "you got this wrong", nurse responded "how is it wrong? 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 The patient told the woman on the phone to be quiet and the woman on the phone then stated "no she better be quiet before she loses her job". The nurse then began speaking back to the patient regarding the medication and the patient  ?

## 2022-01-25 NOTE — Progress Notes (Signed)
Nutrition Brief Note ? ?Patient identified on the Malnutrition Screening Tool (MST) Report ? ?Wt Readings from Last 15 Encounters:  ?01/24/22 77.6 kg  ?01/18/22 77.6 kg  ?07/05/19 73.9 kg  ?06/26/19 75.9 kg  ? ?Veronica Murray is a 66 y.o. female.  I am asked to see the patient by Dr. Rolley Sims for evaluation of carotid stenosis with multiple amaurosis fugax episodes over the past couple of weeks.  The patient has a previous history of heart disease with coronary intervention about 2 to 3 years ago.  These amaurosis episodes have been multiple.  The patient reports headaches associated with the events.  She denies any fevers or chills.  No arm or leg weakness or numbness.  No speech or swallowing difficulty.  She had a carotid duplex which I have reviewed which suggest 50 to 69% right ICA stenosis and greater than 70% left ICA stenosis.  As such, she is referred for further evaluation and treatment ? ?Pt admitted with carotid stenosis with multiple amaurosis fugax.  ? ?3/16- s/p ENDARTERECTOMY CAROTID ? ?Reviewed I/O's: +668 ml x 24 hours ? ?UOP: 0 ml x 24 hours ? ?Pt unavailable at time of visit. Pt down in OR at time of visit. RD unable to obtain further nutrition-related history or complete nutrition-focused physical exam at this time.    ? ?Pt currently NPO for lt carotid PTA/sent intervention.  ? ?Pt previously on carb modified diet. Pt with good appetite. Noted meal completions 100%. ? ?Lab Results  ?Component Value Date  ? HGBA1C 11.4 (H) 06/25/2019  ? PTA DM medications are 40 units insulin glargine daily.  ? ?Labs reviewed: Na: 132, CBGS: 177-299 (inpatient orders for glycemic control are 0-15 units insulin aspart TID with meals, 0-5 units insulin aspart daily at bedtime, 5 units insulin aspart daily, and 20 units insulin glargine-yfgn daily).   ? ?Body mass index is 31.28 kg/m?Marland Kitchen Patient meets criteria for obesity, class I based on current BMI.  ? ?Current diet order is carb modified, patient is consuming  approximately 100% of meals at this time. Labs and medications reviewed.  ? ?No nutrition interventions warranted at this time. If nutrition issues arise, please consult RD.  ? ?Levada Schilling, RD, LDN, CDCES ?Registered Dietitian II ?Certified Diabetes Care and Education Specialist ?Please refer to [facility] for RD and/or RD on-call/weekend/after hours pager   ?

## 2022-01-25 NOTE — Transfer of Care (Signed)
Immediate Anesthesia Transfer of Care Note ? ?Patient: Veronica Murray ? ?Procedure(s) Performed: ENDARTERECTOMY CAROTID, possible ligation (Left) ? ?Patient Location: PACU ? ?Anesthesia Type:General ? ?Level of Consciousness: awake and patient cooperative ? ?Airway & Oxygen Therapy: Patient Spontanous Breathing and Patient connected to face mask oxygen ? ?Post-op Assessment: Report given to RN, Post -op Vital signs reviewed and stable and Patient moving all extremities X 4 ? ?Post vital signs: Reviewed ? ?Last Vitals:  ?Vitals Value Taken Time  ?BP    ?Temp    ?Pulse    ?Resp 18 01/25/22 1052  ?SpO2    ?Vitals shown include unvalidated device data. ? ?Last Pain:  ?Vitals:  ? 01/25/22 0729  ?TempSrc:   ?PainSc: 0-No pain  ?   ? ?Patients Stated Pain Goal: 4 (01/24/22 1920) ? ?Complications: No notable events documented. ?

## 2022-01-25 NOTE — Anesthesia Postprocedure Evaluation (Signed)
Anesthesia Post Note ? ?Patient: Veronica Murray ? ?Procedure(s) Performed: ENDARTERECTOMY CAROTID, possible ligation (Left) ? ?Patient location during evaluation: PACU ?Anesthesia Type: General ?Level of consciousness: awake and alert, oriented and patient cooperative ?Pain management: pain level controlled ?Vital Signs Assessment: post-procedure vital signs reviewed and stable ?Respiratory status: spontaneous breathing, nonlabored ventilation and respiratory function stable ?Cardiovascular status: blood pressure returned to baseline and stable ?Postop Assessment: adequate PO intake ?Anesthetic complications: no ? ? ?No notable events documented. ? ? ?Last Vitals:  ?Vitals:  ? 01/25/22 1308 01/25/22 1400  ?BP:  (!) 80/71  ?Pulse:  72  ?Resp:  15  ?Temp: 36.8 ?C 36.8 ?C  ?SpO2:  99%  ?  ?Last Pain:  ?Vitals:  ? 01/25/22 1334  ?TempSrc:   ?PainSc: 2   ? ? ?  ?  ?  ?  ?  ?  ? ?[name] ? ? ? ? ?

## 2022-01-26 DIAGNOSIS — Z48812 Encounter for surgical aftercare following surgery on the circulatory system: Secondary | ICD-10-CM

## 2022-01-26 LAB — BASIC METABOLIC PANEL
Anion gap: 10 (ref 5–15)
BUN: 17 mg/dL (ref 8–23)
CO2: 22 mmol/L (ref 22–32)
Calcium: 8.8 mg/dL — ABNORMAL LOW (ref 8.9–10.3)
Chloride: 105 mmol/L (ref 98–111)
Creatinine, Ser: 0.66 mg/dL (ref 0.44–1.00)
GFR, Estimated: >60 mL/min (ref >60)
Glucose, Bld: 129 mg/dL — ABNORMAL HIGH (ref 70–99)
Potassium: 3.7 mmol/L (ref 3.5–5.1)
Sodium: 137 mmol/L (ref 135–145)

## 2022-01-26 LAB — GLUCOSE, CAPILLARY
Glucose-Capillary: 110 mg/dL — ABNORMAL HIGH (ref 70–99)
Glucose-Capillary: 132 mg/dL — ABNORMAL HIGH (ref 70–99)
Glucose-Capillary: 133 mg/dL — ABNORMAL HIGH (ref 70–99)
Glucose-Capillary: 152 mg/dL — ABNORMAL HIGH (ref 70–99)
Glucose-Capillary: 163 mg/dL — ABNORMAL HIGH (ref 70–99)
Glucose-Capillary: 167 mg/dL — ABNORMAL HIGH (ref 70–99)

## 2022-01-26 LAB — CBC
HCT: 32.4 % — ABNORMAL LOW (ref 36.0–46.0)
Hemoglobin: 11 g/dL — ABNORMAL LOW (ref 12.0–15.0)
MCH: 30.2 pg (ref 26.0–34.0)
MCHC: 34 g/dL (ref 30.0–36.0)
MCV: 89 fL (ref 80.0–100.0)
Platelets: 273 10*3/uL (ref 150–400)
RBC: 3.64 MIL/uL — ABNORMAL LOW (ref 3.87–5.11)
RDW: 12.2 % (ref 11.5–15.5)
WBC: 11.4 10*3/uL — ABNORMAL HIGH (ref 4.0–10.5)
nRBC: 0 % (ref 0.0–0.2)

## 2022-01-26 MED ORDER — INSULIN DETEMIR 100 UNIT/ML ~~LOC~~ SOLN: 20.0000 [IU] | Freq: Two times a day (BID) | SUBCUTANEOUS | Status: DC

## 2022-01-26 MED ORDER — ASPIRIN 81 MG PO TBEC: 81.0000 mg | DELAYED_RELEASE_TABLET | Freq: Every day | ORAL | 11 refills | Status: DC

## 2022-01-26 MED ORDER — INSULIN DETEMIR 100 UNIT/ML ~~LOC~~ SOLN
20.0000 [IU] | Freq: Two times a day (BID) | SUBCUTANEOUS | Status: DC
Start: 2022-01-26 — End: 2022-01-26
  Administered 2022-01-26: 20 [IU] via SUBCUTANEOUS
  Filled 2022-01-26 (×2): qty 0.2

## 2022-01-26 MED ORDER — OXYCODONE-ACETAMINOPHEN 5-325 MG PO TABS: 1.0000 | ORAL_TABLET | Freq: Four times a day (QID) | ORAL | 0 refills | Status: DC | PRN

## 2022-01-26 MED ORDER — INSULIN ASPART 100 UNIT/ML IJ SOLN
2.0000 [IU] | INTRAMUSCULAR | Status: DC
  Administered 2022-01-26: 2 [IU] via SUBCUTANEOUS
  Filled 2022-01-26: qty 1

## 2022-01-26 NOTE — Plan of Care (Signed)
Continuing with plan of care. 

## 2022-01-26 NOTE — Plan of Care (Signed)
Patient discharging home  

## 2022-01-26 NOTE — Discharge Summary (Signed)
?[name]    ?Discharge Summary ? ? ? ?Patient ID:  ?Veronica Murray ?MRN: [id number] ?DOB/AGE: 1956-01-06 66 y.o. ? ?Admit date: 01/24/2022 ?Discharge date: 01/26/2022 ?Date of Surgery: 01/25/2022 ?Surgeon: Surgeon(s): ?[name], MD ? ?Admission Diagnosis: ?Carotid stenosis, left [I65.22] ? ?Discharge Diagnoses:  ?Carotid stenosis, left [I65.22] ? ?Secondary Diagnoses: ?Past Medical History:  ?Diagnosis Date  ? Aortic arch atherosclerosis ([facility])   ? Diabetes mellitus without complication ([facility])   ? Essential hypertension   ? Fibromyalgia   ? Tobacco abuse   ? ? ?Procedure(s): ?ENDARTERECTOMY CAROTID, possible ligation ? ?Discharged Condition: good ? ?HPI:  ?Patient admitted with LEFT Carotid stenosis. Underwent elective LEFT Carotid endarterectomy. Perioperative course was unremarkable. The patient remained hemodynamically and neurological stable and intact. ? ?Hospital Course:  ?[name] is a 66 y.o. female is S/P Left CEA ?Procedure(s): ?ENDARTERECTOMY CAROTID, possible ligation ?Extubated: POD # 0 ?Physical exam: Alert, NAD, Neuro Intact, Incision- C/D/I, soft, no hematoma ?Post-op wounds healing well ?Pt. Ambulating, voiding and taking PO diet without difficulty. ?Pt pain controlled with PO pain meds. ?Labs as below ?Complications:none ? ?Consults:  ? ? ?Significant Diagnostic Studies: ?CBC ?Lab Results  ?Component Value Date  ? WBC 11.4 (H) 01/26/2022  ? HGB 11.0 (L) 01/26/2022  ? HCT 32.4 (L) 01/26/2022  ? MCV 89.0 01/26/2022  ? PLT 273 01/26/2022  ? ? ?BMET ?   ?Component Value Date/Time  ? NA 137 01/26/2022 0401  ? K 3.7 01/26/2022 0401  ? CL 105 01/26/2022 0401  ? CO2 22 01/26/2022 0401  ? GLUCOSE 129 (H) 01/26/2022 0401  ? BUN 17 01/26/2022 0401  ? CREATININE 0.66 01/26/2022 0401  ? CALCIUM 8.8 (L) 01/26/2022 0401  ? GFRNONAA >60 01/26/2022 0401  ? GFRAA >60 06/26/2019 0434  ? ?COAG ?Lab Results  ?Component Value Date  ? INR 0.9 06/25/2019  ? ? ? ?Disposition:   ?Discharge to :Home ?Discharge Instructions   ? ? CAROTID [place]: Call MD for difficulty swallowing or speaking; weakness in arms or legs that is a new symtom; severe headache.  If you have increased swelling in the neck and/or  are having difficulty breathing, CALL 911   Complete by: As directed ?  ? Call MD for:  redness, tenderness, or signs of infection (pain, swelling, bleeding, redness, odor or green/yellow discharge around incision site)   Complete by: As directed ?  ? Call MD for:  redness, tenderness, or signs of infection (pain, swelling, bleeding, redness, odor or green/yellow discharge around incision site)   Complete by: As directed ?  ? Call MD for:  severe or increased pain, loss or decreased feeling  in affected limb(s)   Complete by: As directed ?  ? Call MD for:  severe or increased pain, loss or decreased feeling  in affected limb(s)   Complete by: As directed ?  ? Call MD for:  temperature >100.5   Complete by: As directed ?  ? Call MD for:  temperature >100.5   Complete by: As directed ?  ? Discharge instructions   Complete by: As directed ?  ? Call/return for increased swelling, redness, pain, bleeding at incision site  ? Driving Restrictions   Complete by: As directed ?  ? No driving for 24 hours  ? Driving Restrictions   Complete by: As directed ?  ? No driving for until follow up with Dr. Wyn Quaker  ? Increase activity slowly   Complete by: As directed ?  ?  Walk with assistance use walker or cane as needed  ? Lifting restrictions   Complete by: As directed ?  ? No lifting for 24 hours  ? Lifting restrictions   Complete by: As directed ?  ? No heavy lifting for Until follow up with Dr. Wyn Quaker  ? May shower    Complete by: As directed ?  ? No dressing needed   Complete by: As directed ?  ? Replace only if drainage present  ? No dressing needed   Complete by: As directed ?  ? Replace only if drainage present  ? Resume previous diet   Complete by: As directed ?  ? Resume previous diet   Complete by: As  directed ?  ? ?  ? ?Allergies as of 01/26/2022   ? ?   Reactions  ? Codeine Hives, Shortness Of Breath, Nausea Only  ? Toradol [ketorolac Tromethamine] Hives, Shortness Of Breath, Nausea Only  ? Tramadol Hcl Shortness Of Breath, Nausea Only  ? "Conflicts with bipolar condition"  ? ?  ? ?  ?Medication List  ?  ? ?STOP taking these medications   ? ?albuterol 108 (90 Base) MCG/ACT inhaler ?Commonly known as: VENTOLIN HFA ?  ?atorvastatin 20 MG tablet ?Commonly known as: LIPITOR ?  ?clopidogrel 75 MG tablet ?Commonly known as: PLAVIX ?  ?gabapentin 300 MG capsule ?Commonly known as: NEURONTIN ?  ?Lantus 100 UNIT/ML injection ?Generic drug: insulin glargine ?  ?metoprolol tartrate 25 MG tablet ?Commonly known as: LOPRESSOR ?  ? ?  ? ?TAKE these medications   ? ?apixaban 5 MG Tabs tablet ?Commonly known as: Eliquis ?Take 1 tablet (5 mg total) by mouth 2 (two) times daily. ?  ?aspirin 81 MG EC tablet ?Take 1 tablet (81 mg total) by mouth daily. ?What changed: Another medication with the same name was changed. Make sure you understand how and when to take each. ?  ?aspirin 81 MG EC tablet ?Take 1 tablet (81 mg total) by mouth daily. Swallow whole. ?Start taking on: January 27, 2022 ?What changed: additional instructions ?  ?BIOFREEZE EX ?Apply 1 application topically 2 (two) times daily as needed (pain). ?  ?Ibuprofen 200 MG Caps ?Take 800 mg by mouth 2 (two) times daily. ?  ?Insulin Pen Needle 30G X 8 MM Misc ?Commonly known as: NOVOFINE ?Inject 10 each into the skin as needed. ?  ?oxyCODONE-acetaminophen 5-325 MG tablet ?Commonly known as: PERCOCET/ROXICET ?Take 1 tablet by mouth every 6 (six) hours as needed for moderate pain. ?What changed:  ?when to take this ?reasons to take this ?  ?rosuvastatin 10 MG tablet ?Commonly known as: Crestor ?Take 1 tablet (10 mg total) by mouth daily. ?  ? ?  ? ?Verbal and written Discharge instructions given to the patient. Wound care per Discharge AVS ? Follow-up Information   ? ? [name], MD Follow up in 1 week(s).   ?Specialties: Vascular Surgery, Radiology, Interventional Cardiology ?Contact information: ?[address] ?[place] [address] ?[phone] ? ? ?  ?  ? ?  ?  ? ?  ? ? ?Signed: ?Bertram Denver, MD ? ?01/26/2022, 10:08 AM ? ? ?  ? ?

## 2022-01-26 NOTE — Progress Notes (Signed)
A-Line removed without complications and pressure dressing applied.  PIV's also removed without complications, foley discontinued and patient did have a void post removal.  Patient is in stable condition.   Discharge teaching completed with patient. ?

## 2022-01-28 LAB — SURGICAL PATHOLOGY

## 2022-02-01 ENCOUNTER — Other Ambulatory Visit: Payer: Self-pay

## 2022-02-01 ENCOUNTER — Encounter (INDEPENDENT_AMBULATORY_CARE_PROVIDER_SITE_OTHER): Payer: Self-pay | Admitting: Vascular Surgery

## 2022-02-01 ENCOUNTER — Ambulatory Visit (INDEPENDENT_AMBULATORY_CARE_PROVIDER_SITE_OTHER): Payer: Medicare HMO | Admitting: Vascular Surgery

## 2022-02-01 VITALS — BP 142/76 | HR 84 | Resp 16 | Ht 62.0 in | Wt 171.0 lb

## 2022-02-01 DIAGNOSIS — I10 Essential (primary) hypertension: Secondary | ICD-10-CM

## 2022-02-01 DIAGNOSIS — I6522 Occlusion and stenosis of left carotid artery: Secondary | ICD-10-CM

## 2022-02-01 NOTE — Progress Notes (Signed)
? ? ?Patient ID: Veronica Murray, female   DOB: [birth year], 66 y.o.   MRN: [id number] ? ?Chief Complaint  ?Patient presents with  ? Claudication  ?  1 week post op  ? ? ?HPI ?Veronica Murray is a 66 y.o. female.  Patient returns in follow-up 1 week following left carotid endarterectomy.  She had high-grade symptomatic stenosis with visual symptoms.  Her anatomy was not appropriate for stenting.  She is doing well.  No focal neurologic symptoms.  Her incision is healing well.  Minimal neck swelling. ? ? ?Past Medical History:  ?Diagnosis Date  ? Aortic arch atherosclerosis ([place])   ? Diabetes mellitus without complication ([place])   ? Essential hypertension   ? Fibromyalgia   ? Tobacco abuse   ? ? ?Past Surgical History:  ?Procedure Laterality Date  ? CAROTID PTA/STENT INTERVENTION Left 01/24/2022  ? Procedure: CAROTID PTA/STENT INTERVENTION;  Surgeon: Algernon Huxley, MD;  Location: [facility];  Service: Cardiovascular;  Laterality: Left;  ? CERVICAL BIOPSY    ? CHOLECYSTECTOMY    ? ENDARTERECTOMY Left 01/25/2022  ? Procedure: ENDARTERECTOMY CAROTID, possible ligation;  Surgeon: Algernon Huxley, MD;  Location: [facility];  Service: Vascular;  Laterality: Left;  ? HERNIA REPAIR    ? LEFT HEART CATH AND CORONARY ANGIOGRAPHY Left 07/05/2019  ? Procedure: LEFT HEART CATH AND CORONARY ANGIOGRAPHY;  Surgeon: Wellington Hampshire, MD;  Location: [facility] [facility];  Service: Cardiovascular;  Laterality: Left;  ? ? ? ? ?Allergies  ?Allergen Reactions  ? Codeine Hives, Shortness Of Breath and Nausea Only  ? Toradol [Ketorolac Tromethamine] Hives, Shortness Of Breath and Nausea Only  ? Tramadol Hcl Shortness Of Breath and Nausea Only  ?  "Conflicts with bipolar condition"  ? ? ?Current Outpatient Medications  ?Medication Sig Dispense Refill  ? apixaban (ELIQUIS) 5 MG TABS tablet Take 1 tablet (5 mg total) by mouth 2 (two) times daily. 60 tablet 2  ? aspirin EC 81 MG EC tablet Take 1 tablet (81 mg total) by mouth daily. Swallow  whole. 30 tablet 11  ? Ibuprofen 200 MG CAPS Take 800 mg by mouth 2 (two) times daily.    ? rosuvastatin (CRESTOR) 10 MG tablet Take 1 tablet (10 mg total) by mouth daily. 30 tablet 5  ? aspirin EC 81 MG EC tablet Take 1 tablet (81 mg total) by mouth daily. (Patient not taking: Reported on 02/01/2022) 30 tablet 0  ? Insulin Pen Needle (NOVOFINE) 30G X 8 MM MISC Inject 10 each into the skin as needed. (Patient not taking: Reported on 02/01/2022) 100 each 0  ? Menthol, Topical Analgesic, (BIOFREEZE EX) Apply 1 application topically 2 (two) times daily as needed (pain). (Patient not taking: Reported on 02/01/2022)    ? oxyCODONE-acetaminophen (PERCOCET/ROXICET) 5-325 MG tablet Take 1 tablet by mouth every 6 (six) hours as needed for moderate pain. (Patient not taking: Reported on 02/01/2022) 10 tablet 0  ? ?No current facility-administered medications for this visit.  ? ? ? ? ? ? ?Physical Exam ?BP (!) 142/76 (BP Location: Left Arm)   Pulse 84   Resp 16   Ht 5\' 2"  (1.575 m)   Wt 171 lb (77.6 kg)   BMI 31.28 kg/m?  ?Gen:  WD/WN, NAD ?Skin: incision C/D/I ?Neuro: Awake, alert and oriented.  No focal deficits noted. ? ? ? ? ?Assessment/Plan: ? ?Carotid stenosis, left ?Doing well.  Return in 1 month with carotid duplex.  May resume all normal  activities as tolerated. ? ?Essential hypertension ?blood pressure control important in reducing the progression of atherosclerotic disease. On appropriate oral medications. ? ? ? ? ? ?Leotis Pain ?02/01/2022, 11:45 AM ? ? ?This note was created with Dragon medical transcription system.  Any errors from dictation are unintentional.    ?

## 2022-02-01 NOTE — Assessment & Plan Note (Signed)
blood pressure control important in reducing the progression of atherosclerotic disease. On appropriate oral medications.  

## 2022-02-01 NOTE — Assessment & Plan Note (Signed)
Doing well.  Return in 1 month with carotid duplex.  May resume all normal activities as tolerated. ?

## 2022-02-06 ENCOUNTER — Encounter (INDEPENDENT_AMBULATORY_CARE_PROVIDER_SITE_OTHER): Payer: Self-pay

## 2022-02-06 ENCOUNTER — Encounter (INDEPENDENT_AMBULATORY_CARE_PROVIDER_SITE_OTHER): Payer: Self-pay | Admitting: Nurse Practitioner

## 2022-02-22 ENCOUNTER — Ambulatory Visit
Admission: EM | Admit: 2022-02-22 | Discharge: 2022-02-22 | Disposition: A | Discharge status: Home / Self Care | Payer: Medicare HMO | Attending: Urgent Care | Admitting: Urgent Care

## 2022-02-22 DIAGNOSIS — M542 Cervicalgia: Secondary | ICD-10-CM | POA: Diagnosis not present

## 2022-02-22 DIAGNOSIS — E11628 Type 2 diabetes mellitus with other skin complications: Secondary | ICD-10-CM

## 2022-02-22 DIAGNOSIS — T8149XA Infection following a procedure, other surgical site, initial encounter: Secondary | ICD-10-CM

## 2022-02-22 DIAGNOSIS — E119 Type 2 diabetes mellitus without complications: Secondary | ICD-10-CM

## 2022-02-22 DIAGNOSIS — Z794 Long term (current) use of insulin: Secondary | ICD-10-CM | POA: Diagnosis not present

## 2022-02-22 MED ORDER — CEPHALEXIN 500 MG PO CAPS: 500.0000 mg | ORAL_CAPSULE | Freq: Three times a day (TID) | ORAL | 0 refills | Status: DC

## 2022-02-22 NOTE — ED Triage Notes (Signed)
Pt reports "runny pus" x 2 days, is coming out of the incision she had 5 weeks ago for a carotid artery stenosis. Reports 5 week with right ear pain. Pt reports nausea x 4 days. Pt denies fever, chills.  ? ?

## 2022-02-22 NOTE — Discharge Instructions (Addendum)
Keep your wound covered with non-stick gauze. Start Keflex as an antibiotic for a wound infection. Follow up with your surgeon as soon as possible.  ?

## 2022-02-22 NOTE — ED Provider Notes (Signed)
?[place] ? ? ?MRN: [id number] DOB: Feb 08, 1956 ? ?Subjective:  ? ?Veronica Murray is a 66 y.o. female presenting for 2-day history of acute onset worsening swelling, pain and drainage from her wound of the left side of her neck.  Patient had an endarterectomy, carotid stent intervention 01/24/2022.  She followed up the week after and at that time did not have a wound infection.  However, since then has progressed to what she has now.  No fever, chills, chest pain, shortness of breath, heart racing. ? ?No current facility-administered medications for this encounter. ? ?Current Outpatient Medications:  ?  apixaban (ELIQUIS) 5 MG TABS tablet, Take 1 tablet (5 mg total) by mouth 2 (two) times daily., Disp: 60 tablet, Rfl: 2 ?  aspirin EC 81 MG EC tablet, Take 1 tablet (81 mg total) by mouth daily. (Patient not taking: Reported on 02/01/2022), Disp: 30 tablet, Rfl: 0 ?  aspirin EC 81 MG EC tablet, Take 1 tablet (81 mg total) by mouth daily. Swallow whole., Disp: 30 tablet, Rfl: 11 ?  Ibuprofen 200 MG CAPS, Take 800 mg by mouth 2 (two) times daily., Disp: , Rfl:  ?  Insulin Pen Needle (NOVOFINE) 30G X 8 MM MISC, Inject 10 each into the skin as needed. (Patient not taking: Reported on 02/01/2022), Disp: 100 each, Rfl: 0 ?  Menthol, Topical Analgesic, (BIOFREEZE EX), Apply 1 application topically 2 (two) times daily as needed (pain). (Patient not taking: Reported on 02/01/2022), Disp: , Rfl:  ?  oxyCODONE-acetaminophen (PERCOCET/ROXICET) 5-325 MG tablet, Take 1 tablet by mouth every 6 (six) hours as needed for moderate pain. (Patient not taking: Reported on 02/01/2022), Disp: 10 tablet, Rfl: 0 ?  rosuvastatin (CRESTOR) 10 MG tablet, Take 1 tablet (10 mg total) by mouth daily., Disp: 30 tablet, Rfl: 5  ? ?Allergies  ?Allergen Reactions  ? Codeine Hives, Shortness Of Breath and Nausea Only  ? Toradol [Ketorolac Tromethamine] Hives, Shortness Of Breath and Nausea Only  ? Tramadol Hcl Shortness Of Breath and  Nausea Only  ?  "Conflicts with bipolar condition"  ? ? ?Past Medical History:  ?Diagnosis Date  ? Aortic arch atherosclerosis ([place])   ? Diabetes mellitus without complication ([place])   ? Essential hypertension   ? Fibromyalgia   ? Tobacco abuse   ?  ? ?Past Surgical History:  ?Procedure Laterality Date  ? CAROTID PTA/STENT INTERVENTION Left 01/24/2022  ? Procedure: CAROTID PTA/STENT INTERVENTION;  Surgeon: Algernon Huxley, MD;  Location: [facility];  Service: Cardiovascular;  Laterality: Left;  ? CERVICAL BIOPSY    ? CHOLECYSTECTOMY    ? ENDARTERECTOMY Left 01/25/2022  ? Procedure: ENDARTERECTOMY CAROTID, possible ligation;  Surgeon: Algernon Huxley, MD;  Location: [facility];  Service: Vascular;  Laterality: Left;  ? HERNIA REPAIR    ? LEFT HEART CATH AND CORONARY ANGIOGRAPHY Left 07/05/2019  ? Procedure: LEFT HEART CATH AND CORONARY ANGIOGRAPHY;  Surgeon: Wellington Hampshire, MD;  Location: [facility];  Service: Cardiovascular;  Laterality: Left;  ? ? ?Family History  ?Problem Relation Age of Onset  ? CAD Neg Hx   ? ? ?Social History  ? ?Tobacco Use  ? Smoking status: Every Day  ?  Packs/day: 1.50  ?  Years: 50.00  ?  Pack years: 75.00  ?  Types: Cigarettes  ? Smokeless tobacco: Never  ?Substance Use Topics  ? Alcohol use: Not Currently  ? Drug use: Yes  ?  Frequency: 7.0 times per week  ?  Types: Marijuana  ? ? ?ROS ? ? ?Objective:  ? ?Vitals: ?BP (!) 142/79 (BP Location: Right Arm)   Pulse 94   Temp 99.3 ?F (37.4 ?C) (Oral)   Resp 18   SpO2 95%  ? ?Physical Exam ?Constitutional:   ?   General: She is not in acute distress. ?   Appearance: Normal appearance. She is well-developed. She is not ill-appearing, toxic-appearing or diaphoretic.  ?HENT:  ?   Head: Normocephalic and atraumatic.  ?   Nose: Nose normal.  ?   Mouth/Throat:  ?   Mouth: Mucous membranes are moist.  ?Eyes:  ?   General: No scleral icterus.    ?   Right eye: No discharge.     ?   Left eye: No discharge.  ?   Extraocular Movements:  Extraocular movements intact.  ?Neck:  ? ?Cardiovascular:  ?   Rate and Rhythm: Normal rate.  ?   Heart sounds: No murmur heard. ?  No friction rub. No gallop.  ?Pulmonary:  ?   Effort: Pulmonary effort is normal. No respiratory distress.  ?   Breath sounds: No stridor. No wheezing, rhonchi or rales.  ?Chest:  ?   Chest wall: No tenderness.  ?Skin: ?   General: Skin is warm and dry.  ?Neurological:  ?   General: No focal deficit present.  ?   Mental Status: She is alert and oriented to person, place, and time.  ?Psychiatric:     ?   Mood and Affect: Mood normal.     ?   Behavior: Behavior normal.  ? ? ? ? ? ?Assessment and Plan :  ? ?PDMP not reviewed this encounter. ? ?1. Postoperative wound infection   ?2. Neck pain   ?3. Type 2 diabetes mellitus treated with insulin ([place])   ? ?Patient is to start Keflex 3 times daily for wound infection.  Discussed wound care.  She is to follow-up as soon as possible with her surgeon.  Will defer ER visit for now as she is hemodynamically stable. Counseled patient on potential for adverse effects with medications prescribed/recommended today, ER and return-to-clinic precautions discussed, patient verbalized understanding. ? ?  ?Jaynee Eagles, PA-C ?02/22/22 1754 ? ?

## 2022-02-24 ENCOUNTER — Inpatient Hospital Stay
Admission: EM | Admit: 2022-02-24 | Discharge: 2022-03-15 | DRG: 907 | Disposition: A | Discharge status: Skilled Nursing Facility | Payer: Medicare HMO | Attending: Internal Medicine | Admitting: Internal Medicine

## 2022-02-24 ENCOUNTER — Emergency Department (HOSPITAL_COMMUNITY)
Admission: EM | Admit: 2022-02-24 | Discharge: 2022-02-24 | Payer: Medicare HMO | Attending: Emergency Medicine | Admitting: Emergency Medicine

## 2022-02-24 DIAGNOSIS — M25559 Pain in unspecified hip: Secondary | ICD-10-CM

## 2022-02-24 DIAGNOSIS — D62 Acute posthemorrhagic anemia: Secondary | ICD-10-CM | POA: Diagnosis present

## 2022-02-24 DIAGNOSIS — J9601 Acute respiratory failure with hypoxia: Secondary | ICD-10-CM | POA: Diagnosis not present

## 2022-02-24 DIAGNOSIS — I959 Hypotension, unspecified: Secondary | ICD-10-CM | POA: Diagnosis not present

## 2022-02-24 DIAGNOSIS — F919 Conduct disorder, unspecified: Secondary | ICD-10-CM | POA: Diagnosis present

## 2022-02-24 DIAGNOSIS — I69391 Dysphagia following cerebral infarction: Secondary | ICD-10-CM

## 2022-02-24 DIAGNOSIS — K0889 Other specified disorders of teeth and supporting structures: Secondary | ICD-10-CM | POA: Diagnosis present

## 2022-02-24 DIAGNOSIS — T8141XA Infection following a procedure, superficial incisional surgical site, initial encounter: Secondary | ICD-10-CM | POA: Diagnosis not present

## 2022-02-24 DIAGNOSIS — R221 Localized swelling, mass and lump, neck: Secondary | ICD-10-CM | POA: Diagnosis not present

## 2022-02-24 DIAGNOSIS — J449 Chronic obstructive pulmonary disease, unspecified: Secondary | ICD-10-CM | POA: Diagnosis present

## 2022-02-24 DIAGNOSIS — R231 Pallor: Secondary | ICD-10-CM | POA: Diagnosis not present

## 2022-02-24 DIAGNOSIS — Z794 Long term (current) use of insulin: Secondary | ICD-10-CM | POA: Diagnosis not present

## 2022-02-24 DIAGNOSIS — R000 Tachycardia, unspecified: Secondary | ICD-10-CM | POA: Diagnosis not present

## 2022-02-24 DIAGNOSIS — Z20822 Contact with and (suspected) exposure to covid-19: Secondary | ICD-10-CM | POA: Diagnosis present

## 2022-02-24 DIAGNOSIS — E1165 Type 2 diabetes mellitus with hyperglycemia: Secondary | ICD-10-CM | POA: Diagnosis present

## 2022-02-24 DIAGNOSIS — I63511 Cerebral infarction due to unspecified occlusion or stenosis of right middle cerebral artery: Secondary | ICD-10-CM | POA: Diagnosis not present

## 2022-02-24 DIAGNOSIS — D72829 Elevated white blood cell count, unspecified: Secondary | ICD-10-CM

## 2022-02-24 DIAGNOSIS — I2510 Atherosclerotic heart disease of native coronary artery without angina pectoris: Secondary | ICD-10-CM | POA: Diagnosis present

## 2022-02-24 DIAGNOSIS — E8770 Fluid overload, unspecified: Secondary | ICD-10-CM | POA: Diagnosis not present

## 2022-02-24 DIAGNOSIS — R578 Other shock: Secondary | ICD-10-CM | POA: Diagnosis not present

## 2022-02-24 DIAGNOSIS — Z7902 Long term (current) use of antithrombotics/antiplatelets: Secondary | ICD-10-CM

## 2022-02-24 DIAGNOSIS — Z7901 Long term (current) use of anticoagulants: Secondary | ICD-10-CM | POA: Insufficient documentation

## 2022-02-24 DIAGNOSIS — E871 Hypo-osmolality and hyponatremia: Secondary | ICD-10-CM | POA: Diagnosis not present

## 2022-02-24 DIAGNOSIS — I700 Atherosclerosis of aorta: Secondary | ICD-10-CM | POA: Diagnosis present

## 2022-02-24 DIAGNOSIS — F17210 Nicotine dependence, cigarettes, uncomplicated: Secondary | ICD-10-CM | POA: Diagnosis present

## 2022-02-24 DIAGNOSIS — G9340 Encephalopathy, unspecified: Secondary | ICD-10-CM | POA: Diagnosis not present

## 2022-02-24 DIAGNOSIS — R339 Retention of urine, unspecified: Secondary | ICD-10-CM | POA: Diagnosis not present

## 2022-02-24 DIAGNOSIS — I639 Cerebral infarction, unspecified: Secondary | ICD-10-CM

## 2022-02-24 DIAGNOSIS — Z515 Encounter for palliative care: Secondary | ICD-10-CM

## 2022-02-24 DIAGNOSIS — I97618 Postprocedural hemorrhage and hematoma of a circulatory system organ or structure following other circulatory system procedure: Secondary | ICD-10-CM | POA: Diagnosis not present

## 2022-02-24 DIAGNOSIS — T8579XA Infection and inflammatory reaction due to other internal prosthetic devices, implants and grafts, initial encounter: Secondary | ICD-10-CM | POA: Diagnosis not present

## 2022-02-24 DIAGNOSIS — R61 Generalized hyperhidrosis: Secondary | ICD-10-CM | POA: Diagnosis not present

## 2022-02-24 DIAGNOSIS — Z9889 Other specified postprocedural states: Secondary | ICD-10-CM

## 2022-02-24 DIAGNOSIS — I6522 Occlusion and stenosis of left carotid artery: Secondary | ICD-10-CM | POA: Diagnosis present

## 2022-02-24 DIAGNOSIS — D649 Anemia, unspecified: Secondary | ICD-10-CM

## 2022-02-24 DIAGNOSIS — E872 Acidosis, unspecified: Secondary | ICD-10-CM | POA: Diagnosis present

## 2022-02-24 DIAGNOSIS — Z7982 Long term (current) use of aspirin: Secondary | ICD-10-CM | POA: Diagnosis not present

## 2022-02-24 DIAGNOSIS — M797 Fibromyalgia: Secondary | ICD-10-CM | POA: Diagnosis present

## 2022-02-24 DIAGNOSIS — Z6831 Body mass index (BMI) 31.0-31.9, adult: Secondary | ICD-10-CM

## 2022-02-24 DIAGNOSIS — E876 Hypokalemia: Secondary | ICD-10-CM | POA: Diagnosis not present

## 2022-02-24 DIAGNOSIS — Z9862 Peripheral vascular angioplasty status: Secondary | ICD-10-CM | POA: Diagnosis not present

## 2022-02-24 DIAGNOSIS — J9602 Acute respiratory failure with hypercapnia: Secondary | ICD-10-CM | POA: Diagnosis not present

## 2022-02-24 DIAGNOSIS — Z888 Allergy status to other drugs, medicaments and biological substances status: Secondary | ICD-10-CM

## 2022-02-24 DIAGNOSIS — Z8673 Personal history of transient ischemic attack (TIA), and cerebral infarction without residual deficits: Secondary | ICD-10-CM

## 2022-02-24 DIAGNOSIS — E119 Type 2 diabetes mellitus without complications: Secondary | ICD-10-CM

## 2022-02-24 DIAGNOSIS — I772 Rupture of artery: Secondary | ICD-10-CM

## 2022-02-24 DIAGNOSIS — Z79899 Other long term (current) drug therapy: Secondary | ICD-10-CM

## 2022-02-24 DIAGNOSIS — R1312 Dysphagia, oropharyngeal phase: Secondary | ICD-10-CM | POA: Diagnosis not present

## 2022-02-24 DIAGNOSIS — R1310 Dysphagia, unspecified: Secondary | ICD-10-CM

## 2022-02-24 DIAGNOSIS — I10 Essential (primary) hypertension: Secondary | ICD-10-CM | POA: Diagnosis not present

## 2022-02-24 DIAGNOSIS — D75839 Thrombocytosis, unspecified: Secondary | ICD-10-CM | POA: Diagnosis not present

## 2022-02-24 DIAGNOSIS — Z9049 Acquired absence of other specified parts of digestive tract: Secondary | ICD-10-CM

## 2022-02-24 DIAGNOSIS — Z885 Allergy status to narcotic agent status: Secondary | ICD-10-CM

## 2022-02-24 LAB — COMPREHENSIVE METABOLIC PANEL
ALT: 17 U/L (ref 0–44)
AST: 19 U/L (ref 15–41)
Albumin: 2.6 g/dL — ABNORMAL LOW (ref 3.5–5.0)
Alkaline Phosphatase: 146 U/L — ABNORMAL HIGH (ref 38–126)
Anion gap: 13 (ref 5–15)
BUN: 17 mg/dL (ref 8–23)
CO2: 21 mmol/L — ABNORMAL LOW (ref 22–32)
Calcium: 8.7 mg/dL — ABNORMAL LOW (ref 8.9–10.3)
Chloride: 99 mmol/L (ref 98–111)
Creatinine, Ser: 0.82 mg/dL (ref 0.44–1.00)
GFR, Estimated: >60 mL/min (ref >60)
Glucose, Bld: 444 mg/dL — ABNORMAL HIGH (ref 70–99)
Potassium: 4.2 mmol/L (ref 3.5–5.1)
Sodium: 133 mmol/L — ABNORMAL LOW (ref 135–145)
Total Bilirubin: 0.1 mg/dL — ABNORMAL LOW (ref 0.3–1.2)
Total Protein: 6.4 g/dL — ABNORMAL LOW (ref 6.5–8.1)

## 2022-02-24 LAB — CBC WITH DIFFERENTIAL/PLATELET
Abs Immature Granulocytes: 0.01 10*3/uL (ref 0.00–0.07)
Basophils Absolute: 0 10*3/uL (ref 0.0–0.1)
Basophils Relative: 0 %
Eosinophils Absolute: 0.1 10*3/uL (ref 0.0–0.5)
Eosinophils Relative: 2 %
HCT: 27 % — ABNORMAL LOW (ref 36.0–46.0)
Hemoglobin: 9.2 g/dL — ABNORMAL LOW (ref 12.0–15.0)
Immature Granulocytes: 0 %
Lymphocytes Relative: 63 %
Lymphs Abs: 4.6 10*3/uL — ABNORMAL HIGH (ref 0.7–4.0)
MCH: 30.7 pg (ref 26.0–34.0)
MCHC: 34.1 g/dL (ref 30.0–36.0)
MCV: 90 fL (ref 80.0–100.0)
Monocytes Absolute: 0.5 10*3/uL (ref 0.1–1.0)
Monocytes Relative: 7 %
Neutro Abs: 2.1 10*3/uL (ref 1.7–7.7)
Neutrophils Relative %: 28 %
Platelets: 380 10*3/uL (ref 150–400)
RBC: 3 MIL/uL — ABNORMAL LOW (ref 3.87–5.11)
RDW: 12.5 % (ref 11.5–15.5)
WBC: 7.4 10*3/uL (ref 4.0–10.5)
nRBC: 0 % (ref 0.0–0.2)

## 2022-02-24 LAB — PROTIME-INR
INR: 1 (ref 0.8–1.2)
Prothrombin Time: 13.4 seconds (ref 11.4–15.2)

## 2022-02-24 LAB — LACTIC ACID, PLASMA: Lactic Acid, Venous: >9 mmol/L (ref 0.5–1.9)

## 2022-02-24 LAB — PREPARE RBC (CROSSMATCH)

## 2022-02-24 LAB — I-STAT CHEM 8, ED
BUN: 15 mg/dL (ref 8–23)
Calcium, Ion: 1.15 mmol/L (ref 1.15–1.40)
Chloride: 98 mmol/L (ref 98–111)
Creatinine, Ser: 0.6 mg/dL (ref 0.44–1.00)
Glucose, Bld: 441 mg/dL — ABNORMAL HIGH (ref 70–99)
HCT: 26 % — ABNORMAL LOW (ref 36.0–46.0)
Hemoglobin: 8.8 g/dL — ABNORMAL LOW (ref 12.0–15.0)
Potassium: 4.2 mmol/L (ref 3.5–5.1)
Sodium: 132 mmol/L — ABNORMAL LOW (ref 135–145)
TCO2: 22 mmol/L (ref 22–32)

## 2022-02-24 MED ORDER — SODIUM CHLORIDE 0.9 % IV SOLN: 10.0000 mL/h | Freq: Once | INTRAVENOUS | Status: DC

## 2022-02-24 MED ORDER — SODIUM CHLORIDE 0.9 % IV BOLUS
1000.0000 mL | Freq: Once | INTRAVENOUS | Status: AC
  Administered 2022-02-24: 1000 mL via INTRAVENOUS

## 2022-02-24 MED ORDER — LACTATED RINGERS IV BOLUS
2000.0000 mL | Freq: Once | INTRAVENOUS | Status: AC
  Administered 2022-02-24: 2000 mL via INTRAVENOUS

## 2022-02-24 NOTE — ED Notes (Signed)
Pt arrived via [facility]. Pt had left carotid surgery 5 weeks ago, seen 2 days ago for possible infection. Pt had spontaneous bleeding tonight. Bleeding controlled with pressure on arrival, pressure continued. Pt gray in color, very weak, unable to speak more than a word at a time.  ?

## 2022-02-24 NOTE — ED Notes (Signed)
Veronica Murray with [facility] given report by Alinda Money, RN ?

## 2022-02-24 NOTE — ED Notes (Signed)
Dr Scot Dock paged to Dr Alvino Chapel @ [phone] ?

## 2022-02-24 NOTE — ED Notes (Signed)
Attempted to call patient's family, unable to get in touch with anyone. Pt cannot remember phone numbers. Pt c/o pain in neck, reminded pt to not move neck. ? ?

## 2022-02-24 NOTE — ED Notes (Signed)
Pt bleeding from left neck again, Dr. Rubin Payor to bedside. Pressure placed on left neck ?

## 2022-02-24 NOTE — ED Notes (Signed)
Remained at bedside throughout stay to hold manual pressure on left carotid.  Pt A&Ox4 on discharge.  [facility] maintained pressure on discharge. Pt transferred to [place] via [facility].   ?

## 2022-02-24 NOTE — ED Provider Notes (Signed)
?[facility] EMERGENCY DEPARTMENT ?Provider Note ? ? ?CSN: [id number] ?Arrival date & time: 02/24/22  2206 ? ?  ? ?History ? ?Chief Complaint  ?Patient presents with  ? Bleeding/Bruising  ?  Bleeding from left carotid  ? ? ?Veronica Murray is a 66 y.o. female. ? ?HPI ?Patient brought in emergent traffic by EMS.  Around 1 month ago had left-sided carotid endarterectomy by Dr. Wyn Quakerew at [facility].  Seen 2 days ago at urgent care for possible infection.  Started on antibiotics.  Tonight the wound burst open.  Large amount of bleeding.  Hypotensive for EMS.  No numbness or weakness.  Patient states she is on only aspirin but reviewing records it appears as if she is on Eliquis.  Pressure in the 70s upon arrival. ?  ? ?Home Medications ?Prior to Admission medications   ?Medication Sig Start Date End Date Taking? Authorizing Provider  ?apixaban (ELIQUIS) 5 MG TABS tablet Take 1 tablet (5 mg total) by mouth 2 (two) times daily. 01/24/22   Annice Needyew, Jason S, MD  ?aspirin EC 81 MG EC tablet Take 1 tablet (81 mg total) by mouth daily. ?Patient not taking: Reported on 02/01/2022 06/27/19   Milagros LollSudini, Srikar, MD  ?aspirin EC 81 MG EC tablet Take 1 tablet (81 mg total) by mouth daily. Swallow whole. 01/27/22   Esco, Morton PetersMiechia A, MD  ?cephALEXin (KEFLEX) 500 MG capsule Take 1 capsule (500 mg total) by mouth 3 (three) times daily. 02/22/22   Wallis BambergMani, Mario, PA-C  ?Ibuprofen 200 MG CAPS Take 800 mg by mouth 2 (two) times daily. 12/17/21   [provider]  ?Insulin Pen Needle (NOVOFINE) 30G X 8 MM MISC Inject 10 each into the skin as needed. ?Patient not taking: Reported on 02/01/2022 06/26/19   Milagros LollSudini, Srikar, MD  ?Menthol, Topical Analgesic, (BIOFREEZE EX) Apply 1 application topically 2 (two) times daily as needed (pain). ?Patient not taking: Reported on 02/01/2022    [provider]  ?oxyCODONE-acetaminophen (PERCOCET/ROXICET) 5-325 MG tablet Take 1 tablet by mouth every 6 (six) hours as needed for moderate pain. ?Patient not  taking: Reported on 02/01/2022 01/26/22   Bertram DenverEsco, Miechia A, MD  ?rosuvastatin (CRESTOR) 10 MG tablet Take 1 tablet (10 mg total) by mouth daily. 01/18/22   Annice Needyew, Jason S, MD  ?   ? ?Allergies    ?Codeine, Toradol [ketorolac tromethamine], and Tramadol hcl   ? ?Review of Systems   ?Review of Systems  ?Neurological:  Positive for light-headedness. Negative for weakness and numbness.  ?Hematological:  Bruises/bleeds easily.  ? ?Physical Exam ?Updated Vital Signs ?BP 125/60   Pulse 72   Temp (!) 97.3 ?F (36.3 ?C) (Oral)   Resp 14   Ht 5\' 2"  (1.575 m)   Wt 77.1 kg   SpO2 95%   BMI 31.09 kg/m?  ?Physical Exam ?Vitals and nursing note reviewed.  ?Constitutional:   ?   Appearance: She is diaphoretic.  ?HENT:  ?   Head: Normocephalic.  ?Neck:  ?   Comments: Swelling of left neck.  There is approximately half centimeter wound that initially was not bleeding upon arrival.  Does have pulsatile carotid but mass around the area not really pulsatile. ?Cardiovascular:  ?   Rate and Rhythm: Tachycardia present.  ?Abdominal:  ?   Tenderness: There is no abdominal tenderness.  ?Skin: ?   Coloration: Skin is pale.  ?Neurological:  ?   Mental Status: She is alert.  ? ? ?ED Results / Procedures / Treatments   ?  Labs ?(all labs ordered are listed, but only abnormal results are displayed) ?Labs Reviewed  ?CBC WITH DIFFERENTIAL/PLATELET - Abnormal; Notable for the following components:  ?    Result Value  ? RBC 3.00 (*)   ? Hemoglobin 9.2 (*)   ? HCT 27.0 (*)   ? Lymphs Abs 4.6 (*)   ? All other components within normal limits  ?COMPREHENSIVE METABOLIC PANEL - Abnormal; Notable for the following components:  ? Sodium 133 (*)   ? CO2 21 (*)   ? Glucose, Bld 444 (*)   ? Calcium 8.7 (*)   ? Total Protein 6.4 (*)   ? Albumin 2.6 (*)   ? Alkaline Phosphatase 146 (*)   ? Total Bilirubin 0.1 (*)   ? All other components within normal limits  ?LACTIC ACID, PLASMA - Abnormal; Notable for the following components:  ? Lactic Acid, Venous >9.0 (*)   ?  All other components within normal limits  ?I-STAT CHEM 8, ED - Abnormal; Notable for the following components:  ? Sodium 132 (*)   ? Glucose, Bld 441 (*)   ? Hemoglobin 8.8 (*)   ? HCT 26.0 (*)   ? All other components within normal limits  ?PROTIME-INR  ?LACTIC ACID, PLASMA  ?PREPARE RBC (CROSSMATCH)  ?PREPARE RBC (CROSSMATCH)  ?TYPE AND SCREEN  ? ? ?EKG ?None ? ?Radiology ?No results found. ? ?Procedures ?Procedures  ? ? ?Medications Ordered in ED ?Medications  ?0.9 %  sodium chloride infusion (10 mL/hr Intravenous Not Given 02/24/22 2336)  ?0.9 %  sodium chloride infusion (10 mL/hr Intravenous Not Given 02/24/22 2336)  ?0.9 %  sodium chloride infusion (10 mL/hr Intravenous Not Given 02/24/22 2336)  ?lactated ringers bolus 2,000 mL (0 mLs Intravenous Stopped 02/24/22 2323)  ?sodium chloride 0.9 % bolus 1,000 mL (0 mLs Intravenous Stopped 02/24/22 2300)  ? ? ?ED Course/ Medical Decision Making/ A&P ?  ?                        ?Medical Decision Making ?Amount and/or Complexity of Data Reviewed ?Labs: ordered. ? ?Risk ?Prescription drug management. ? ? ?Patient presents after active bleeding from recent carotid endarterectomy.  Initially had large amount of blood all over her.  Hypotensive on arrival and given O- blood.  Improvement of blood pressure to normal.  Initial hemoglobin is low but will likely adjust down more.  Had been given 2 units initially will add third before transfer.  Patient will require transfer to a surgical center.  Initially discussed with Dr. Edilia Bo at [facility].  Patient had been more stable and that the bleeding had stopped even though general pressure was being held.  He believes the patient should be transferred to [facility] to see the surgeons that were responsible for the operation initially.  Discussed with Dr. Evie Lacks at [facility].  Initially thought patient to be transferred to the ER but then became more unstable with more active bleeding.  However bleeding was able to be  controlled with 1 finger over the carotid wound.  She thought patient should go to [facility] due to instability.  Discussed again with Dr. Edilia Bo who believe the patient should go to [place] [facility] and he discussed with Dr. Evie Lacks who did accept the transfer in an ER to ER fashion.  I also discussed with the ER attending and updated him on the condition.  No numbness or weakness of the right side.  Awake and appropriate.  Feels better once her blood pressure had improved. ? ?CRITICAL CARE ?Performed by: Benjiman Core ?Total critical care time: 60 minutes ?Critical care time was exclusive of separately billable procedures and treating other patients. ?Critical care was necessary to treat or prevent imminent or life-threatening deterioration. ?Critical care was time spent personally by me on the following activities: development of treatment plan with patient and/or surrogate as well as nursing, discussions with consultants, evaluation of patient's response to treatment, examination of patient, obtaining history from patient or surrogate, ordering and performing treatments and interventions, ordering and review of laboratory studies, ordering and review of radiographic studies, pulse oximetry and re-evaluation of patient's condition. ? ? ? ? ? ? ? ? ?Final Clinical Impression(s) / ED Diagnoses ?Final diagnoses:  ?Status post carotid endarterectomy  ?Acute blood loss anemia  ? ? ?Rx / DC Orders ?ED Discharge Orders   ? ? None  ? ?  ? ? ?  ?Benjiman Core, MD ?02/24/22 2352 ? ?

## 2022-02-25 ENCOUNTER — Inpatient Hospital Stay: Payer: Medicare HMO

## 2022-02-25 ENCOUNTER — Emergency Department: Payer: Medicare HMO | Admitting: Certified Registered"

## 2022-02-25 ENCOUNTER — Encounter: Payer: Self-pay | Admitting: Vascular Surgery

## 2022-02-25 ENCOUNTER — Encounter
Admission: EM | Disposition: A | Discharge status: Skilled Nursing Facility | Payer: Self-pay | Source: Home / Self Care | Attending: Obstetrics and Gynecology

## 2022-02-25 ENCOUNTER — Other Ambulatory Visit: Payer: Self-pay

## 2022-02-25 DIAGNOSIS — I639 Cerebral infarction, unspecified: Secondary | ICD-10-CM | POA: Diagnosis not present

## 2022-02-25 DIAGNOSIS — R578 Other shock: Secondary | ICD-10-CM | POA: Diagnosis present

## 2022-02-25 DIAGNOSIS — Z9889 Other specified postprocedural states: Secondary | ICD-10-CM

## 2022-02-25 DIAGNOSIS — J9601 Acute respiratory failure with hypoxia: Secondary | ICD-10-CM | POA: Diagnosis not present

## 2022-02-25 DIAGNOSIS — J9622 Acute and chronic respiratory failure with hypercapnia: Secondary | ICD-10-CM | POA: Diagnosis not present

## 2022-02-25 DIAGNOSIS — I959 Hypotension, unspecified: Secondary | ICD-10-CM | POA: Diagnosis present

## 2022-02-25 DIAGNOSIS — Z20822 Contact with and (suspected) exposure to covid-19: Secondary | ICD-10-CM | POA: Diagnosis present

## 2022-02-25 DIAGNOSIS — I63511 Cerebral infarction due to unspecified occlusion or stenosis of right middle cerebral artery: Secondary | ICD-10-CM | POA: Diagnosis not present

## 2022-02-25 DIAGNOSIS — I772 Rupture of artery: Secondary | ICD-10-CM

## 2022-02-25 DIAGNOSIS — I6522 Occlusion and stenosis of left carotid artery: Secondary | ICD-10-CM | POA: Diagnosis present

## 2022-02-25 DIAGNOSIS — E872 Acidosis, unspecified: Secondary | ICD-10-CM | POA: Diagnosis present

## 2022-02-25 DIAGNOSIS — T827XXA Infection and inflammatory reaction due to other cardiac and vascular devices, implants and grafts, initial encounter: Secondary | ICD-10-CM | POA: Diagnosis not present

## 2022-02-25 DIAGNOSIS — Z6831 Body mass index (BMI) 31.0-31.9, adult: Secondary | ICD-10-CM | POA: Diagnosis not present

## 2022-02-25 DIAGNOSIS — J449 Chronic obstructive pulmonary disease, unspecified: Secondary | ICD-10-CM | POA: Diagnosis present

## 2022-02-25 DIAGNOSIS — F17210 Nicotine dependence, cigarettes, uncomplicated: Secondary | ICD-10-CM | POA: Diagnosis present

## 2022-02-25 DIAGNOSIS — I69391 Dysphagia following cerebral infarction: Secondary | ICD-10-CM | POA: Diagnosis not present

## 2022-02-25 DIAGNOSIS — Z515 Encounter for palliative care: Secondary | ICD-10-CM | POA: Diagnosis not present

## 2022-02-25 DIAGNOSIS — E871 Hypo-osmolality and hyponatremia: Secondary | ICD-10-CM | POA: Diagnosis present

## 2022-02-25 DIAGNOSIS — J9602 Acute respiratory failure with hypercapnia: Secondary | ICD-10-CM | POA: Diagnosis not present

## 2022-02-25 DIAGNOSIS — G9340 Encephalopathy, unspecified: Secondary | ICD-10-CM | POA: Diagnosis not present

## 2022-02-25 DIAGNOSIS — R571 Hypovolemic shock: Secondary | ICD-10-CM

## 2022-02-25 DIAGNOSIS — E876 Hypokalemia: Secondary | ICD-10-CM | POA: Diagnosis not present

## 2022-02-25 DIAGNOSIS — D62 Acute posthemorrhagic anemia: Secondary | ICD-10-CM | POA: Diagnosis present

## 2022-02-25 DIAGNOSIS — I97618 Postprocedural hemorrhage and hematoma of a circulatory system organ or structure following other circulatory system procedure: Secondary | ICD-10-CM | POA: Diagnosis present

## 2022-02-25 DIAGNOSIS — I10 Essential (primary) hypertension: Secondary | ICD-10-CM | POA: Diagnosis present

## 2022-02-25 DIAGNOSIS — J9621 Acute and chronic respiratory failure with hypoxia: Secondary | ICD-10-CM | POA: Diagnosis not present

## 2022-02-25 DIAGNOSIS — I700 Atherosclerosis of aorta: Secondary | ICD-10-CM | POA: Diagnosis present

## 2022-02-25 DIAGNOSIS — I6389 Other cerebral infarction: Secondary | ICD-10-CM | POA: Diagnosis not present

## 2022-02-25 DIAGNOSIS — T8141XA Infection following a procedure, superficial incisional surgical site, initial encounter: Secondary | ICD-10-CM | POA: Diagnosis present

## 2022-02-25 DIAGNOSIS — E1165 Type 2 diabetes mellitus with hyperglycemia: Secondary | ICD-10-CM | POA: Diagnosis present

## 2022-02-25 DIAGNOSIS — D75839 Thrombocytosis, unspecified: Secondary | ICD-10-CM | POA: Diagnosis present

## 2022-02-25 DIAGNOSIS — T8579XA Infection and inflammatory reaction due to other internal prosthetic devices, implants and grafts, initial encounter: Secondary | ICD-10-CM | POA: Diagnosis present

## 2022-02-25 HISTORY — PX: ENDARTERECTOMY: SHX5162

## 2022-02-25 LAB — COMPREHENSIVE METABOLIC PANEL
ALT: 10 U/L (ref 0–44)
AST: 13 U/L — ABNORMAL LOW (ref 15–41)
Albumin: 2.3 g/dL — ABNORMAL LOW (ref 3.5–5.0)
Alkaline Phosphatase: 103 U/L (ref 38–126)
Anion gap: 8 (ref 5–15)
BUN: 15 mg/dL (ref 8–23)
CO2: 26 mmol/L (ref 22–32)
Calcium: 7.8 mg/dL — ABNORMAL LOW (ref 8.9–10.3)
Chloride: 98 mmol/L (ref 98–111)
Creatinine, Ser: 0.58 mg/dL (ref 0.44–1.00)
GFR, Estimated: >60 mL/min (ref >60)
Glucose, Bld: 490 mg/dL — ABNORMAL HIGH (ref 70–99)
Potassium: 5.2 mmol/L — ABNORMAL HIGH (ref 3.5–5.1)
Sodium: 132 mmol/L — ABNORMAL LOW (ref 135–145)
Total Bilirubin: 0.4 mg/dL (ref 0.3–1.2)
Total Protein: 5.2 g/dL — ABNORMAL LOW (ref 6.5–8.1)

## 2022-02-25 LAB — CBC WITH DIFFERENTIAL/PLATELET
Abs Immature Granulocytes: 0.13 10*3/uL — ABNORMAL HIGH (ref 0.00–0.07)
Basophils Absolute: 0 10*3/uL (ref 0.0–0.1)
Basophils Relative: 0 %
Eosinophils Absolute: 0 10*3/uL (ref 0.0–0.5)
Eosinophils Relative: 0 %
HCT: 34 % — ABNORMAL LOW (ref 36.0–46.0)
Hemoglobin: 11.5 g/dL — ABNORMAL LOW (ref 12.0–15.0)
Immature Granulocytes: 1 %
Lymphocytes Relative: 18 %
Lymphs Abs: 1.9 10*3/uL (ref 0.7–4.0)
MCH: 29.6 pg (ref 26.0–34.0)
MCHC: 33.8 g/dL (ref 30.0–36.0)
MCV: 87.6 fL (ref 80.0–100.0)
Monocytes Absolute: 0.4 10*3/uL (ref 0.1–1.0)
Monocytes Relative: 4 %
Neutro Abs: 8.1 10*3/uL — ABNORMAL HIGH (ref 1.7–7.7)
Neutrophils Relative %: 77 %
Platelets: 238 10*3/uL (ref 150–400)
RBC: 3.88 MIL/uL (ref 3.87–5.11)
RDW: 13.4 % (ref 11.5–15.5)
WBC: 10.5 10*3/uL (ref 4.0–10.5)
nRBC: 0 % (ref 0.0–0.2)

## 2022-02-25 LAB — BPAM FFP
Blood Product Expiration Date: 202304222359
Blood Product Expiration Date: 202304222359
Blood Product Expiration Date: 202304222359
Blood Product Expiration Date: 202304222359
ISSUE DATE / TIME: 202304170115
ISSUE DATE / TIME: 202304170115
ISSUE DATE / TIME: 202304170115
ISSUE DATE / TIME: 202304170115
Unit Type and Rh: 5100
Unit Type and Rh: 5100
Unit Type and Rh: 5100
Unit Type and Rh: 5100

## 2022-02-25 LAB — GLUCOSE, CAPILLARY
Glucose-Capillary: 383 mg/dL — ABNORMAL HIGH (ref 70–99)
Glucose-Capillary: 306 mg/dL — ABNORMAL HIGH (ref 70–99)
Glucose-Capillary: 412 mg/dL — ABNORMAL HIGH (ref 70–99)
Glucose-Capillary: 385 mg/dL — ABNORMAL HIGH (ref 70–99)
Glucose-Capillary: 346 mg/dL — ABNORMAL HIGH (ref 70–99)
Glucose-Capillary: 303 mg/dL — ABNORMAL HIGH (ref 70–99)
Glucose-Capillary: 248 mg/dL — ABNORMAL HIGH (ref 70–99)
Glucose-Capillary: 254 mg/dL — ABNORMAL HIGH (ref 70–99)
Glucose-Capillary: 230 mg/dL — ABNORMAL HIGH (ref 70–99)
Glucose-Capillary: 190 mg/dL — ABNORMAL HIGH (ref 70–99)
Glucose-Capillary: 147 mg/dL — ABNORMAL HIGH (ref 70–99)
Glucose-Capillary: 141 mg/dL — ABNORMAL HIGH (ref 70–99)
Glucose-Capillary: 121 mg/dL — ABNORMAL HIGH (ref 70–99)
Glucose-Capillary: 110 mg/dL — ABNORMAL HIGH (ref 70–99)
Glucose-Capillary: 109 mg/dL — ABNORMAL HIGH (ref 70–99)
Glucose-Capillary: 147 mg/dL — ABNORMAL HIGH (ref 70–99)

## 2022-02-25 LAB — BLOOD GAS, ARTERIAL
Acid-base deficit: 0.4 mmol/L (ref 0.0–2.0)
Bicarbonate: 24.8 mmol/L (ref 20.0–28.0)
O2 Saturation: 99.8 %
Patient temperature: 37
pCO2 arterial: 42 mmHg (ref 32–48)
pH, Arterial: 7.38 (ref 7.35–7.45)
pO2, Arterial: 258 mmHg — ABNORMAL HIGH (ref 83–108)

## 2022-02-25 LAB — TYPE AND SCREEN
ABO/RH(D): O POS
Antibody Screen: NEGATIVE
Unit division: 0
Unit division: 0
Unit division: 0

## 2022-02-25 LAB — MRSA NEXT GEN BY PCR, NASAL: MRSA by PCR Next Gen: NOT DETECTED

## 2022-02-25 LAB — URINALYSIS, COMPLETE (UACMP) WITH MICROSCOPIC
Bacteria, UA: NONE SEEN
Bilirubin Urine: NEGATIVE
Glucose, UA: >=500 mg/dL — AB
Hgb urine dipstick: NEGATIVE
Ketones, ur: NEGATIVE mg/dL
Leukocytes,Ua: NEGATIVE
Nitrite: NEGATIVE
Protein, ur: NEGATIVE mg/dL
Specific Gravity, Urine: 1.021 (ref 1.005–1.030)
pH: 5 (ref 5.0–8.0)

## 2022-02-25 LAB — PREPARE FRESH FROZEN PLASMA

## 2022-02-25 LAB — BASIC METABOLIC PANEL WITH GFR
Anion gap: 7 (ref 5–15)
BUN: 16 mg/dL (ref 8–23)
CO2: 22 mmol/L (ref 22–32)
Calcium: 7.6 mg/dL — ABNORMAL LOW (ref 8.9–10.3)
Chloride: 103 mmol/L (ref 98–111)
Creatinine, Ser: 0.74 mg/dL (ref 0.44–1.00)
GFR, Estimated: >60 mL/min
Glucose, Bld: 284 mg/dL — ABNORMAL HIGH (ref 70–99)
Potassium: 3.8 mmol/L (ref 3.5–5.1)
Sodium: 132 mmol/L — ABNORMAL LOW (ref 135–145)

## 2022-02-25 LAB — BPAM RBC
Blood Product Expiration Date: 202305182359
Blood Product Expiration Date: 202305192359
Blood Product Expiration Date: 202305242359
ISSUE DATE / TIME: 202304162215
ISSUE DATE / TIME: 202304162215
ISSUE DATE / TIME: 202304162312
Unit Type and Rh: 5100
Unit Type and Rh: 9500
Unit Type and Rh: 9500

## 2022-02-25 LAB — PREPARE PLATELET PHERESIS: Unit division: 0

## 2022-02-25 LAB — BASIC METABOLIC PANEL
Anion gap: 8 (ref 5–15)
BUN: 17 mg/dL (ref 8–23)
CO2: 26 mmol/L (ref 22–32)
Calcium: 8.2 mg/dL — ABNORMAL LOW (ref 8.9–10.3)
Chloride: 102 mmol/L (ref 98–111)
Creatinine, Ser: 0.67 mg/dL (ref 0.44–1.00)
GFR, Estimated: >60 mL/min (ref >60)
Glucose, Bld: 114 mg/dL — ABNORMAL HIGH (ref 70–99)
Potassium: 4 mmol/L (ref 3.5–5.1)
Sodium: 136 mmol/L (ref 135–145)

## 2022-02-25 LAB — HEMOGLOBIN A1C
Hgb A1c MFr Bld: 8.5 % — ABNORMAL HIGH (ref 4.8–5.6)
Mean Plasma Glucose: 197.25 mg/dL

## 2022-02-25 LAB — MAGNESIUM
Magnesium: 1.4 mg/dL — ABNORMAL LOW (ref 1.7–2.4)
Magnesium: 2.4 mg/dL (ref 1.7–2.4)

## 2022-02-25 LAB — BPAM PLATELET PHERESIS
Blood Product Expiration Date: 202304172359
ISSUE DATE / TIME: 202304162350
Unit Type and Rh: 5100

## 2022-02-25 LAB — PROTIME-INR
INR: 1.1 (ref 0.8–1.2)
Prothrombin Time: 13.9 seconds (ref 11.4–15.2)

## 2022-02-25 LAB — LACTIC ACID, PLASMA: Lactic Acid, Venous: 0.8 mmol/L (ref 0.5–1.9)

## 2022-02-25 LAB — HEMOGLOBIN AND HEMATOCRIT, BLOOD
HCT: 30.9 % — ABNORMAL LOW (ref 36.0–46.0)
Hemoglobin: 10.3 g/dL — ABNORMAL LOW (ref 12.0–15.0)

## 2022-02-25 LAB — PREPARE RBC (CROSSMATCH)

## 2022-02-25 LAB — PROCALCITONIN: Procalcitonin: <0.1 ng/mL

## 2022-02-25 LAB — PHOSPHORUS: Phosphorus: 4.5 mg/dL (ref 2.5–4.6)

## 2022-02-25 IMAGING — DX DG CHEST 1V PORT
1 series · 1 of 1 positions shown · non-contrast
Comparison: CT chest abdomen and pelvis [DATE] and earlier.

CLINICAL DATA: 65-year-old female intubated. Postoperative day zero
left neck exploration, repair of left carotid endarterectomy patch
blowout.

EXAM:
PORTABLE CHEST 1 VIEW

[chest ap]
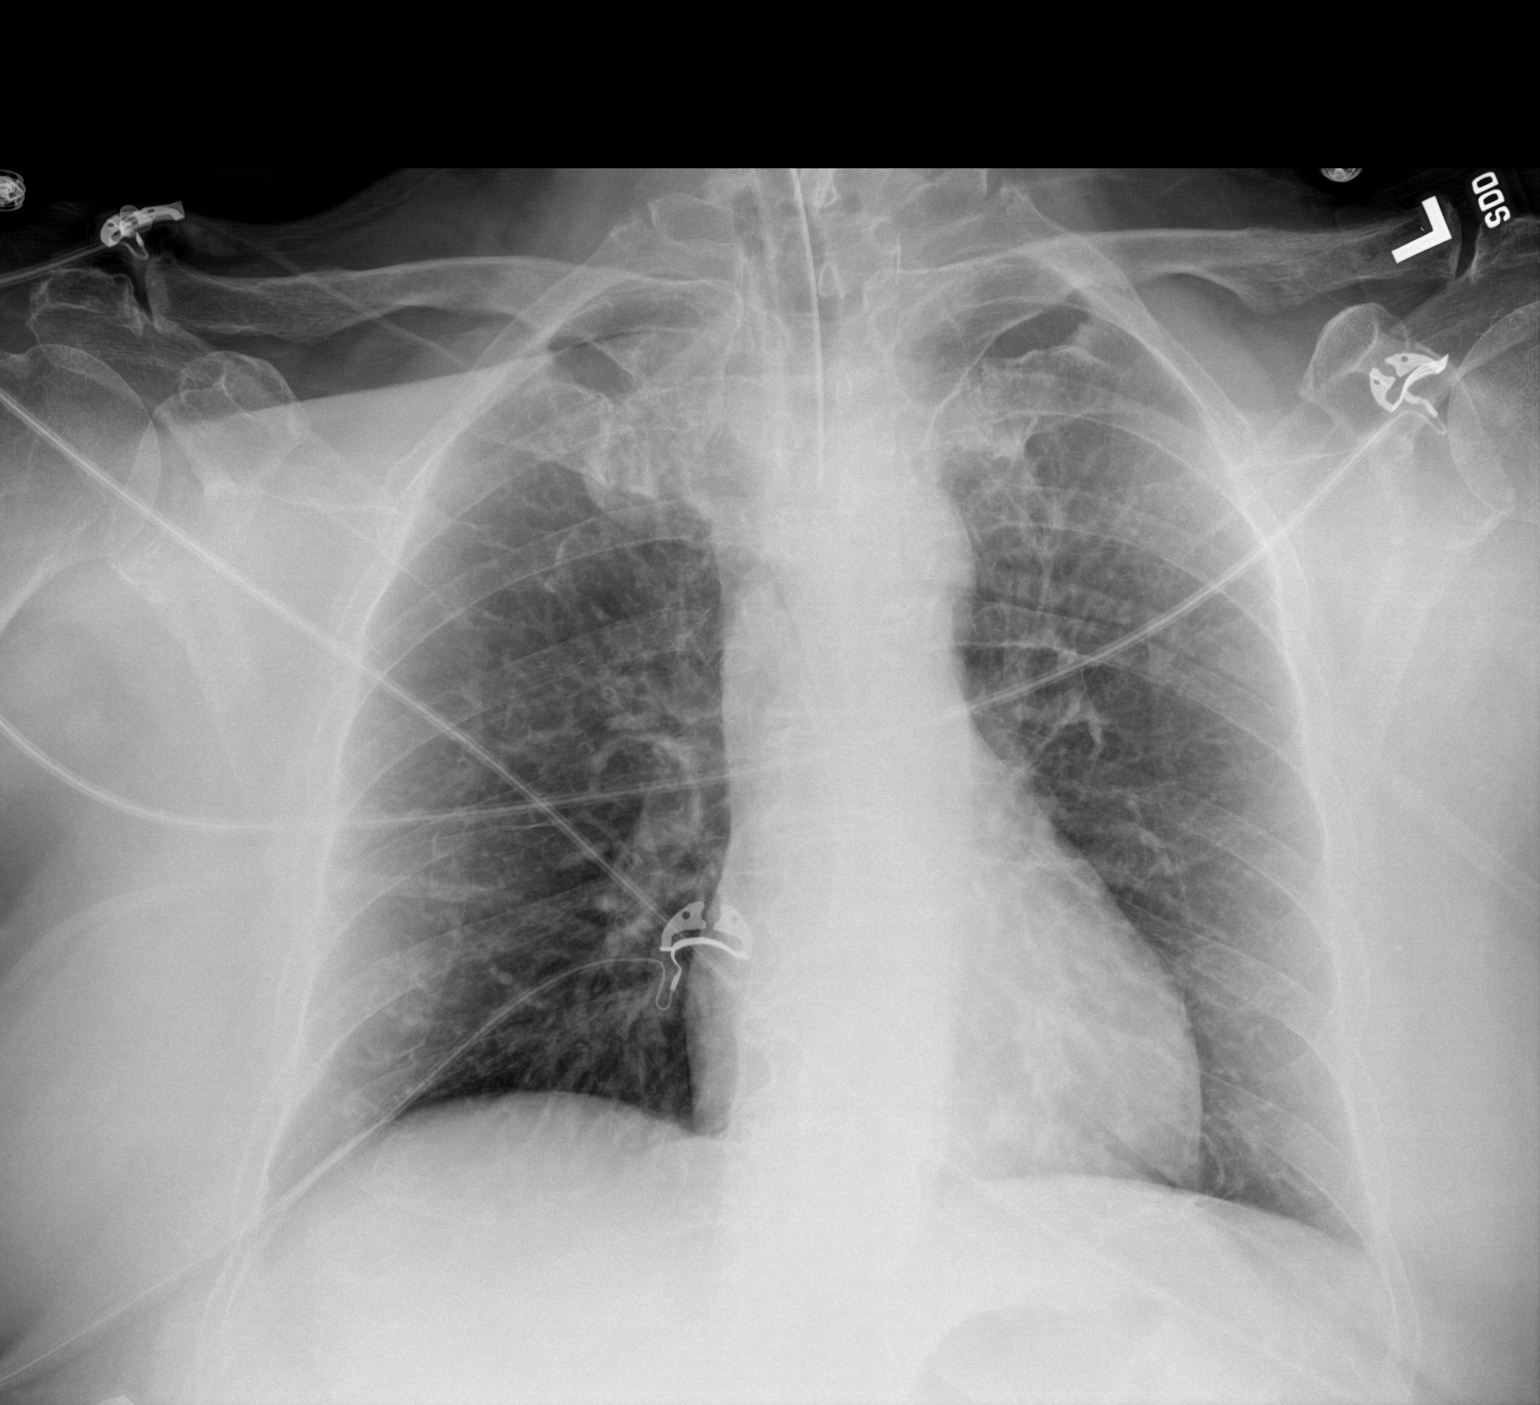

[1 of 1 positions shown; findings below may reference images not displayed]

FINDINGS: Portable AP upright view at [ZI] hours. Endotracheal tube tip in
good position between the level the clavicles and carina. Surgical
drain and/or graft projects in the left neck. Normal cardiac size
and mediastinal contours. Stable chronically somewhat large lung
volumes. Allowing for portable technique the lungs are clear. No
pneumothorax. Negative visible bowel gas. No acute osseous
abnormality identified.
IMPRESSION: 1. Satisfactory endotracheal tube. No acute cardiopulmonary
abnormality.
2. Surgical drain and/or graft projects in the left neck.

## 2022-02-25 IMAGING — DX DG ABD PORTABLE 1V
2 series · 2 of 2 positions shown · non-contrast
Comparison: None.

CLINICAL DATA: Nasogastric tube placement.

EXAM:
PORTABLE ABDOMEN - 1 VIEW

[abdomen supine (1 of 2)]
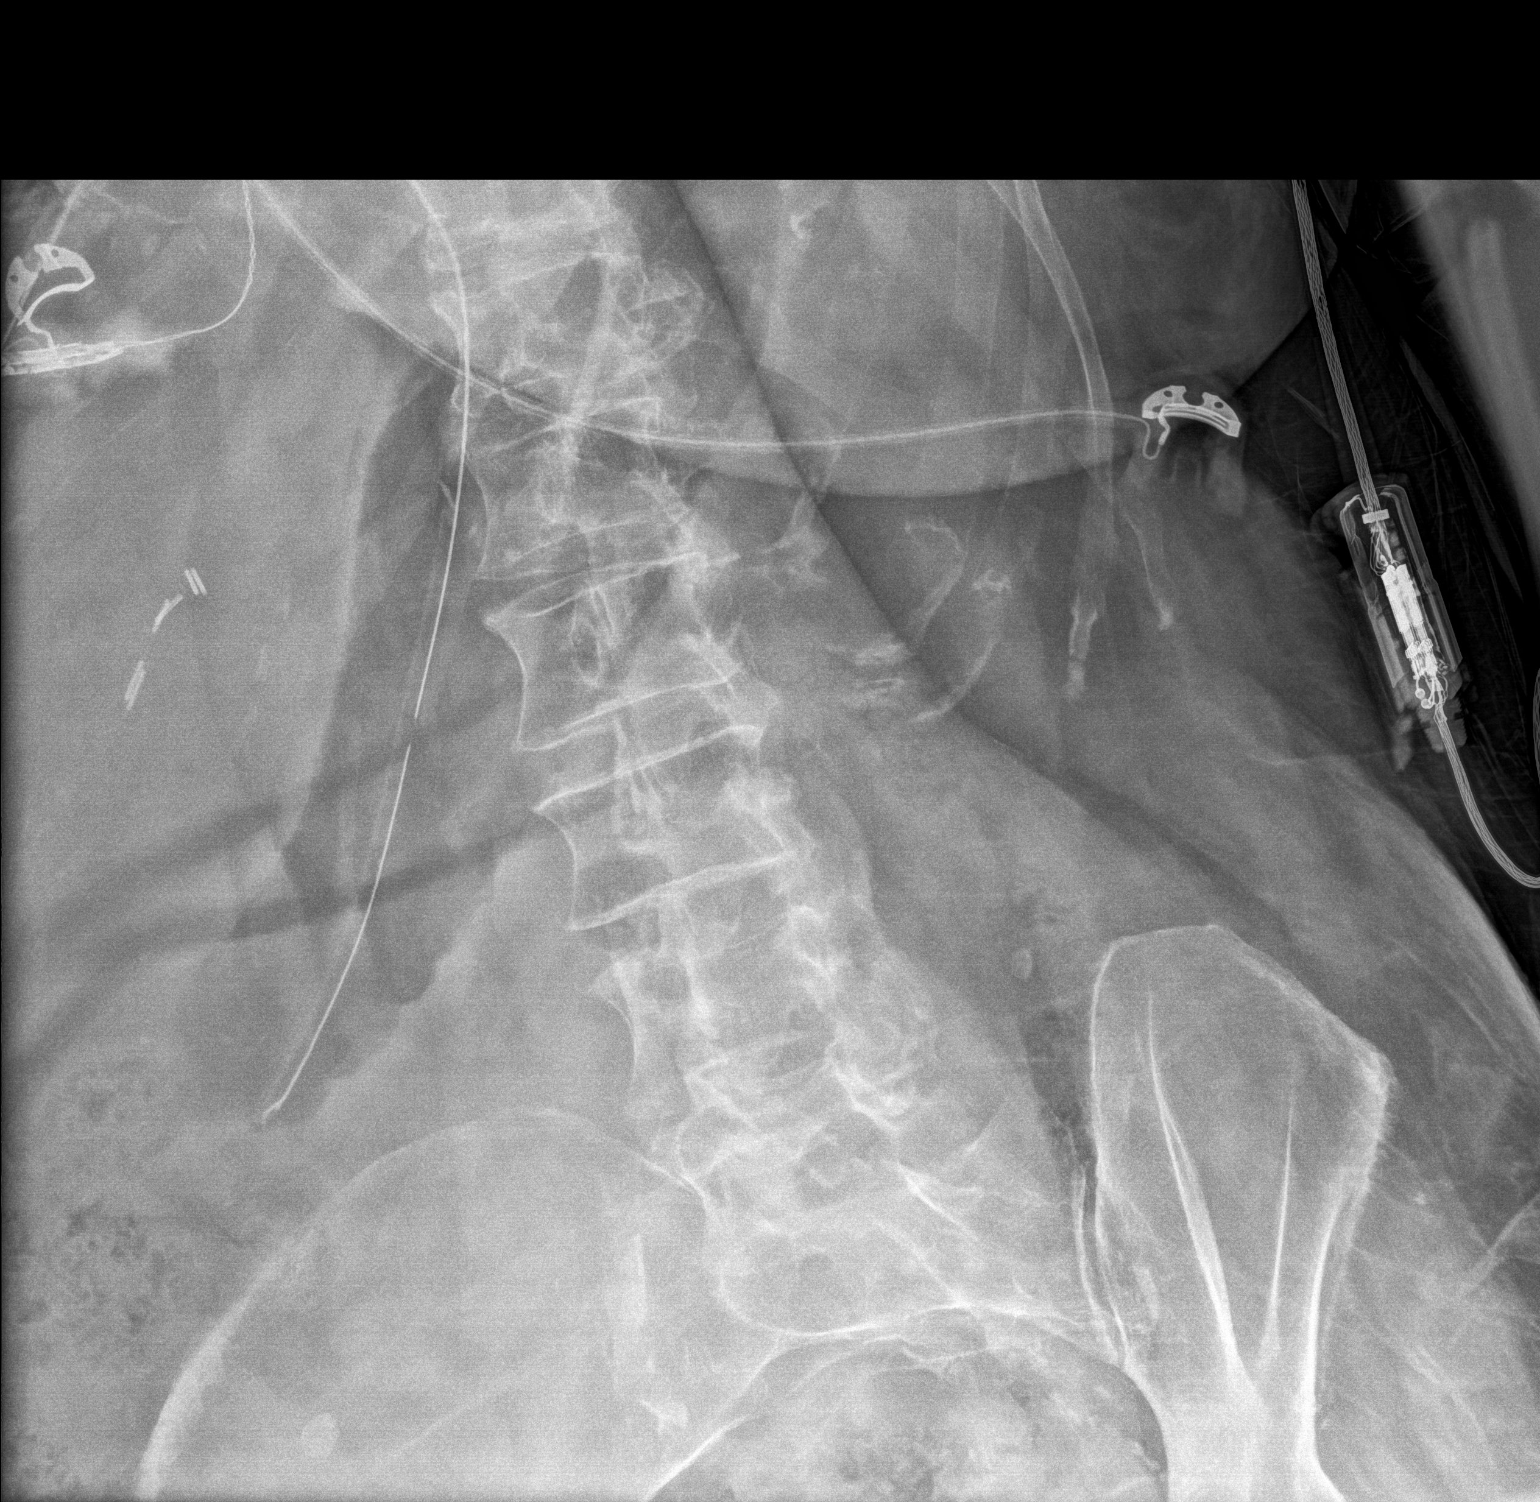

[abdomen supine (2 of 2)]
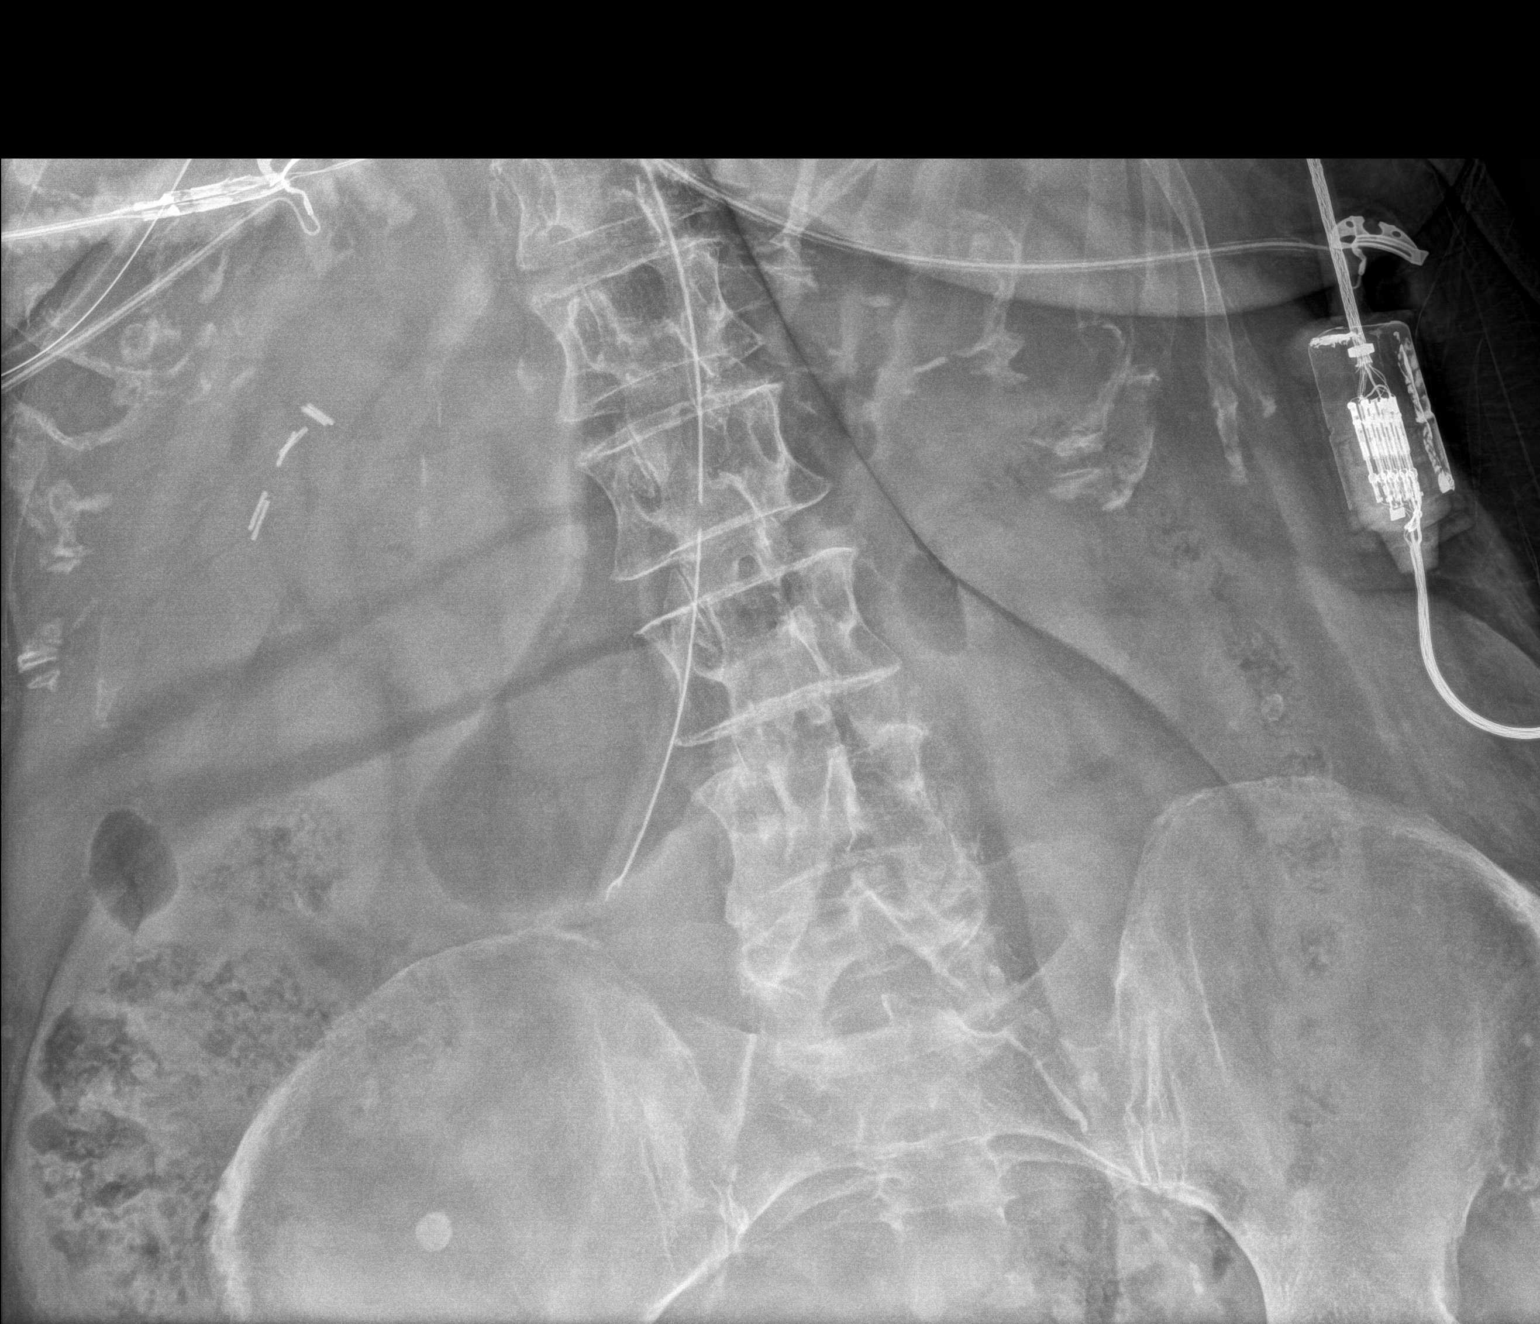

[2 of 2 positions shown; findings below may reference images not displayed]

FINDINGS: A nasogastric tube is seen with tip overlying the gastric antrum. No
evidence of dilated bowel loops. Right upper quadrant surgical clips
are seen from prior cholecystectomy.
IMPRESSION: Nasogastric tube tip overlies the gastric antrum.

## 2022-02-25 SURGERY — ENDARTERECTOMY, CAROTID
Anesthesia: General | Laterality: Left

## 2022-02-25 MED ORDER — FREE WATER
30.0000 mL | Status: DC
  Administered 2022-02-25 – 2022-02-27 (×12): 30 mL

## 2022-02-25 MED ORDER — NOREPINEPHRINE 4 MG/250ML-% IV SOLN
0.0000 ug/min | INTRAVENOUS | Status: DC
  Administered 2022-02-25: 4 ug/min via INTRAVENOUS
  Administered 2022-02-26: 1 ug/min via INTRAVENOUS
  Administered 2022-02-26: 3 ug/min via INTRAVENOUS
  Filled 2022-02-25: qty 250

## 2022-02-25 MED ORDER — PROSOURCE TF PO LIQD
45.0000 mL | Freq: Every day | ORAL | Status: DC
  Administered 2022-02-26: 45 mL
  Filled 2022-02-25: qty 45

## 2022-02-25 MED ORDER — NOREPINEPHRINE BITARTRATE 1 MG/ML IV SOLN
INTRAVENOUS | Status: DC | PRN
  Administered 2022-02-25: 1 mL via INTRAVENOUS

## 2022-02-25 MED ORDER — DOCUSATE SODIUM 50 MG/5ML PO LIQD
100.0000 mg | Freq: Two times a day (BID) | ORAL | Status: DC
  Administered 2022-02-25 – 2022-03-02 (×9): 100 mg
  Filled 2022-02-25 (×9): qty 10

## 2022-02-25 MED ORDER — PROPOFOL 1000 MG/100ML IV EMUL
0.0000 ug/kg/min | INTRAVENOUS | Status: DC
  Administered 2022-02-25 (×2): 50 ug/kg/min via INTRAVENOUS
  Administered 2022-02-25 (×2): 25 ug/kg/min via INTRAVENOUS
  Administered 2022-02-26 – 2022-02-27 (×9): 50 ug/kg/min via INTRAVENOUS
  Filled 2022-02-25 (×12): qty 100

## 2022-02-25 MED ORDER — HEPARIN SODIUM (PORCINE) 1000 UNIT/ML IJ SOLN
INTRAMUSCULAR | Status: DC | PRN
  Administered 2022-02-25: 3000 [IU] via INTRAVENOUS
  Administered 2022-02-25 (×2): 2000 [IU] via INTRAVENOUS

## 2022-02-25 MED ORDER — VANCOMYCIN HCL IN DEXTROSE 1-5 GM/200ML-% IV SOLN
1000.0000 mg | INTRAVENOUS | Status: DC
  Administered 2022-02-26 – 2022-02-28 (×3): 1000 mg via INTRAVENOUS
  Filled 2022-02-25 (×4): qty 200

## 2022-02-25 MED ORDER — INSULIN DETEMIR 100 UNIT/ML ~~LOC~~ SOLN
26.0000 [IU] | Freq: Two times a day (BID) | SUBCUTANEOUS | Status: DC
  Filled 2022-02-25: qty 0.26

## 2022-02-25 MED ORDER — INSULIN ASPART 100 UNIT/ML IJ SOLN
2.0000 [IU] | INTRAMUSCULAR | Status: DC
  Administered 2022-02-26: 6 [IU] via SUBCUTANEOUS
  Administered 2022-02-26: 4 [IU] via SUBCUTANEOUS
  Administered 2022-02-26 (×2): 6 [IU] via SUBCUTANEOUS
  Administered 2022-02-26 – 2022-02-27 (×3): 4 [IU] via SUBCUTANEOUS
  Administered 2022-02-27: 6 [IU] via SUBCUTANEOUS
  Filled 2022-02-25 (×8): qty 1

## 2022-02-25 MED ORDER — PROPOFOL 10 MG/ML IV BOLUS
INTRAVENOUS | Status: DC | PRN
  Administered 2022-02-25: 40 mg via INTRAVENOUS

## 2022-02-25 MED ORDER — LACTATED RINGERS IV BOLUS
500.0000 mL | Freq: Once | INTRAVENOUS | Status: AC
  Administered 2022-02-25: 500 mL via INTRAVENOUS

## 2022-02-25 MED ORDER — LACTATED RINGERS IV SOLN: INTRAVENOUS | Status: AC

## 2022-02-25 MED ORDER — DEXTROSE 50 % IV SOLN: 0.0000 mL | INTRAVENOUS | Status: DC | PRN

## 2022-02-25 MED ORDER — CHLORHEXIDINE GLUCONATE CLOTH 2 % EX PADS
6.0000 | MEDICATED_PAD | Freq: Every day | CUTANEOUS | Status: DC
  Administered 2022-02-25 – 2022-03-15 (×19): 6 via TOPICAL

## 2022-02-25 MED ORDER — NEOMYCIN-POLYMYXIN B GU 40-200000 IR SOLN
Status: DC | PRN
  Administered 2022-02-25: 12 mL

## 2022-02-25 MED ORDER — FENTANYL BOLUS VIA INFUSION
50.0000 ug | INTRAVENOUS | Status: DC | PRN
  Administered 2022-02-25: 100 ug via INTRAVENOUS
  Administered 2022-02-25: 50 ug via INTRAVENOUS
  Administered 2022-02-25: 100 ug via INTRAVENOUS
  Administered 2022-02-26 (×2): 50 ug via INTRAVENOUS
  Administered 2022-02-27: 100 ug via INTRAVENOUS
  Administered 2022-02-27: 50 ug via INTRAVENOUS
  Administered 2022-02-28: 100 ug via INTRAVENOUS
  Filled 2022-02-25: qty 100

## 2022-02-25 MED ORDER — LACTATED RINGERS IV SOLN
INTRAVENOUS | Status: DC | PRN
Start: 2022-02-25 — End: 2022-02-25

## 2022-02-25 MED ORDER — FENTANYL 2500MCG IN NS 250ML (10MCG/ML) PREMIX INFUSION
50.0000 ug/h | INTRAVENOUS | Status: DC
  Administered 2022-02-25: 200 ug/h via INTRAVENOUS
  Administered 2022-02-25: 100 ug/h via INTRAVENOUS
  Administered 2022-02-26 – 2022-02-28 (×6): 200 ug/h via INTRAVENOUS
  Administered 2022-03-01: 50 ug/h via INTRAVENOUS
  Filled 2022-02-25 (×10): qty 250

## 2022-02-25 MED ORDER — NITROGLYCERIN IN D5W 200-5 MCG/ML-% IV SOLN
INTRAVENOUS | Status: AC
  Filled 2022-02-25: qty 250

## 2022-02-25 MED ORDER — NOREPINEPHRINE 4 MG/250ML-% IV SOLN
INTRAVENOUS | Status: AC
  Filled 2022-02-25: qty 250

## 2022-02-25 MED ORDER — LACTATED RINGERS IV BOLUS
1000.0000 mL | Freq: Once | INTRAVENOUS | Status: AC
  Administered 2022-02-25: 1000 mL via INTRAVENOUS

## 2022-02-25 MED ORDER — LABETALOL HCL 5 MG/ML IV SOLN
10.0000 mg | INTRAVENOUS | Status: DC | PRN
  Administered 2022-02-25 – 2022-02-27 (×3): 20 mg via INTRAVENOUS
  Administered 2022-02-27: 10 mg via INTRAVENOUS
  Administered 2022-02-28: 20 mg via INTRAVENOUS
  Filled 2022-02-25 (×4): qty 4

## 2022-02-25 MED ORDER — VITAL HIGH PROTEIN PO LIQD
1000.0000 mL | ORAL | Status: DC
  Administered 2022-02-25 – 2022-02-26 (×4): 1000 mL

## 2022-02-25 MED ORDER — PHENYLEPHRINE 40 MCG/ML (10ML) SYRINGE FOR IV PUSH (FOR BLOOD PRESSURE SUPPORT)
PREFILLED_SYRINGE | INTRAVENOUS | Status: DC | PRN
  Administered 2022-02-25 (×4): 80 ug via INTRAVENOUS
  Administered 2022-02-25: 120 ug via INTRAVENOUS
  Administered 2022-02-25: 80 ug via INTRAVENOUS
  Administered 2022-02-25: 40 ug via INTRAVENOUS
  Administered 2022-02-25 (×3): 80 ug via INTRAVENOUS

## 2022-02-25 MED ORDER — NITROGLYCERIN 0.2 MG/ML ON CALL CATH LAB
INTRAVENOUS | Status: DC | PRN
  Administered 2022-02-25: 100 ug via INTRAVENOUS

## 2022-02-25 MED ORDER — LABETALOL HCL 5 MG/ML IV SOLN: 20.0000 mg | Freq: Once | INTRAVENOUS | Status: AC

## 2022-02-25 MED ORDER — FENTANYL CITRATE PF 50 MCG/ML IJ SOSY
50.0000 ug | PREFILLED_SYRINGE | Freq: Once | INTRAMUSCULAR | Status: AC
  Administered 2022-02-25: 50 ug via INTRAVENOUS
  Filled 2022-02-25: qty 1

## 2022-02-25 MED ORDER — FENTANYL CITRATE (PF) 100 MCG/2ML IJ SOLN
INTRAMUSCULAR | Status: DC | PRN
  Administered 2022-02-25 (×2): 50 ug via INTRAVENOUS

## 2022-02-25 MED ORDER — HEPARIN SODIUM (PORCINE) 5000 UNIT/ML IJ SOLN
INTRAMUSCULAR | Status: AC
  Filled 2022-02-25: qty 1

## 2022-02-25 MED ORDER — KETAMINE HCL 10 MG/ML IJ SOLN
INTRAMUSCULAR | Status: DC | PRN
  Administered 2022-02-25: 50 mg via INTRAVENOUS

## 2022-02-25 MED ORDER — SODIUM CHLORIDE 0.9 % IV SOLN: INTRAVENOUS | Status: DC | PRN

## 2022-02-25 MED ORDER — INSULIN DETEMIR 100 UNIT/ML ~~LOC~~ SOLN
26.0000 [IU] | Freq: Two times a day (BID) | SUBCUTANEOUS | Status: DC
  Administered 2022-02-25 – 2022-02-26 (×3): 26 [IU] via SUBCUTANEOUS
  Filled 2022-02-25 (×4): qty 0.26

## 2022-02-25 MED ORDER — LACTATED RINGERS IV BOLUS
500.0000 mL | Freq: Once | INTRAVENOUS | Status: AC
Start: 2022-02-25 — End: 2022-02-25
  Administered 2022-02-25: 500 mL via INTRAVENOUS

## 2022-02-25 MED ORDER — LABETALOL HCL 5 MG/ML IV SOLN
INTRAVENOUS | Status: AC
  Administered 2022-02-25: 20 mg
  Filled 2022-02-25: qty 4

## 2022-02-25 MED ORDER — CEFAZOLIN SODIUM-DEXTROSE 1-4 GM/50ML-% IV SOLN
INTRAVENOUS | Status: DC | PRN
Start: 2022-02-25 — End: 2022-02-25
  Administered 2022-02-25: 2 g via INTRAVENOUS

## 2022-02-25 MED ORDER — MAGNESIUM SULFATE 4 GM/100ML IV SOLN
4.0000 g | Freq: Once | INTRAVENOUS | Status: AC
  Administered 2022-02-25: 4 g via INTRAVENOUS
  Filled 2022-02-25: qty 100

## 2022-02-25 MED ORDER — MIDAZOLAM HCL 2 MG/2ML IJ SOLN
INTRAMUSCULAR | Status: DC | PRN
  Administered 2022-02-25 (×2): 2 mg via INTRAVENOUS

## 2022-02-25 MED ORDER — ADULT MULTIVITAMIN LIQUID CH
15.0000 mL | Freq: Every day | ORAL | Status: DC
  Administered 2022-02-26 – 2022-02-27 (×2): 15 mL
  Filled 2022-02-25 (×2): qty 15

## 2022-02-25 MED ORDER — PANTOPRAZOLE 2 MG/ML SUSPENSION: 40.0000 mg | Freq: Every day | ORAL | Status: DC

## 2022-02-25 MED ORDER — HEPARIN 5000 UNITS IN NS 1000 ML (FLUSH)
INTRAMUSCULAR | Status: DC | PRN
  Administered 2022-02-25: 250 mL via INTRAMUSCULAR

## 2022-02-25 MED ORDER — ROCURONIUM BROMIDE 100 MG/10ML IV SOLN
INTRAVENOUS | Status: DC | PRN
  Administered 2022-02-25 (×2): 100 mg via INTRAVENOUS

## 2022-02-25 MED ORDER — NOREPINEPHRINE 4 MG/250ML-% IV SOLN
INTRAVENOUS | Status: AC
  Administered 2022-02-25: 4 mg via INTRAVENOUS
  Filled 2022-02-25: qty 250

## 2022-02-25 MED ORDER — INSULIN REGULAR(HUMAN) IN NACL 100-0.9 UT/100ML-% IV SOLN: INTRAVENOUS | Status: DC

## 2022-02-25 MED ORDER — POLYETHYLENE GLYCOL 3350 17 G PO PACK
17.0000 g | PACK | Freq: Every day | ORAL | Status: DC
  Administered 2022-02-26 – 2022-03-02 (×4): 17 g
  Filled 2022-02-25 (×4): qty 1

## 2022-02-25 MED ORDER — VANCOMYCIN HCL 1750 MG/350ML IV SOLN
1750.0000 mg | Freq: Once | INTRAVENOUS | Status: AC
  Administered 2022-02-25: 1750 mg via INTRAVENOUS
  Filled 2022-02-25: qty 350

## 2022-02-25 MED ORDER — INSULIN REGULAR(HUMAN) IN NACL 100-0.9 UT/100ML-% IV SOLN
INTRAVENOUS | Status: DC
  Administered 2022-02-25: 16 [IU]/h via INTRAVENOUS
  Administered 2022-02-25: 24 [IU]/h via INTRAVENOUS
  Filled 2022-02-25 (×2): qty 100

## 2022-02-25 MED ORDER — PANTOPRAZOLE SODIUM 40 MG IV SOLR
40.0000 mg | INTRAVENOUS | Status: DC
Start: 2022-02-25 — End: 2022-03-02
  Administered 2022-02-25 – 2022-03-02 (×6): 40 mg via INTRAVENOUS
  Filled 2022-02-25 (×6): qty 10

## 2022-02-25 SURGICAL SUPPLY — 75 items
ADH SKN CLS APL DERMABOND .7 (GAUZE/BANDAGES/DRESSINGS) ×1
APL PRP STRL LF DISP 70% ISPRP (MISCELLANEOUS) ×3
APPLIER CLIP 11 MED OPEN (CLIP) ×2
APPLIER CLIP 9.375 SM OPEN (CLIP) ×2
APR CLP MED 11 20 MLT OPN (CLIP) ×1
APR CLP SM 9.3 20 MLT OPN (CLIP) ×1
BAG DECANTER FOR FLEXI CONT (MISCELLANEOUS) ×2 IMPLANT
BLADE SURG 15 STRL LF DISP TIS (BLADE) ×1 IMPLANT
BLADE SURG 15 STRL SS (BLADE) ×2
BLADE SURG SZ11 CARB STEEL (BLADE) ×3 IMPLANT
BOOT SUTURE AID YELLOW STND (SUTURE) ×3 IMPLANT
BRUSH SCRUB EZ  4% CHG (MISCELLANEOUS) ×2
BRUSH SCRUB EZ 4% CHG (MISCELLANEOUS) ×1 IMPLANT
CHLORAPREP W/TINT 26 (MISCELLANEOUS) ×4 IMPLANT
CLIP APPLIE 11 MED OPEN (CLIP) IMPLANT
CLIP APPLIE 9.375 SM OPEN (CLIP) IMPLANT
DERMABOND ADVANCED (GAUZE/BANDAGES/DRESSINGS) ×1
DERMABOND ADVANCED .7 DNX12 (GAUZE/BANDAGES/DRESSINGS) ×1 IMPLANT
DRAPE 3/4 80X56 (DRAPES) ×2 IMPLANT
DRAPE INCISE IOBAN 66X45 STRL (DRAPES) ×3 IMPLANT
DRAPE LAPAROTOMY 77X122 PED (DRAPES) ×2 IMPLANT
ELECT CAUTERY BLADE 6.4 (BLADE) ×2 IMPLANT
ELECT REM PT RETURN 9FT ADLT (ELECTROSURGICAL) ×2
ELECTRODE REM PT RTRN 9FT ADLT (ELECTROSURGICAL) ×1 IMPLANT
GAUZE 4X4 16PLY ~~LOC~~+RFID DBL (SPONGE) ×3 IMPLANT
GLOVE BIO SURGEON STRL SZ7 (GLOVE) ×8 IMPLANT
GOWN STRL REUS W/ TWL LRG LVL3 (GOWN DISPOSABLE) ×2 IMPLANT
GOWN STRL REUS W/ TWL XL LVL3 (GOWN DISPOSABLE) ×2 IMPLANT
GOWN STRL REUS W/TWL LRG LVL3 (GOWN DISPOSABLE) ×6
GOWN STRL REUS W/TWL XL LVL3 (GOWN DISPOSABLE) ×4
HEMOSTAT SURGICEL 2X3 (HEMOSTASIS) ×2 IMPLANT
IV NS 250ML (IV SOLUTION) ×2
IV NS 250ML BAXH (IV SOLUTION) ×1 IMPLANT
KIT TURNOVER KIT A (KITS) ×2 IMPLANT
LABEL OR SOLS (LABEL) ×2 IMPLANT
LOOP RED MAXI  1X406MM (MISCELLANEOUS) ×3
LOOP VESSEL MAXI  1X406 RED (MISCELLANEOUS) ×3
LOOP VESSEL MAXI 1X406 RED (MISCELLANEOUS) ×2 IMPLANT
LOOP VESSEL MINI 0.8X406 BLUE (MISCELLANEOUS) ×1 IMPLANT
LOOPS BLUE MINI 0.8X406MM (MISCELLANEOUS) ×1
MANIFOLD NEPTUNE II (INSTRUMENTS) ×3 IMPLANT
NDL FILTER BLUNT 18X1 1/2 (NEEDLE) ×1 IMPLANT
NDL HYPO 25X1 1.5 SAFETY (NEEDLE) ×1 IMPLANT
NEEDLE FILTER BLUNT 18X 1/2SAF (NEEDLE) ×1
NEEDLE FILTER BLUNT 18X1 1/2 (NEEDLE) ×1 IMPLANT
NEEDLE HYPO 25X1 1.5 SAFETY (NEEDLE) ×2 IMPLANT
NS IRRIG 500ML POUR BTL (IV SOLUTION) ×3 IMPLANT
PACK BASIN MAJOR ARMC (MISCELLANEOUS) ×3 IMPLANT
PAD ONESTEP ZOLL R SERIES ADT (MISCELLANEOUS) ×1 IMPLANT
PENCIL ELECTRO HAND CTR (MISCELLANEOUS) ×1 IMPLANT
SET WALTER ACTIVATION W/DRAPE (SET/KITS/TRAYS/PACK) ×2 IMPLANT
SHUNT W TPORT 9FR PRUITT F3 (SHUNT) ×2 IMPLANT
SPONGE KITTNER 5P (MISCELLANEOUS) ×1 IMPLANT
SPONGE T-LAP 18X18 ~~LOC~~+RFID (SPONGE) ×5 IMPLANT
SUT MNCRL 4-0 (SUTURE) ×2
SUT MNCRL 4-0 27XMFL (SUTURE) ×1
SUT PROLENE 5-0 (SUTURE) ×10
SUT PROLENE 5-0 BB 36X2 ARM (SUTURE) ×5
SUT PROLENE 6 0 BV (SUTURE) ×12 IMPLANT
SUT PROLENE 7 0 BV 1 (SUTURE) ×3 IMPLANT
SUT SILK 2 0 (SUTURE) ×4
SUT SILK 2-0 18XBRD TIE 12 (SUTURE) ×1 IMPLANT
SUT SILK 3 0 (SUTURE) ×4
SUT SILK 3-0 18XBRD TIE 12 (SUTURE) ×1 IMPLANT
SUT SILK 4 0 (SUTURE) ×4
SUT SILK 4-0 18XBRD TIE 12 (SUTURE) ×1 IMPLANT
SUT VIC AB 3-0 SH 27 (SUTURE) ×6
SUT VIC AB 3-0 SH 27X BRD (SUTURE) ×2 IMPLANT
SUTURE MNCRL 4-0 27XMF (SUTURE) ×1 IMPLANT
SUTURE PROLEN 5-0 BB 36X2 ARM (SUTURE) IMPLANT
SYR 10ML LL (SYRINGE) ×4 IMPLANT
SYR 20ML LL LF (SYRINGE) ×3 IMPLANT
TRAY FOLEY MTR SLVR 16FR STAT (SET/KITS/TRAYS/PACK) ×2 IMPLANT
TUBING CONNECTING 10 (TUBING) ×1 IMPLANT
WATER STERILE IRR 500ML POUR (IV SOLUTION) ×2 IMPLANT

## 2022-02-25 NOTE — Consult Note (Signed)
Pharmacy Antibiotic Note ? ?Veronica Murray is a 66 y.o. female admitted on 02/24/2022 with  wound infection . Pharmacy has been consulted for Vancomycin dosing. Patien s/p carotid endarterectomy on 3/17.  ? ?Plan: ?Vancomycin 1750 mg loading dose , followed by  ?Vancomycin 1000 mg IV Q 24 hrs. ?Goal AUC 400-550. ?Expected AUC: 502 ?SCr used: 0.8 (actual 0.6) ? ?Height: 5\' 2"  (157.5 cm) ?Weight: 77.1 kg (170 lb) ?IBW/kg (Calculated) : 50.1 ? ?Temp (24hrs), Avg:97.3 ?F (36.3 ?C), Min:97.3 ?F (36.3 ?C), Max:97.3 ?F (36.3 ?C) ? ?Recent Labs  ?Lab 02/24/22 ?2211 02/24/22 ?2216 02/24/22 ?2251  ?WBC 7.4  --   --   ?CREATININE 0.82 0.60  --   ?LATICACIDVEN  --   --  >9.0*  ?  ?Estimated Creatinine Clearance: 69.2 mL/min (by C-G formula based on SCr of 0.6 mg/dL).   ? ?Allergies  ?Allergen Reactions  ? Codeine Hives, Shortness Of Breath and Nausea Only  ? Toradol [Ketorolac Tromethamine] Hives, Shortness Of Breath and Nausea Only  ? Tramadol Hcl Shortness Of Breath and Nausea Only  ?  "Conflicts with bipolar condition"  ? ? ?Antimicrobials this admission: ?4/17 Vancomycin >>  ? ?Dose adjustments this admission: none ? ?Microbiology results: ?4/17 MRSA PCR: ordered ?4/17: Wound culture: sent ? ?Thank you for allowing pharmacy to be a part of this patient?s care. ? ?Cadden Elizondo Rodriguez-Guzman PharmD, BCPS ?02/25/2022 4:54 AM ? ? ?

## 2022-02-25 NOTE — Progress Notes (Signed)
Inpatient Diabetes Program Recommendations ? ?AACE/ADA: New Consensus Statement on Inpatient Glycemic Control  ?Target Ranges:  Prepandial:   less than 140 mg/dL ?     Peak postprandial:   less than 180 mg/dL (1-2 hours) ?     Critically ill patients:  140 - 180 mg/dL  ? ? Latest Reference Range & Units 02/25/22 04:30 02/25/22 06:20 02/25/22 07:18 02/25/22 07:46  ?Glucose-Capillary 70 - 99 mg/dL 213 (H) 086 (H) 578 (H) 385 (H)  ? ? Latest Reference Range & Units 06/25/19 19:20 01/24/22 18:13  ?Hemoglobin A1C 4.8 - 5.6 % 11.4 (H) 12.8 (H)  ? ?Review of Glycemic Control ? ?Diabetes history: DM2 hx ?Outpatient Diabetes medications: None listed but per chart insulin requiring  ?Current orders for Inpatient glycemic control: IV insulin ? ?Inpatient Diabetes Program Recommendations:   ? ?Insulin: Recommend continuing with IV insulin per ICU Glycemic Control Phase 2 order set. ? ?NOTE: Per chart, patient admitted with acute bleed from carotid endarterectomy patch blow out hemorrhage (carotid endarterectomy done on 01/25/22). Patient had emergent vascular surgery today around 5am, remains on ventilator, and plan to return to OR later today for additional repair. Diabetes coordinator will continue to follow along while inpatient. ? ?Thanks, ?Orlando Penner, RN, MSN, CDE ?Diabetes Coordinator ?Inpatient Diabetes Program ?[phone] (Team Pager from 8am to 5pm) ? ? ? ?

## 2022-02-25 NOTE — Transfer of Care (Signed)
Immediate Anesthesia Transfer of Care Note ? ?Patient: Veronica Murray ? ?Procedure(s) Performed: ENDARTERECTOMY CAROTID revision (Left) ? ?Patient Location: ICU ? ?Anesthesia Type:General ? ?Level of Consciousness: Patient remains intubated per anesthesia plan ? ?Airway & Oxygen Therapy: Patient remains intubated per anesthesia plan ? ?Post-op Assessment: Report given to RN and Post -op Vital signs reviewed and stable ? ?Post vital signs: Reviewed and stable ? ?Last Vitals:  ?Vitals Value Taken Time  ?BP 191/92 02/25/22 0415  ?Temp    ?Pulse 96 02/25/22 0420  ?Resp 14 02/25/22 0420  ?SpO2 100 % 02/25/22 0420  ?Vitals shown include unvalidated device data. ? ?Last Pain: There were no vitals filed for this visit.   ? ?  ? ?Complications: No notable events documented. ?

## 2022-02-25 NOTE — Op Note (Signed)
[facility] ? ? ?OPERATIVE NOTE ? ?PROCEDURE:   ?1.  LEFT neck exploration,hemostasis, Partial cormatrix patch excision, primary suture repair of common carotid and internal carotid artery with reconstruction ? ?PRE-OPERATIVE DIAGNOSIS: 1.  LEFT Carotid endarterectomy patch blow out, hemorrhage ? ? ?POST-OPERATIVE DIAGNOSIS: same as above  ? ?SURGEON: Eli Hose, MD ? ?ASSISTANT(S): None ? ?ANESTHESIA: general ? ?ESTIMATED BLOOD LOSS: 350 cc ? ?FINDING(S): ?1.  Significant hemorrhage on the left neck with bleeding from the left common carotid and internal carotid artery CorMatrix patch.  The patch was essentially detached from its anastomosis.  The artery itself was  friable and abnormal in nature. ? ?SPECIMEN(S):  Carotid patch ? ?INDICATIONS:   ?Veronica Murray is Murray 66 y.o. female who presents with hemorrhage secondary to left carotid artery patch blowout status post left carotid endarterectomy.  I discussed with the patient the risks, benefits, and alternatives with patient was awake alert and aware of the risks not limited to: bleeding, infection, stroke, myocardial infarction, death, cranial nerve injuries both temporary and permanent, neck hematoma, possible airway compromise, labile blood pressure post-operatively, cerebral hyperperfusion syndrome, and possible need for additional interventions in the future. The patient is aware of the risks and agrees to proceed forward with the procedure. ? ?DESCRIPTION: ?After full informed written consent was obtained from the patient, the patient was brought back to the operating room and placed supine upon the operating table.  Prior to induction, the patient received IV antibiotics.  After obtaining adequate anesthesia, the patient was placed into Murray modified beach chair position with Murray shoulder roll in place and the patient's neck slightly hyperextended and rotated away from the surgical site.  The patient was prepped in the standard fashion  for Murray left carotid exploration.  Pressure was held the entirety of the anesthesia and prepping with prepping Murray sterile hand in the field secondary to hemorrhage.  I made an incision at the prior incision site with the #15 blade.  This was quickly carried down to the carotid artery and area of bleeding.  There was significant hemorrhage however was able to obtain direct digital control at the site of this disrupted and essentially disintegrated patch.  I then quickly placed 5-0 Prolene sutures in Murray running fashion for hemostatic control.  Once I had obtained control the wound was irrigated to identify landmarks.  Unfortunately the patient's neck had significant scar tissue from her prior endarterectomy and the anatomy was quite distorted.  I was able to dissect out the proximal common carotid artery this area proximal to the aforementioned area of bleeding and prior patch.  After significant dissection I was able to encircled the common carotid artery with Murray vessel loop for proximal control..  I then gave the patient 5000 units of heparin as I had been holding significant digital control and was concerned about thrombus and embolic phenomenon.  I then attempted to dissect the internal carotid artery and external carotid artery however again this was significantly scarred in.  The tissues were incredibly friable there was significant bleeding within the wound bed.  I noted that the internal carotid artery appeared significantly abnormal.  After significant amount of time and attempted dissection I elected at 2 exercises much of the CorMatrix patch as possible after cinching down the common carotid artery vessel loop.  I then performed Murray simple running stitch using Murray 6-0 Prolene along the area of concern to the area that appeared to be normal on the CorMatrix  patch.  The patient's overall carotid artery was friable and significantly scarred in.  There were no other options other than primarily ligating the common  carotid artery which would be the only certain option.  However after oversewing the area of concern in monitoring the wound there was no further bleeding.  I did listen with Murray Doppler and there was flow in the internal and external carotid arteries.  At this juncture it is all other options had been exhausted the wound was copiously irrigated with antibiotic irrigation.  I then closed the wound in layers using Murray 3-0 Vicryl and Murray flat JP drain was placed anterior to my first layer of closure to avoid disruption of the carotid artery.  I then closed the skin with 4-0 Monocryl Dermabond and Murray dry sterile dressing were placed.  We will attempt carotid stent tomorrow as her only option other than primary ligation.  The patient was transferred to the ICU in critical condition.    ?COMPLICATIONS: none ? ?CONDITION: critical, guarded ? ?Veronica Murray ? ?02/25/2022, 4:59 AM ? ? ? ?This note was created with Dragon Medical transcription system. Any errors in dictation are purely unintentional.  ?

## 2022-02-25 NOTE — Progress Notes (Signed)
Initial Nutrition Assessment ? ?DOCUMENTATION CODES:  ? ?Obesity unspecified ? ?INTERVENTION:  ? ?Vital HP@45ml /hr- Initiate at 23ml/hr and increase by 62ml/hr q 8 hours until goal rate is reached.  ? ?Pro-Source 50ml daily via tube, provides 40kcal and 11g of protein per serving  ? ?Propofol: 11.57 ml/hr- provides 305kcal/day  ? ?Free water flushes 59ml q4 hours to maintain tube patency  ? ?Regimen provides 1425kcal/day, 106g/day protein and 1012ml/day of free water  ? ?Liquid MVI daily via tube  ? ?NUTRITION DIAGNOSIS:  ? ?Inadequate oral intake related to inability to eat (pt sedated and ventilated) as evidenced by NPO status. ? ?GOAL:  ? ?Provide needs based on ASPEN/SCCM guidelines ? ?MONITOR:  ? ?Vent status, Labs, Weight trends, Skin, I & O's ? ?REASON FOR ASSESSMENT:  ? ?Malnutrition Screening Tool, Ventilator ?  ? ?ASSESSMENT:  ? 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 ? ?Pt sedated and ventilated. OGT in place; will plan to initiate tube feeds today. Per chart, pt appears weight stable at baseline. RD suspects pt with good oral intake pta.  ? ?Medications reviewed and include: colace, protonix, miralax, insulin, propofol, vancomycin  ? ?Labs reviewed: Na 132(L), K 3.8 wnl, P 4.5 wnl, Mg 1.4(L) ?Hgb 10.3(L), Hct 30.9(L) ?Cbgs- 230, 254, 248, 303 x 24 hrs ?AIC- 8.5(H) ? ?Patient is currently intubated on ventilator support ?MV: 9.4 L/min ?Temp (24hrs), Avg:96.1 ?F (35.6 ?C), Min:95 ?F (35 ?C), Max:97.5 ?F (36.4 ?C) ? ?Propofol: 11.57 ml/hr- provides 305kcal/day  ? ?MAP- >47mmHg  ? ?UOP- 1354ml ? ?JP drain with minimal output ? ?NUTRITION - FOCUSED PHYSICAL EXAM: ? ?Flowsheet Row Most  Recent Value  ?Orbital Region No depletion  ?Upper Arm Region No depletion  ?Thoracic and Lumbar Region No depletion  ?Buccal Region No depletion  ?Temple Region No depletion  ?Clavicle Bone Region No depletion  ?Clavicle and Acromion Bone Region No depletion  ?Scapular Bone Region No depletion  ?Dorsal Hand No depletion  ?Patellar Region Moderate depletion  ?Anterior Thigh Region Mild depletion  ?Posterior Calf Region Mild depletion  ?Edema (RD Assessment) Mild  ?Hair Reviewed  ?Eyes Reviewed  ?Mouth Reviewed  ?Skin Reviewed  ?Nails Reviewed  ? ?Diet Order:   ?Diet Order   ? ?       ?  Diet NPO time specified  Diet effective now       ?  ? ?  ?  ? ?  ? ?EDUCATION NEEDS:  ? ?No education needs have been identified at this time ? ?Skin:  Skin Assessment: Reviewed RN Assessment (incision L neck) ? ?Last BM:  4/17- type 6 ? ?Height:  ? ?Ht Readings from Last 1 Encounters:  ?02/25/22 5\' 2"  (1.575 m)  ? ? ?Weight:  ? ?Wt Readings from Last 1 Encounters:  ?02/25/22 77.1 kg  ? ? ?Ideal Body Weight:  50 kg ? ?BMI:  Body mass index is 31.09 kg/m?. ? ?Estimated Nutritional Needs:  ? ?Kcal:  1390kcal/day ? ?Protein:  >100g/day ? ?Fluid:  1.5-1.8L/day ? ?Koleen Distance MS, RD, LDN ?Please refer to [facility] for RD and/or RD on-call/weekend/after hours pager ? ?

## 2022-02-25 NOTE — Anesthesia Procedure Notes (Signed)
Arterial Line Insertion ?Start/End4/17/2023 12:17 AM, 02/25/2022 12:20 AM ?Performed by: Lenard Simmer, MD, Katherine Basset, CRNA, CRNA ? Patient location: OR. ?Preanesthetic checklist: patient identified, IV checked, site marked, risks and benefits discussed, surgical consent, monitors and equipment checked, pre-op evaluation, timeout performed and anesthesia consent ?Lidocaine 1% used for infiltration and patient sedated ?Right, radial was placed ?Catheter size: 20 G ?Hand hygiene performed  and maximum sterile barriers used  ? ?Attempts: 1 ?Procedure performed without using ultrasound guided technique. ?Following insertion, dressing applied and Biopatch. ?Post procedure assessment: normal and unchanged ? ?Patient tolerated the procedure well with no immediate complications. ? ? ?

## 2022-02-25 NOTE — Anesthesia Procedure Notes (Signed)
Procedure Name: Intubation ?Date/Time: 02/25/2022 12:24 AM ?Performed by: Esaw Grandchild, CRNA ?Pre-anesthesia Checklist: Patient identified, Emergency Drugs available, Suction available and Patient being monitored ?Patient Re-evaluated:Patient Re-evaluated prior to induction ?Oxygen Delivery Method: Circle system utilized ?Preoxygenation: Pre-oxygenation with 100% oxygen ?Induction Type: IV induction, Rapid sequence and Cricoid Pressure applied ?Ventilation: Mask ventilation without difficulty ?Laryngoscope Size: McGraph and 3 ?Grade View: Grade I ?Tube type: Oral ?Tube size: 7.0 mm ?Number of attempts: 1 ?Airway Equipment and Method: Stylet, Oral airway and Bite block ?Placement Confirmation: ETT inserted through vocal cords under direct vision, positive ETCO2 and breath sounds checked- equal and bilateral ?Secured at: 21 cm ?Tube secured with: Tape ?Dental Injury: Teeth and Oropharynx as per pre-operative assessment  ? ? ? ? ?

## 2022-02-25 NOTE — Anesthesia Postprocedure Evaluation (Signed)
Anesthesia Post Note ? ?Patient: Veronica Murray ? ?Procedure(s) Performed: ENDARTERECTOMY CAROTID revision (Left) ? ?Patient location during evaluation: SICU ?Anesthesia Type: General ?Level of consciousness: sedated ?Pain management: pain level controlled ?Vital Signs Assessment: post-procedure vital signs reviewed and stable ?Respiratory status: patient remains intubated per anesthesia plan ?Cardiovascular status: stable ?Postop Assessment: no apparent nausea or vomiting ?Anesthetic complications: no ? ? ?No notable events documented. ? ? ?Last Vitals:  ?Vitals:  ? 02/25/22 0830 02/25/22 0843  ?BP:    ?Pulse: (!) 50 (!) 51  ?Resp: 15 10  ?Temp:    ?SpO2: 100% 100%  ?  ?Last Pain:  ?Vitals:  ? 02/25/22 0732  ?TempSrc: Axillary  ? ? ?  ?  ?  ?  ?  ?  ? ?Kellyn Mansfield B Draven Natter ? ? ? ? ?

## 2022-02-25 NOTE — Anesthesia Preprocedure Evaluation (Signed)
Anesthesia Evaluation  ?Patient identified by MRN, date of birth, ID band ?Patient awake ? ? ? ?Reviewed: ?Allergy & Precautions, NPO status , Patient's Chart, lab work & pertinent test results ? ?History of Anesthesia Complications ?Negative for: history of anesthetic complications ? ?Airway ?Mallampati: III ? ? ?Neck ROM: Full ? ? ? Dental ? ?(+) Poor Dentition, Dental Advidsory Given ?  ?Pulmonary ?neg COPD, neg recent URI, Current Smoker and Patient abstained from smoking.,  ?  ?Pulmonary exam normal ?breath sounds clear to auscultation ? ? ? ? ? ? Cardiovascular ?hypertension, (-) angina+ CAD and + Peripheral Vascular Disease (carotid stenosis on Plavix)  ?(-) Past MI Normal cardiovascular exam(-) dysrhythmias (-) Valvular Problems/Murmurs ?Rhythm:Regular Rate:Normal ? ? ?  ?Neuro/Psych ?negative neurological ROS ?   ? GI/Hepatic ?negative GI ROS,   ?Endo/Other  ?diabetes, Type 2Obesity  ? Renal/GU ?negative Renal ROS  ? ?  ?Musculoskeletal ? ?(+) Fibromyalgia - ? Abdominal ?  ?Peds ? Hematology ?negative hematology ROS ?(+)   ?Anesthesia Other Findings ?Past Medical History: ?No date: Aortic arch atherosclerosis (HCC) ?No date: Diabetes mellitus without complication (HCC) ?No date: Essential hypertension ?No date: Fibromyalgia ?No date: Tobacco abuse ? ?Patient emergently brought to the OR for bleed from previous carotid surgery.  Patient has received 3 units pRBCs from outside hospital per report.  Patient arriving to the OR with Dr. Evie Lacks holding pressure to the patient's neck.  Patient consented for emergency surgery.  She was A&Ox3 and did not have any questions. ? Reproductive/Obstetrics ? ?  ? ? ? ? ? ? ? ? ? ? ? ? ? ?  ?  ? ? ? ? ? ? ? ? ?Anesthesia Physical ? ?Anesthesia Plan ? ?ASA: 4 and emergent ? ?Anesthesia Plan: General  ? ?Post-op Pain Management:   ? ?Induction: Intravenous, Rapid sequence and Cricoid pressure planned ? ?PONV Risk Score and Plan: 2 and  Ondansetron, Dexamethasone and Treatment may vary due to age or medical condition ? ?Airway Management Planned: Oral ETT ? ?Additional Equipment: Arterial line ? ?Intra-op Plan:  ? ?Post-operative Plan: Post-operative intubation/ventilation ? ?Informed Consent: I have reviewed the patients History and Physical, chart, labs and discussed the procedure including the risks, benefits and alternatives for the proposed anesthesia with the patient or authorized representative who has indicated his/her understanding and acceptance.  ? ? ? ?Dental advisory given ? ?Plan Discussed with: CRNA ? ?Anesthesia Plan Comments: (Patient consented for risks of anesthesia including but not limited to:  ?- adverse reactions to medications ?- damage to eyes, teeth, lips or other oral mucosa ?- nerve damage due to positioning  ?- sore throat or hoarseness ?- damage to heart, brain, nerves, lungs, other parts of body or loss of life ? ?Informed patient about role of CRNA in peri- and intra-operative care.  Patient voiced understanding.)  ? ? ? ? ? ? ?Anesthesia Quick Evaluation ? ?

## 2022-02-25 NOTE — ED Triage Notes (Signed)
pt came in VIA [facility] to [facility]  ED at 2359 HR 65, all given by [facility] of fentynl 4mg  of zofran, pt recieved 3 units blood total; 136/72; Vascular MD in ROOM right now. evaluated by ER MD  At this time 0000. ?

## 2022-02-25 NOTE — Consult Note (Addendum)
? ?NAME:  Veronica Murray, MRN:  [id number], DOB:  1956/10/09, LOS: 0 ?ADMISSION DATE:  02/24/2022, CONSULTATION DATE:  02/25/22 ?REFERRING MD:  Dr. Lorenso Courier, CHIEF COMPLAINT:  carotid artery rupture  ? ?History of Present Illness:  ?66 yo F transferred emergently to [facility] ED from [facility] ED for emergent vascular intervention on a ruptured carotid endarterectomy wound. History obtained from Epic documentation, patient intubated and sedated.  ?Patient underwent LEFT endarterectomy on 01/24/22 at [facility] with Dr. Lucky Cowboy without complication. Follow up appointment on 02/01/22 revealed no complications. Patient visited an urgent care on 02/22/22 with concern for infected endarterectomy site, picture in epic shows redness, edema and drainage around healing surgical site. She was hemodynamically stable and was started on a 3 day course of Keflex, instructed to f/u with provider. ?On 02/24/22 patient brought in via EMS to [facility] ED with reports that surgical site burst open and began bleeding heavily. Per [facility] documentation patient was grey in color on arrival, unable to speak but one word at a time, EMS reporting hypotension with SBP in the 70's on arrival to [facility] ED. ?Patient was transferred to [facility] for emergent vascular intervention. ?ED course: ?At [facility] patient received 3 units of PRBC's & bolus of NS. In transport patient received fentanyl & zofran. Upon arrival at [facility] she was taken immediately to the OR for vascular intervention. ? ?Initial Vitals ([facility] ED): 97.3, 16, 81, 142/69 & 96% on RA ?Significant labs: (Labs/ Imaging personally reviewed) ?Chemistry: Na+:133, K+: 4.2, BUN/Cr.: 17/0.82, Serum CO2/ AG: 21/13, Alk Phos:146, albumin: 2.6, glucose: 444 ?Hematology: WBC: 7.4, Hgb: 9.2 > 8.8, plt: 380  ?Lactic: >9.0 ?COVID-19 & Influenza A/B: pending ?ABG: 7.38/ 42/ 258/ 24.8 ? ?OR: ?Patient transferred ED to ED then emergently to the OR for left neck exploration due to LEFT carotid artery blow out with active bleed s/p LEFT CEA. She  received 2 additional units of PRBC's, intubated and placed on mechanical ventilatory support. Patient underwent LEFT neck exploration, hemostasis, partial cormatrix excision, primary suture repair of common and internal carotid artery with reconstruction. EBL 350. ? ?PCCM consulted for assistance and further management as patient is intubated requiring mechanical ventilatory support due to LEFT carotid artery rupture. Plan for patient to return to the OR later today on 02/25/22 ? ?Pertinent  Medical History  ?T2DM ?HTN ?Fibromyalgia ?Aortic arch atherosclerosis ?Tobacco abuse ?CAD ?Significant Hospital Events: ?Including procedures, antibiotic start and stop dates in addition to other pertinent events   ?02/25/22: Admit to ICU s/p carotid artery repair in the setting of endarterectomy carotid rupture ? ?Interim History / Subjective:  ?Patient transferred from OR sedated and requiring mechanical ventilatory support. ? ?Objective   ?Height _0  (1.575 m), weight 77.1 kg. ?   ?   ? ?Intake/Output Summary (Last 24 hours) at 02/25/2022 0142 ?Last data filed at 02/25/2022 0137 ?Gross per 24 hour  ?Intake 390 ml  ?Output --  ?Net 390 ml  ? ?Filed Weights  ? 02/25/22 0003  ?Weight: 77.1 kg  ? ? ?Examination: ?General: Adult female, critically ill, lying in bed intubated & sedated requiring mechanical ventilation, NAD ?HEENT: MM pink/moist, anicteric, atraumatic, neck supple ?Neuro: intubated and sedated, unable to follow commands, PERRL +3, MAE ?CV: s1s2 RRR, NSR on monitor, no r/m/g ?Pulm: Regular, non labored on PRVC 50%, PEEP 5, breath sounds coarse-BUL & diminished-BLL ?GI: soft, rounded, bs x 4 ?GU: foley in place with clear yellow urine ?Skin: no rashes/lesions noted ?Extremities: warm/dry, pulses + 2 R/P, no edema  noted ? ?Resolved Hospital Problem list   ? ? ?Assessment & Plan:  ?Ruptured LEFT carotid at previous endarterectomy surgical site s/p repair ?Circulatory Shock- resolved post-op ?Lactic Acidosis in the  setting of hemorrhagic shock ?- plan to return to OR later on 4/17 for additional repair  ?- trend lactic ?- monitor JP drain and site for signs of bleeding, transfuse for Hgb < 7  ?- IVF as needed ?- vascular following, appreciate input ?- continuous cardiac monitoring ?- hold Eliquis & Aspirin until cleared by vascular surgery ?- strict BP control post-op, maintain SBP <120, labetalol PRN- utilize Cardene drip if needed ? ?Post Op Ventilator Management ?PMHx: Tobacco Abuse ?- Ventilator settings: PRVC  8 mL/kg, 50 % FiO2, 5 PEEP, continue ventilator support & lung protective strategies ?- Wean PEEP & FiO2 as tolerated, maintain SpO2 > 90% ?- Head of bed elevated 30 degrees, VAP protocol in place ?- Plateau pressures less than 30 cm H20  ?- Intermittent chest x-ray & ABG PRN ?- Daily WUA with SBT as tolerated  ?- Ensure adequate pulmonary hygiene  ?- F/u cultures, trend PCT ?- Bronchodilators PRN ?- PAD protocol in place: continue Fentanyl drip & Propofol drip > RASS goal: -3 as surgical site fragile ?- reported tracheal deviation at intubation, consider decadron administration if needed in the next couple of days ? ?Poorly controlled Type 2 Diabetes Mellitus ?Hemoglobin A1C: pending (previous 01/24/22: 12.8) ?- initiate insulin drip for glucose control, Monitor CBG per endo tool protocol ?- transition to SSI dosing once appropriate ?- target range while in ICU: 140-180 ?- follow ICU hyper/hypo-glycemia protocol ?- consult Diabetes Coordinator ? ?Suspected Surgical Site Infection ?Patient was given Keflex on 02/22/22 for 3 days, last dose would be 4/17. ?- vancomycin ordered per Dr. Lorenso Courier recommendations ?- daily CBC, monitor WBC/fever trend ?- f/u cultures, trend PCT and lactic ? ?Mild Hyponatremia ?- daily BMP, replace electrolytes PRN ? ?Mild Transaminitis in the setting of hemorrhagic shock ?Alk Phos: 146, AST/ALT: WNL ?- Trend hepatic function ?- hold Lipitor until numbers normalize ?- Consider RUQ US/CT  abdomen/pelvis if needed ?- avoid hepatotoxic agents ? ?Best Practice (right click and "Reselect all SmartList Selections" daily)  ?Diet/type: NPO ?DVT prophylaxis: SCD ?GI prophylaxis: PPI ?Lines: Central line, Arterial Line, and yes and it is still needed ?Foley:  Yes, and it is still needed ?Code Status:  full code ?Last date of multidisciplinary goals of care discussion [02/25/22] ? ?Per Dr. Lorenso Courier, patient was asked if she had any family or contact person she would like notified and she said no. Attempted to reach the spouse listed in [place] but no one answered and there is no voicemail set up. ?Labs   ?CBC: ?Recent Labs  ?Lab 02/24/22 ?2211 02/24/22 ?2216  ?WBC 7.4  --   ?NEUTROABS 2.1  --   ?HGB 9.2* 8.8*  ?HCT 27.0* 26.0*  ?MCV 90.0  --   ?PLT 380  --   ? ? ?Basic Metabolic Panel: ?Recent Labs  ?Lab 02/24/22 ?2211 02/24/22 ?2216  ?NA 133* 132*  ?K 4.2 4.2  ?CL 99 98  ?CO2 21*  --   ?GLUCOSE 444* 441*  ?BUN 17 15  ?CREATININE 0.82 0.60  ?CALCIUM 8.7*  --   ? ?GFR: ?Estimated Creatinine Clearance: 69.2 mL/min (by C-G formula based on SCr of 0.6 mg/dL). ?Recent Labs  ?Lab 02/24/22 ?2211 02/24/22 ?2251  ?WBC 7.4  --   ?LATICACIDVEN  --  >9.0*  ? ? ?Liver Function Tests: ?Recent Labs  ?Lab  02/24/22 ?2211  ?AST 19  ?ALT 17  ?ALKPHOS 146*  ?BILITOT 0.1*  ?PROT 6.4*  ?ALBUMIN 2.6*  ? ?No results for input(s): LIPASE, AMYLASE in the last 168 hours. ?No results for input(s): AMMONIA in the last 168 hours. ? ?ABG ?   ?Component Value Date/Time  ? PHART 7.38 02/25/2022 0105  ? PCO2ART 42 02/25/2022 0105  ? PO2ART 258 (H) 02/25/2022 0105  ? HCO3 24.8 02/25/2022 0105  ? TCO2 22 02/24/2022 2216  ? ACIDBASEDEF 0.4 02/25/2022 0105  ? O2SAT 99.8 02/25/2022 0105  ?  ? ?Coagulation Profile: ?Recent Labs  ?Lab 02/24/22 ?2211  ?INR 1.0  ? ? ?Cardiac Enzymes: ?No results for input(s): CKTOTAL, CKMB, CKMBINDEX, TROPONINI in the last 168 hours. ? ?HbA1C: ?Hgb A1c MFr Bld  ?Date/Time Value Ref Range Status  ?01/24/2022 06:13 PM 12.8 (H)  4.8 - 5.6 % Final  ?  Comment:  ?  (NOTE) ?        Prediabetes: 5.7 - 6.4 ?        Diabetes: >6.4 ?        Glycemic control for adults with diabetes: <7.0 ?  ?06/25/2019 07:20 PM 11.4 (H) 4.8 - 5.6 % Final  ?  Comment:  ?

## 2022-02-25 NOTE — Procedures (Signed)
Central Venous Catheter Insertion Procedure Note ? ?Veronica Murray  ?[id number]  ?08-14-1956 ? ?Date:02/25/22  ?Time:5:35 AM  ? ?Provider Performing:Gavin Faivre L Rust-Chester  ? ?Procedure: Insertion of Non-tunneled Central Venous Catheter(36556) with US guidance (15176)  ? ?Indication(s) ?Medication administration ? ?Consent ?Unable to obtain consent due to emergent nature of procedure. ? ?Anesthesia ?Propofol & fentanyl drips infusing ? ?Timeout ?Verified patient identification, verified procedure, site/side was marked, verified correct patient position, special equipment/implants available, medications/allergies/relevant history reviewed, required imaging and test results available. ? ?Sterile Technique ?Maximal sterile technique including full sterile barrier drape, hand hygiene, sterile gown, sterile gloves, mask, hair covering, sterile ultrasound probe cover (if used). ? ?Procedure Description ?Area of catheter insertion was cleaned with chlorhexidine and draped in sterile fashion.  With real-time ultrasound guidance a central venous catheter was placed into the right femoral vein. Nonpulsatile blood flow and easy flushing noted in all ports.  The catheter was sutured in place and sterile dressing applied. ? ?Complications/Tolerance ?None; patient tolerated the procedure well. ?Chest X-ray is ordered to verify placement for internal jugular or subclavian cannulation.   Chest x-ray is not ordered for femoral cannulation. ? ?EBL ?Minimal ? ?Specimen(s) ?None ? ?Cheryll Cockayne Rust-Chester, AGACNP-BC ?Acute Care Nurse Practitioner ?[facility]  ? ?[phone] / [phone] ?Please see Amion for pager details.  ? ?

## 2022-02-25 NOTE — H&P (Signed)
?[facility] ?Admission History & Physical ? ?MRN : [id number] ? ?Veronica Murray is a 66 y.o. (02-10-1956) female who presents with chief complaint of  ?Chief Complaint  ?Patient presents with  ? Bleeding/Bruising  ?  Post op problem  ?. ? ?History of Present Illness: Patient s/p LEFT CEA on 01/25/2022 at our facility secondary to TIA and high grade stenosis. The patient had been well, presented for follow up without issue. Noted on 02/20/2022 she developed sudden swelling, pain and murky drainage from the incision. She was evaluated by urgent care on 02/22/2022 and it was felt she had a wound infection. She was treated with ABX. Last night the patient developed significant bleeding and hypotension to the 70s. Taken by EMS to [facility], digital pressure held, transfused pRBC. Neurologically intact. Initial request by Forestine Na [facility] as the patient was unstable, with active bleeding. That facility and Vascular surgeon did not accept the patient. I accepted the patient for emergent intervention. Upon arrival EMS was holding pressure at neck incision, the patient was awake, alert and mentating and aware of situation. She Understood significant risks of bleeding, stroke and death. Consent obtained. ? ?Current Facility-Administered Medications  ?Medication Dose Route Frequency Provider Last Rate Last Admin  ? [MAR Hold] docusate (COLACE) 50 MG/5ML liquid 100 mg  100 mg Per Tube BID Rust-Chester, Huel Cote, NP      ? [MAR Hold] fentaNYL (SUBLIMAZE) bolus via infusion 50-100 mcg  50-100 mcg Intravenous Q15 min PRN Rust-Chester, Huel Cote, NP      ? [MAR Hold] fentaNYL (SUBLIMAZE) injection 50 mcg  50 mcg Intravenous Once Rust-Chester, Toribio Harbour L, NP      ? fentaNYL 2563mcg in NS 261mL (34mcg/ml) infusion-PREMIX  50-200 mcg/hr Intravenous Continuous Rust-Chester, Britton L, NP      ? labetalol (NORMODYNE) injection 20 mg  20 mg  Intravenous Once Rust-Chester, Toribio Harbour L, NP      ? pantoprazole sodium (PROTONIX) 40 mg/20 mL oral suspension 40 mg  40 mg Per Tube Daily Rust-Chester, Huel Cote, NP      ? [MAR Hold] polyethylene glycol (MIRALAX / GLYCOLAX) packet 17 g  17 g Per Tube Daily Rust-Chester, Britton L, NP      ? propofol (DIPRIVAN) 1000 MG/100ML infusion  0-50 mcg/kg/min Intravenous Continuous Rust-Chester, Huel Cote, NP      ? ?Facility-Administered Medications Ordered in Other Encounters  ?Medication Dose Route Frequency Provider Last Rate Last Admin  ? 0.9 %  sodium chloride infusion   Intravenous Continuous PRN Esaw Grandchild, CRNA   New Bag at 02/25/22 0012  ? ceFAZolin (ANCEF) IVPB 1 g/50 mL premix   Intravenous Anesthesia Intra-op Mina Marble D, CRNA   2 g at 02/25/22 0045  ? fentaNYL (SUBLIMAZE) injection   Intravenous Anesthesia Intra-op Mina Marble D, CRNA   50 mcg at 02/25/22 [id number]  ? heparin sodium (porcine) injection   Intravenous Anesthesia Intra-op Mina Marble D, CRNA   2,000 Units at 02/25/22 0320  ? ketamine (KETALAR) injection   Intravenous Anesthesia Intra-op Mina Marble D, CRNA   50 mg at 02/25/22 0023  ? lactated ringers infusion   Intravenous Continuous PRN Esaw Grandchild, CRNA   New Bag at 02/25/22 0200  ? midazolam (VERSED) injection   Intravenous Anesthesia Intra-op Mina Marble D, CRNA   2 mg at 02/25/22 0345  ? nitroGLYCERIN (NTG ON-CALL) 0.2 mg/mL injection   Intravenous Anesthesia  Intra-op Mina Marble D, CRNA   100 mcg at 02/25/22 0103  ? norepinephrine (LEVOPHED) 8 mcg/ml (for boluses) - Optime   Intravenous Continuous PRN Mina Marble D, CRNA   1 mL at 02/25/22 0102  ? PHENYLephrine 40 mcg/ml in normal saline Adult IV Push Syringe (For Blood Pressure Support)   Intravenous Anesthesia Intra-op Mina Marble D, CRNA   80 mcg at 02/25/22 0342  ? propofol (DIPRIVAN) 10 mg/mL bolus/IV push   Intravenous Anesthesia Intra-op Mina Marble D, CRNA   40 mg at 02/25/22 0023  ? rocuronium (ZEMURON)  injection   Intravenous Anesthesia Intra-op Mina Marble D, CRNA   100 mg at 02/25/22 0250  ? ? ?Past Medical History:  ?Diagnosis Date  ? Aortic arch atherosclerosis ([place])   ? Diabetes mellitus without complication ([place])   ? Essential hypertension   ? Fibromyalgia   ? Tobacco abuse   ? ? ?Past Surgical History:  ?Procedure Laterality Date  ? CAROTID PTA/STENT INTERVENTION Left 01/24/2022  ? Procedure: CAROTID PTA/STENT INTERVENTION;  Surgeon: Algernon Huxley, MD;  Location: [place] [facility];  Service: Cardiovascular;  Laterality: Left;  ? CERVICAL BIOPSY    ? CHOLECYSTECTOMY    ? ENDARTERECTOMY Left 01/25/2022  ? Procedure: ENDARTERECTOMY CAROTID, possible ligation;  Surgeon: Algernon Huxley, MD;  Location: [facility];  Service: Vascular;  Laterality: Left;  ? HERNIA REPAIR    ? LEFT HEART CATH AND CORONARY ANGIOGRAPHY Left 07/05/2019  ? Procedure: LEFT HEART CATH AND CORONARY ANGIOGRAPHY;  Surgeon: Wellington Hampshire, MD;  Location: [place] [facility];  Service: Cardiovascular;  Laterality: Left;  ? ? ? ?Social History  ? ?Tobacco Use  ? Smoking status: Every Day  ?  Packs/day: 1.50  ?  Years: 50.00  ?  Pack years: 75.00  ?  Types: Cigarettes  ? Smokeless tobacco: Never  ?Substance Use Topics  ? Alcohol use: Not Currently  ? Drug use: Yes  ?  Frequency: 7.0 times per week  ?  Types: Marijuana  ? ? ? ?Family History  ?Problem Relation Age of Onset  ? CAD Neg Hx   ? ? ?Allergies  ?Allergen Reactions  ? Codeine Hives, Shortness Of Breath and Nausea Only  ? Toradol [Ketorolac Tromethamine] Hives, Shortness Of Breath and Nausea Only  ? Tramadol Hcl Shortness Of Breath and Nausea Only  ?  "Conflicts with bipolar condition"  ? ? ? ?REVIEW OF SYSTEMS (Negative unless checked) ? ?Constitutional: [] Weight loss  [] Fever  [] Chills ?Cardiac: [] Chest pain   [] Chest pressure   [] Palpitations   [] Shortness of breath when laying flat   [] Shortness of breath at rest   [] Shortness of breath with exertion. ?Vascular:  [] Pain in legs with  walking   [] Pain in legs at rest   [] Pain in legs when laying flat   [] Claudication   [] Pain in feet when walking  [] Pain in feet at rest  [] Pain in feet when laying flat   [] History of DVT   [] Phlebitis   [] Swelling in legs   [] Varicose veins   [] Non-healing ulcers ?Pulmonary:   [] Uses home oxygen   [] Productive cough   [] Hemoptysis   [] Wheeze  [] COPD   [] Asthma ?Neurologic:  [] Dizziness  [] Blackouts   [] Seizures   [] History of stroke   [] History of TIA  [] Aphasia   [] Temporary blindness   [] Dysphagia   [] Weakness or numbness in arms   [] Weakness or numbness in legs ?Musculoskeletal:  [] Arthritis   [] Joint swelling   [] Joint  pain   [] Low back pain ?Hematologic:  [] Easy bruising  [] Easy bleeding   [] Hypercoagulable state   [] Anemic  [] Hepatitis ?Gastrointestinal:  [] Blood in stool   [] Vomiting blood  [] Gastroesophageal reflux/heartburn   [] Difficulty swallowing. ?Genitourinary:  [] Chronic kidney disease   [] Difficult urination  [] Frequent urination  [] Burning with urination   [] Blood in urine ?Skin:  [] Rashes   [] Ulcers   [] Wounds ?Psychological:  [] History of anxiety   []  History of major depression. ? ?Physical Examination ? ?Vitals:  ? 02/25/22 0003  ?Weight: 77.1 kg  ?Height: 5\' 2"  (1.575 m)  ? ?Body mass index is 31.09 kg/m?. ?Gen: WD/WN, NAD ?Head: [place]/AT, No temporalis wasting. Prominent temp pulse not noted. ?Ear/Nose/Throat: Hearing grossly intact, nares w/o erythema or drainage, oropharynx w/o Erythema/Exudate,  ?Eyes: Conjunctiva clear, sclera non-icteric ?Neck: Tracheal deviation, LEFT Neck- swelling, bleeding from incision-controlled with manual pressure ?Pulmonary:  Good air movement, respirations not labored, no use of accessory muscles.  ?Cardiac: RRR, normal S1, S2. ?Vascular:  ?Vessel Right Left  ?Radial Palpable Palpable  ?    ?    ?    ?    ?    ?    ?PT Palpable Palpable  ?DP Palpable Palpable  ? ?Gastrointestinal: soft, non-tender/non-distended. No guarding/reflex.  ?Musculoskeletal: M/S 5/5  throughout.  Extremities without ischemic changes.  No deformity or atrophy.  ?Neurologic: Sensation grossly intact in extremities.  Symmetrical.  Speech is fluent. Motor exam as listed above. ?Psychiatric: Jud

## 2022-02-25 NOTE — Consult Note (Signed)
Pharmacy Antibiotic Note ? ?Veronica Murray is a 66 y.o. female admitted on 02/24/2022 with  wound infection . PMH includes aortic arch atherosclerosis, diabetes mellitus, HTN. Recent left CEA on 3/17. On 4/12 noticed with sudden swelling, and drainage from incision. Pharmacy has been consulted for Vancomycin dosing.  ? ?Renal function stable and consistent with baseline. On day 2 of vancomycin.  ? ?Plan: Continue vancomycin 1000 mg IV Q 24 hrs. ?Goal AUC 400-550. ?Expected AUC: 502.2, Cmin 11.1 mcg/ml ?SCr used: 0.8, Vd used: 0.5 L/kg (BMI > 30) ? ?Height: 5\' 2"  (157.5 cm) ?Weight: 77.1 kg (170 lb) ?IBW/kg (Calculated) : 50.1 ? ?Temp (24hrs), Avg:97.4 ?F (36.3 ?C), Min:97.3 ?F (36.3 ?C), Max:97.5 ?F (36.4 ?C) ? ?Recent Labs  ?Lab 02/24/22 ?2211 02/24/22 ?2216 02/24/22 ?2251 02/25/22 ?0443  ?WBC 7.4  --   --  10.5  ?CREATININE 0.82 0.60  --  0.58  ?LATICACIDVEN  --   --  >9.0* 0.8  ? ?  ?Estimated Creatinine Clearance: 69.2 mL/min (by C-G formula based on SCr of 0.58 mg/dL).   ? ?Allergies  ?Allergen Reactions  ? Codeine Hives, Shortness Of Breath and Nausea Only  ? Toradol [Ketorolac Tromethamine] Hives, Shortness Of Breath and Nausea Only  ? Tramadol Hcl Shortness Of Breath and Nausea Only  ?  "Conflicts with bipolar condition"  ? ? ?Antimicrobials this admission: ?4/17 Vancomycin >>  ? ?Dose adjustments this admission: none ? ?Microbiology results: ?4/17 MRSA PCR: not detected ?4/17: Wound culture: in process ? ?Thank you for allowing pharmacy to be a part of this patient?s care. ? ?5/17, PharmD ?Pharmacy Resident  ?02/25/2022 ?8:44 AM ?

## 2022-02-25 NOTE — ED Provider Notes (Signed)
? ?[facility] ?Provider Note ? ? ? Event Date/Time  ? First MD Initiated Contact with Patient 02/25/22 0000   ?  (approximate) ? ? ?History  ? ?Bleeding/Bruising (Post op problem) ? ?Level 5 caveat:  history/ROS limited by acute/critical illness ? ?HPI ? ?Veronica Murray is a 66 y.o. female status post carotid endarterectomy by Dr. Leotis Pain on 01/25/2022.  She arrives by EMS as a transfer from [facility] emergency department requiring emergent vascular surgical intervention. ? ?The patient arrived to [facility] emergency department bleeding from the left carotid artery wound which had reportedly opened and began bleeding briskly at home.  The patient was hypotensive with systolic blood pressures in the 70s upon arrival.  She reportedly takes Eliquis. ? ?The patient received 2 units of emergency blood at [facility] and the bleeding was controlled with direct pressure.  The ED physician, Dr. Alvino Chapel, spoke with the [facility] vascular surgeon, Dr. Scot Dock, who reportedly declined to see the patient and insisted on transfer to [facility] because the patient had originally had surgery at this facility.  Dr. Alvino Chapel then spoke with Dr. Lorenso Courier, the on-call vascular surgeon at [facility].  There was reportedly an additional conversation between Dr. Alvino Chapel and Dr. Scot Dock, as well as a direct conversation between the 2 vascular surgeon, and Dr. Scot Dock continued to decline to see the patient.  In the meantime the patient began to bleed again and was continuing to require direct pressure.  Dr. Lorenso Courier accepted the patient given that Ms. Bivens required emergent intervention.   ? ?I spoke by phone with Dr. Alvino Chapel and he explained the situation.  He also let me know that Dr. Lorenso Courier had requested that reversal agents for Eliquis not be provided. ? ?Given the confusion of the situation and the need to get the patient to [facility] emergently and as safely as possible under the circumstances, I authorized the patient's  arrival to the [facility] ED and contacted Dr. Lorenso Courier to meet [place].  ED charge RN Jinny Blossom also was in communication with [facility]) and the pre-operative team. ?  ? ? ?Physical Exam  ? ?Triage Vital Signs: ?ED Triage Vitals [02/25/22 0003]  ?Enc Vitals Group  ?   BP   ?   Pulse   ?   Resp   ?   Temp   ?   Temp src   ?   SpO2   ?   Weight 77.1 kg (170 lb)  ?   Height 1.575 m (_0 )  ?   Head Circumference   ?   Peak Flow   ?   Pain Score   ?   Pain Loc   ?   Pain Edu?   ?   Excl. in [place]?   ? ? ?Most recent vital signs: ?There were no vitals filed for this visit. ? ? ?General: Awake, alert, reports she is uncomfortable but is not in severe distress. ?CV:  Good peripheral perfusion.  Palpable left radial pulse. ?Resp:  Normal effort.  Speaking clearly, no accessory muscle usage, no wheezing. ?Abd:  No distention.  ?Other:  Direct pressure is still being applied to the left neck wound, and given the history of brisk arterial bleed, I did not remove the pressure nor the gauze in order to visualize the wound. ? ? ?ED Results / Procedures / Treatments  ? ?Labs ?(all labs ordered are listed, but only abnormal results are displayed) ?Labs Reviewed  ?BLOOD GAS, ARTERIAL -  Abnormal; Notable for the following components:  ?    Result Value  ? pO2, Arterial 258 (*)   ? All other components within normal limits  ?MASSIVE TRANSFUSION PROTOCOL ORDER (BLOOD BANK NOTIFICATION)  ? ? ? ?PROCEDURES: ? ?Critical Care performed: Yes, see critical care procedure note(s) ? ?.Critical Care ?Performed by: Hinda Kehr, MD ?Authorized by: Hinda Kehr, MD  ? ?Critical care provider statement:  ?  Critical care time (minutes):  5 ?  Critical care time was exclusive of:  Separately billable procedures and treating other patients ?  Critical care was time spent personally by me on the following activities:  Development of treatment plan with patient or surrogate, evaluation of patient's response to treatment, examination of patient, obtaining history  from patient or surrogate, ordering and performing treatments and interventions, ordering and review of laboratory studies, ordering and review of radiographic studies, pulse oximetry, re-evaluation of patient's condition and review of old charts ? ? ?MEDICATIONS ORDERED IN ED: ?Medications - No data to display ? ? ?IMPRESSION / MDM / ASSESSMENT AND PLAN / ED COURSE  ?I reviewed the triage vital signs and the nursing notes. ?             ?               ? ?Differential diagnosis includes, but is not limited to, postoperative wound dehiscence and bleed of carotid artery, hemorrhagic shock, acute blood loss anemia. ? ?Patient awake and alert, does not need airway protection upon arrival in the ED.  I met the patient as she arrived by EMS along with Dr. Lorenso Courier (vascular surgery).  We determined immediately that the patient was stable for transport upstairs to the perioperative area and Dr. Lorenso Courier went upstairs with the patient and with staff.  IV had already been established.  Given the emergent nature of the situation, we did not get a set of vitals in the emergency department, but her vital signs within minutes of arrival by EMS were a BP of 130s/70s and a heart rate in the 60s, which I verified by palpating her left radial pulse.  The patient has received 3 units of blood with the last route being administered en route to [facility]. ? ?Patient was appropriate for emergent transfer to the operating room under the care of Dr. Lorenso Courier. ? ? ?  ? ?Overall ?FINAL CLINICAL IMPRESSION(S) / ED DIAGNOSES  ? ?Final diagnoses:  ?Status post carotid endarterectomy  ?Acute blood loss anemia  ? ? ? ?Rx / DC Orders  ? ?ED Discharge Orders   ? ? None  ? ?  ? ? ? ?Note:  This document was prepared using Dragon voice recognition software and may include unintentional dictation errors. ?  ?Hinda Kehr, MD ?02/25/22 [phone] ? ?

## 2022-02-26 ENCOUNTER — Inpatient Hospital Stay: Payer: Self-pay

## 2022-02-26 DIAGNOSIS — R571 Hypovolemic shock: Secondary | ICD-10-CM | POA: Diagnosis not present

## 2022-02-26 LAB — BASIC METABOLIC PANEL
Anion gap: 6 (ref 5–15)
Anion gap: 8 (ref 5–15)
BUN: 18 mg/dL (ref 8–23)
BUN: 20 mg/dL (ref 8–23)
CO2: 25 mmol/L (ref 22–32)
CO2: 25 mmol/L (ref 22–32)
Calcium: 7.8 mg/dL — ABNORMAL LOW (ref 8.9–10.3)
Calcium: 7.9 mg/dL — ABNORMAL LOW (ref 8.9–10.3)
Chloride: 102 mmol/L (ref 98–111)
Chloride: 103 mmol/L (ref 98–111)
Creatinine, Ser: 0.7 mg/dL (ref 0.44–1.00)
Creatinine, Ser: 0.79 mg/dL (ref 0.44–1.00)
GFR, Estimated: >60 mL/min (ref >60)
GFR, Estimated: >60 mL/min (ref >60)
Glucose, Bld: 149 mg/dL — ABNORMAL HIGH (ref 70–99)
Glucose, Bld: 200 mg/dL — ABNORMAL HIGH (ref 70–99)
Potassium: 3.9 mmol/L (ref 3.5–5.1)
Potassium: 3.9 mmol/L (ref 3.5–5.1)
Sodium: 134 mmol/L — ABNORMAL LOW (ref 135–145)
Sodium: 135 mmol/L (ref 135–145)

## 2022-02-26 LAB — MAGNESIUM: Magnesium: 2.4 mg/dL (ref 1.7–2.4)

## 2022-02-26 LAB — CBC WITH DIFFERENTIAL/PLATELET
Abs Immature Granulocytes: 0.07 10*3/uL (ref 0.00–0.07)
Basophils Absolute: 0 10*3/uL (ref 0.0–0.1)
Basophils Relative: 0 %
Eosinophils Absolute: 0.1 10*3/uL (ref 0.0–0.5)
Eosinophils Relative: 0 %
HCT: 26.2 % — ABNORMAL LOW (ref 36.0–46.0)
Hemoglobin: 8.8 g/dL — ABNORMAL LOW (ref 12.0–15.0)
Immature Granulocytes: 1 %
Lymphocytes Relative: 27 %
Lymphs Abs: 3.2 10*3/uL (ref 0.7–4.0)
MCH: 29.3 pg (ref 26.0–34.0)
MCHC: 33.6 g/dL (ref 30.0–36.0)
MCV: 87.3 fL (ref 80.0–100.0)
Monocytes Absolute: 0.8 10*3/uL (ref 0.1–1.0)
Monocytes Relative: 7 %
Neutro Abs: 7.7 10*3/uL (ref 1.7–7.7)
Neutrophils Relative %: 65 %
Platelets: 274 10*3/uL (ref 150–400)
RBC: 3 MIL/uL — ABNORMAL LOW (ref 3.87–5.11)
RDW: 13.8 % (ref 11.5–15.5)
WBC: 11.8 10*3/uL — ABNORMAL HIGH (ref 4.0–10.5)
nRBC: 0 % (ref 0.0–0.2)

## 2022-02-26 LAB — GLUCOSE, CAPILLARY
Glucose-Capillary: 160 mg/dL — ABNORMAL HIGH (ref 70–99)
Glucose-Capillary: 178 mg/dL — ABNORMAL HIGH (ref 70–99)
Glucose-Capillary: 179 mg/dL — ABNORMAL HIGH (ref 70–99)
Glucose-Capillary: 206 mg/dL — ABNORMAL HIGH (ref 70–99)
Glucose-Capillary: 240 mg/dL — ABNORMAL HIGH (ref 70–99)
Glucose-Capillary: 253 mg/dL — ABNORMAL HIGH (ref 70–99)

## 2022-02-26 LAB — PROCALCITONIN: Procalcitonin: <0.1 ng/mL

## 2022-02-26 LAB — PHOSPHORUS: Phosphorus: 4.9 mg/dL — ABNORMAL HIGH (ref 2.5–4.6)

## 2022-02-26 LAB — ABO/RH: ABO/RH(D): O POS

## 2022-02-26 LAB — MASSIVE TRANSFUSION PROTOCOL ORDER (BLOOD BANK NOTIFICATION)

## 2022-02-26 LAB — TRIGLYCERIDES: Triglycerides: 204 mg/dL — ABNORMAL HIGH (ref <150)

## 2022-02-26 MED ORDER — CHLORHEXIDINE GLUCONATE 0.12% ORAL RINSE (MEDLINE KIT)
15.0000 mL | Freq: Two times a day (BID) | OROMUCOSAL | Status: DC
  Administered 2022-02-26 – 2022-03-04 (×12): 15 mL via OROMUCOSAL

## 2022-02-26 MED ORDER — VITAL HIGH PROTEIN PO LIQD
1000.0000 mL | ORAL | Status: DC
  Administered 2022-02-27: 1000 mL

## 2022-02-26 MED ORDER — ORAL CARE MOUTH RINSE
15.0000 mL | OROMUCOSAL | Status: DC
  Administered 2022-02-26 – 2022-03-04 (×58): 15 mL via OROMUCOSAL

## 2022-02-26 MED ORDER — SODIUM CHLORIDE 0.9% FLUSH: 10.0000 mL | INTRAVENOUS | Status: DC | PRN

## 2022-02-26 MED ORDER — METRONIDAZOLE 500 MG/100ML IV SOLN
500.0000 mg | Freq: Three times a day (TID) | INTRAVENOUS | Status: DC
  Administered 2022-02-26 – 2022-02-28 (×6): 500 mg via INTRAVENOUS
  Filled 2022-02-26 (×7): qty 100

## 2022-02-26 MED ORDER — SODIUM CHLORIDE 0.9 % IV SOLN: INTRAVENOUS | Status: DC | PRN

## 2022-02-26 MED ORDER — PROSOURCE TF PO LIQD
45.0000 mL | Freq: Two times a day (BID) | ORAL | Status: DC
  Administered 2022-02-26 – 2022-02-27 (×2): 45 mL
  Filled 2022-02-26 (×2): qty 45

## 2022-02-26 MED ORDER — SODIUM CHLORIDE 0.9% FLUSH
10.0000 mL | Freq: Two times a day (BID) | INTRAVENOUS | Status: DC
  Administered 2022-02-26 – 2022-03-03 (×10): 10 mL
  Administered 2022-03-03: 20 mL
  Administered 2022-03-04 – 2022-03-15 (×17): 10 mL

## 2022-02-26 NOTE — Progress Notes (Signed)
Peripherally Inserted Central Catheter Placement ? ?The IV Nurse has discussed with the patient and/or persons authorized to consent for the patient, the purpose of this procedure and the potential benefits and risks involved with this procedure.  The benefits include less needle sticks, lab draws from the catheter, and the patient may be discharged home with the catheter. Risks include, but not limited to, infection, bleeding, blood clot (thrombus formation), and puncture of an artery; nerve damage and irregular heartbeat and possibility to perform a PICC exchange if needed/ordered by physician.  Alternatives to this procedure were also discussed.  Bard Power PICC patient education guide, fact sheet on infection prevention and patient information card has been provided to patient /or left at bedside.   ? ?PICC Placement Documentation  ?PICC Triple Lumen 02/26/22 Left Brachial 44 cm 0 cm (Active)  ?Indication for Insertion or Continuance of Line Vasoactive infusions 02/26/22 1403  ?Exposed Catheter (cm) 2 cm 02/26/22 1403  ?Site Assessment Clean, Dry, Intact 02/26/22 1403  ?Lumen #1 Status Flushed;Blood return noted;Saline locked 02/26/22 1403  ?Lumen #2 Status Flushed;Blood return noted;Saline locked 02/26/22 1403  ?Lumen #3 Status Flushed;Blood return noted;Saline locked 02/26/22 1403  ?Dressing Type Transparent 02/26/22 1403  ?Dressing Status Antimicrobial disc in place 02/26/22 1403  ?Dressing Change Due 03/05/22 02/26/22 1403  ? ? ? ? ? ?Jule Economy Horton ?02/26/2022, 2:04 PM ? ?

## 2022-02-26 NOTE — Progress Notes (Signed)
PHARMACY CONSULT NOTE - FOLLOW UP ? ?Pharmacy Consult for Electrolyte Monitoring and Replacement  ? ?Recent Labs: ?Potassium (mmol/L)  ?Date Value  ?02/26/2022 3.9  ? ?Magnesium (mg/dL)  ?Date Value  ?02/26/2022 2.4  ? ?Calcium (mg/dL)  ?Date Value  ?02/26/2022 7.9 (L)  ? ?Albumin (g/dL)  ?Date Value  ?02/25/2022 2.3 (L)  ? ?Phosphorus (mg/dL)  ?Date Value  ?02/26/2022 4.9 (H)  ? ?Sodium (mmol/L)  ?Date Value  ?02/26/2022 135  ?Corrected Calcium: 9.26 mg/dl ? ?Assessment: ?66 year old female presenting with ruptured carotic endarterectomy wound. 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 Patient is intubated and sedated in the ICU. Pharmacy has been consulted to manage electrolytes. ? ?Renal function consistent with baseline. ? ?On propofol infusion and fentanyl boluses. Norepinephrine at low dose to maintain MAPs. Tube feeds ongoing at 45 ml/hr with basal and bolus insulin.  ? ?Goal of Therapy:  ?Electrolytes within normal limits ? ?Plan:  ?No electrolyte replacement warranted ?Follow up electrolytes in the AM ? ? ?Jaynie Bream, PharmD ?Pharmacy Resident  ?02/26/2022 ?12:39 PM ? ? ?

## 2022-02-26 NOTE — Progress Notes (Signed)
? ?NAME:  Veronica Murray, MRN:  [id number], DOB:  Nov 08, 1956, LOS: 1 ?ADMISSION DATE:  02/24/2022 ? ?History of Present Illness:  ?66 yo F transferred emergently to [facility] ED from [facility] ED for emergent vascular intervention on a ruptured carotid endarterectomy wound. History obtained from Epic documentation, patient intubated and sedated.  ?01/24/22 Patient underwent LEFT endarterectomy on at [facility] with Dr. Lucky Cowboy without complication.  ?02/01/22 Follow up appointment on  revealed no complications.   ?02/22/22 Patient visited an urgent care onwith concern for infected endarterectomy site, picture in epic shows redness, edema and drainage around healing surgical site. She was hemodynamically stable and was started on a 3 day course of Keflex, instructed to f/u with provider. ? 02/24/22 patient brought in via EMS to [facility] ED with reports that surgical site burst open and began bleeding heavily. Per [facility] documentation patient was grey in color on arrival, unable to speak but one word at a time, EMS reporting hypotension with SBP in the 70's on arrival to [facility] ED. ?Patient was transferred to [facility] for emergent vascular intervention. ?ED course: ?At [facility] patient received 3 units of PRBC's & bolus of NS. In transport patient received fentanyl & zofran. Upon arrival at [facility] she was taken immediately to the OR for vascular intervention. ?  ?OR: ?Patient transferred ED to ED then emergently to the OR for left neck exploration due to LEFT carotid artery blow out with active bleed s/p LEFT CEA. She received 2 additional units of PRBC's, intubated and placed on mechanical ventilatory support. Patient underwent LEFT neck exploration, hemostasis, partial cormatrix excision, primary suture repair of common and internal carotid artery with reconstruction. EBL 350. ? ICU ?PCCM consulted for assistance and further management as patient is intubated requiring mechanical ventilatory support due to LEFT carotid artery rupture. ?  ? ?Significant  Hospital Events: ?Including procedures, antibiotic start and stop dates in addition to other pertinent events   ?02/25/22: Admit to ICU s/p carotid artery repair in the setting of endarterectomy carotid rupture ?4/18 plan for care discussed with surgery, patient remains intubated, left carotid artery disintegrated possible due to infection, remains on vent ?  ? ? ? ? ? ?Interim History / Subjective:  ?Remains intubated ?Remains critically ill ?On pressors ?Plan of care per vasc surgery ? ? ? ?Antimicrobials:  ? ?Antibiotics Given (last 72 hours)   ? ? Date/Time Action Medication Dose Rate  ? 02/25/22 0640 New Bag/Given  ? vancomycin (VANCOREADY) IVPB 1750 mg/350 mL 1,750 mg 175 mL/hr  ? 02/26/22 0547 New Bag/Given  ? vancomycin (VANCOCIN) IVPB 1000 mg/200 mL premix 1,000 mg 200 mL/hr  ? ?  ? ? ? ? ? ? ? ?Objective   ?Blood pressure (!) 90/42, pulse 61, temperature (!) 97.5 ?F (36.4 ?C), resp. rate 17, height 5' 2" (1.575 m), weight 77.1 kg, SpO2 99 %. ?CVP:  [0 mmHg-22 mmHg] 9 mmHg  ?Vent Mode: PCV ?FiO2 (%):  [21 %-50 %] 28 % ?Set Rate:  [12 bmp] 12 bmp ?Vt Set:  [400 mL] 400 mL ?PEEP:  [5 cmH20] 5 cmH20 ?Plateau Pressure:  [11 cmH20-18 cmH20] 14 cmH20  ? ?Intake/Output Summary (Last 24 hours) at 02/26/2022 0726 ?Last data filed at 02/26/2022 0600 ?Gross per 24 hour  ?Intake 3932.61 ml  ?Output 745 ml  ?Net 3187.61 ml  ? ?Filed Weights  ? 02/25/22 0003  ?Weight: 77.1 kg  ? ? ?REVIEW OF SYSTEMS ? ?PATIENT IS UNABLE TO PROVIDE COMPLETE REVIEW OF SYSTEMS DUE TO SEVERE  CRITICAL ILLNESS AND TOXIC METABOLIC ENCEPHALOPATHY ? ? ? ?PHYSICAL EXAMINATION: ? ?GENERAL:critically ill appearing, +resp distress ?EYES: Pupils equal, round, reactive to light.  No scleral icterus.  ?MOUTH: Moist mucosal membrane. INTUBATED ?NECK: Supple, dressing intact with JP drain  ?PULMONARY: +rhonchi,  ?CARDIOVASCULAR: S1 and S2.  No murmurs  ?GASTROINTESTINAL: Soft, nontender, -distended. Positive bowel sounds.  ?MUSCULOSKELETAL: No swelling,  clubbing, or edema.  ?NEUROLOGIC: obtunded ?SKIN:intact,warm,dry ? ? ? ? ? ?Labs/imaging that I havepersonally reviewed  ?(right click and "Reselect all SmartList Selections" daily)  ? ? ? ?ASSESSMENT AND PLAN ?SYNOPSIS ? ?67 yo WF Ruptured LEFT carotid at previous endarterectomy surgical site s/p repair ?With Circulatory Shock-hypovolumic and septic shock POA ?Lactic Acidosis in the setting of hemorrhagic/septic shock post op resp failure with tracheal deviation  ? ?Ruptured LEFT carotid at previous endarterectomy surgical site s/p repair ?Await further instructions from vasc surgery ?Possible Plan for re-assessment in OR in n ext 24-48 hrs ? ? ? ?Severe ACUTE Hypoxic and Hypercapnic Respiratory Failure ?-continue Mechanical Ventilator support ?-Wean Fio2 and PEEP as tolerated ?-VAP/VENT bundle implementation ?- Wean PEEP & FiO2 as tolerated, maintain SpO2 > 88% ?- Head of bed elevated 30 degrees, VAP protocol in place ?- Plateau pressures less than 30 cm H20  ?- Intermittent chest x-ray & ABG PRN ?- Ensure adequate pulmonary hygiene  ?-will NOT perform SAT/SBT when respiratory parameters are met ? ?Vent Mode: PCV ?FiO2 (%):  [21 %-50 %] 28 % ?Set Rate:  [12 bmp] 12 bmp ?Vt Set:  [400 mL] 400 mL ?PEEP:  [5 cmH20] 5 cmH20 ?Plateau Pressure:  [11 cmH20-18 cmH20] 14 cmH20 ? ? COPD  ?-continue NEB THERAPY as prescribed ?-morphine as needed ?-wean fio2 as needed and tolerated ? ?INFECTIOUS DISEASE ?-continue antibiotics as prescribed ?-follow up cultures ?Suspected Surgical Site Infection ?Patient was given Keflex on 02/22/22 for 3 days, last dose would be 4/17. ?- vancomycin ordered per Dr. Lorenso Courier recommendations ?- daily CBC, monitor WBC/fever trend ?- f/u cultures, trend PCT and lactic ? ? ?CARDIAC ?ICU monitoring ? ? ?KIDNEYS ?-continue Foley Catheter-assess need ?-Avoid nephrotoxic agents ?-Follow urine output, BMP ?-Ensure adequate renal perfusion, optimize oxygenation ?-Renal dose medications ? ? ?Intake/Output  Summary (Last 24 hours) at 02/26/2022 0726 ?Last data filed at 02/26/2022 0600 ?Gross per 24 hour  ?Intake 3932.61 ml  ?Output 745 ml  ?Net 3187.61 ml  ? ? ? ?NEUROLOGY ?Acute toxic metabolic encephalopathy, need for sedation ?Goal RASS -2 to -3 ? ? ?SEPTIC SHOCK ?SOURCE-LEFT carotid infection ?-use vasopressors to keep MAP>65 as needed ?-follow ABG and LA ?-follow up cultures ?-emperic ABX ?-aggressive IV fluid resuscitation ? ?ENDO ?- ICU hypoglycemic\Hyperglycemia protocol ?-check FSBS per protocol ? ? ?GI ?GI PROPHYLAXIS as indicated ? ?NUTRITIONAL STATUS ?DIET-->TF's as tolerated ?Constipation protocol as indicated ? ? ?ELECTROLYTES ?-follow labs as needed ?-replace as needed ?-pharmacy consultation and following ? ? ? ? ?Best practice (right click and "Reselect all SmartList Selections" daily)  ?Diet:  NPO ?Pain/Anxiety/Delirium protocol (if indicated): Yes (RASS goal -2) ?VAP protocol (if indicated): Yes ?DVT prophylaxis: Contraindicated ?GI prophylaxis: PPI ?Glucose control:  SSI Yes ?Central venous access:  Yes, and it is still needed ?Arterial line:  Yes, and it is still needed ?Foley:  Yes, and it is still needed ?Mobility:  bed rest  ?Code Status:  FULL CODE ?Disposition: ICU ? ?Labs   ?CBC: ?Recent Labs  ?Lab 02/24/22 ?2211 02/24/22 ?2216 02/25/22 ?0443 02/25/22 ?1055 02/26/22 ?4235  ?WBC 7.4  --  10.5  --  11.8*  ?NEUTROABS 2.1  --  8.1*  --  7.7  ?HGB 9.2* 8.8* 11.5* 10.3* 8.8*  ?HCT 27.0* 26.0* 34.0* 30.9* 26.2*  ?MCV 90.0  --  87.6  --  87.3  ?PLT 380  --  238  --  274  ? ? ?Basic Metabolic Panel: ?Recent Labs  ?Lab 02/25/22 ?0443 02/25/22 ?1013 02/25/22 ?1634 02/25/22 ?1653 02/25/22 ?2347 02/26/22 ?1245  ?NA 132* 132* 136  --  134* 135  ?K 5.2* 3.8 4.0  --  3.9 3.9  ?CL 98 103 102  --  103 102  ?CO2 _0 --  25 25  ?GLUCOSE 490* 284* 114*  --  149* 200*  ?BUN _1 --  18 20  ?CREATININE 0.58 0.74 0.67  --  0.79 0.70  ?CALCIUM 7.8* 7.6* 8.2*  --  7.8* 7.9*  ?MG 1.4*  --   --  2.4  --  2.4   ?PHOS 4.5  --   --   --   --  4.9*  ? ?GFR: ?Estimated Creatinine Clearance: 69.2 mL/min (by C-G formula based on SCr of 0.7 mg/dL). ?Recent Labs  ?Lab 02/24/22 ?2211 02/24/22 ?2251 02/25/22 ?0443 02/26/22 ?8099  ?

## 2022-02-26 NOTE — Progress Notes (Signed)
Nutrition Follow-up ? ?DOCUMENTATION CODES:  ? ?Obesity unspecified ? ?INTERVENTION:  ? ?Continue TF via OGT:  ? ?Vital High Protein @ 40 ml/hr ? ?45 ml Prosource TF BID.   ? ?30 ml free water flush every 4 hours to maintain tube patency ? ?Tube feeding regimen provides 1040 kcals, 106 grams of protein, and 803 ml of H2O. Total free water: 983 ml daily ? ?-Continue liquid MVI daily via tube  ? ?NUTRITION DIAGNOSIS:  ? ?Inadequate oral intake related to inability to eat (pt sedated and ventilated) as evidenced by NPO status. ? ?Ongoing ? ?GOAL:  ? ?Provide needs based on ASPEN/SCCM guidelines ? ?Met with TF ? ?MONITOR:  ? ?Vent status, Labs, Weight trends, Skin, I & O's ? ?REASON FOR ASSESSMENT:  ? ?Malnutrition Screening Tool, Ventilator ?  ? ?ASSESSMENT:  ? 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 ? ?Patient is currently intubated on ventilator support ?MV: 5.6 L/min ?Temp (24hrs), Avg:97.7 ?F (36.5 ?C), Min:94.6 ?F (34.8 ?C), Max:98.1 ?F (36.7 ?C) ? ?Propofol: 23.1 ml/hr (provides 610 kcals daily) ? ?Case discussed with MD, RN, and during ICU rounds. Pt tolerating TF well. Plan for PICC line placement secondary to long term IV antibiotics.  ? ?Noted Vital High Protein infusing via OGT at 35 ml/hr.  ? ?Medications reviewed and include colace, miralax, and levophed. ? ?Labs reviewed: CBGS: 147-253 (inpatient orders for glycemic control are 2-6 units insulin aspart every 4 hours and 26 units insulin determir every 12 hours).   ? ?Diet Order:   ?Diet Order   ? ?       ?  Diet NPO time specified  Diet effective now       ?  ? ?  ?  ? ?  ? ? ?EDUCATION NEEDS:  ? ?No education needs have been identified  at this time ? ?Skin:  Skin Assessment: Skin Integrity Issues: ?Skin Integrity Issues:: Incisions ?Incisions: closed lt neck ? ?Last BM:  02/26/22 ? ?Height:  ? ?Ht Readings from Last 1 Encounters:  ?02/25/22 $RemoveBe'5\' 2"'AjyLUYGTe$  (1.575 m)  ? ? ?Weight:  ? ?Wt Readings from Last 1 Encounters:  ?02/25/22 77.1 kg  ? ? ?Ideal Body Weight:  50 kg ? ?BMI:  Body mass index is 31.09 kg/m?. ? ?Estimated Nutritional Needs:  ? ?Kcal:  1390kcal/day ? ?Protein:  >100g/day ? ?Fluid:  1.5-1.8L/day ? ? ? ?Loistine Chance, RD, LDN, CDCES ?Registered Dietitian II ?Certified Diabetes Care and Education Specialist ?Please refer to [facility] for RD and/or RD on-call/weekend/after hours pager  ?

## 2022-02-26 NOTE — Plan of Care (Signed)
Patient is sedated and intubated per vascular surgery; maintaining SBP less than 120. Receiving Propofol (50 mcg/kg/min) and Fentanyl (200 mcg/hr). Received bolus fentanyl 50 mcg x 2 today, for agitation, responds to pain with interventions such as turning, mouthcare etc. Moves all extremities, and reaches for ETT when awake, opens eyes. Unsuccessful wean of Levophed, decreased from 3 mcg/min, requiring 1 mcg/min to maintain MAPS. PICC line placed in left forearm and right femoral triple lumen catheter removed without bleeding or hematoma. Right radial arterial line intermittently has poor waveform, and very positional. Cuff BP's correlate well. See flowsheet. Tolerating ventilator well, with sats 99-100 on 28%. Skin intact. Receiving antibiotics; Metronidazole initiated.  ?No bleeding from left neck dressing; dressing CDI. Bulb drain with 15 cc out during day shift, sanguinous drainage.  ? ?Problem: Cardiovascular: ?Goal: Ability to achieve and maintain adequate cardiovascular perfusion will improve ?Outcome: Progressing ?Goal: Vascular access site(s) Level 0-1 will be maintained ?Outcome: Progressing ?  ?Problem: Cardiovascular: ?Goal: Ability to achieve and maintain adequate cardiovascular perfusion will improve ?Outcome: Progressing ?  ?Problem: Cardiovascular: ?Goal: Vascular access site(s) Level 0-1 will be maintained ?Outcome: Progressing ?  ?

## 2022-02-26 NOTE — Progress Notes (Signed)
1 Day Post-Op  ? ?Subjective/Chief Complaint: ?Remains Intubate, sedated. On vasopressor for hypotension. Was moving all extremities yesterday when sedation lightened. Otherwise stable. ? ? ?Objective: ?Vital signs in last 24 hours: ?Temp:  [94.6 ?F (34.8 ?C)-98.1 ?F (36.7 ?C)] 97.3 ?F (36.3 ?C) (04/18 1030) ?Pulse Rate:  [55-83] 60 (04/18 1030) ?Resp:  [0-20] 20 (04/18 1000) ?BP: (79-153)/(38-55) 100/45 (04/18 1030) ?SpO2:  [82 %-100 %] 100 % (04/18 1030) ?Arterial Line BP: (90-172)/(30-63) 124/42 (04/18 1030) ?FiO2 (%):  [21 %-28 %] 28 % (04/18 0832) ?Last BM Date : 02/25/22 ? ?Intake/Output from previous day: ?04/17 0701 - 04/18 0700 ?In: 4223.9 [I.V.:1607.6; NG/GT:676.5; IV Piggyback:1939.9] ?Out: 870 [Urine:870] ?Intake/Output this shift: ?Total I/O ?In: 262.4 [I.V.:262.4] ?Out: 66 [Urine:75] ? ?General appearance: intubated,sedated ?Neck: LEFT neck- soft, dressing C/D/I scant serosang drainage in JP ?Cardio: regular rate and rhythm ?Extremities: extremities normal, atraumatic, no cyanosis or edema ? ?Lab Results:  ?Recent Labs  ?  02/25/22 ?0443 02/25/22 ?1055 02/26/22 ?6440  ?WBC 10.5  --  11.8*  ?HGB 11.5* 10.3* 8.8*  ?HCT 34.0* 30.9* 26.2*  ?PLT 238  --  274  ? ?BMET ?Recent Labs  ?  02/25/22 ?2347 02/26/22 ?3474  ?NA 134* 135  ?K 3.9 3.9  ?CL 103 102  ?CO2 25 25  ?GLUCOSE 149* 200*  ?BUN 18 20  ?CREATININE 0.79 0.70  ?CALCIUM 7.8* 7.9*  ? ?PT/INR ?Recent Labs  ?  02/24/22 ?2211 02/25/22 ?0443  ?LABPROT 13.4 13.9  ?INR 1.0 1.1  ? ?ABG ?Recent Labs  ?  02/25/22 ?0105  ?PHART 7.38  ?HCO3 24.8  ? ? ?Studies/Results: ?DG Chest Port 1 View ? ?Result Date: 02/25/2022 ?CLINICAL DATA:  66 year old female intubated. Postoperative day zero left neck exploration, repair of left carotid endarterectomy patch blowout. EXAM: PORTABLE CHEST 1 VIEW COMPARISON:  CT chest abdomen and pelvis 06/25/2019 and earlier. FINDINGS: Portable AP upright view at 0451 hours. Endotracheal tube tip in good position between the level the  clavicles and carina. Surgical drain and/or graft projects in the left neck. Normal cardiac size and mediastinal contours. Stable chronically somewhat large lung volumes. Allowing for portable technique the lungs are clear. No pneumothorax. Negative visible bowel gas. No acute osseous abnormality identified. IMPRESSION: 1. Satisfactory endotracheal tube. No acute cardiopulmonary abnormality. 2. Surgical drain and/or graft projects in the left neck. Electronically Signed   By: Odessa Fleming M.D.   On: 02/25/2022 05:31  ? ?DG Abd Portable 1V ? ?Result Date: 02/25/2022 ?CLINICAL DATA:  Nasogastric tube placement. EXAM: PORTABLE ABDOMEN - 1 VIEW COMPARISON:  None. FINDINGS: A nasogastric tube is seen with tip overlying the gastric antrum. No evidence of dilated bowel loops. Right upper quadrant surgical clips are seen from prior cholecystectomy. IMPRESSION: Nasogastric tube tip overlies the gastric antrum. Electronically Signed   By: Danae Orleans M.D.   On: 02/25/2022 11:55  ? ?[place] EKG SITE RITE ? ?Result Date: 02/26/2022 ?If [employer] not attached, placement could not be confirmed due to current cardiac rhythm.  ? ?Anti-infectives: ?Anti-infectives (From admission, onward)  ? ? Start     Dose/Rate Route Frequency Ordered Stop  ? 02/26/22 0600  vancomycin (VANCOCIN) IVPB 1000 mg/200 mL premix       ? 1,000 mg ?200 mL/hr over 60 Minutes Intravenous Every 24 hours 02/25/22 0443    ? 02/25/22 0530  vancomycin (VANCOREADY) IVPB 1750 mg/350 mL       ? 1,750 mg ?175 mL/hr over 120 Minutes Intravenous  Once 02/25/22  3785 02/25/22 0801  ? ?  ? ? ?Assessment/Plan: ? LEFT neck exploration,hemostasis, Partial cormatrix patch excision, primary suture repair of common carotid and internal carotid artery with reconstruction ?Continue Supportive care ?Keep intubated, sedated ?BP control for hypotension ?Monitor JP output ?Intraoperative cultures- no growth to date. Continue ABX  ?Possible carotid angiogram later in the week ?Appreciate  Intensivist input and care ? LOS: 1 day  ? ? ?Murline Weigel A ?02/26/2022 ? ?

## 2022-02-26 NOTE — TOC Initial Note (Signed)
Transition of Care (TOC) - Initial/Assessment Note  ? ? ?Patient Details  ?Name: Veronica Murray ?MRN: [id number] ?Date of Birth: 1956-04-30 ? ?Transition of Care (TOC) CM/SW Contact:    ?Allayne Butcher, RN ?Phone Number: ?02/26/2022, 3:44 PM ? ?Clinical Narrative:                 ?Patient admitted to the hospital for emergent vascular intervention from left carotic artery rupture/ endarterectomy patch blow out.  Patient is post of day 1, intubated and sedated in the ICU.  Current plan to keep patient intubated and sedated until vascular surgery deems stable.   ? ?TOC will follow.   ? ?Expected Discharge Plan:  (TBD) ?Barriers to Discharge: Continued Medical Work up ? ? ?Patient Goals and CMS Choice ?Patient states their goals for this hospitalization and ongoing recovery are:: intubated and sedated ?  ?  ? ?Expected Discharge Plan and Services ?Expected Discharge Plan:  (TBD) ?  ?  ?  ?Living arrangements for the past 2 months: Single Family Home ?                ?  ?  ?  ?  ?  ?  ?  ?  ?  ?  ? ?Prior Living Arrangements/Services ?Living arrangements for the past 2 months: Single Family Home ?Lives with:: Spouse ?Patient language and need for interpreter reviewed:: Yes ?       ?Need for Family Participation in Patient Care: Yes (Comment) ?Care giver support system in place?: Yes (comment) (husband) ?  ?Criminal Activity/Legal Involvement Pertinent to Current Situation/Hospitalization: No - Comment as needed ? ?Activities of Daily Living ?  ?  ? ?Permission Sought/Granted ?  ?Permission granted to share information with : No ?   ?   ?   ?   ? ?Emotional Assessment ?Appearance:: Appears stated age ?Attitude/Demeanor/Rapport: Intubated (Following Commands or Not Following Commands) ?Affect (typically observed): Unable to Assess ?  ?Alcohol / Substance Use: Not Applicable ?Psych Involvement: No (comment) ? ?Admission diagnosis:  Acute blood loss anemia [D62] ?Status post carotid endarterectomy [Z98.890] ?Post-operative  state [Z98.890] ?Patient Active Problem List  ? Diagnosis Date Noted  ? Post-operative state 02/25/2022  ? Rupture of carotid artery (HCC)   ? Adjustment disorder with mixed anxiety and depressed mood 01/25/2022  ? Carotid stenosis, left 01/24/2022  ? Carotid stenosis 01/18/2022  ? CAD (coronary artery disease) 01/18/2022  ? Essential hypertension 01/18/2022  ? Abnormal stress test   ? Troponin I above reference range 06/25/2019  ? ?PCP:  Patient, No Pcp Per (Inactive) ?Pharmacy:   ?[id number] [place], [place] - [address] AT [facility] ?[address] ?[place] [address] ?Phone: [phone] Fax: [phone] ? ?[id number] - [place], [place] - [address] AT [address] ?[address] ?[place] [address] ?Phone: [phone] Fax: [phone] ? ?[facility] [phone] - [place], [place] - [address] AT [facility] [address] ?[address] ?[place]  [address] ?Phone: [phone] Fax: [phone] ? ? ? ? ?Social Determinants of Health (SDOH) Interventions ?  ? ?Readmission Risk Interventions ?   ? View : No data to display.  ?  ?  ?  ? ? ? ?

## 2022-02-26 NOTE — Progress Notes (Signed)
CH entered room where patient was intubated upon arrival.  Patient was alert and appeared slightly restless. RN informed CH that husband will come either later in day or tomorrow. CH recited Psalms 23 and prayed for patient. Patient fell asleep. Follow up visits will not hurt.  ? ?Rev. Elizebeth Brooking, M.Div. ?Healthcare Chaplain ?

## 2022-02-26 NOTE — Consult Note (Signed)
Pharmacy Antibiotic Note ? ?Veronica Murray is a 66 y.o. female admitted on 02/24/2022 with  wound infection . PMH includes aortic arch atherosclerosis, diabetes mellitus, HTN. Recent left CEA on 3/17. On 4/12 noticed with sudden swelling, and drainage from incision. Pharmacy has been consulted for Vancomycin dosing.  ? ?Renal function stable and consistent with baseline. On day 3 of vancomycin.  ? ?Plan: Continue vancomycin 1000 mg IV Q 24 hrs. ?Goal AUC 400-550. ?Expected AUC: 502.2, Cmin 11.1 mcg/ml ?SCr used: 0.8, Vd used: 0.5 L/kg (BMI > 30); IBW to calculate CrCl and Ke ? ?Height: 5\' 2"  (157.5 cm) ?Weight: 77.1 kg (170 lb) ?IBW/kg (Calculated) : 50.1 ? ?Temp (24hrs), Avg:97.7 ?F (36.5 ?C), Min:94.6 ?F (34.8 ?C), Max:98.1 ?F (36.7 ?C) ? ?Recent Labs  ?Lab 02/24/22 ?2211 02/24/22 ?2216 02/24/22 ?2251 02/25/22 ?0443 02/25/22 ?1013 02/25/22 ?1634 02/25/22 ?2347 02/26/22 ?[id number]  ?WBC 7.4  --   --  10.5  --   --   --  11.8*  ?CREATININE 0.82   < >  --  0.58 0.74 0.67 0.79 0.70  ?LATICACIDVEN  --   --  >9.0* 0.8  --   --   --   --   ? < > = values in this interval not displayed.  ? ?  ?Estimated Creatinine Clearance: 69.2 mL/min (by C-G formula based on SCr of 0.7 mg/dL).   ? ?Allergies  ?Allergen Reactions  ? Codeine Hives, Shortness Of Breath and Nausea Only  ? Toradol [Ketorolac Tromethamine] Hives, Shortness Of Breath and Nausea Only  ? Tramadol Hcl Shortness Of Breath and Nausea Only  ?  "Conflicts with bipolar condition"  ? ? ?Antimicrobials this admission: ?4/17 Vancomycin >>  ? ?Dose adjustments this admission: none ? ?Microbiology results: ?4/17 MRSA PCR: not detected ?4/17: Wound culture: NG x 24 hours ? ?Thank you for allowing pharmacy to be a part of this patient?s care. ? ?Wynelle Cleveland, PharmD ?Pharmacy Resident  ?02/26/2022 ?12:48 PM ?

## 2022-02-27 ENCOUNTER — Encounter: Payer: Self-pay | Admitting: Vascular Surgery

## 2022-02-27 DIAGNOSIS — Z9889 Other specified postprocedural states: Secondary | ICD-10-CM | POA: Diagnosis not present

## 2022-02-27 LAB — BASIC METABOLIC PANEL
Anion gap: 2 — ABNORMAL LOW (ref 5–15)
BUN: 22 mg/dL (ref 8–23)
CO2: 26 mmol/L (ref 22–32)
Calcium: 7.3 mg/dL — ABNORMAL LOW (ref 8.9–10.3)
Chloride: 101 mmol/L (ref 98–111)
Creatinine, Ser: 0.48 mg/dL (ref 0.44–1.00)
GFR, Estimated: >60 mL/min (ref >60)
Glucose, Bld: 244 mg/dL — ABNORMAL HIGH (ref 70–99)
Potassium: 4.1 mmol/L (ref 3.5–5.1)
Sodium: 129 mmol/L — ABNORMAL LOW (ref 135–145)

## 2022-02-27 LAB — CBC
HCT: 22.1 % — ABNORMAL LOW (ref 36.0–46.0)
Hemoglobin: 7.3 g/dL — ABNORMAL LOW (ref 12.0–15.0)
MCH: 29.7 pg (ref 26.0–34.0)
MCHC: 33 g/dL (ref 30.0–36.0)
MCV: 89.8 fL (ref 80.0–100.0)
Platelets: 203 10*3/uL (ref 150–400)
RBC: 2.46 MIL/uL — ABNORMAL LOW (ref 3.87–5.11)
RDW: 13.3 % (ref 11.5–15.5)
WBC: 9.7 10*3/uL (ref 4.0–10.5)
nRBC: 0 % (ref 0.0–0.2)

## 2022-02-27 LAB — GLUCOSE, CAPILLARY
Glucose-Capillary: 198 mg/dL — ABNORMAL HIGH (ref 70–99)
Glucose-Capillary: 199 mg/dL — ABNORMAL HIGH (ref 70–99)
Glucose-Capillary: 209 mg/dL — ABNORMAL HIGH (ref 70–99)
Glucose-Capillary: 218 mg/dL — ABNORMAL HIGH (ref 70–99)
Glucose-Capillary: 232 mg/dL — ABNORMAL HIGH (ref 70–99)
Glucose-Capillary: 259 mg/dL — ABNORMAL HIGH (ref 70–99)

## 2022-02-27 LAB — PHOSPHORUS: Phosphorus: 3.4 mg/dL (ref 2.5–4.6)

## 2022-02-27 LAB — PREPARE RBC (CROSSMATCH)

## 2022-02-27 LAB — MAGNESIUM: Magnesium: 2 mg/dL (ref 1.7–2.4)

## 2022-02-27 LAB — PROCALCITONIN: Procalcitonin: <0.1 ng/mL

## 2022-02-27 LAB — TRIGLYCERIDES: Triglycerides: 367 mg/dL — ABNORMAL HIGH (ref <150)

## 2022-02-27 MED ORDER — SODIUM CHLORIDE 0.9% IV SOLUTION: Freq: Once | INTRAVENOUS | Status: AC

## 2022-02-27 MED ORDER — INSULIN ASPART 100 UNIT/ML IJ SOLN
3.0000 [IU] | INTRAMUSCULAR | Status: DC
  Administered 2022-02-27 – 2022-03-07 (×11): 3 [IU] via SUBCUTANEOUS
  Filled 2022-02-27 (×11): qty 1

## 2022-02-27 MED ORDER — INSULIN ASPART 100 UNIT/ML IJ SOLN
0.0000 [IU] | INTRAMUSCULAR | Status: DC
  Administered 2022-02-27: 11 [IU] via SUBCUTANEOUS
  Filled 2022-02-27: qty 1

## 2022-02-27 MED ORDER — INSULIN DETEMIR 100 UNIT/ML ~~LOC~~ SOLN
30.0000 [IU] | Freq: Two times a day (BID) | SUBCUTANEOUS | Status: DC
  Administered 2022-03-02 (×2): 30 [IU] via SUBCUTANEOUS
  Filled 2022-02-27 (×9): qty 0.3

## 2022-02-27 MED ORDER — INSULIN ASPART 100 UNIT/ML IJ SOLN: 3.0000 [IU] | Freq: Three times a day (TID) | INTRAMUSCULAR | Status: DC

## 2022-02-27 MED ORDER — PROSOURCE TF PO LIQD
45.0000 mL | Freq: Every day | ORAL | Status: DC
  Administered 2022-03-02 – 2022-03-04 (×2): 45 mL
  Filled 2022-02-27 (×5): qty 45

## 2022-02-27 MED ORDER — VITAL AF 1.2 CAL PO LIQD
1000.0000 mL | ORAL | Status: DC
  Administered 2022-02-27 – 2022-03-02 (×2): 1000 mL

## 2022-02-27 MED ORDER — INSULIN DETEMIR 100 UNIT/ML ~~LOC~~ SOLN
30.0000 [IU] | Freq: Two times a day (BID) | SUBCUTANEOUS | Status: DC
  Administered 2022-02-27: 30 [IU] via SUBCUTANEOUS
  Filled 2022-02-27 (×2): qty 0.3

## 2022-02-27 MED ORDER — INSULIN ASPART 100 UNIT/ML IJ SOLN
0.0000 [IU] | INTRAMUSCULAR | Status: DC
  Administered 2022-02-27: 7 [IU] via SUBCUTANEOUS
  Administered 2022-02-27: 4 [IU] via SUBCUTANEOUS
  Administered 2022-02-27: 7 [IU] via SUBCUTANEOUS
  Administered 2022-02-27 – 2022-03-01 (×6): 4 [IU] via SUBCUTANEOUS
  Administered 2022-03-01: 7 [IU] via SUBCUTANEOUS
  Administered 2022-03-01 (×2): 4 [IU] via SUBCUTANEOUS
  Administered 2022-03-02: 3 [IU] via SUBCUTANEOUS
  Administered 2022-03-02: 4 [IU] via SUBCUTANEOUS
  Administered 2022-03-02 (×3): 3 [IU] via SUBCUTANEOUS
  Administered 2022-03-03 – 2022-03-04 (×3): 4 [IU] via SUBCUTANEOUS
  Administered 2022-03-05: 3 [IU] via SUBCUTANEOUS
  Administered 2022-03-05: 4 [IU] via SUBCUTANEOUS
  Administered 2022-03-06 (×2): 3 [IU] via SUBCUTANEOUS
  Administered 2022-03-06: 4 [IU] via SUBCUTANEOUS
  Administered 2022-03-06 – 2022-03-07 (×4): 3 [IU] via SUBCUTANEOUS
  Administered 2022-03-07 (×3): 4 [IU] via SUBCUTANEOUS
  Administered 2022-03-08: 3 [IU] via SUBCUTANEOUS
  Administered 2022-03-08 – 2022-03-09 (×2): 4 [IU] via SUBCUTANEOUS
  Administered 2022-03-09 (×4): 7 [IU] via SUBCUTANEOUS
  Administered 2022-03-10: 4 [IU] via SUBCUTANEOUS
  Administered 2022-03-10: 11 [IU] via SUBCUTANEOUS
  Administered 2022-03-10: 7 [IU] via SUBCUTANEOUS
  Administered 2022-03-10: 11 [IU] via SUBCUTANEOUS
  Administered 2022-03-10 (×2): 7 [IU] via SUBCUTANEOUS
  Administered 2022-03-11: 11 [IU] via SUBCUTANEOUS
  Administered 2022-03-11 (×4): 7 [IU] via SUBCUTANEOUS
  Administered 2022-03-11: 11 [IU] via SUBCUTANEOUS
  Administered 2022-03-12: 4 [IU] via SUBCUTANEOUS
  Administered 2022-03-12: 15 [IU] via SUBCUTANEOUS
  Administered 2022-03-12 (×2): 4 [IU] via SUBCUTANEOUS
  Administered 2022-03-12: 15 [IU] via SUBCUTANEOUS
  Administered 2022-03-12 (×2): 7 [IU] via SUBCUTANEOUS
  Administered 2022-03-13: 11 [IU] via SUBCUTANEOUS
  Administered 2022-03-13 (×2): 4 [IU] via SUBCUTANEOUS
  Administered 2022-03-13: 7 [IU] via SUBCUTANEOUS
  Administered 2022-03-13: 11 [IU] via SUBCUTANEOUS
  Administered 2022-03-14 (×2): 4 [IU] via SUBCUTANEOUS
  Administered 2022-03-14: 7 [IU] via SUBCUTANEOUS
  Administered 2022-03-14: 4 [IU] via SUBCUTANEOUS
  Administered 2022-03-14 – 2022-03-15 (×3): 7 [IU] via SUBCUTANEOUS
  Administered 2022-03-15 (×3): 4 [IU] via SUBCUTANEOUS
  Filled 2022-02-27 (×74): qty 1

## 2022-02-27 MED ORDER — FREE WATER
20.0000 mL | Status: DC
  Administered 2022-02-27 – 2022-03-04 (×16): 20 mL

## 2022-02-27 MED ORDER — MIDAZOLAM-SODIUM CHLORIDE 100-0.9 MG/100ML-% IV SOLN
0.5000 mg/h | INTRAVENOUS | Status: DC
  Administered 2022-02-27: 0.5 mg/h via INTRAVENOUS
  Administered 2022-03-01: 4 mg/h via INTRAVENOUS
  Filled 2022-02-27 (×2): qty 100

## 2022-02-27 MED ORDER — NOREPINEPHRINE 16 MG/250ML-% IV SOLN
0.0000 ug/min | INTRAVENOUS | Status: DC
  Administered 2022-02-27: 2 ug/min via INTRAVENOUS
  Filled 2022-02-27: qty 250

## 2022-02-27 NOTE — Progress Notes (Signed)
? ?NAME:  Veronica Murray, MRN:  [id number], DOB:  03/24/1956, LOS: 2 ?ADMISSION DATE:  02/24/2022 ? ?History of Present Illness:  ?66 yo F transferred emergently to [facility] ED from [facility] ED for emergent vascular intervention on a ruptured carotid endarterectomy wound. History obtained from Epic documentation, patient intubated and sedated.  ?01/24/22 Patient underwent LEFT endarterectomy on at [facility] with Dr. Wyn Quaker without complication.  ?02/01/22 Follow up appointment on  revealed no complications.   ?02/22/22 Patient visited an urgent care onwith concern for infected endarterectomy site, picture in epic shows redness, edema and drainage around healing surgical site. She was hemodynamically stable and was started on a 3 day course of Keflex, instructed to f/u with provider. ? 02/24/22 patient brought in via EMS to [facility] ED with reports that surgical site burst open and began bleeding heavily. Per [facility] documentation patient was grey in color on arrival, unable to speak but one word at a time, EMS reporting hypotension with SBP in the 70's on arrival to [facility] ED. ?Patient was transferred to [facility] for emergent vascular intervention. ?ED course: ?At [facility] patient received 3 units of PRBC's & bolus of NS. In transport patient received fentanyl & zofran. Upon arrival at [facility] she was taken immediately to the OR for vascular intervention. ?  ?OR: ?Patient transferred ED to ED then emergently to the OR for left neck exploration due to LEFT carotid artery blow out with active bleed s/p LEFT CEA. She received 2 additional units of PRBC's, intubated and placed on mechanical ventilatory support. Patient underwent LEFT neck exploration, hemostasis, partial cormatrix excision, primary suture repair of common and internal carotid artery with reconstruction. EBL 350. ? ICU ?PCCM consulted for assistance and further management as patient is intubated requiring mechanical ventilatory support due to LEFT carotid artery rupture. ?  ? ?Significant  Hospital Events: ?Including procedures, antibiotic start and stop dates in addition to other pertinent events   ?02/25/22: Admit to ICU s/p carotid artery repair in the setting of endarterectomy carotid rupture ?4/18 plan for care discussed with surgery, patient remains intubated, left carotid artery disintegrated possible due to infection, remains on vent ?4/19 remains intubated and sedated ?  ? ? ? ? ? ?Interim History / Subjective:  ?Remains critically ill ?Remains on vent ?On pressors ?Awaiting VASC surgery recs ? ? ? ?Antimicrobials:  ? ?Antibiotics Given (last 72 hours)   ? ? Date/Time Action Medication Dose Rate  ? 02/25/22 0640 New Bag/Given  ? vancomycin (VANCOREADY) IVPB 1750 mg/350 mL 1,750 mg 175 mL/hr  ? 02/26/22 0547 New Bag/Given  ? vancomycin (VANCOCIN) IVPB 1000 mg/200 mL premix 1,000 mg 200 mL/hr  ? 02/26/22 1530 New Bag/Given  ? metroNIDAZOLE (FLAGYL) IVPB 500 mg 500 mg 100 mL/hr  ? 02/26/22 2156 New Bag/Given  ? metroNIDAZOLE (FLAGYL) IVPB 500 mg 500 mg 100 mL/hr  ? 02/27/22 0531 New Bag/Given  ? metroNIDAZOLE (FLAGYL) IVPB 500 mg 500 mg 100 mL/hr  ? 02/27/22 4098 New Bag/Given  ? vancomycin (VANCOCIN) IVPB 1000 mg/200 mL premix 1,000 mg 200 mL/hr  ? ?  ? ? ? ? ? ? ? ?Objective   ?Blood pressure (!) 149/55, pulse 77, temperature 99.7 ?F (37.6 ?C), resp. rate 12, height 5\' 2"  (1.575 m), weight 77.1 kg, SpO2 95 %. ?CVP:  [0 mmHg-16 mmHg] 8 mmHg  ?Vent Mode: PCV ?FiO2 (%):  [28 %] 28 % ?Set Rate:  [12 bmp] 12 bmp ?PEEP:  [5 cmH20] 5 cmH20 ?Pressure Support:  [5 cmH20] 5  cmH20 ?Plateau Pressure:  [14 cmH20-16 cmH20] 16 cmH20  ? ?Intake/Output Summary (Last 24 hours) at 02/27/2022 0704 ?Last data filed at 02/27/2022 0541 ?Gross per 24 hour  ?Intake 2441.16 ml  ?Output 895 ml  ?Net 1546.16 ml  ? ? ?Filed Weights  ? 02/25/22 0003  ?Weight: 77.1 kg  ? ? ? ?REVIEW OF SYSTEMS ? ?PATIENT IS UNABLE TO PROVIDE COMPLETE REVIEW OF SYSTEMS DUE TO SEVERE CRITICAL ILLNESS AND TOXIC METABOLIC  ENCEPHALOPATHY ? ? ? ?PHYSICAL EXAMINATION: ? ?GENERAL:critically ill appearing, +resp distress ?EYES: Pupils equal, round, reactive to light.  No scleral icterus.  ?MOUTH: Moist mucosal membrane. INTUBATED ?NECK: Supple. dressing intact with JP drain  ?PULMONARY: +rhonchi,  ?CARDIOVASCULAR: S1 and S2.  No murmurs  ?GASTROINTESTINAL: Soft, nontender, -distended. Positive bowel sounds.  ?MUSCULOSKELETAL: No swelling, clubbing, or edema.  ?NEUROLOGIC: obtunded ?SKIN:intact,warm,dry ? ? ? ? ? ? ?Labs/imaging that I havepersonally reviewed  ?(right click and "Reselect all SmartList Selections" daily)  ? ? ? ?ASSESSMENT AND PLAN ?SYNOPSIS ? ?66 yo WF Ruptured LEFT carotid at previous endarterectomy surgical site s/p repair ?With Circulatory Shock-hypovolumic and septic shock POA ?Lactic Acidosis in the setting of hemorrhagic/septic shock post op resp failure with tracheal deviation  ? ?Ruptured LEFT carotid at previous endarterectomy surgical site s/p repair ?Await further instructions from vasc surgery ?Possible Plan for re-assessment in OR in n ext 24-48 hrs ? ?Severe ACUTE Hypoxic and Hypercapnic Respiratory Failure ?-continue Mechanical Ventilator support ?-Wean Fio2 and PEEP as tolerated ?-VAP/VENT bundle implementation ?- Wean PEEP & FiO2 as tolerated, maintain SpO2 > 88% ?- Head of bed elevated 30 degrees, VAP protocol in place ?- Plateau pressures less than 30 cm H20  ?- Intermittent chest x-ray & ABG PRN ?- Ensure adequate pulmonary hygiene  ?-will NOT perform SAT/SBT  ? ? ?Vent Mode: PCV ?FiO2 (%):  [28 %] 28 % ?Set Rate:  [12 bmp] 12 bmp ?PEEP:  [5 cmH20] 5 cmH20 ?Pressure Support:  [5 cmH20] 5 cmH20 ?Plateau Pressure:  [14 cmH20-16 cmH20] 16 cmH20 ? ? COPD  ?-continue NEB THERAPY as prescribed ?-morphine as needed ?-wean fio2 as needed and tolerated ? ? ?INFECTIOUS DISEASE ?-continue antibiotics as prescribed ?-follow up cultures ? Surgical Site Infection ruptured Carotid artery ?Will need to cover FUSIFORM  SPECIES ?Continue IV abx as prescribed ? ? ?CARDIAC ?ICU monitoring ? ?KIDNEYS ?-continue Foley Catheter-assess need ?-Avoid nephrotoxic agents ?-Follow urine output, BMP ?-Ensure adequate renal perfusion, optimize oxygenation ?-Renal dose medications ? ? ?Intake/Output Summary (Last 24 hours) at 02/27/2022 0707 ?Last data filed at 02/27/2022 0541 ?Gross per 24 hour  ?Intake 2441.16 ml  ?Output 895 ml  ?Net 1546.16 ml  ? ? ?NEUROLOGY ?Acute toxic metabolic encephalopathy, need for sedation ?Goal RASS -2 to -3 ? ?SEPTIC shock ?SOURCE-LEFT carotid infection ?-use vasopressors to keep MAP>65 as needed ?-follow ABG and LA as needed ?-follow up cultures ?-emperic ABX ? ? ?ENDO ?- ICU hypoglycemic\Hyperglycemia protocol ?-check FSBS per protocol ? ? ?GI ?GI PROPHYLAXIS as indicated ? ?NUTRITIONAL STATUS ?DIET-->TF's as tolerated ?Constipation protocol as indicated ? ? ?ELECTROLYTES ?-follow labs as needed ?-replace as needed ?-pharmacy consultation and following ? ? ? ? ?Best practice (right click and "Reselect all SmartList Selections" daily)  ?Diet:  NPO ?Pain/Anxiety/Delirium protocol (if indicated): Yes (RASS goal -2) ?VAP protocol (if indicated): Yes ?DVT prophylaxis: Contraindicated ?GI prophylaxis: PPI ?Glucose control:  SSI Yes ?Central venous access:  Yes, and it is still needed ?Arterial line:  Yes, and it is still needed ?Foley:  Yes, and it is still needed ?Mobility:  bed rest  ?Code Status:  FULL CODE ?Disposition: ICU ? ?Labs   ?CBC: ?Recent Labs  ?Lab 02/24/22 ?2211 02/24/22 ?2216 02/25/22 ?0443 02/25/22 ?1055 02/26/22 ?[id number]  ?WBC 7.4  --  10.5  --  11.8*  ?NEUTROABS 2.1  --  8.1*  --  7.7  ?HGB 9.2* 8.8* 11.5* 10.3* 8.8*  ?HCT 27.0* 26.0* 34.0* 30.9* 26.2*  ?MCV 90.0  --  87.6  --  87.3  ?PLT 380  --  238  --  274  ? ? ? ?Basic Metabolic Panel: ?Recent Labs  ?Lab 02/25/22 ?0443 02/25/22 ?1013 02/25/22 ?1634 02/25/22 ?1653 02/25/22 ?2347 02/26/22 ?[id number] 02/27/22 ?0427  ?NA 132* 132* 136  --  134* 135 129*  ?K  5.2* 3.8 4.0  --  3.9 3.9 4.1  ?CL 98 103 102  --  103 102 101  ?CO2 26 22 26   --  25 25 26   ?GLUCOSE 490* 284* 114*  --  149* 200* 244*  ?BUN 15 16 17   --  18 20 22   ?CREATININE 0.58 0.74 0.67  --  0.79 0.70 0.48  ?CALCIUM 7.8* 7.

## 2022-02-27 NOTE — Progress Notes (Signed)
Inpatient Diabetes Program Recommendations ? ?AACE/ADA: New Consensus Statement on Inpatient Glycemic Control  ?Target Ranges:  Prepandial:   less than 140 mg/dL ?     Peak postprandial:   less than 180 mg/dL (1-2 hours) ?     Critically ill patients:  140 - 180 mg/dL  ? ? Latest Reference Range & Units 02/27/22 03:51 02/27/22 08:43  ?Glucose-Capillary 70 - 99 mg/dL 232 (H) ? ?Novolog 6 units 259 (H) ? ?Novolog 11 units  ? ? Latest Reference Range & Units 02/26/22 07:48 02/26/22 ?10:01 02/26/22 11:41 02/26/22 16:16 02/26/22 19:26 02/26/22 ?21:57 02/26/22 23:48  ?Glucose-Capillary 70 - 99 mg/dL 240 (H) ? ?Novolog 6 units  ? ? ?Levemir 26 units 253 (H) ? ?Novolog 6 units 206 (H) ? ?Novolog 6 units 179 (H) ? ?Novolog 4 units  ? ? ?Levemir 26 units 160 (H) ? ?Novolog 4 units  ? ?Review of Glycemic Control ? ?Diabetes history: DM2 ?Outpatient Diabetes medications: Lantus 20 units BID ?Current orders for Inpatient glycemic control: Novolog 0-20 units Q4H, Novolog 3 units TID with meals; Vital @ 40 ml/hr ? ?Inpatient Diabetes Program Recommendations:   ? ?Insulin: Noted Levemir discontinued and Novolog correction scale changed. If patient is still on ventilator, please consider reordering Levemir at 30 units BID and change Novolog 3 units TID to Novolog 3 units Q4H. ? ?Thanks, ?Barnie Alderman, RN, MSN, CDE ?Diabetes Coordinator ?Inpatient Diabetes Program ?[phone] (Team Pager from 8am to 5pm) ? ? ? ?

## 2022-02-27 NOTE — TOC Progression Note (Signed)
Transition of Care (TOC) - Progression Note  ? ? ?Patient Details  ?Name: Veronica Murray ?MRN: [id number] ?Date of Birth: Aug 10, 1956 ? ?Transition of Care (TOC) CM/SW Contact  ?Allayne Butcher, RN ?Phone Number: ?02/27/2022, 11:20 AM ? ?Clinical Narrative:    ?Plan is for angiogram tomorrow.  Patient remains intubated and sedated. ? ? ?Expected Discharge Plan:  (TBD) ?Barriers to Discharge: Continued Medical Work up ? ?Expected Discharge Plan and Services ?Expected Discharge Plan:  (TBD) ?  ?  ?  ?Living arrangements for the past 2 months: Single Family Home ?                ?  ?  ?  ?  ?  ?  ?  ?  ?  ?  ? ? ?Social Determinants of Health (SDOH) Interventions ?  ? ?Readmission Risk Interventions ?   ? View : No data to display.  ?  ?  ?  ? ? ?

## 2022-02-27 NOTE — Progress Notes (Signed)
Nutrition Follow Up Note  ? ?DOCUMENTATION CODES:  ? ?Obesity unspecified ? ?INTERVENTION:  ? ?Change to Vital 1.2 @55ml /hr + Pro-Source 5ml daily via tube ? ?Free water flushes 81ml q4 hours to maintain tube patency  ? ?Regimen provides 1624kcal/day, 110g/day protein and 1173ml/day of free water  ? ?NUTRITION DIAGNOSIS:  ? ?Inadequate oral intake related to inability to eat (pt sedated and ventilated) as evidenced by NPO status. ? ?GOAL:  ? ?Provide needs based on ASPEN/SCCM guidelines ? ?MONITOR:  ? ?Vent status, Labs, Weight trends, Skin, I & O's ? ?REASON FOR ASSESSMENT:  ? ?Malnutrition Screening Tool, Ventilator ?  ? ?ASSESSMENT:  ? 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 ? ?Pt remains sedated and ventilated. OGT in place; pt tolerating tube feeds at goal rate. Propofol discontinued; will adjust tube feeds. Pt with hyponatremia today; will adjust free water per MD request. Plan is for angiogram tomorrow. No new weight since admission; will request daily weights. Pt +6.3L on her I & Os.  ? ?Medications reviewed and include: colace, insulin, MVI, miralax, metronidazole, vancomycin  ? ?Labs reviewed: Na 129(L), K 4.1 wnl, P 3.4 wnl, Mg 2.0 wnl ?Hgb 8.8(L), Hct 26.2(L) ?Cbgs- 259, 232 x 24 hrs ? ?Patient is currently intubated on ventilator support ?MV: 7.5 L/min ?Temp (24hrs), Avg:98.6 ?F (37 ?C), Min:96.3 ?F (35.7 ?C), Max:99.9 ?F (37.7 ?C) ? ?Propofol: discontinued  ? ?MAP- >78mmHg  ? ?UOP- 77m ? ?JP drain- 15.69ml output  ? ?Diet Order:   ?Diet Order   ? ?       ?  Diet NPO time specified  Diet effective now       ?  ? ?  ?  ? ?  ? ?EDUCATION NEEDS:  ? ?No education needs have been  identified at this time ? ?Skin:  Skin Assessment: Skin Integrity Issues: ?Skin Integrity Issues:: Incisions ?Incisions: closed lt neck ? ?Last BM:  02/26/22 ? ?Height:  ? ?Ht Readings from Last 1 Encounters:  ?02/25/22 5\' 2"  (1.575 m)  ? ? ?Weight:  ? ?Wt Readings from Last 1 Encounters:  ?02/25/22 77.1 kg  ? ? ?Ideal Body Weight:  50 kg ? ?BMI:  Body mass index is 31.09 kg/m?. ? ?Estimated Nutritional Needs:  ? ?Kcal:  1548kcal/day ? ?Protein:  >100g/day ? ?Fluid:  1.5-1.8L/day ? ? MS, RD, LDN ?Please refer to AMION for RD and/or RD on-call/weekend/after hours pager ? ?

## 2022-02-27 NOTE — Progress Notes (Signed)
2 Days Post-Op  ? ?Subjective/Chief Complaint: ?Remains Intubated, sedated. Off and on vasopressors for hypotension. Otherwise remains Hd stable. ? ? ?Objective: ?Vital signs in last 24 hours: ?Temp:  [96.3 ?F (35.7 ?C)-99.9 ?F (37.7 ?C)] 99 ?F (37.2 ?C) (04/19 1100) ?Pulse Rate:  [59-90] 76 (04/19 1100) ?Resp:  [11-28] 11 (04/19 1100) ?BP: (91-149)/(30-78) 94/57 (04/19 1100) ?SpO2:  [94 %-100 %] 100 % (04/19 1100) ?Arterial Line BP: (88-179)/(40-152) 100/85 (04/19 1100) ?FiO2 (%):  [28 %] 28 % (04/19 1050) ?Last BM Date : 02/26/22 ? ?Intake/Output from previous day: ?04/18 0701 - 04/19 0700 ?In: 2694.6 [I.V.:1383.3; NG/GT:937.3; IV Piggyback:374] ?Out: 895 [Urine:880; Drains:15] ?Intake/Output this shift: ?Total I/O ?In: 303.4 [I.V.:177.3; IV Piggyback:126.1] ?Out: 285 [Urine:285] ? ?General appearance: intubate, sedated ?Neck: LEFT neck- dressing- C/D/I,soft,  scant serosang in JP ?Cardio: regular rate and rhythm ? ?Lab Results:  ?Recent Labs  ?  02/25/22 ?0443 02/25/22 ?1055 02/26/22 ?[id number]  ?WBC 10.5  --  11.8*  ?HGB 11.5* 10.3* 8.8*  ?HCT 34.0* 30.9* 26.2*  ?PLT 238  --  274  ? ?BMET ?Recent Labs  ?  02/26/22 ?[id number] 02/27/22 ?0427  ?NA 135 129*  ?K 3.9 4.1  ?CL 102 101  ?CO2 25 26  ?GLUCOSE 200* 244*  ?BUN 20 22  ?CREATININE 0.70 0.48  ?CALCIUM 7.9* 7.3*  ? ?PT/INR ?Recent Labs  ?  02/24/22 ?2211 02/25/22 ?0443  ?LABPROT 13.4 13.9  ?INR 1.0 1.1  ? ?ABG ?Recent Labs  ?  02/25/22 ?0105  ?PHART 7.38  ?HCO3 24.8  ? ? ?Studies/Results: ?DG Abd Portable 1V ? ?Result Date: 02/25/2022 ?CLINICAL DATA:  Nasogastric tube placement. EXAM: PORTABLE ABDOMEN - 1 VIEW COMPARISON:  None. FINDINGS: A nasogastric tube is seen with tip overlying the gastric antrum. No evidence of dilated bowel loops. Right upper quadrant surgical clips are seen from prior cholecystectomy. IMPRESSION: Nasogastric tube tip overlies the gastric antrum. Electronically Signed   By: Marlaine Hind M.D.   On: 02/25/2022 11:55  ? ?[place] EKG SITE RITE ? ?Result  Date: 02/26/2022 ?If [employer] not attached, placement could not be confirmed due to current cardiac rhythm.  ? ?Anti-infectives: ?Anti-infectives (From admission, onward)  ? ? Start     Dose/Rate Route Frequency Ordered Stop  ? 02/26/22 1400  metroNIDAZOLE (FLAGYL) IVPB 500 mg       ? 500 mg ?100 mL/hr over 60 Minutes Intravenous Every 8 hours 02/26/22 1059    ? 02/26/22 0600  vancomycin (VANCOCIN) IVPB 1000 mg/200 mL premix       ? 1,000 mg ?200 mL/hr over 60 Minutes Intravenous Every 24 hours 02/25/22 0443    ? 02/25/22 0530  vancomycin (VANCOREADY) IVPB 1750 mg/350 mL       ? 1,750 mg ?175 mL/hr over 120 Minutes Intravenous  Once 02/25/22 0441 02/25/22 0801  ? ?  ? ? ?Assessment/Plan: ?S/P LEFT neck exploration, carotid artery revision- ?Plan for LEFT carotid angiogram, possible stenting tomorrow. Consent obtained from husband-POA via phone. Cultures negative to date.  ? ? LOS: 2 days  ? ? ?Shawnte Demarest A ?02/27/2022 ? ?

## 2022-02-27 NOTE — Progress Notes (Signed)
PHARMACY CONSULT NOTE - FOLLOW UP ? ?Pharmacy Consult for Electrolyte Monitoring and Replacement  ? ?Recent Labs: ?Potassium (mmol/L)  ?Date Value  ?02/27/2022 4.1  ? ?Magnesium (mg/dL)  ?Date Value  ?02/27/2022 2.0  ? ?Calcium (mg/dL)  ?Date Value  ?02/27/2022 7.3 (L)  ? ?Albumin (g/dL)  ?Date Value  ?02/25/2022 2.3 (L)  ? ?Phosphorus (mg/dL)  ?Date Value  ?02/27/2022 3.4  ? ?Sodium (mmol/L)  ?Date Value  ?02/27/2022 129 (L)  ? ?Corrected Calcium: 8.66 mg/dl ? ?Assessment: ?66 year old female presenting with ruptured carotic endarterectomy wound. 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 Patient is intubated and sedated in the ICU. Pharmacy has been consulted to manage electrolytes. ? ?Renal function consistent with baseline. ? ?On propofol infusion and fentanyl boluses. Norepinephrine at low dose to maintain MAPs. Tube feeds ongoing at 45 ml/hr with basal and bolus insulin.  ? ?Na 129 and I&O +4L.  ? ?Goal of Therapy:  ?Electrolytes within normal limits ?K 4-5 ?Mag > 2 ? ?Plan:  ?IVF discontinued 4/18. ?No electrolyte replacement warranted ?Follow up electrolytes in the AM ? ? ?Jaynie Bream, PharmD ?Pharmacy Resident  ?02/27/2022 ?8:16 AM ? ? ?

## 2022-02-28 ENCOUNTER — Encounter: Payer: Self-pay | Admitting: Vascular Surgery

## 2022-02-28 ENCOUNTER — Encounter
Admission: EM | Disposition: A | Discharge status: Skilled Nursing Facility | Payer: Self-pay | Source: Home / Self Care | Attending: Obstetrics and Gynecology

## 2022-02-28 ENCOUNTER — Inpatient Hospital Stay: Payer: Medicare HMO | Admitting: Registered Nurse

## 2022-02-28 DIAGNOSIS — I772 Rupture of artery: Secondary | ICD-10-CM | POA: Diagnosis not present

## 2022-02-28 DIAGNOSIS — Z9889 Other specified postprocedural states: Secondary | ICD-10-CM | POA: Diagnosis not present

## 2022-02-28 DIAGNOSIS — T827XXA Infection and inflammatory reaction due to other cardiac and vascular devices, implants and grafts, initial encounter: Secondary | ICD-10-CM

## 2022-02-28 DIAGNOSIS — I6522 Occlusion and stenosis of left carotid artery: Secondary | ICD-10-CM | POA: Diagnosis not present

## 2022-02-28 DIAGNOSIS — I97618 Postprocedural hemorrhage and hematoma of a circulatory system organ or structure following other circulatory system procedure: Secondary | ICD-10-CM | POA: Diagnosis not present

## 2022-02-28 HISTORY — PX: CAROTID PTA/STENT INTERVENTION: CATH118231

## 2022-02-28 LAB — CBC
HCT: 26.5 % — ABNORMAL LOW (ref 36.0–46.0)
HCT: 28.8 % — ABNORMAL LOW (ref 36.0–46.0)
Hemoglobin: 8.7 g/dL — ABNORMAL LOW (ref 12.0–15.0)
Hemoglobin: 9.5 g/dL — ABNORMAL LOW (ref 12.0–15.0)
MCH: 29.5 pg (ref 26.0–34.0)
MCH: 29.3 pg (ref 26.0–34.0)
MCHC: 32.8 g/dL (ref 30.0–36.0)
MCHC: 33 g/dL (ref 30.0–36.0)
MCV: 89.8 fL (ref 80.0–100.0)
MCV: 88.9 fL (ref 80.0–100.0)
Platelets: 215 10*3/uL (ref 150–400)
Platelets: 272 10*3/uL (ref 150–400)
RBC: 2.95 MIL/uL — ABNORMAL LOW (ref 3.87–5.11)
RBC: 3.24 MIL/uL — ABNORMAL LOW (ref 3.87–5.11)
RDW: 13.5 % (ref 11.5–15.5)
RDW: 13.6 % (ref 11.5–15.5)
WBC: 9.3 10*3/uL (ref 4.0–10.5)
WBC: 10 10*3/uL (ref 4.0–10.5)
nRBC: 0 % (ref 0.0–0.2)
nRBC: 0 % (ref 0.0–0.2)

## 2022-02-28 LAB — TYPE AND SCREEN
ABO/RH(D): O POS
Antibody Screen: NEGATIVE
Unit division: 0
Unit division: 0
Unit division: 0
Unit division: 0
Unit division: 0

## 2022-02-28 LAB — GLUCOSE, CAPILLARY
Glucose-Capillary: 168 mg/dL — ABNORMAL HIGH (ref 70–99)
Glucose-Capillary: 135 mg/dL — ABNORMAL HIGH (ref 70–99)
Glucose-Capillary: 143 mg/dL — ABNORMAL HIGH (ref 70–99)
Glucose-Capillary: 141 mg/dL — ABNORMAL HIGH (ref 70–99)
Glucose-Capillary: 135 mg/dL — ABNORMAL HIGH (ref 70–99)

## 2022-02-28 LAB — BPAM RBC
Blood Product Expiration Date: 202305022359
Blood Product Expiration Date: 202305042359
Blood Product Expiration Date: 202305082359
Blood Product Expiration Date: 202305082359
Blood Product Expiration Date: 202305182359
ISSUE DATE / TIME: 202304162350
ISSUE DATE / TIME: 202304162350
ISSUE DATE / TIME: 202304170115
ISSUE DATE / TIME: 202304171549
ISSUE DATE / TIME: 202304191935
Unit Type and Rh: 5100
Unit Type and Rh: 5100
Unit Type and Rh: 5100
Unit Type and Rh: 5100
Unit Type and Rh: 5100

## 2022-02-28 LAB — BASIC METABOLIC PANEL
Anion gap: 4 — ABNORMAL LOW (ref 5–15)
BUN: 19 mg/dL (ref 8–23)
CO2: 26 mmol/L (ref 22–32)
Calcium: 7.3 mg/dL — ABNORMAL LOW (ref 8.9–10.3)
Chloride: 103 mmol/L (ref 98–111)
Creatinine, Ser: 0.37 mg/dL — ABNORMAL LOW (ref 0.44–1.00)
GFR, Estimated: >60 mL/min (ref >60)
Glucose, Bld: 162 mg/dL — ABNORMAL HIGH (ref 70–99)
Potassium: 3.7 mmol/L (ref 3.5–5.1)
Sodium: 133 mmol/L — ABNORMAL LOW (ref 135–145)

## 2022-02-28 LAB — PHOSPHORUS: Phosphorus: 2.9 mg/dL (ref 2.5–4.6)

## 2022-02-28 LAB — MAGNESIUM: Magnesium: 1.7 mg/dL (ref 1.7–2.4)

## 2022-02-28 LAB — POCT ACTIVATED CLOTTING TIME: Activated Clotting Time: 257 seconds

## 2022-02-28 LAB — CREATININE, SERUM
Creatinine, Ser: 0.4 mg/dL — ABNORMAL LOW (ref 0.44–1.00)
GFR, Estimated: >60 mL/min (ref >60)

## 2022-02-28 SURGERY — CAROTID PTA/STENT INTERVENTION
Anesthesia: General | Laterality: Left

## 2022-02-28 MED ORDER — ONDANSETRON HCL 4 MG/2ML IJ SOLN
4.0000 mg | Freq: Four times a day (QID) | INTRAMUSCULAR | Status: DC | PRN
Start: 2022-02-28 — End: 2022-03-15
  Administered 2022-03-09: 4 mg via INTRAVENOUS
  Filled 2022-02-28: qty 2

## 2022-02-28 MED ORDER — KETAMINE HCL 10 MG/ML IJ SOLN
INTRAMUSCULAR | Status: DC | PRN
  Administered 2022-02-28: 20 mg via INTRAVENOUS

## 2022-02-28 MED ORDER — MIDAZOLAM HCL 2 MG/ML PO SYRP: 8.0000 mg | ORAL_SOLUTION | Freq: Once | ORAL | Status: DC | PRN

## 2022-02-28 MED ORDER — FENTANYL CITRATE (PF) 100 MCG/2ML IJ SOLN
INTRAMUSCULAR | Status: DC | PRN
Start: 2022-02-28 — End: 2022-02-28
  Administered 2022-02-28: 50 ug via INTRAVENOUS

## 2022-02-28 MED ORDER — SODIUM CHLORIDE 0.9 % IV SOLN: INTRAVENOUS | Status: DC

## 2022-02-28 MED ORDER — KETAMINE HCL 50 MG/5ML IJ SOSY
PREFILLED_SYRINGE | INTRAMUSCULAR | Status: AC
  Filled 2022-02-28: qty 5

## 2022-02-28 MED ORDER — ROCURONIUM BROMIDE 10 MG/ML (PF) SYRINGE
PREFILLED_SYRINGE | INTRAVENOUS | Status: AC
  Filled 2022-02-28: qty 10

## 2022-02-28 MED ORDER — SODIUM CHLORIDE 0.9% FLUSH
3.0000 mL | Freq: Two times a day (BID) | INTRAVENOUS | Status: DC
  Administered 2022-02-28 – 2022-03-15 (×21): 3 mL via INTRAVENOUS

## 2022-02-28 MED ORDER — LIDOCAINE HCL (PF) 2 % IJ SOLN
INTRAMUSCULAR | Status: AC
  Filled 2022-02-28: qty 5

## 2022-02-28 MED ORDER — HEPARIN SODIUM (PORCINE) 1000 UNIT/ML IJ SOLN
INTRAMUSCULAR | Status: DC | PRN
  Administered 2022-02-28: 7000 [IU] via INTRAVENOUS
  Administered 2022-02-28: 2000 [IU] via INTRAVENOUS

## 2022-02-28 MED ORDER — MIDAZOLAM HCL 2 MG/2ML IJ SOLN
INTRAMUSCULAR | Status: DC | PRN
  Administered 2022-02-28: 2 mg via INTRAVENOUS

## 2022-02-28 MED ORDER — LABETALOL HCL 5 MG/ML IV SOLN
10.0000 mg | INTRAVENOUS | Status: DC | PRN
  Administered 2022-03-01 (×3): 10 mg via INTRAVENOUS
  Filled 2022-02-28 (×2): qty 4

## 2022-02-28 MED ORDER — POTASSIUM CHLORIDE 20 MEQ PO PACK
40.0000 meq | PACK | Freq: Once | ORAL | Status: AC
  Administered 2022-02-28: 40 meq
  Filled 2022-02-28: qty 2

## 2022-02-28 MED ORDER — HEPARIN SODIUM (PORCINE) 1000 UNIT/ML IJ SOLN
INTRAMUSCULAR | Status: AC
  Filled 2022-02-28: qty 10

## 2022-02-28 MED ORDER — FAMOTIDINE 20 MG PO TABS: 40.0000 mg | ORAL_TABLET | Freq: Once | ORAL | Status: DC | PRN

## 2022-02-28 MED ORDER — HYDRALAZINE HCL 20 MG/ML IJ SOLN
5.0000 mg | INTRAMUSCULAR | Status: AC | PRN
  Administered 2022-03-01 (×2): 5 mg via INTRAVENOUS
  Filled 2022-02-28: qty 1

## 2022-02-28 MED ORDER — ENOXAPARIN SODIUM 100 MG/ML IJ SOSY
1.0000 mg/kg | PREFILLED_SYRINGE | Freq: Two times a day (BID) | INTRAMUSCULAR | Status: DC
  Administered 2022-03-01 – 2022-03-02 (×3): 87.5 mg via SUBCUTANEOUS
  Filled 2022-02-28 (×3): qty 0.88

## 2022-02-28 MED ORDER — CLOPIDOGREL BISULFATE 75 MG PO TABS
75.0000 mg | ORAL_TABLET | Freq: Every day | ORAL | Status: DC
Start: 2022-03-01 — End: 2022-03-06
  Administered 2022-03-01 – 2022-03-05 (×4): 75 mg
  Filled 2022-02-28 (×4): qty 1

## 2022-02-28 MED ORDER — ACETAMINOPHEN 325 MG PO TABS
650.0000 mg | ORAL_TABLET | ORAL | Status: DC | PRN
  Administered 2022-03-11 – 2022-03-15 (×7): 650 mg via ORAL
  Filled 2022-02-28 (×7): qty 2

## 2022-02-28 MED ORDER — SODIUM CHLORIDE 0.9 % IV SOLN: INTRAVENOUS | Status: DC | PRN

## 2022-02-28 MED ORDER — MAGNESIUM SULFATE 2 GM/50ML IV SOLN
2.0000 g | Freq: Once | INTRAVENOUS | Status: AC
  Administered 2022-02-28: 2 g via INTRAVENOUS
  Filled 2022-02-28: qty 50

## 2022-02-28 MED ORDER — SODIUM CHLORIDE 0.9% FLUSH
3.0000 mL | INTRAVENOUS | Status: DC | PRN
  Administered 2022-03-11: 3 mL via INTRAVENOUS

## 2022-02-28 MED ORDER — ASPIRIN 81 MG PO CHEW
81.0000 mg | CHEWABLE_TABLET | Freq: Every day | ORAL | Status: DC
  Administered 2022-02-28 – 2022-03-05 (×5): 81 mg
  Filled 2022-02-28 (×5): qty 1

## 2022-02-28 MED ORDER — SODIUM CHLORIDE 0.9 % IV SOLN: INTRAVENOUS | Status: AC

## 2022-02-28 MED ORDER — VASOPRESSIN 20 UNIT/ML IV SOLN
INTRAVENOUS | Status: AC
  Filled 2022-02-28: qty 1

## 2022-02-28 MED ORDER — ASPIRIN EC 81 MG PO TBEC
81.0000 mg | DELAYED_RELEASE_TABLET | Freq: Every day | ORAL | Status: DC
  Filled 2022-02-28: qty 1

## 2022-02-28 MED ORDER — PROPOFOL 10 MG/ML IV BOLUS
INTRAVENOUS | Status: AC
  Filled 2022-02-28: qty 20

## 2022-02-28 MED ORDER — CLOPIDOGREL BISULFATE 75 MG PO TABS
75.0000 mg | ORAL_TABLET | Freq: Every day | ORAL | Status: DC
  Administered 2022-02-28: 75 mg via ORAL
  Filled 2022-02-28: qty 1

## 2022-02-28 MED ORDER — ONDANSETRON HCL 4 MG/2ML IJ SOLN
INTRAMUSCULAR | Status: AC
Start: 2022-02-28 — End: ?
  Filled 2022-02-28: qty 2

## 2022-02-28 MED ORDER — DIPHENHYDRAMINE HCL 50 MG/ML IJ SOLN: 50.0000 mg | Freq: Once | INTRAMUSCULAR | Status: DC | PRN

## 2022-02-28 MED ORDER — METHYLPREDNISOLONE SODIUM SUCC 125 MG IJ SOLR
125.0000 mg | Freq: Once | INTRAMUSCULAR | Status: DC | PRN
Start: 2022-02-28 — End: 2022-02-28

## 2022-02-28 MED ORDER — LABETALOL HCL 5 MG/ML IV SOLN
10.0000 mg | INTRAVENOUS | Status: DC | PRN
  Administered 2022-02-28 (×2): 10 mg via INTRAVENOUS
  Filled 2022-02-28: qty 4

## 2022-02-28 MED ORDER — SODIUM CHLORIDE 0.9 % IV SOLN
3.0000 g | Freq: Four times a day (QID) | INTRAVENOUS | Status: DC
  Administered 2022-02-28 – 2022-03-15 (×57): 3 g via INTRAVENOUS
  Filled 2022-02-28 (×3): qty 8
  Filled 2022-02-28 (×2): qty 3
  Filled 2022-02-28 (×3): qty 8
  Filled 2022-02-28: qty 3
  Filled 2022-02-28 (×2): qty 8
  Filled 2022-02-28: qty 3
  Filled 2022-02-28: qty 8
  Filled 2022-02-28 (×5): qty 3
  Filled 2022-02-28: qty 8
  Filled 2022-02-28 (×2): qty 3
  Filled 2022-02-28 (×3): qty 8
  Filled 2022-02-28: qty 3
  Filled 2022-02-28 (×2): qty 8
  Filled 2022-02-28 (×5): qty 3
  Filled 2022-02-28: qty 8
  Filled 2022-02-28: qty 3
  Filled 2022-02-28: qty 8
  Filled 2022-02-28: qty 3
  Filled 2022-02-28 (×2): qty 8
  Filled 2022-02-28 (×2): qty 3
  Filled 2022-02-28 (×3): qty 8
  Filled 2022-02-28 (×3): qty 3
  Filled 2022-02-28: qty 8
  Filled 2022-02-28 (×2): qty 3
  Filled 2022-02-28: qty 8
  Filled 2022-02-28: qty 3
  Filled 2022-02-28: qty 8
  Filled 2022-02-28 (×2): qty 3
  Filled 2022-02-28 (×3): qty 8
  Filled 2022-02-28 (×2): qty 3
  Filled 2022-02-28 (×2): qty 8

## 2022-02-28 MED ORDER — ONDANSETRON HCL 4 MG/2ML IJ SOLN
INTRAMUSCULAR | Status: DC | PRN
  Administered 2022-02-28: 4 mg via INTRAVENOUS

## 2022-02-28 MED ORDER — SODIUM CHLORIDE 0.9 % IV SOLN: 250.0000 mL | INTRAVENOUS | Status: DC | PRN

## 2022-02-28 MED ORDER — PROPOFOL 10 MG/ML IV BOLUS
INTRAVENOUS | Status: DC | PRN
  Administered 2022-02-28: 30 mg via INTRAVENOUS

## 2022-02-28 MED ORDER — DEXAMETHASONE SODIUM PHOSPHATE 10 MG/ML IJ SOLN
INTRAMUSCULAR | Status: AC
  Filled 2022-02-28: qty 1

## 2022-02-28 MED ORDER — FENTANYL CITRATE (PF) 100 MCG/2ML IJ SOLN
INTRAMUSCULAR | Status: AC
  Filled 2022-02-28: qty 2

## 2022-02-28 MED ORDER — MIDAZOLAM HCL 2 MG/2ML IJ SOLN
INTRAMUSCULAR | Status: AC
  Filled 2022-02-28: qty 2

## 2022-02-28 SURGICAL SUPPLY — 23 items
CATH ANGIO 5F PIGTAIL 100CM (CATHETERS) ×1 IMPLANT
CATH BEACON 5 .035 100 JB2 TIP (CATHETERS) ×1 IMPLANT
CATH G 5FX100 (CATHETERS) ×1 IMPLANT
COVER DRAPE FLUORO 36X44 (DRAPES) ×1 IMPLANT
COVER PROBE U/S 5X48 (MISCELLANEOUS) ×1 IMPLANT
DEVICE EMBOSHIELD NAV6 4.0-7.0 (FILTER) ×1 IMPLANT
DEVICE SAFEGUARD 24CM (GAUZE/BANDAGES/DRESSINGS) ×1 IMPLANT
DEVICE STARCLOSE SE CLOSURE (Vascular Products) ×1 IMPLANT
DEVICE TORQUE .025-.038 (MISCELLANEOUS) ×1 IMPLANT
GLIDEWIRE ANGLED SS 035X260CM (WIRE) ×1 IMPLANT
GOWN SRG XL 47XLVL 3 (GOWN DISPOSABLE) IMPLANT
GOWN STRL NON-REIN TWL XL LVL3 (GOWN DISPOSABLE) ×6
GUIDEWIRE VASC STIFF .038X260 (WIRE) ×1 IMPLANT
KIT CAROTID MANIFOLD (MISCELLANEOUS) ×1 IMPLANT
KIT ENCORE 26 ADVANTAGE (KITS) ×1 IMPLANT
PACK ANGIOGRAPHY (CUSTOM PROCEDURE TRAY) ×2 IMPLANT
SHEATH BRITE TIP 5FRX11 (SHEATH) ×1 IMPLANT
SHEATH SHUTTLE 7FR (SHEATH) ×1 IMPLANT
STENT VIABAHN 7X7.5X120 (Permanent Stent) ×1 IMPLANT
SYR MEDRAD MARK 7 150ML (SYRINGE) ×1 IMPLANT
TUBING CONTRAST HIGH PRESS 72 (TUBING) ×1 IMPLANT
WIRE BAREWIRE WORK .014X315CM (WIRE) ×1 IMPLANT
WIRE GUIDERIGHT .035X150 (WIRE) ×1 IMPLANT

## 2022-02-28 NOTE — Progress Notes (Signed)
PHARMACY CONSULT NOTE - FOLLOW UP ? ?Pharmacy Consult for Electrolyte Monitoring and Replacement  ? ?Recent Labs: ?Potassium (mmol/L)  ?Date Value  ?02/28/2022 3.7  ? ?Magnesium (mg/dL)  ?Date Value  ?02/28/2022 1.7  ? ?Calcium (mg/dL)  ?Date Value  ?02/28/2022 7.3 (L)  ? ?Albumin (g/dL)  ?Date Value  ?02/25/2022 2.3 (L)  ? ?Phosphorus (mg/dL)  ?Date Value  ?02/28/2022 2.9  ? ?Sodium (mmol/L)  ?Date Value  ?02/28/2022 133 (L)  ? ?Corrected Calcium: 8.66 mg/dl ? ?Assessment: ?66 year old female presenting with ruptured carotic endarterectomy wound. 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 Patient is intubated and sedated in the ICU. Pharmacy has been consulted to manage electrolytes. ? ?Renal function consistent with baseline. ? ?On propofol infusion and fentanyl boluses. Norepinephrine at low dose to maintain MAPs. Tube feeds ongoing at 45 ml/hr with basal and bolus insulin.  ? ?Na 129 and I&O +4L.  ? ?Goal of Therapy:  ?Electrolytes within normal limits ?K 4-5 ?Mag > 2 ? ?Plan:  ?Kcl 40 mEq per tube x 1. ?Magnesium 2 grams x 1 ?Follow up electrolytes in the AM ? ? ?Jaynie Bream, PharmD ?Pharmacy Resident  ?02/28/2022 ?3:16 PM ? ? ?

## 2022-02-28 NOTE — Anesthesia Procedure Notes (Signed)
Arterial Line Insertion ?Start/End4/20/2023 1:27 PM, 02/28/2022 1:27 PM ?Performed by: Iran Ouch, MD, anesthesiologist ? Patient location: OR. ?Preanesthetic checklist: patient identified, IV checked, site marked, risks and benefits discussed, surgical consent, monitors and equipment checked, pre-op evaluation, timeout performed and anesthesia consent ?Right, radial was placed ?Catheter size: 20 G ?Hand hygiene performed  and maximum sterile barriers used  ? ?Attempts: 1 ?Procedure performed using ultrasound guided technique. ?Ultrasound Notes:anatomy identified and no ultrasound evidence of intravascular and/or intraneural injection ?Following insertion, dressing applied. ?Post procedure assessment: normal and unchanged ? ?Patient tolerated the procedure well with no immediate complications. ? ? ?

## 2022-02-28 NOTE — Anesthesia Preprocedure Evaluation (Addendum)
Anesthesia Evaluation  ?Patient identified by MRN, date of birth, ID band ?Patient unresponsive ? ?General Assessment Comment:Sedated on mechanical ventilation ? ?Reviewed: ?Allergy & Precautions, NPO status , Patient's Chart, lab work & pertinent test results ? ?History of Anesthesia Complications ?Negative for: history of anesthetic complications ? ?Airway ? ? ? ? ? ? ?Comment: Unable to perform, ETT in place Dental ? ?(+) Poor Dentition ?  ?Pulmonary ?neg COPD, neg recent URI, Current Smoker and Patient abstained from smoking.,  ?  ?breath sounds clear to auscultation ? ? ? ? ? ? Cardiovascular ?hypertension, (-) angina+ CAD and + Peripheral Vascular Disease (carotid stenosis on Plavix)  ?(-) Past MI Normal cardiovascular exam(-) dysrhythmias (-) Valvular Problems/Murmurs ?Rhythm:Regular Rate:Normal ? ?CAD- Cath- 2020 ?1.  Severe one-vessel coronary artery disease with an occluded first diagonal which seems to be the culprit for abnormal stress test and wall motion abnormality noted.  In addition, there is moderate three-vessel disease. ?2.  Mildly reduced LV systolic function with anterolateral akinesis that fits the distribution of the first diagonal. ?3.  Mildly elevated left ventricular end-diastolic pressure. ? ?  ?Neuro/Psych ?negative neurological ROS ?   ? GI/Hepatic ?negative GI ROS,   ?Endo/Other  ?diabetes, Type 2Obesity  ? Renal/GU ?negative Renal ROS  ? ?  ?Musculoskeletal ? ?(+) Fibromyalgia - ? Abdominal ?  ?Peds ? Hematology ?negative hematology ROS ?(+)   ?Anesthesia Other Findings ?Past Medical History: ?No date: Aortic arch atherosclerosis ([place]) ?No date: Diabetes mellitus without complication ([place]) ?No date: Essential hypertension ?No date: Fibromyalgia ?No date: Tobacco abuse ? ?Patient was emergently brought to the OR for bleed from previous carotid surgery.  Patient has received  pRBCs and has been intubated in the ICU with plans for stent placement  today. ? ?Circulatory Shock-hypovolumic and septic shock POA ? Reproductive/Obstetrics ? ?  ? ? ? ? ? ? ? ? ? ? ? ? ? ?  ?  ? ? ? ? ? ? ? ?Anesthesia Physical ? ?Anesthesia Plan ? ?ASA: 3 ? ?Anesthesia Plan: General  ? ?Post-op Pain Management:   ? ?Induction: Inhalational ? ?PONV Risk Score and Plan: 2 and Ondansetron, Dexamethasone and Treatment may vary due to age or medical condition ? ?Airway Management Planned: Oral ETT ? ?Additional Equipment: Arterial line ? ?Intra-op Plan:  ? ?Post-operative Plan: Post-operative intubation/ventilation ? ?Informed Consent: I have reviewed the patients History and Physical, chart, labs and discussed the procedure including the risks, benefits and alternatives for the proposed anesthesia with the patient or authorized representative who has indicated his/her understanding and acceptance.  ? ? ? ?Dental advisory given and Consent reviewed with POA ? ?Plan Discussed with: CRNA and Anesthesiologist ? ?Anesthesia Plan Comments: (Patient consented for risks of anesthesia including but not limited to:  ?- adverse reactions to medications ?- damage to eyes, teeth, lips or other oral mucosa ?- nerve damage due to positioning  ?- sore throat or hoarseness ?- damage to heart, brain, nerves, lungs, other parts of body or loss of life ? ?Informed patient about role of CRNA in peri- and intra-operative care.  Patient voiced understanding.)  ? ? ? ? ? ?Anesthesia Quick Evaluation ? ?

## 2022-02-28 NOTE — Op Note (Signed)
? ? ?OPERATIVE NOTE ?DATE: 02/28/2022 ? ?PROCEDURE: ? Ultrasound guidance for vascular access right femoral artery ? Placement of a 50mm x 7.5 cm Viabahn covered stent with the use of the NAV-6 embolic protection device in the left carotid artery ? ?PRE-OPERATIVE DIAGNOSIS: 1.  Left carotid artery stenosis. ?2.  Bleeding from left carotid patch with concern for recurrent bleeding which could be life-threatening requiring covered stent placement ? ?POST-OPERATIVE DIAGNOSIS:  Same as above ? ?SURGEON: Leotis Pain, MD ? ?ASSISTANT(S): Jamesetta So, MD ? ?ANESTHESIA: local/MCS ? ?ESTIMATED BLOOD LOSS:  20 cc ? ?CONTRAST: 60 cc ? ?FLUORO TIME: 5.8 minutes ? ?ANESTHESIA:  general ? ?FINDING(S): ?1.   Left common carotid artery stenosis, normal carotid artery proximal and distal to the previous endarterectomy site with a patulous endarterectomy site from the previous patch ? ?SPECIMEN(S):   none ? ?INDICATIONS:   ?Patient is a 66 y.o. female who presents with bleeding from a previous carotid patch from a carotid endarterectomy done a little over 1 month ago.  This represents a patch failure.  There is question of infection, this clearly needs to be addressed for concern for recurrent bleeding which could be life-threatening and a covered stent seems to be the best option as surgically the area was so socked in complete removal of the patch and replacement or bypass was not technically feasible.  Risks and benefits were discussed and informed consent was obtained.  ? ?DESCRIPTION: ?After obtaining full informed written consent, the patient was brought back to the vascular suite and placed supine upon the table.  The patient received IV antibiotics prior to induction. Moderate conscious sedation was administered during a face to face encounter with the patient throughout the procedure with my supervision of the RN administering medicines and monitoring the patients vital signs and mental status throughout from the start of the  procedure until the patient was taken to the recovery room.  After obtaining adequate anesthesia, the patient was prepped and draped in the standard fashion.   The right femoral artery was visualized with ultrasound and found to be widely patent. It was then accessed under direct ultrasound guidance without difficulty with a Seldinger needle. A permanent image was recorded. A J-wire was placed and we then placed a 6 French sheath. The patient was then heparinized and a total of 9000 units of intravenous heparin were given and an ACT was checked to confirm successful anticoagulation. A pigtail catheter was then placed into the ascending aorta. This showed a type 3 aortic arch with no proximal stenosis. I then selectively cannulated the left common carotid artery without difficulty with a JB2 catheter and advanced into the mid left common carotid artery.  Cervical and cerebral carotid angiography was then performed. There were no obvious intracranial filling defects with good intracranial flow. The carotid bifurcation demonstrated some narrowing in the common carotid artery at the proximal edge of the previously placed patch angioplasty and then a patulous arterectomy site and a normal internal carotid artery distal to the endarterectomy site.  I then advanced into the external carotid artery with a Glidewire and the JB2 catheter and then exchanged for the Amplatz Super Stiff wire. Over the Amplatz Super Stiff wire, a 6 [place] shuttle sheath was placed into the mid common carotid artery. I then used the NAV-6  Embolic protection device and crossed the lesion and parked this in the distal internal carotid artery at the base of the skull.  I then selected a 7 mm x 7.5  cm length Viabahn stent. This was deployed across the lesion and the patch encompassing it in its entirety with 1.5 to 2 cm of coverage in normal artery proximally and distally. Only about a 5-10% residual stenosis was present after angioplasty. Completion  angiogram showed normal intracranial filling without new defects. At this point I elected to terminate the procedure. The sheath was removed and StarClose closure device was deployed in the right femoral artery with excellent hemostatic result. The patient was taken to the recovery room in stable condition having tolerated the procedure well. ? ?COMPLICATIONS: none ? ?CONDITION: stable ? ?Leotis Pain ?02/28/2022 ?2:09 PM ? ? ?This note was created with Dragon Medical transcription system. Any errors in dictation are purely unintentional.  ?

## 2022-02-28 NOTE — Transfer of Care (Signed)
Immediate Anesthesia Transfer of Care Note ? ?Patient: Veronica Murray ? ?Procedure(s) Performed: CAROTID PTA/STENT INTERVENTION (Left) ? ?Patient Location: SICU ? ?Anesthesia Type:General ? ?Level of Consciousness: sedated and Patient remains intubated per anesthesia plan ? ?Airway & Oxygen Therapy: Patient remains intubated per anesthesia plan and Patient placed on Ventilator (see vital sign flow sheet for setting) ? ?Post-op Assessment: Report given to RN and Post -op Vital signs reviewed and stable ? ?Post vital signs: Reviewed and stable ? ?Last Vitals:  ?Vitals Value Taken Time  ?BP    ?Temp 36.9 ?C 02/28/22 1439  ?Pulse 80 02/28/22 1439  ?Resp 11 02/28/22 1439  ?SpO2 100 % 02/28/22 1439  ?Vitals shown include unvalidated device data. ? ?Last Pain:  ?Vitals:  ? 02/28/22 1130  ?TempSrc: Esophageal  ?   ? ?  ? ?Complications: There were no known notable events for this encounter. ?

## 2022-02-28 NOTE — Consult Note (Signed)
NAME: DARTHA ROZZELL  ?DOB: 1956/07/29  ?MRN: [id number]  ?Date/Time: 02/28/2022 4:20 PM ? ?REQUESTING PROVIDER: Dr.Esco ?Subjective:  ?REASON FOR CONSULT: Endovascular infection ?? ?ELAH AVELLINO is a 66 y.o. with a history of DM. Aortic arch atherosclerosis, HTN, tobacco usecarotid stenosis with amaurosis fugax saw vascualr and underwent carotid duplex that showed 50-69% of RT ICA stenosis and > 70% of left ICA.she was started on plavix, aspirin and statin on 01/18/22  and then on 01/24/22 she underwent  ?Carotid angio which showed Selective imaging was then performed of the cervical and cerebral carotid artery on the left. Intracranial filling was extremely sluggish with only a small amount of middle cerebral flow seen and no anterior cerebral flow. The cervical carotid artery was densely calcified with a subtotal occlusion and minimal forward flow not appropriate for endovascular therapy. On 01/25/22 she underwent Left carotid endarterectomy with CorMatrix arterial patch reconstruction. Got cefazolin perioperativelywas discharged on 01/26/22.on 1 week follow up she was doing fine. Then on 02/22/22 she presented to [facility] with 2 day h/o swelling, pain and drainage from the left neck wound. She did nto have any fever ? ? ?Vitals N. Discharged on keflex  ?She returned to the ED with spontaneous bleeding ? ?Was hypotensive ?HB 9.2- was transfused 2 units and was transferred to [facility] ?On 02/25/22 she was taken to the OR for left neck exploration. The patch was essentially detached from its anastomosis. The artery was friable.she underwent Partial cormatrix patch excision, primary suture repair of common carotid and internal carotid artery with reconstruction. Culture was sent from the patch. ?Pt was transferred to ICU and has remain intubated. Was put on vanco and metronidazole-  ?culture came back as streptococcus constellatus and she is now on Unasyn. ?Today patient underwent Placement of a 17m x 7.5 cm  Viabahn covered stent with the use of the NAV-6 embolic protection device in the left carotid artery ? I am asked to see the patient for the endovascular infection ? ? ?Past Medical History:  ?Diagnosis Date  ? [place] arch atherosclerosis [place]   ? Diabetes mellitus without complication ([place]   ? Essential hypertension   ? Fibromyalgia   ? Tobacco abuse   ?  ?Past Surgical History:  ?Procedure Laterality Date  ? CAROTID PTA/STENT INTERVENTION Left 01/24/2022  ? Procedure: CAROTID PTA/STENT INTERVENTION;  Surgeon: DAlgernon Huxley MD;  [place] [facility] [place];  Service: Cardiovascular;  Laterality: Left;  ? CAROTID PTA/STENT INTERVENTION Left 02/28/2022  ? Procedure: CAROTID PTA/STENT INTERVENTION;  Surgeon: DAlgernon Huxley MD;  [place] [facility];  Service: Cardiovascular;  Laterality: Left;  ? CERVICAL BIOPSY    ? CHOLECYSTECTOMY    ? ENDARTERECTOMY Left 01/25/2022  ? Procedure: ENDARTERECTOMY CAROTID, possible ligation;  Surgeon: DAlgernon Huxley MD;  Location: [facility] ORS;  Service: Vascular;  Laterality: Left;  ? ENDARTERECTOMY Left 02/25/2022  ? Procedure: ENDARTERECTOMY CAROTID revision;  Surgeon: EEvaristo Bury MD;  Location: [facility] ORS;  Service: Vascular;  Laterality: Left;  ? HERNIA REPAIR    ? LEFT HEART CATH AND CORONARY ANGIOGRAPHY Left 07/05/2019  ? Procedure: LEFT HEART CATH AND CORONARY ANGIOGRAPHY;  Surgeon: AWellington Hampshire MD;  [place] [facility];  Service: Cardiovascular;  Laterality: Left;  ?  ?Social History  ? ?Socioeconomic History  ? Marital status: Married  ?  Spouse name: Not on file  ? Number of children: Not on file  ? Years of education: Not on file  ?  Highest education level: Not on file  ?Occupational History  ? Not on file  ?Tobacco Use  ? Smoking status: Every Day  ?  Packs/day: 1.50  ?  Years: 50.00  ?  Pack years: 75.00  ?  Types: Cigarettes  ? Smokeless tobacco: Never  ?Substance and Sexual Activity  ? Alcohol use: Not Currently  ? Drug use: Yes  ?  Frequency:  7.0 times per week  ?  Types: Marijuana  ? Sexual activity: Not on file  ?Other Topics Concern  ? Not on file  ?Social History Narrative  ? Not on file  ? ?Social Determinants of Health  ? ?Financial Resource Strain: Not on file  ?Food Insecurity: Not on file  ?Transportation Needs: Not on file  ?Physical Activity: Not on file  ?Stress: Not on file  ?Social Connections: Not on file  ?Intimate Partner Violence: Not on file  ?  ?Family History  ?Problem Relation Age of Onset  ? CAD Neg Hx   ? ?Allergies  ?Allergen Reactions  ? Codeine Hives, Shortness Of Breath and Nausea Only  ? Toradol [Ketorolac Tromethamine] Hives, Shortness Of Breath and Nausea Only  ? Tramadol Hcl Shortness Of Breath and Nausea Only  ?  "Conflicts with bipolar condition"  ? ?I? ?Current Facility-Administered Medications  ?Medication Dose Route Frequency Provider Last Rate Last Admin  ? 0.9 %  sodium chloride infusion   Intravenous PRN Flora Lipps, MD   Stopped at 02/26/22 1633  ? 0.9 %  sodium chloride infusion   Intravenous Continuous Esco, Miechia A, MD      ? 0.9 %  sodium chloride infusion  250 mL Intravenous PRN Esco, Miechia A, MD      ? acetaminophen (TYLENOL) tablet 650 mg  650 mg Oral Q4H PRN Esco, Miechia A, MD      ? Ampicillin-Sulbactam (UNASYN) 3 g in sodium chloride 0.9 % 100 mL IVPB  3 g Intravenous Q6H Flora Lipps, MD 200 mL/hr at 02/28/22 1156 Infusion Verify at 02/28/22 1156  ? aspirin chewable tablet 81 mg  81 mg Per Tube Daily Benita Gutter, RPH   81 mg at 02/28/22 1524  ? chlorhexidine gluconate (MEDLINE KIT) (PERIDEX) 0.12 % solution 15 mL  15 mL Mouth Rinse BID Esco, Miechia A, MD   15 mL at 02/28/22 0815  ? Chlorhexidine Gluconate Cloth 2 % PADS 6 each  6 each Topical Daily Flora Lipps, MD   6 each at 02/27/22 1209  ? [START ON 03/01/2022] clopidogrel (PLAVIX) tablet 75 mg  75 mg Per Tube Daily Benita Gutter, RPH      ? docusate (COLACE) 50 MG/5ML liquid 100 mg  100 mg Per Tube BID Rust-Chester, Britton L, NP    100 mg at 02/27/22 2208  ? [START ON 03/01/2022] enoxaparin (LOVENOX) injection 87.5 mg  1 mg/kg Subcutaneous Q12H Esco, Miechia A, MD      ? feeding supplement (PROSource TF) liquid 45 mL  45 mL Per Tube Daily Flora Lipps, MD      ? feeding supplement (VITAL AF 1.2 CAL) liquid 1,000 mL  1,000 mL Per Tube Continuous Flora Lipps, MD   Held at 02/28/22 0000  ? fentaNYL (SUBLIMAZE) bolus via infusion 50-100 mcg  50-100 mcg Intravenous Q15 min PRN Rust-Chester, Britton L, NP   100 mcg at 02/28/22 0935  ? fentaNYL 2556mg in NS 2546m(1034mml) infusion-PREMIX  50-200 mcg/hr Intravenous Continuous Rust-Chester, Britton L, NP 20 mL/hr at 02/28/22 1305 200 mcg/hr at  02/28/22 1305  ? free water 20 mL  20 mL Per Tube Q4H Wynelle Cleveland, RPH   20 mL at 02/27/22 2208  ? hydrALAZINE (APRESOLINE) injection 5 mg  5 mg Intravenous Q20 Min PRN Esco, Miechia A, MD      ? insulin aspart (novoLOG) injection 0-20 Units  0-20 Units Subcutaneous Q4H Darel Hong D, NP   4 Units at 02/28/22 0353  ? insulin aspart (novoLOG) injection 3 Units  3 Units Subcutaneous Q4H Bradly Bienenstock, NP   3 Units at 02/27/22 2341  ? insulin detemir (LEVEMIR) injection 30 Units  30 Units Subcutaneous BID Dallie Piles, RPH      ? labetalol (NORMODYNE) injection 10 mg  10 mg Intravenous Q10 min PRN Esco, Miechia A, MD      ? labetalol (NORMODYNE) injection 10-20 mg  10-20 mg Intravenous Q1H PRN Flora Lipps, MD      ? magnesium sulfate IVPB 2 g 50 mL  2 g Intravenous Once Wynelle Cleveland, RPH      ? MEDLINE mouth rinse  15 mL Mouth Rinse 10 times per day Jamesetta So A, MD   15 mL at 02/28/22 1525  ? midazolam (VERSED) 100 mg/100 mL (1 mg/mL) premix infusion  0.5-10 mg/hr Intravenous Continuous Flora Lipps, MD 2 mL/hr at 02/28/22 1305 2 mg/hr at 02/28/22 1305  ? norepinephrine (LEVOPHED) 16 mg in 27m premix infusion  0-40 mcg/min Intravenous Titrated KBradly Bienenstock NP   Stopped at 02/28/22 [phone] ? ondansetron (ZOFRAN) injection 4 mg  4  mg Intravenous Q6H PRN Esco, Miechia A, MD      ? pantoprazole (PROTONIX) injection 40 mg  40 mg Intravenous Q24H Rust-Chester, Britton L, NP   40 mg at 02/28/22 0558  ? polyethylene glycol (MIRALAX / GL

## 2022-02-28 NOTE — Progress Notes (Signed)
? ?NAME:  Veronica Murray, MRN:  [id number], DOB:  03-06-1956, LOS: 3 ?ADMISSION DATE:  02/24/2022 ? ?History of Present Illness:  ?66 yo F transferred emergently to [facility] ED from [facility] ED for emergent vascular intervention on a ruptured carotid endarterectomy wound. History obtained from Epic documentation, patient intubated and sedated.  ?01/24/22 Patient underwent LEFT endarterectomy on at [facility] with Dr. Wyn Quaker without complication.  ?02/01/22 Follow up appointment on  revealed no complications.   ?02/22/22 Patient visited an urgent care onwith concern for infected endarterectomy site, picture in epic shows redness, edema and drainage around healing surgical site. She was hemodynamically stable and was started on a 3 day course of Keflex, instructed to f/u with provider. ? 02/24/22 patient brought in via EMS to [facility] ED with reports that surgical site burst open and began bleeding heavily. Per [facility] documentation patient was grey in color on arrival, unable to speak but one word at a time, EMS reporting hypotension with SBP in the 70's on arrival to [facility] ED. ?Patient was transferred to [facility] for emergent vascular intervention. ?ED course: ?At [facility] patient received 3 units of PRBC's & bolus of NS. In transport patient received fentanyl & zofran. Upon arrival at [facility] she was taken immediately to the OR for vascular intervention. ?  ?OR: ?Patient transferred ED to ED then emergently to the OR for left neck exploration due to LEFT carotid artery blow out with active bleed s/p LEFT CEA. She received 2 additional units of PRBC's, intubated and placed on mechanical ventilatory support. Patient underwent LEFT neck exploration, hemostasis, partial cormatrix excision, primary suture repair of common and internal carotid artery with reconstruction. EBL 350. ? ICU ?PCCM consulted for assistance and further management as patient is intubated requiring mechanical ventilatory support due to LEFT carotid artery rupture. ?  ? ?Significant  Hospital Events: ?Including procedures, antibiotic start and stop dates in addition to other pertinent events   ?02/25/22: Admit to ICU s/p carotid artery repair in the setting of endarterectomy carotid rupture ?4/18 plan for care discussed with surgery, patient remains intubated, left carotid artery disintegrated possible due to infection, remains on vent ?4/19 remains intubated and sedated ?4/20 remains intubated, sedated plan for angiogram ? ? ?Interim History / Subjective:  ?Remains critically ill ?Remains on  vent ?On pressors ?Await angiogram ? ? ? ? ?Antimicrobials:  ? ?Antibiotics Given (last 72 hours)   ? ? Date/Time Action Medication Dose Rate  ? 02/26/22 0547 New Bag/Given  ? vancomycin (VANCOCIN) IVPB 1000 mg/200 mL premix 1,000 mg 200 mL/hr  ? 02/26/22 1530 New Bag/Given  ? metroNIDAZOLE (FLAGYL) IVPB 500 mg 500 mg 100 mL/hr  ? 02/26/22 2156 New Bag/Given  ? metroNIDAZOLE (FLAGYL) IVPB 500 mg 500 mg 100 mL/hr  ? 02/27/22 0531 New Bag/Given  ? metroNIDAZOLE (FLAGYL) IVPB 500 mg 500 mg 100 mL/hr  ? 02/27/22 8850 New Bag/Given  ? vancomycin (VANCOCIN) IVPB 1000 mg/200 mL premix 1,000 mg 200 mL/hr  ? 02/27/22 1515 New Bag/Given  ? metroNIDAZOLE (FLAGYL) IVPB 500 mg 500 mg 100 mL/hr  ? 02/27/22 2210 New Bag/Given  ? metroNIDAZOLE (FLAGYL) IVPB 500 mg 500 mg 100 mL/hr  ? 02/28/22 0559 New Bag/Given  ? metroNIDAZOLE (FLAGYL) IVPB 500 mg 500 mg 100 mL/hr  ? ?  ? ? ? ? ? ? ? ?Objective   ?Blood pressure 135/66, pulse 80, temperature 98.6 ?F (37 ?C), resp. rate 11, height 5\' 2"  (1.575 m), weight 87.3 kg, SpO2 100 %. ?CVP:  [8  mmHg-20 mmHg] 13 mmHg  ?Vent Mode: PCV ?FiO2 (%):  [28 %] 28 % ?Set Rate:  [12 bmp] 12 bmp ?PEEP:  [5 cmH20] 5 cmH20 ?Plateau Pressure:  [16 cmH20] 16 cmH20  ? ?Intake/Output Summary (Last 24 hours) at 02/28/2022 0709 ?Last data filed at 02/28/2022 8413 ?Gross per 24 hour  ?Intake 2350.87 ml  ?Output 1200 ml  ?Net 1150.87 ml  ? ? ?Filed Weights  ? 02/25/22 0003 02/28/22 0335  ?Weight: 77.1 kg  87.3 kg  ? ? ?REVIEW OF SYSTEMS ? ?PATIENT IS UNABLE TO PROVIDE COMPLETE REVIEW OF SYSTEMS DUE TO SEVERE CRITICAL ILLNESS AND TOXIC METABOLIC ENCEPHALOPATHY ? ? ? ?PHYSICAL EXAMINATION: ? ?GENERAL:critically ill appearing, +resp distress ?EYES: Pupils equal, round, reactive to light.  No scleral icterus.  ?MOUTH: Moist mucosal membrane. INTUBATED ?NECK: Supple. dressing intact with JP drain  ?PULMONARY: +rhonchi, +wheezing ?CARDIOVASCULAR: S1 and S2.  No murmurs  ?GASTROINTESTINAL: Soft, nontender, -distended. Positive bowel sounds.  ?MUSCULOSKELETAL: No swelling, clubbing, or edema.  ?NEUROLOGIC: obtunded ?SKIN:intact,warm,dry ? ? ? ? ?Labs/imaging that I havepersonally reviewed  ?(right click and "Reselect all SmartList Selections" daily)  ? ? ? ?ASSESSMENT AND PLAN ?SYNOPSIS ? ?66 yo WF Ruptured LEFT carotid at previous endarterectomy surgical site s/p repair ?With Circulatory Shock-hypovolumic and septic shock POA ?Lactic Acidosis in the setting of hemorrhagic/septic shock post op resp failure with tracheal deviation  ? ?Ruptured LEFT carotid at previous endarterectomy surgical site s/p repair ?Await further instructions from vasc surgery ?Possible Plan for re-assessment in OR in n ext 24-48 hrs ? ?Severe ACUTE Hypoxic and Hypercapnic Respiratory Failure ?-continue Mechanical Ventilator support ?-Wean Fio2 and PEEP as tolerated ?-VAP/VENT bundle implementation ?- Wean PEEP & FiO2 as tolerated, maintain SpO2 > 88% ?- Head of bed elevated 30 degrees, VAP protocol in place ?- Plateau pressures less than 30 cm H20  ?- Intermittent chest x-ray & ABG PRN ?- Ensure adequate pulmonary hygiene  ?-will NOT perform SAT/SBT  ? ?Vent Mode: PCV ?FiO2 (%):  [28 %] 28 % ?Set Rate:  [12 bmp] 12 bmp ?PEEP:  [5 cmH20] 5 cmH20 ?Plateau Pressure:  [16 cmH20] 16 cmH20 ? ? COPD  ?-continue NEB THERAPY as prescribed ?-morphine as needed ?-wean fio2 as needed and tolerated ? ?INFECTIOUS DISEASE ?-continue antibiotics as  prescribed ?-follow up cultures ?Surgical Site Infection ruptured Carotid artery ?Will need to cover FUSIFORM SPECIES ?Continue IV abx as prescribed ? ? ?CARDIAC ?ICU monitoring ? ?Kidneys ?-continue Foley Catheter-assess need ?-Avoid nephrotoxic agents ?-Follow urine output, BMP ?-Ensure adequate renal perfusion, optimize oxygenation ?-Renal dose medications ? ? ?Intake/Output Summary (Last 24 hours) at 02/28/2022 2440 ?Last data filed at 02/28/2022 1027 ?Gross per 24 hour  ?Intake 2350.87 ml  ?Output 1200 ml  ?Net 1150.87 ml  ? ? ?NEUROLOGY ?-need for sedation ?-Goal RASS -2 to -3 ? ? ? ?SEPTIC shock ?SOURCE-LEFT carotid infection ?-use vasopressors to keep MAP>65 as needed ? ? ?ENDO ?- ICU hypoglycemic\Hyperglycemia protocol ?-check FSBS per protocol ? ? ?GI ?GI PROPHYLAXIS as indicated ? ?NUTRITIONAL STATUS ?DIET-->TF's as tolerated ?Constipation protocol as indicated ? ? ?ELECTROLYTES ?-follow labs as needed ?-replace as needed ?-pharmacy consultation and following ? ? ? ? ?Best practice (right click and "Reselect all SmartList Selections" daily)  ?Diet:  NPO ?Pain/Anxiety/Delirium protocol (if indicated): Yes (RASS goal -2) ?VAP protocol (if indicated): Yes ?DVT prophylaxis: Contraindicated ?GI prophylaxis: PPI ?Glucose control:  SSI Yes ?Central venous access:  Yes, and it is still needed ?Arterial line:  Yes, and it is  still needed ?Foley:  Yes, and it is still needed ?Mobility:  bed rest  ?Code Status:  FULL CODE ?Disposition: ICU ? ?Labs   ?CBC: ?Recent Labs  ?Lab 02/24/22 ?2211 02/24/22 ?2216 02/25/22 ?0443 02/25/22 ?1055 02/26/22 ?[id number] 02/27/22 ?1547 02/28/22 ?[id number]  ?WBC 7.4  --  10.5  --  11.8* 9.7 9.3  ?NEUTROABS 2.1  --  8.1*  --  7.7  --   --   ?HGB 9.2*   < > 11.5* 10.3* 8.8* 7.3* 8.7*  ?HCT 27.0*   < > 34.0* 30.9* 26.2* 22.1* 26.5*  ?MCV 90.0  --  87.6  --  87.3 89.8 89.8  ?PLT 380  --  238  --  274 203 215  ? < > = values in this interval not displayed.  ? ? ? ?Basic Metabolic Panel: ?Recent Labs   ?Lab 02/25/22 ?0443 02/25/22 ?1013 02/25/22 ?1634 02/25/22 ?1653 02/25/22 ?2347 02/26/22 ?[id number] 02/27/22 ?0427 02/28/22 ?[id number]  ?NA 132*   < > 136  --  134* 135 129* 133*  ?K 5.2*   < > 4.0  --  3.9 3.9 4.1 3.7  ?CL 98   < >

## 2022-02-28 NOTE — H&P (Addendum)
 [facility] ?History & Physical Update ? ?The patient was re-examined.  LEFT Carotid angiogram and stenting. Patient with LEFT carotid patch infection/bleeding. +cultures. The patient's previous History and Physical has been reviewed and is unchanged.  There is no change in the plan of care. We plan to proceed with the scheduled procedure. Consent obtained from husband- Ilaria Much. ? ?Bertram Denver, MD ? ?02/28/2022, 9:54 AM ?  ?

## 2022-03-01 ENCOUNTER — Inpatient Hospital Stay: Payer: Medicare HMO

## 2022-03-01 DIAGNOSIS — T827XXA Infection and inflammatory reaction due to other cardiac and vascular devices, implants and grafts, initial encounter: Secondary | ICD-10-CM | POA: Diagnosis not present

## 2022-03-01 DIAGNOSIS — Z9889 Other specified postprocedural states: Secondary | ICD-10-CM | POA: Diagnosis not present

## 2022-03-01 DIAGNOSIS — I772 Rupture of artery: Secondary | ICD-10-CM | POA: Diagnosis not present

## 2022-03-01 DIAGNOSIS — I639 Cerebral infarction, unspecified: Secondary | ICD-10-CM | POA: Diagnosis not present

## 2022-03-01 DIAGNOSIS — I6389 Other cerebral infarction: Secondary | ICD-10-CM | POA: Diagnosis not present

## 2022-03-01 LAB — CBC
HCT: 28.6 % — ABNORMAL LOW (ref 36.0–46.0)
Hemoglobin: 9.2 g/dL — ABNORMAL LOW (ref 12.0–15.0)
MCH: 29.6 pg (ref 26.0–34.0)
MCHC: 32.2 g/dL (ref 30.0–36.0)
MCV: 92 fL (ref 80.0–100.0)
Platelets: 260 10*3/uL (ref 150–400)
RBC: 3.11 MIL/uL — ABNORMAL LOW (ref 3.87–5.11)
RDW: 13.4 % (ref 11.5–15.5)
WBC: 9.5 10*3/uL (ref 4.0–10.5)
nRBC: 0 % (ref 0.0–0.2)

## 2022-03-01 LAB — BASIC METABOLIC PANEL
Anion gap: 5 (ref 5–15)
BUN: 16 mg/dL (ref 8–23)
CO2: 29 mmol/L (ref 22–32)
Calcium: 7.8 mg/dL — ABNORMAL LOW (ref 8.9–10.3)
Chloride: 100 mmol/L (ref 98–111)
Creatinine, Ser: 0.44 mg/dL (ref 0.44–1.00)
GFR, Estimated: >60 mL/min (ref >60)
Glucose, Bld: 201 mg/dL — ABNORMAL HIGH (ref 70–99)
Potassium: 5.1 mmol/L (ref 3.5–5.1)
Sodium: 134 mmol/L — ABNORMAL LOW (ref 135–145)

## 2022-03-01 LAB — GLUCOSE, CAPILLARY
Glucose-Capillary: 175 mg/dL — ABNORMAL HIGH (ref 70–99)
Glucose-Capillary: 176 mg/dL — ABNORMAL HIGH (ref 70–99)
Glucose-Capillary: 187 mg/dL — ABNORMAL HIGH (ref 70–99)
Glucose-Capillary: 187 mg/dL — ABNORMAL HIGH (ref 70–99)
Glucose-Capillary: 187 mg/dL — ABNORMAL HIGH (ref 70–99)
Glucose-Capillary: 196 mg/dL — ABNORMAL HIGH (ref 70–99)
Glucose-Capillary: 204 mg/dL — ABNORMAL HIGH (ref 70–99)

## 2022-03-01 LAB — PHOSPHORUS: Phosphorus: 3.5 mg/dL (ref 2.5–4.6)

## 2022-03-01 LAB — MAGNESIUM: Magnesium: 2.1 mg/dL (ref 1.7–2.4)

## 2022-03-01 IMAGING — CT CT HEAD W/O CM
4 series · 16 of 47 positions shown, 18 images · non-contrast
Comparison: None.

CLINICAL DATA: Mental status change, status post left carotid stent
placement.



[Series 2: head wo · axial · 0.42mm/px · z∈[+932,+1062]mm · 7 of 36 slices shown, 9 images]
[im 5/36  brain]
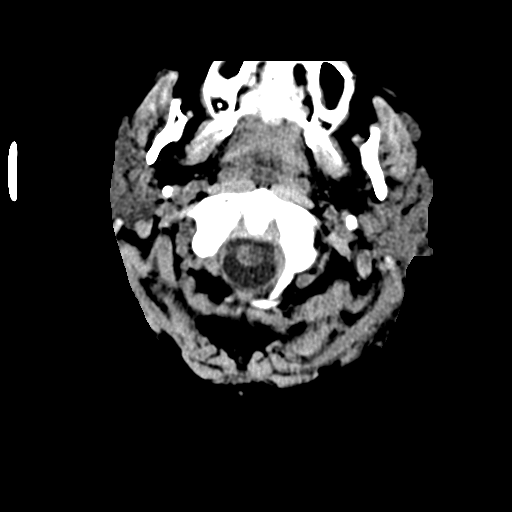
[im 5/36  bone]
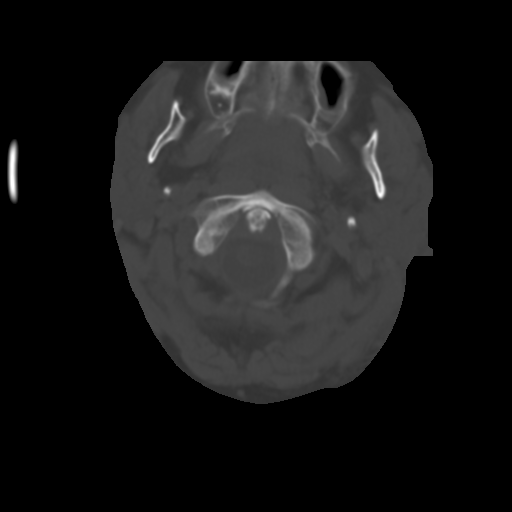
[im 9/36  brain]
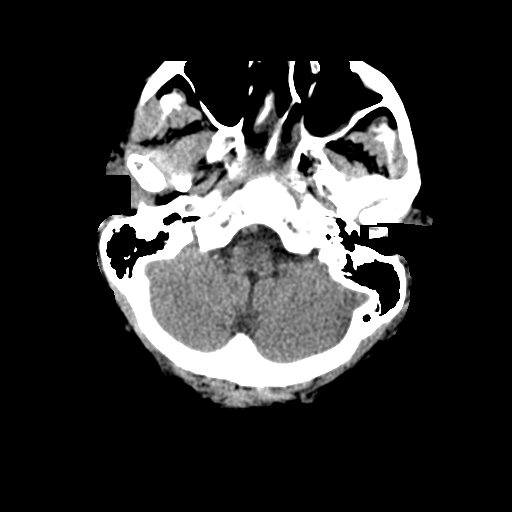
[im 14/36  brain]
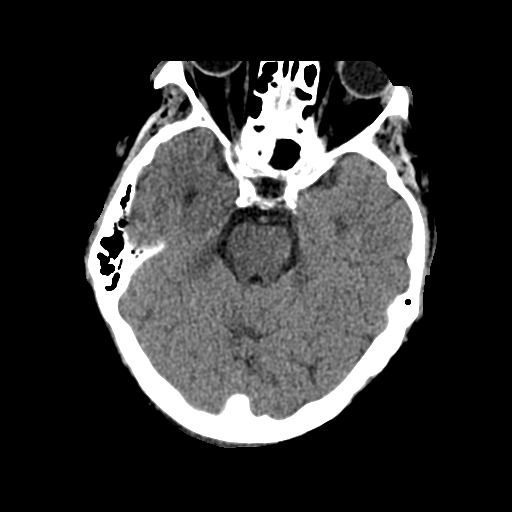
[im 18/36  brain]
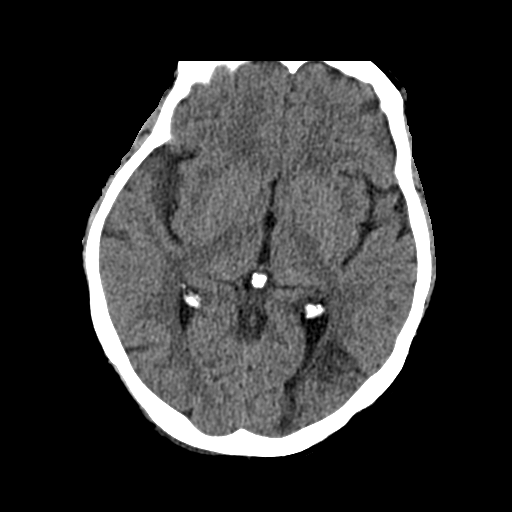
[im 22/36  brain]
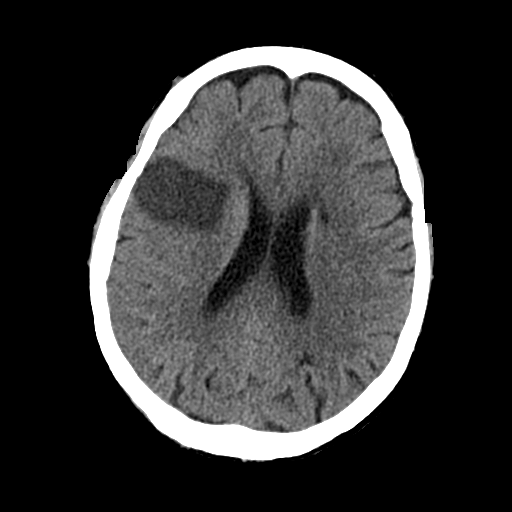
[im 22/36  bone]
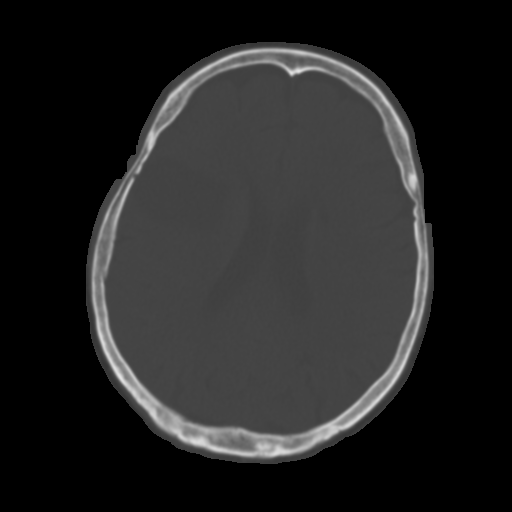
[im 27/36  brain]
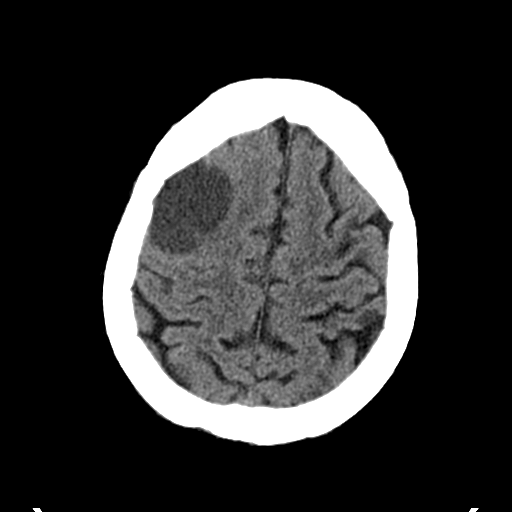
[im 31/36  brain]
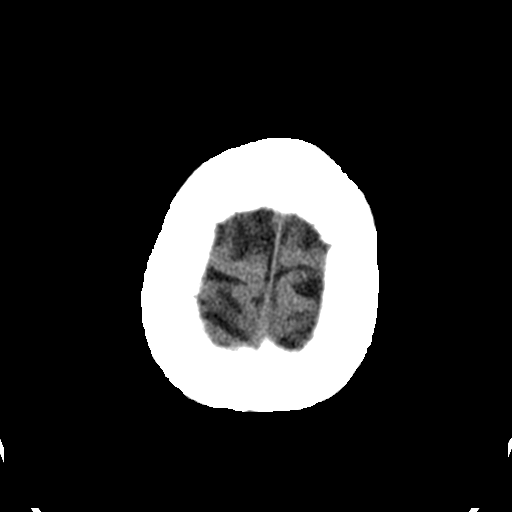

[Series 3: head bone · axial · 0.42mm/px · z∈[+928,+964]mm · 3 of 88 slices shown]
[im 9/88  bone]
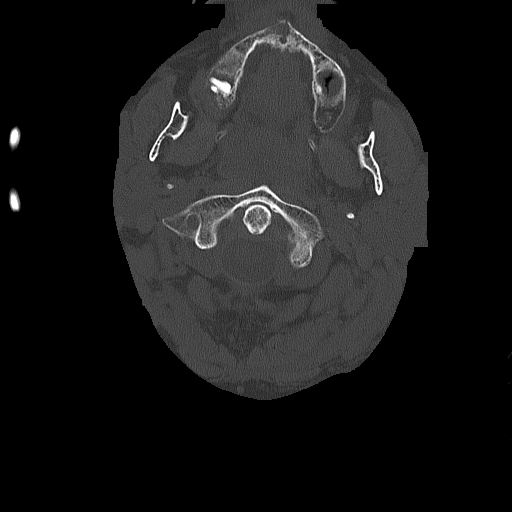
[im 18/88  bone]
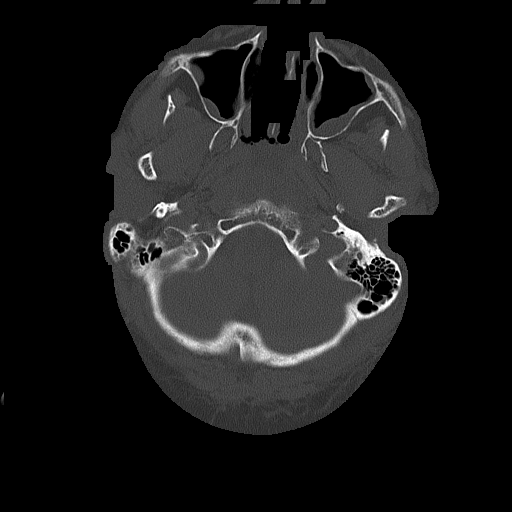
[im 27/88  bone]
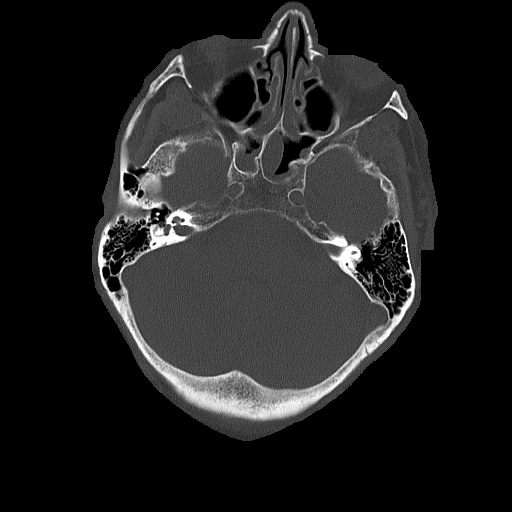

[Series 4: coronal soft tissue · coronal · 0.32mm/px · 3 of 70 slices shown]
[im 24/70  brain]
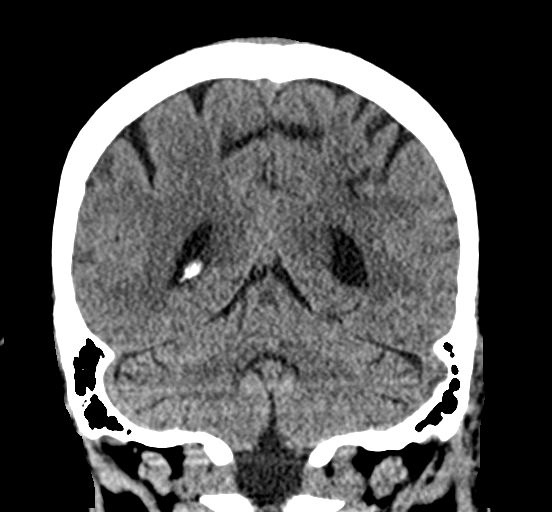
[im 31/70  brain]
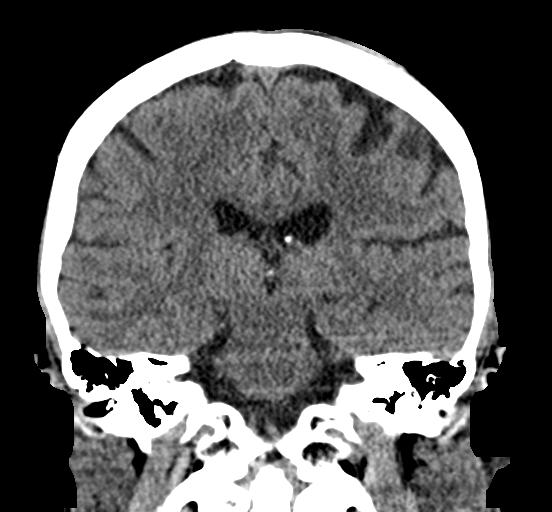
[im 39/70  brain]
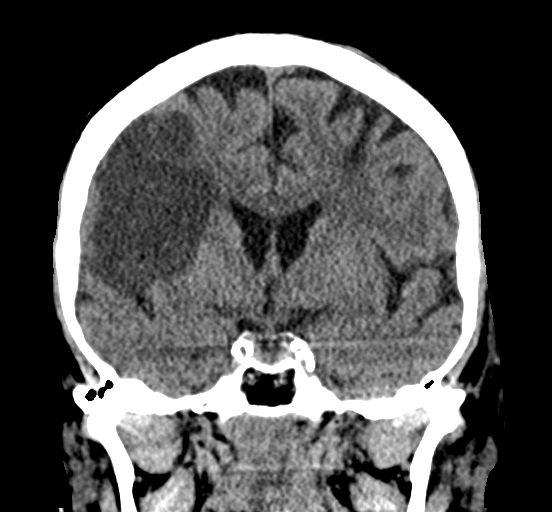

[Series 5: sagittal soft tissue · sagittal · 0.32mm/px · 3 of 60 slices shown]
[im 20/60  brain]
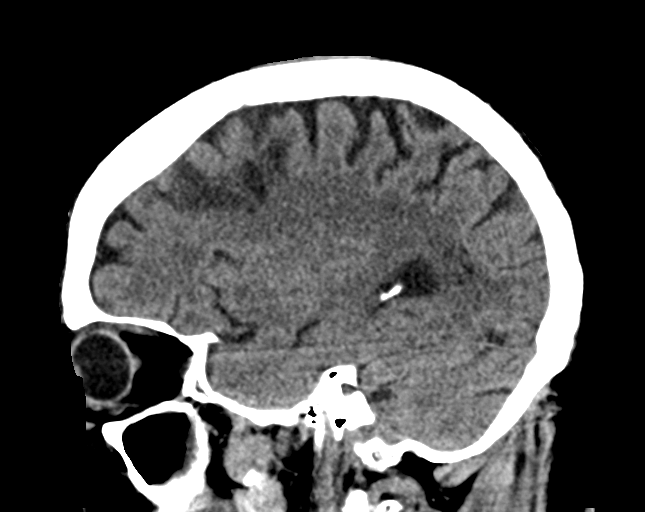
[im 30/60  brain]
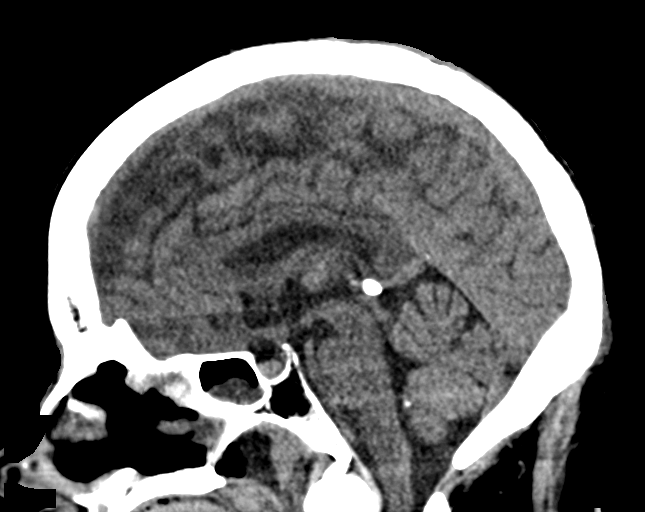
[im 40/60  brain]
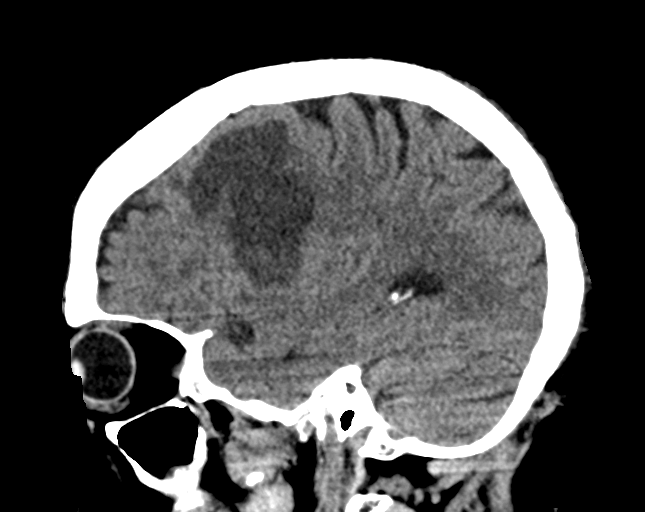

[16 of 47 positions shown; findings below may reference images not displayed]

FINDINGS: Brain: Area of hypodensity in the right insula, external capsule,
and frontal lobe, with sulcal effacement, concerning for right MCA
superior division territory infarct with associated edema. Minimal
high density within this area may indicate petechial hemorrhage. No
significant mass effect or midline shift. No midline shift.
Additional wedge-shaped area of hypodensity in the left frontal lobe
is likely a more remote infarct.

Vascular: No hyperdense vessel.

Skull: Normal. Negative for fracture or focal lesion.

Sinuses/Orbits: Mucosal thickening throughout the paranasal sinuses.
The orbits are unremarkable.

Other: The mastoids are well aerated.
IMPRESSION: 1. Acute right MCA superior division territory infarct, involving
the right insula, external capsule, and frontal lobe. Associated
edema and sulcal effacement but no significant mass effect or
midline shift. No evidence of hemorrhagic transformation.
2. Left frontal lobe hypodensity, consistent with more remote
infarct.

These results will be called to the ordering clinician or
representative by the Radiologist Assistant, and communication
documented in the PACS or [REDACTED].

## 2022-03-01 MED ORDER — DEXMEDETOMIDINE HCL IN NACL 400 MCG/100ML IV SOLN
0.4000 ug/kg/h | INTRAVENOUS | Status: DC
  Administered 2022-03-01 – 2022-03-02 (×2): 0.4 ug/kg/h via INTRAVENOUS
  Filled 2022-03-01 (×2): qty 100

## 2022-03-01 MED FILL — Fentanyl Citrate Preservative Free (PF) Inj 100 MCG/2ML: INTRAMUSCULAR | Qty: 0.25 | Status: AC

## 2022-03-01 NOTE — Progress Notes (Signed)
PHARMACY CONSULT NOTE - FOLLOW UP ? ?Pharmacy Consult for Electrolyte Monitoring and Replacement  ? ?Recent Labs: ?Potassium (mmol/L)  ?Date Value  ?03/01/2022 5.1  ? ?Magnesium (mg/dL)  ?Date Value  ?03/01/2022 2.1  ? ?Calcium (mg/dL)  ?Date Value  ?03/01/2022 7.8 (L)  ? ?Albumin (g/dL)  ?Date Value  ?02/25/2022 2.3 (L)  ? ?Phosphorus (mg/dL)  ?Date Value  ?02/28/2022 2.9  ? ?Sodium (mmol/L)  ?Date Value  ?03/01/2022 134 (L)  ? ?Corrected Calcium: 9.16 mg/dl ? ?Assessment: ?66 year old female presenting with ruptured carotic endarterectomy wound. 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 Patient is intubated and sedated in the ICU. Pharmacy has been consulted to manage electrolytes. ? ?Renal function consistent with baseline. ? ?On propofol infusion and fentanyl boluses. Norepinephrine at low dose to maintain MAPs. Tube feeds ongoing at 45 ml/hr with basal and bolus insulin.  ? ?Goal of Therapy:  ?Electrolytes within normal limits ?K 4-5 ?Mag > 2 ? ?Plan:  ?K slightly elevated. Will monitor. ?Follow up electrolytes in the AM ? ?Jaynie Bream, PharmD ?Pharmacy Resident  ?03/01/2022 ?7:34 AM ?

## 2022-03-01 NOTE — Consult Note (Addendum)
?                    NEURO HOSPITALIST CONSULT NOTE  ? ?Requestig physician: Dr. Patsey Berthold ? ?Reason for Consult: Acute right MCA stroke ? ?History obtained from:  Family and Chart    ? ?HPI:                                                                                                                                         ? Veronica Murray is an 66 y.o. female with a PMHx of recent left CEA on 01/25/22 with CorMatrix arterial patch reconstruction.(performed for high grade stenosis diagnosed during admission for amaurosis fugax/TIA) complicated by wound infection with symptoms of such first noted on 4/12, left carotid stent, DM, HTN, tobacco abuse and fibromyalgia who presented to [facility] on 4/17 after significant bleeding from her wound site occurred, in conjunction with hypotension. She was diagnosed with LEFT Carotid artery blow out secondary to the infection and resultant failure/blowout of CorMatrix carotid artery patch with active bleed at the site of the 3/17 LEFT CEA. She was taken emergently to the OR for left neck exploration and carotid artery repair; there were limited options per surgical note given vessel friability and recent scarring. The patch was excised and suture repair with reconstruction of the carotid artery was undertaken as that was the last available option. She has been intubated since the procedure on 4/17. On 4/20 she was taken back to the OR for placement of a left carotid stent due to stenosis at the site of the repair 3 days earlier.  ? ?ID has seen the patient and per their consult note, the patient has been diagnosed with Streptococcus constellatus endovascular infection, to be treated with atleast 6 weeks IV ABX followed by PO.  ? ?Today, the patient failed awake assessment and CT head was obtained, revealing a new large subacute MCA infarct involving the insula, external capsule, cortex and subcortical/deep white matter of the right frontal lobe; there was some associated edema  and sulcal effacement but no mass effect. Two remote left MCA infarcts were also noted.  ? ?She is on ASA and Plavix post-stenting.  ? ? ?Past Medical History:  ?Diagnosis Date  ? Aortic arch atherosclerosis ([place])   ? Diabetes mellitus without complication ([place])   ? Essential hypertension   ? Fibromyalgia   ? Tobacco abuse   ? ? ?Past Surgical History:  ?Procedure Laterality Date  ? CAROTID PTA/STENT INTERVENTION Left 01/24/2022  ? Procedure: CAROTID PTA/STENT INTERVENTION;  Surgeon: Algernon Huxley, MD;  Location: [place] [facility];  Service: Cardiovascular;  Laterality: Left;  ? CAROTID PTA/STENT INTERVENTION Left 02/28/2022  ? Procedure: CAROTID PTA/STENT INTERVENTION;  Surgeon: Algernon Huxley, MD;  Location: [place] [facility];  Service: Cardiovascular;  Laterality: Left;  ? CERVICAL BIOPSY    ? CHOLECYSTECTOMY    ?  ENDARTERECTOMY Left 01/25/2022  ? Procedure: ENDARTERECTOMY CAROTID, possible ligation;  Surgeon: Algernon Huxley, MD;  Location: [facility];  Service: Vascular;  Laterality: Left;  ? ENDARTERECTOMY Left 02/25/2022  ? Procedure: ENDARTERECTOMY CAROTID revision;  Surgeon: Evaristo Bury, MD;  Location: [facility] OR[facility];  Service: Vascular;  Laterality: Left;  ? HERNIA REPAIR    ? LEFT HEART CATH AND CORONARY ANGIOGRAPHY Left 07/05/2019  ? Procedure: LEFT HEART CATH AND CORONARY ANGIOGRAPHY;  Surgeon: Wellington Hampshire, MD;  Location: [place] [facility];  Service: Cardiovascular;  Laterality: Left;  ? ? ?Family History  ?Problem Relation Age of Onset  ? CAD Neg Hx   ?      ? ?Social History:  reports that she has been smoking cigarettes. She has a 75.00 pack-year smoking history. She has never used smokeless tobacco. She reports that she does not currently use alcohol. She reports current drug use. Frequency: 7.00 times per week. Drug: Marijuana. ? ?Allergies  ?Allergen Reactions  ? Codeine Hives, Shortness Of Breath and Nausea Only  ? Toradol [Ketorolac Tromethamine] Hives, Shortness Of Breath and Nausea Only  ?  Tramadol Hcl Shortness Of Breath and Nausea Only  ?  "Conflicts with bipolar condition"  ? ? ?MEDICATIONS:                                                                                                                     ?Prior to Admission:  ?Medications Prior to Admission  ?Medication Sig Dispense Refill Last Dose  ? aspirin EC 81 MG EC tablet Take 1 tablet (81 mg total) by mouth daily. Swallow whole. 30 tablet 11 unknown  ? gabapentin (NEURONTIN) 300 MG capsule Take 300 mg by mouth 3 (three) times daily.   unknown  ? LANTUS 100 UNIT/ML injection Inject 20 Units into the skin 2 (two) times daily.     ? rosuvastatin (CRESTOR) 10 MG tablet Take 1 tablet (10 mg total) by mouth daily. 30 tablet 5 unknown  ? apixaban (ELIQUIS) 5 MG TABS tablet Take 1 tablet (5 mg total) by mouth 2 (two) times daily. 60 tablet 2 unknown  ? aspirin EC 81 MG EC tablet Take 1 tablet (81 mg total) by mouth daily. (Patient not taking: Reported on 02/01/2022) 30 tablet 0   ? cephALEXin (KEFLEX) 500 MG capsule Take 1 capsule (500 mg total) by mouth 3 (three) times daily. 21 capsule 0   ? Ibuprofen 200 MG CAPS Take 800 mg by mouth 2 (two) times daily.   unknown at prn  ? Insulin Pen Needle (NOVOFINE) 30G X 8 MM MISC Inject 10 each into the skin as needed. (Patient not taking: Reported on 02/01/2022) 100 each 0   ? Menthol, Topical Analgesic, (BIOFREEZE EX) Apply 1 application topically 2 (two) times daily as needed (pain). (Patient not taking: Reported on 02/01/2022)     ? oxyCODONE-acetaminophen (PERCOCET/ROXICET) 5-325 MG tablet Take 1 tablet by mouth every 6 (six) hours as needed for moderate pain. (Patient not taking:  Reported on 02/01/2022) 10 tablet 0   ? ?Scheduled: ? aspirin  81 mg Per Tube Daily  ? chlorhexidine gluconate (MEDLINE KIT)  15 mL Mouth Rinse BID  ? Chlorhexidine Gluconate Cloth  6 each Topical Daily  ? clopidogrel  75 mg Per Tube Daily  ? docusate  100 mg Per Tube BID  ? enoxaparin (LOVENOX) injection  1 mg/kg Subcutaneous  Q12H  ? feeding supplement (PROSource TF)  45 mL Per Tube Daily  ? free water  20 mL Per Tube Q4H  ? insulin aspart  0-20 Units Subcutaneous Q4H  ? insulin aspart  3 Units Subcutaneous Q4H  ? insulin detemir  30 Units Subcutaneous BID  ? mouth rinse  15 mL Mouth Rinse 10 times per day  ? pantoprazole (PROTONIX) IV  40 mg Intravenous Q24H  ? polyethylene glycol  17 g Per Tube Daily  ? sodium chloride flush  10-40 mL Intracatheter Q12H  ? sodium chloride flush  3 mL Intravenous Q12H  ? ?Continuous: ? sodium chloride Stopped (02/26/22 1633)  ? sodium chloride    ? ampicillin-sulbactam (UNASYN) IV 3 g (03/01/22 1250)  ? feeding supplement (VITAL AF 1.2 CAL) Stopped (02/28/22 0000)  ? fentaNYL infusion INTRAVENOUS Stopped (03/01/22 2035)  ? norepinephrine (LEVOPHED) Adult infusion Stopped (02/28/22 0557)  ? ? ? ?ROS:                                                                                                                                       ?Unable to obtain due to obtundation.  ? ? ?Blood pressure (!) 168/57, pulse 80, temperature 98.4 ?F (36.9 ?C), temperature source Esophageal, resp. rate (!) 22, height 5' 2" (1.575 m), weight 90.2 kg, SpO2 100 %. ? ? ?General Examination:                                                                                                      ? ?Physical Exam  ?HEENT-  [place]/AT. Left CEA operative site with dressing in place. No neck stiffness.  ?Lungs- Intubated ?Extremities- Diffuse edema ? ?Neurological Examination ?Mental Status: Intubated, off all sedation. Obtunded. Does not open eyes to voice or light sternal rub, but does open when suctioned. No purposeful movement, but will withdraw BLE weakly to noxious plantar stimulation. No attempts to communicated.  ?Cranial Nerves: ?II: PERRL. No blink to threat.  ?III,IV, VI: Eyes are conjugate near the midline. Oculocephalic reflex intact.  ?V,VII: Corneal reflex intact bilaterally.  ?VIII: No  startle response to loud clapping.   ?IX,X: Intubated ?XI: Head is midline.  ?XII: Intubated ?Motor/Sensory: ?BUE: Decreased tone. No movement spontaneously, to light pinch or to sternal rub, but will move upper extremities reflexively during co

## 2022-03-01 NOTE — Progress Notes (Signed)
? ?NAME:  Veronica Murray, MRN:  [id number], DOB:  1956-03-27, LOS: 4 ?ADMISSION DATE:  02/24/2022, CONSULTATION DATE:  02/25/22 ?REFERRING MD:  Dr. Lorenso Courier, CHIEF COMPLAINT:  carotid artery rupture  ? ?History of Present Illness:  ?66 yo F transferred emergently to [facility] ED from [facility] ED for emergent vascular intervention on a ruptured carotid endarterectomy wound. History obtained from Epic documentation, patient intubated and sedated.  ?Patient underwent LEFT endarterectomy on 01/24/22 at [facility] with Dr. Lucky Cowboy without complication. Follow up appointment on 02/01/22 revealed no complications. Patient visited an urgent care on 02/22/22 with concern for infected endarterectomy site, picture in epic shows redness, edema and drainage around healing surgical site. She was hemodynamically stable and was started on a 3 day course of Keflex, instructed to f/u with provider. ?On 02/24/22 patient brought in via EMS to [facility] ED with reports that surgical site burst open and began bleeding heavily. Per [facility] documentation patient was grey in color on arrival, unable to speak but one word at a time, EMS reporting hypotension with SBP in the 70's on arrival to [facility] ED. ?Patient was transferred to [facility] for emergent vascular intervention. ?ED course: ?At [facility] patient received 3 units of PRBC's & bolus of NS. In transport patient received fentanyl & zofran. Upon arrival at [facility] she was taken immediately to the OR for vascular intervention. ? ?Initial Vitals ([facility] ED): 97.3, 16, 81, 142/69 & 96% on RA ?Significant labs: (Labs/ Imaging personally reviewed) ?Chemistry: Na+:133, K+: 4.2, BUN/Cr.: 17/0.82, Serum CO2/ AG: 21/13, Alk Phos:146, albumin: 2.6, glucose: 444 ?Hematology: WBC: 7.4, Hgb: 9.2 > 8.8, plt: 380  ?Lactic: >9.0 ?COVID-19 & Influenza A/B: pending ?ABG: 7.38/ 42/ 258/ 24.8 ? ?OR: ?Patient transferred ED to ED then emergently to the OR for left neck exploration due to LEFT carotid artery blow out with active bleed s/p LEFT CEA. She  received 2 additional units of PRBC's, intubated and placed on mechanical ventilatory support. Patient underwent LEFT neck exploration, hemostasis, partial cormatrix excision, primary suture repair of common and internal carotid artery with reconstruction. EBL 350. ? ?PCCM consulted for assistance and further management as patient is intubated requiring mechanical ventilatory support due to LEFT carotid artery rupture. Plan for patient to return to the OR later today on 02/25/22 ? ?Pertinent  Medical History  ?T2DM ?HTN ?Fibromyalgia ?Aortic arch atherosclerosis ?Tobacco abuse ?CAD ?Significant Hospital Events: ?Including procedures, antibiotic start and stop dates in addition to other pertinent events   ?02/25/22: Admit to ICU s/p left carotid artery repair in the setting of endarterectomy carotid rupture due to left carotid patch infection remained mechanically intubated postop ?02/28/22: Pt underwent left carotid artery stent placement  ?02/28/22: Will perform SBT with plans for possible extubation  ? ?Interim History / Subjective:  ?No acute events overnight  ? ?Objective   ?Blood pressure 110/67, pulse 72, temperature 98.4 ?F (36.9 ?C), temperature source Esophageal, resp. rate 10, height $RemoveBe'5\' 2"'ONPgqtany$  (1.575 m), weight 90.2 kg, SpO2 99 %. ?CVP:  [6 mmHg-13 mmHg] 12 mmHg  ?Vent Mode: PRVC ?FiO2 (%):  [28 %] 28 % ?Set Rate:  [12 bmp-18 bmp] 12 bmp ?Vt Set:  [400 mL] 400 mL ?PEEP:  [5 cmH20] 5 cmH20 ?Plateau Pressure:  [12 cmH20] 12 cmH20  ? ?Intake/Output Summary (Last 24 hours) at 03/01/2022 0831 ?Last data filed at 03/01/2022 6256 ?Gross per 24 hour  ?Intake 996.86 ml  ?Output 1150 ml  ?Net -153.14 ml  ? ?Filed Weights  ? 02/25/22 0003 02/28/22 0335 03/01/22  0446  ?Weight: 77.1 kg 87.3 kg 90.2 kg  ? ? ?Examination: ?General: Acutely ill appearing female, NAD mechanically intubated ?HEENT: Left neck incision site unable to visualize dressing dry and intact ?Neuro: Sedated, not following commands, opens eyes to voice,  PERRL ?CV: NSR, rrr, no R/G, 2+ radial/2+ distal pulses, no edema  ?Pulm: Clear throughout, even, non labored  ?GI: +BS x4, obese, soft, non distended  ?GU: Indwelling foley draining clear yellow urine ?Skin: Left neck incision site dressing dry and intact  ?Extremities: Normal bulk and tone  ? ?Resolved Hospital Problem list   ?Circulatory Shock ?Mild transaminitis  ? ?Assessment & Plan:  ?Severe carotid atherosclerotic disease requiring left amourasis fugax endarterectomy and matric patch on 01/25/22 ?Left carotid patch infection/bleeding~blood cultures positive for streptococcus constellatus with patch removal and repair 04/17 and left stent placement 04/20 ?- Trend WBC and monitor fever curve ?- Trend PCT ?- Follow cultures ?- ID consulted appreciate input~endovascular infection should be treated at least 6 weeks with IV abx followed by po.  For now will continue unasyn and at discharge change to ceftriaxone  ?- Vascular surgery consulted appreciate input ?- Continue aspirin and plavix  ? ?Post Op Ventilator Management ?PMHx: Tobacco Abuse ?- Full vent support for now: vent settings reviewed and established  ?- Wean PEEP & FiO2 as tolerated, maintain SpO2 > 90% ?- Head of bed elevated 30 degrees, VAP protocol in place ?- Plateau pressures less than 30 cm H20  ?- Intermittent chest x-ray & ABG PRN ?- Daily WUA with SBT as tolerated  ?- Ensure adequate pulmonary hygiene  ?- PAD protocol in place: continue Fentanyl drip  ?- Once extubated pt will need smoking cessation counseling  ? ?HTN ?- Continuous telemetry monitoring ?- Prn labetalol for bp management  ? ?Mild hyponatremia ?- Trend BMP  ?- Replace electrolytes as indicated  ?- Monitor UOP  ? ?Poorly controlled Type 2 Diabetes Mellitus ?- CBG's q4hrs  ?- SSI, scheduled novolog, and levemir  ? ?Best Practice (right click and "Reselect all SmartList Selections" daily)  ?Diet/type: Tube Feeds ?DVT prophylaxis: SCD; enoxaparin  ?GI prophylaxis: PPI ?Lines: Central  line, Arterial Line, and yes and it is still needed ?Foley:  Yes, and it is still needed ?Code Status:  full code ?Last date of multidisciplinary goals of care discussion [03/01/22] ? ?Labs   ?CBC: ?Recent Labs  ?Lab 02/24/22 ?2211 02/24/22 ?2216 02/25/22 ?0443 02/25/22 ?1055 02/26/22 ?4967 02/27/22 ?1547 02/28/22 ?5916 02/28/22 ?1700 03/01/22 ?0427  ?WBC 7.4  --  10.5  --  11.8* 9.7 9.3 10.0 9.5  ?NEUTROABS 2.1  --  8.1*  --  7.7  --   --   --   --   ?HGB 9.2*   < > 11.5*   < > 8.8* 7.3* 8.7* 9.5* 9.2*  ?HCT 27.0*   < > 34.0*   < > 26.2* 22.1* 26.5* 28.8* 28.6*  ?MCV 90.0  --  87.6  --  87.3 89.8 89.8 88.9 92.0  ?PLT 380  --  238  --  274 203 215 272 260  ? < > = values in this interval not displayed.  ? ? ?Basic Metabolic Panel: ?Recent Labs  ?Lab 02/25/22 ?0443 02/25/22 ?1013 02/25/22 ?1653 02/25/22 ?2347 02/26/22 ?3846 02/27/22 ?0427 02/28/22 ?6599 02/28/22 ?1700 03/01/22 ?0427  ?NA 132*   < >  --  134* 135 129* 133*  --  134*  ?K 5.2*   < >  --  3.9 3.9 4.1 3.7  --  5.1  ?CL 98   < >  --  103 102 101 103  --  100  ?CO2 26   < >  --  $R'25 25 26 26  'Hy$ --  29  ?GLUCOSE 490*   < >  --  149* 200* 244* 162*  --  201*  ?BUN 15   < >  --  $R'18 20 22 19  'WV$ --  16  ?CREATININE 0.58   < >  --  0.79 0.70 0.48 0.37* 0.40* 0.44  ?CALCIUM 7.8*   < >  --  7.8* 7.9* 7.3* 7.3*  --  7.8*  ?MG 1.4*  --  2.4  --  2.4 2.0 1.7  --  2.1  ?PHOS 4.5  --   --   --  4.9* 3.4 2.9  --   --   ? < > = values in this interval not displayed.  ? ?GFR: ?Estimated Creatinine Clearance: 75.1 mL/min (by C-G formula based on SCr of 0.44 mg/dL). ?Recent Labs  ?Lab 02/24/22 ?2251 02/25/22 ?0443 02/26/22 ?6986 02/27/22 ?0427 02/27/22 ?1547 02/28/22 ?1483 02/28/22 ?1700 03/01/22 ?0427  ?PROCALCITON  --  <0.10 <0.10 <0.10  --   --   --   --   ?WBC  --  10.5 11.8*  --  9.7 9.3 10.0 9.5  ?LATICACIDVEN >9.0* 0.8  --   --   --   --   --   --   ? ? ?Liver Function Tests: ?Recent Labs  ?Lab 02/24/22 ?2211 02/25/22 ?0443  ?AST 19 13*  ?ALT 17 10  ?ALKPHOS 146* 103   ?BILITOT 0.1* 0.4  ?PROT 6.4* 5.2*  ?ALBUMIN 2.6* 2.3*  ? ?No results for input(s): LIPASE, AMYLASE in the last 168 hours. ?No results for input(s): AMMONIA in the last 168 hours. ? ?ABG ?   ?Component Value Date/Time

## 2022-03-01 NOTE — Anesthesia Postprocedure Evaluation (Signed)
Anesthesia Post Note ? ?Patient: Veronica Murray ? ?Procedure(s) Performed: CAROTID PTA/STENT INTERVENTION (Left) ? ?Patient location during evaluation: SICU ?Anesthesia Type: General ?Level of consciousness: sedated and patient remains intubated per anesthesia plan ?Pain management: pain level controlled ?Vital Signs Assessment: post-procedure vital signs reviewed and stable ?Respiratory status: patient remains intubated per anesthesia plan ?Cardiovascular status: stable ?Postop Assessment: no apparent nausea or vomiting ?Anesthetic complications: no ? ? ?There were no known notable events for this encounter. ? ? ?Last Vitals:  ?Vitals:  ? 03/01/22 0600 03/01/22 0700  ?BP: (!) 84/53   ?Pulse: 72 70  ?Resp: 11 12  ?Temp: 36.8 ?C 36.7 ?C  ?SpO2: 99% 99%  ?  ?Last Pain:  ?Vitals:  ? 03/01/22 0000  ?TempSrc: Esophageal  ? ? ?  ?  ?  ?  ?  ?  ? ?Lynden Oxford ? ? ? ? ?

## 2022-03-01 NOTE — Progress Notes (Addendum)
? ?  Date of Admission:  02/24/2022    ?ID: Veronica Murray is a 66 y.o. female  ?Principal Problem: ?  Post-operative state ?Active Problems: ?  Rupture of carotid artery (HCC) ? ? ? ?Subjective: ?Remains intubated, sedated ? ?Medications:  ? aspirin  81 mg Per Tube Daily  ? chlorhexidine gluconate (MEDLINE KIT)  15 mL Mouth Rinse BID  ? Chlorhexidine Gluconate Cloth  6 each Topical Daily  ? clopidogrel  75 mg Per Tube Daily  ? docusate  100 mg Per Tube BID  ? enoxaparin (LOVENOX) injection  1 mg/kg Subcutaneous Q12H  ? feeding supplement (PROSource TF)  45 mL Per Tube Daily  ? free water  20 mL Per Tube Q4H  ? insulin aspart  0-20 Units Subcutaneous Q4H  ? insulin aspart  3 Units Subcutaneous Q4H  ? insulin detemir  30 Units Subcutaneous BID  ? mouth rinse  15 mL Mouth Rinse 10 times per day  ? pantoprazole (PROTONIX) IV  40 mg Intravenous Q24H  ? polyethylene glycol  17 g Per Tube Daily  ? sodium chloride flush  10-40 mL Intracatheter Q12H  ? sodium chloride flush  3 mL Intravenous Q12H  ? ? ?Objective: ?Vital signs in last 24 hours: ?Temp:  [97.9 ?F (36.6 ?C)-99 ?F (37.2 ?C)] 98.4 ?F (36.9 ?C) (04/21 0800) ?Pulse Rate:  [70-80] 79 (04/21 0830) ?Resp:  [7-18] 16 (04/21 0830) ?BP: (84-158)/(53-85) 132/71 (04/21 0830) ?SpO2:  [99 %-100 %] 99 % (04/21 0830) ?Arterial Line BP: (118-188)/(41-61) 118/49 (04/21 0700) ?FiO2 (%):  [28 %] 28 % (04/21 0717) ?Weight:  [90.2 kg] 90.2 kg (04/21 0446) ? ?PHYSICAL EXAM:  ?General: intubated   ?Left side of  neck dressing ? ?Lungs:b/la ir entry. ?Heart: s1s2. ?Abdomen: Soft, non-tender,not distended. Bowel sounds normal. No masses ?Extremities: left PICc ?Skin: No rashes or lesions. Or bruising ?Lymph: Cervical, supraclavicular normal. ?Neurologic: cannot be assessed ? ?Lab Results ?Recent Labs  ?  02/28/22 ?0334 02/28/22 ?1700 03/01/22 ?0427  ?WBC 9.3 10.0 9.5  ?HGB 8.7* 9.5* 9.2*  ?HCT 26.5* 28.8* 28.6*  ?NA 133*  --  134*  ?K 3.7  --  5.1  ?CL 103  --  100  ?CO2 26  --  29   ?BUN 19  --  16  ?CREATININE 0.37* 0.40* 0.44  ? ?Microbiology: ?Wound culture strep constellatus ? ? ? 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  ?Infected graft with blow out and hemorrhage ?Patch removal and repair on 4/17 ?Stent placement on 4/20 ?  ?Streptococcus constellatus infection- this is endovascular infection and to be treated with atleast 6 weeks IV followed by Po ?Currently on uansyn- continue.would do ceftriaxone IV as OPAT on discharge ?  ?Anemia has received PRBC multiple units ?  ?DM on insulin ?  ?H/o HTN ?Now intermittently on pressor ?Discussed the management with ehr daughter at bed side ? ?ID will follow her peripherally this weekend- call if needed ? ?  ? ?

## 2022-03-01 NOTE — Progress Notes (Signed)
[facility] ? ?Daily Progress Note ? ? ?Subjective  -  ? ?Patient not waking after removal of sedation this am. On the vent. ? ?Objective ?Vitals:  ? 03/01/22 0828 03/01/22 0830 03/01/22 1216 03/01/22 1247  ?BP: 140/74 132/71  (!) 168/57  ?Pulse: 80 79 80   ?Resp: 18 16 (!) 22   ?Temp:      ?TempSrc:      ?SpO2: 100% 99% 100%   ?Weight:      ?Height:      ? ? ?Intake/Output Summary (Last 24 hours) at 03/01/2022 1303 ?Last data filed at 03/01/2022 1100 ?Gross per 24 hour  ?Intake 874.56 ml  ?Output 1200 ml  ?Net -325.44 ml  ? ? ?PULM  CTAB ?CV  RRR ?VASC  Groin incision C/D/I.  Neck incision with minimal drainage. ? ?Laboratory ?CBC ?   ?Component Value Date/Time  ? WBC 9.5 03/01/2022 0427  ? HGB 9.2 (L) 03/01/2022 0427  ? HCT 28.6 (L) 03/01/2022 0427  ? PLT 260 03/01/2022 0427  ? ? ?BMET ?   ?Component Value Date/Time  ? NA 134 (L) 03/01/2022 0427  ? K 5.1 03/01/2022 0427  ? CL 100 03/01/2022 0427  ? CO2 29 03/01/2022 0427  ? GLUCOSE 201 (H) 03/01/2022 0427  ? BUN 16 03/01/2022 0427  ? CREATININE 0.44 03/01/2022 0427  ? CALCIUM 7.8 (L) 03/01/2022 0427  ? GFRNONAA >60 03/01/2022 0427  ? GFRAA >60 06/26/2019 0434  ? ? ?Assessment/Planning: ?POD #1 s/p left carotid stent ? ?Not waking after removal of sedation ?CT today shows right sided infarct likely several days old ?No new events on the left. ?Has known carotid disease of the right as well, but have not been able to address due to ongoing treatment of symptomatic left carotid stenosis and subsequent bleeding ?Would resume antiplatelet and or anticoagulation therapy at this time ?Keep BP permissively high.  ?May benefit from neuro eval for right sided stroke which appears large. I.e. prognosis ? ? ?Veronica Murray ? ?03/01/2022, 1:03 PM ? ? ? ?  ?

## 2022-03-02 ENCOUNTER — Inpatient Hospital Stay
Admit: 2022-03-02 | Discharge: 2022-03-02 | Disposition: A | Discharge status: Home / Self Care | Payer: Medicare HMO | Attending: Critical Care Medicine | Admitting: Critical Care Medicine

## 2022-03-02 ENCOUNTER — Inpatient Hospital Stay (HOSPITAL_COMMUNITY)
Admit: 2022-03-02 | Discharge: 2022-03-02 | Disposition: A | Discharge status: Home / Self Care | Payer: Medicare HMO | Attending: Critical Care Medicine | Admitting: Critical Care Medicine

## 2022-03-02 ENCOUNTER — Inpatient Hospital Stay: Payer: Medicare HMO

## 2022-03-02 DIAGNOSIS — I6389 Other cerebral infarction: Secondary | ICD-10-CM

## 2022-03-02 LAB — BASIC METABOLIC PANEL
Anion gap: 6 (ref 5–15)
BUN: 15 mg/dL (ref 8–23)
CO2: 31 mmol/L (ref 22–32)
Calcium: 8.3 mg/dL — ABNORMAL LOW (ref 8.9–10.3)
Chloride: 96 mmol/L — ABNORMAL LOW (ref 98–111)
Creatinine, Ser: 0.38 mg/dL — ABNORMAL LOW (ref 0.44–1.00)
GFR, Estimated: >60 mL/min (ref >60)
Glucose, Bld: 177 mg/dL — ABNORMAL HIGH (ref 70–99)
Potassium: 3.6 mmol/L (ref 3.5–5.1)
Sodium: 133 mmol/L — ABNORMAL LOW (ref 135–145)

## 2022-03-02 LAB — AEROBIC/ANAEROBIC CULTURE W GRAM STAIN (SURGICAL/DEEP WOUND): Gram Stain: NONE SEEN

## 2022-03-02 LAB — CBC
HCT: 27.9 % — ABNORMAL LOW (ref 36.0–46.0)
Hemoglobin: 9.4 g/dL — ABNORMAL LOW (ref 12.0–15.0)
MCH: 29.5 pg (ref 26.0–34.0)
MCHC: 33.7 g/dL (ref 30.0–36.0)
MCV: 87.5 fL (ref 80.0–100.0)
Platelets: 322 10*3/uL (ref 150–400)
RBC: 3.19 MIL/uL — ABNORMAL LOW (ref 3.87–5.11)
RDW: 12.9 % (ref 11.5–15.5)
WBC: 8.2 10*3/uL (ref 4.0–10.5)
nRBC: 0 % (ref 0.0–0.2)

## 2022-03-02 LAB — GLUCOSE, CAPILLARY
Glucose-Capillary: 157 mg/dL — ABNORMAL HIGH (ref 70–99)
Glucose-Capillary: 149 mg/dL — ABNORMAL HIGH (ref 70–99)
Glucose-Capillary: 154 mg/dL — ABNORMAL HIGH (ref 70–99)
Glucose-Capillary: 150 mg/dL — ABNORMAL HIGH (ref 70–99)
Glucose-Capillary: 127 mg/dL — ABNORMAL HIGH (ref 70–99)
Glucose-Capillary: 147 mg/dL — ABNORMAL HIGH (ref 70–99)
Glucose-Capillary: 93 mg/dL (ref 70–99)

## 2022-03-02 LAB — ECHOCARDIOGRAM COMPLETE
AR max vel: 2.38 cm2
AV Peak grad: 6.3 mmHg
Ao pk vel: 1.25 m/s
Area-P 1/2: 7.09 cm2
Calc EF: 69.9 %
Height: 62 in
S' Lateral: 3.46 cm
Single Plane A2C EF: 68.7 %
Single Plane A4C EF: 68.5 %
Weight: 3139.35 oz

## 2022-03-02 LAB — PHOSPHORUS: Phosphorus: 3.8 mg/dL (ref 2.5–4.6)

## 2022-03-02 LAB — MAGNESIUM: Magnesium: 1.8 mg/dL (ref 1.7–2.4)

## 2022-03-02 IMAGING — MR MR HEAD W/O CM
11 series · 48 of 48 positions shown · non-contrast
Comparison: Head CT [DATE].

CLINICAL DATA: Stroke, follow-up.

EXAM:
MRI HEAD WITHOUT CONTRAST
TECHNIQUE: Multiplanar, multiecho pulse sequences of the brain and surrounding
structures were obtained without intravenous contrast.

[Series 5: ax dwi_tracew · axial · 3.0mm · 1.80mm/px · z∈[-88,+61]mm · 5 of 48 slices shown]
[im 1/48]
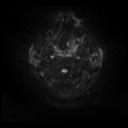
[im 12/48]
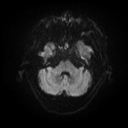
[im 24/48]
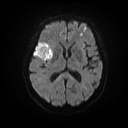
[im 36/48]
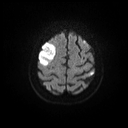
[im 48/48]
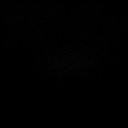

[Series 6: ax dwi_adc · axial · 3.0mm · 1.80mm/px · z∈[-88,+58]mm · 4 of 47 slices shown]
[im 1/47]
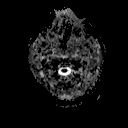
[im 16/47]
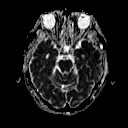
[im 31/47]
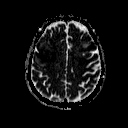
[im 47/47]
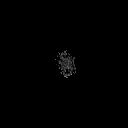

[Series 7: cor dwi_tracew · coronal · 5.0mm · 1.80mm/px · 3 of 36 slices shown]
[im 1/36]
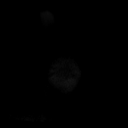
[im 18/36]
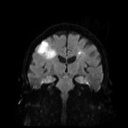
[im 36/36]
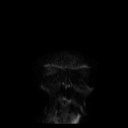

[Series 8: cor dwi_adc · coronal · 5.0mm · 1.80mm/px · 3 of 36 slices shown]
[im 1/36]
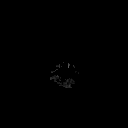
[im 18/36]
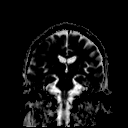
[im 36/36]
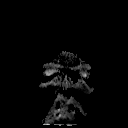

[Series 9: T1 · sagittal · 5.0mm · 0.62mm/px · 2 of 23 slices shown (1 of 2)]
[im 1/23]
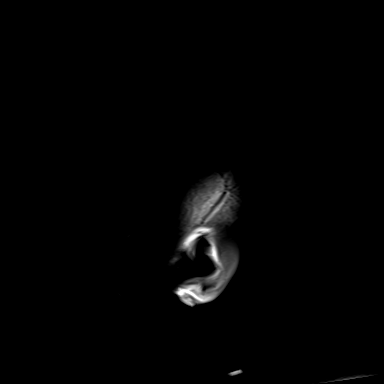
[im 23/23]
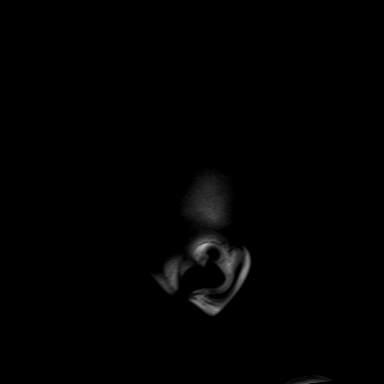

[Series 10: T2 · axial · 5.0mm · 0.86mm/px · z∈[-91,+59]mm · 2 of 27 slices shown (1 of 2)]
[im 1/27]
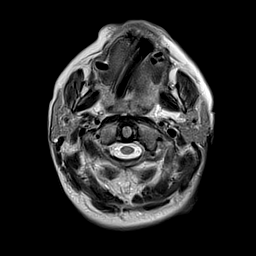
[im 27/27]
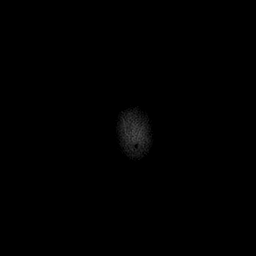

[Series 12: pha_images · axial · 3.0mm · 0.90mm/px · z∈[-88,+59]mm · 4 of 52 slices shown]
[im 1/52]
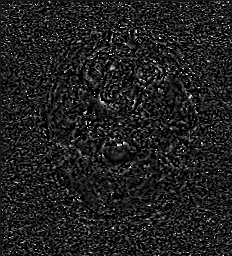
[im 18/52]
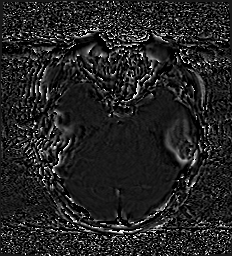
[im 35/52]
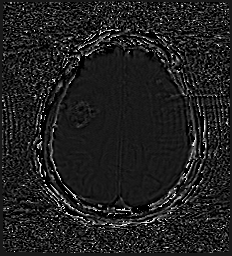
[im 52/52]
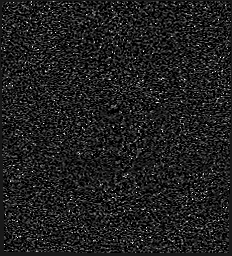

[Series 13: swi_images · axial · 3.0mm · 0.90mm/px · z∈[-88,+59]mm · 4 of 52 slices shown]
[im 1/52]
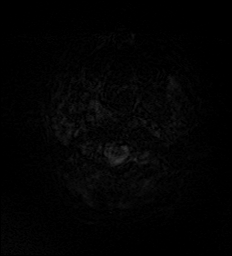
[im 18/52]
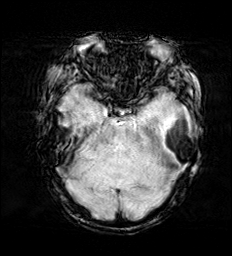
[im 35/52]
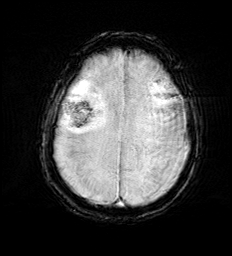
[im 52/52]
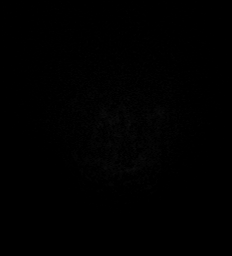

[Series 15: FLAIR · axial · 3.0mm · 0.69mm/px · z∈[-94,+62]mm · 5 of 55 slices shown]
[im 1/55]
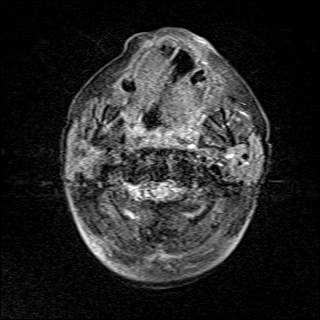
[im 14/55]
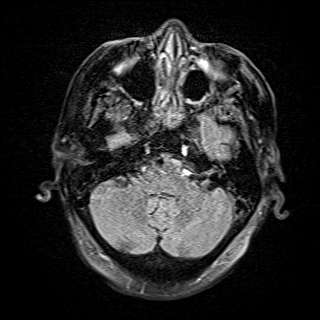
[im 28/55]
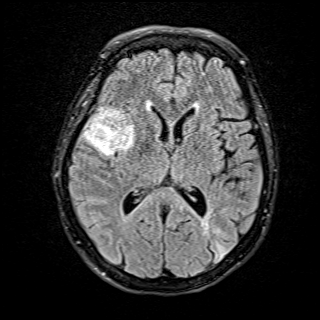
[im 41/55]
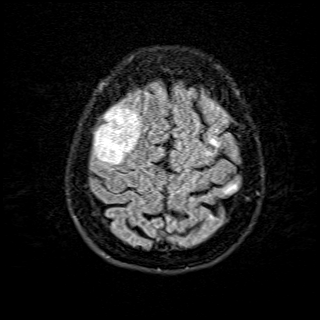
[im 55/55]
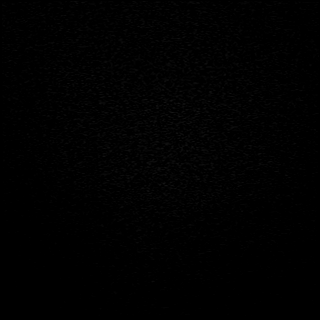

[Series 16: T1 · axial · 1.0mm · 0.98mm/px · z∈[-89,+65]mm · 14 of 160 slices shown (2 of 2)]
[im 1/160]
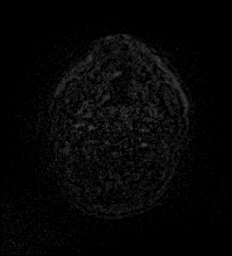
[im 13/160]
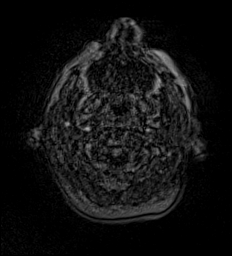
[im 25/160]
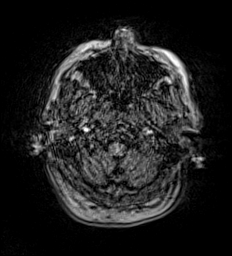
[im 37/160]
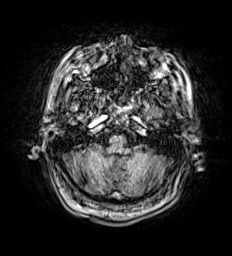
[im 49/160]
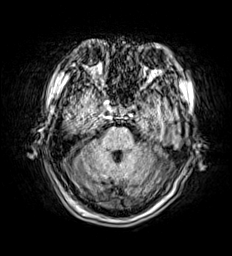
[im 62/160]
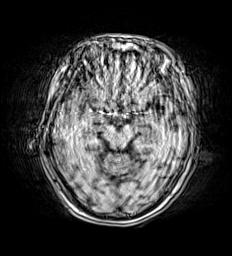
[im 74/160]
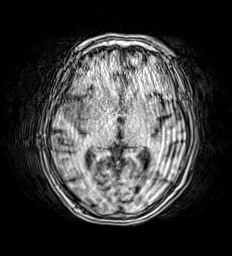
[im 86/160]
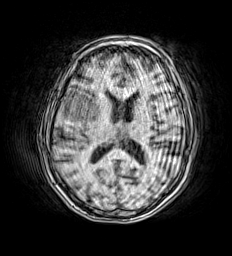
[im 98/160]
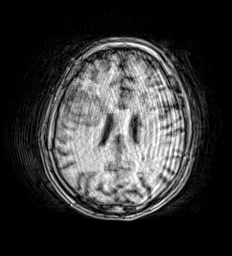
[im 111/160]
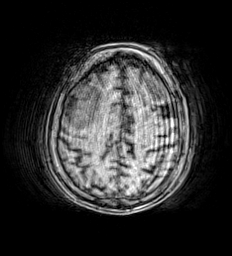
[im 123/160]
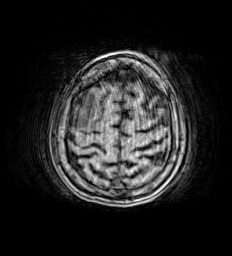
[im 135/160]
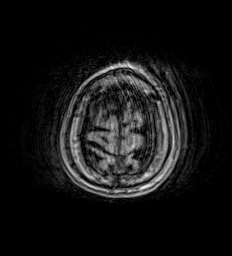
[im 147/160]
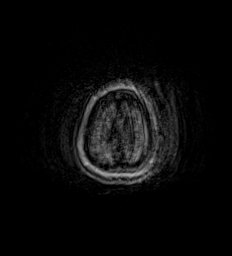
[im 160/160]
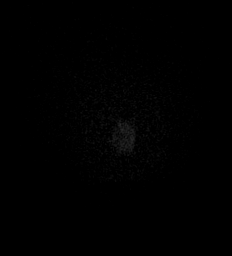

[Series 17: T2 · coronal · 5.0mm · 0.86mm/px · 2 of 29 slices shown (2 of 2)]
[im 1/29]
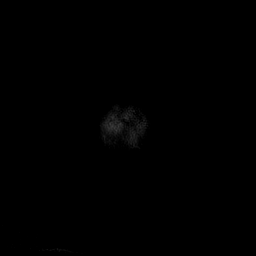
[im 29/29]
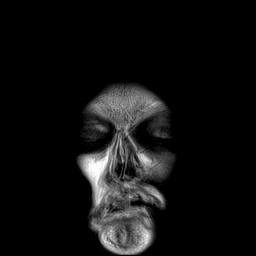

[48 of 48 positions shown; findings below may reference images not displayed]

FINDINGS: Intermittently motion degraded examination, limiting evaluation.
Most notably, there is moderate motion degradation of the sagittal
T1 weighted sequence, moderate motion degradation of the axial SWI
sequence, moderate motion degradation of the axial T2 FLAIR
sequence, severe motion degradation of the axial T1 weighted
sequence and moderate motion degradation of the coronal T2 TSE
sequence.

Brain:

No age advanced or lobar predominant parenchymal atrophy.

Redemonstrated moderate to large acute/early subacute right MCA
territory infarct within the cortical/subcortical right frontal
lobe, right insula and right subinsular region. Associated petechial
hemorrhage at this site. Local mass effect without significant
ventricular effacement. No midline shift.

Additional smaller acute/early subacute cortical and subcortical
infarcts within the left frontal and parietal lobes posterior left
temporal lobe and lateral left occipital lobe friend vomiting the
left MCA vascular territory as well as left MCA/ACA and left MCA/PCA
watershed territories. Petechial hemorrhage also associated with
some of these infarcts. These acute/early subacute infarcts are
superimposed upon chronic left MCA territory and watershed territory
infarcts.

Mild multifocal T2 FLAIR hyperintense signal abnormality elsewhere
in the cerebral white matter, nonspecific but compatible with
chronic small vessel ischemic disease.

No evidence of an intracranial mass.

No extra-axial fluid collection.

Vascular: Maintained flow voids within the proximal large arterial
vessels.

Skull and upper cervical spine: No focal suspicious marrow lesion.

Sinuses/Orbits: Visualized orbits show no acute finding.
Mild-to-moderate mucosal thickening within the left greater than
right ethmoid sinuses. Moderate mucosal thickening within the
bilateral sphenoid sinuses. Mild-to-moderate mucosal thickening
within the bilateral maxillary sinuses.

Other: Trace fluid within the bilateral mastoid air cells.
IMPRESSION: Motion degraded exam.

Redemonstrated moderate-to-large acute/early subacute right MCA
territory infarct within the cortical/subcortical right frontal lobe
as well as right insula and subinsular region. Petechial hemorrhage
at this site. Local mass effect without significant ventricular
effacement. No midline shift.

Multiple smaller acute/early subacute cortical and subcortical
infarcts within the left frontoparietal lobes, posterior left
temporal lobe and lateral left occipital lobe (left MCA vascular
territory, as well as left MCA/ACA and left MCA/PCA watershed
territories). Petechial hemorrhage associated with some of these
infarcts. These infarcts are superimposed upon chronic infarcts
within the left MCA vascular territory and left watershed territory.

Background mild chronic small ischemic changes within the cerebral
white matter.

Paranasal sinus disease, as described.

Trace fluid within the bilateral mastoid air cells.

## 2022-03-02 IMAGING — MR MR MRA NECK WO/W CM
5 series · 43 of 48 positions shown · IV contrast (9ml Gadavist)
Comparison: Same-day brain MRI [DATE].

CLINICAL DATA: Provided history: Carotid artery dissection.
Additional history provided: Recent left carotid endarterectomy.

EXAM:
MRA NECK WITHOUT AND WITH CONTRAST
TECHNIQUE: Multiplanar and multiecho pulse sequences of the neck were obtained
without and with intravenous contrast. Angiographic images of the
neck were obtained using MRA technique without and with intravenous
contrast.
CONTRAST:  10mL GADAVIST GADOBUTROL 1 MMOL/ML IV SOLN

[Series 25: angio_fl3d_cor_pre_ttc=2.0s · coronal · 0.9mm · 0.85mm/px · 11 of 96 slices shown]
[im 1/96]
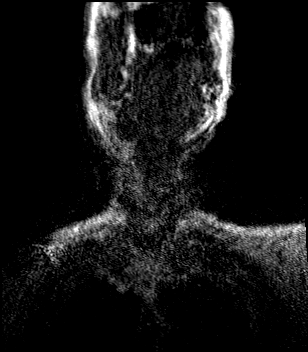
[im 10/96]
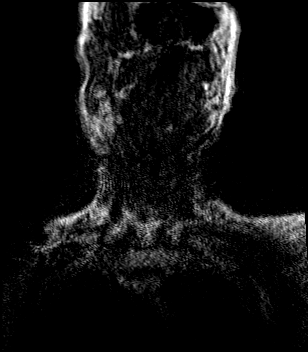
[im 20/96]
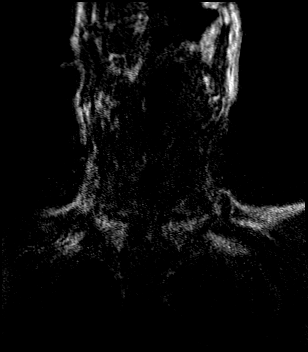
[im 29/96]
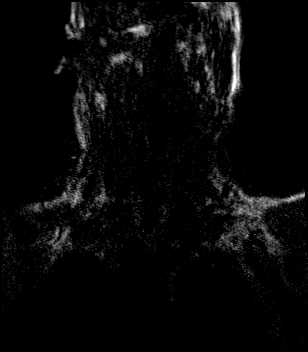
[im 39/96]
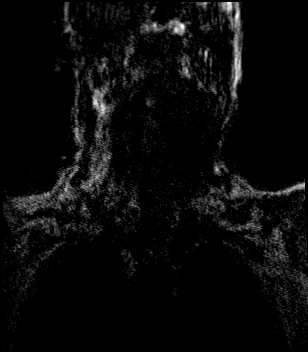
[im 48/96]
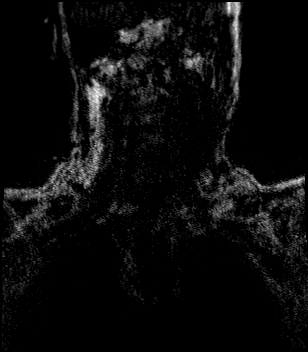
[im 58/96]
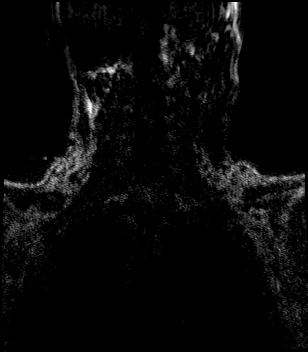
[im 67/96]
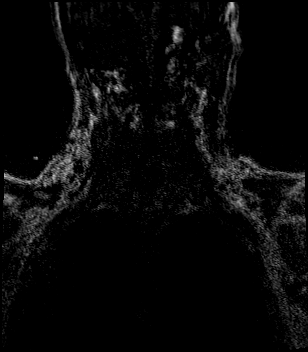
[im 77/96]
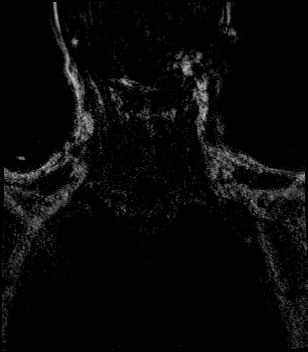
[im 86/96]
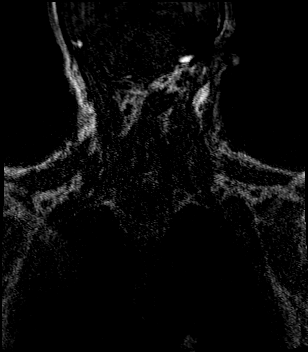
[im 96/96]
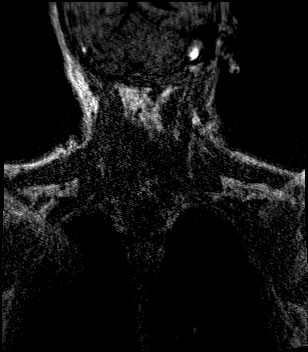

[Series 27: angio_fl3d_cor_post_ttc=2.0s · coronal · 0.9mm · 0.85mm/px · 10 of 96 slices shown]
[im 1/96]
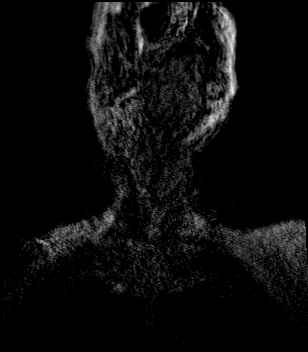
[im 9/96]
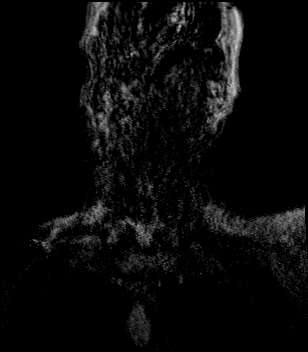
[im 18/96]
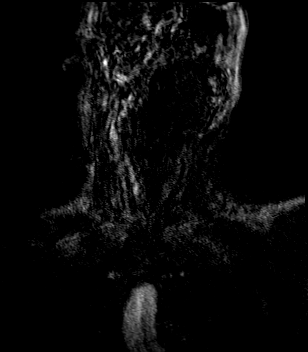
[im 26/96]
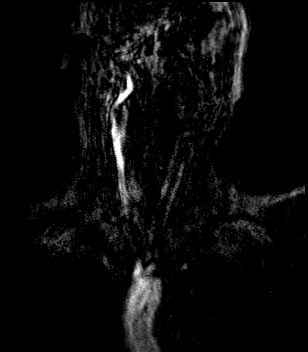
[im 44/96]
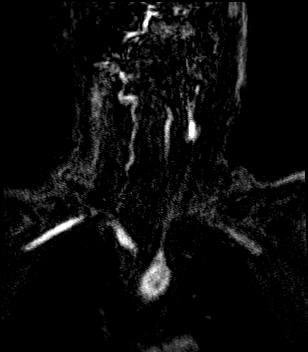
[im 52/96]
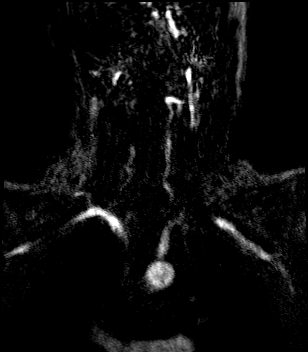
[im 70/96]
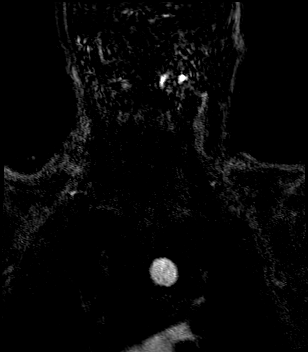
[im 78/96]
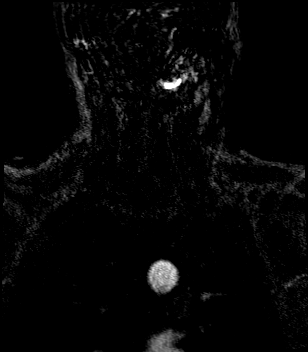
[im 87/96]
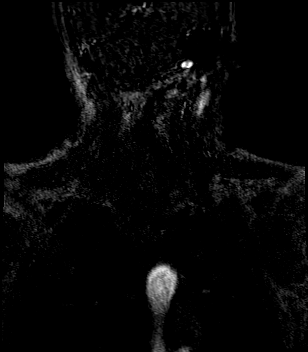
[im 96/96]
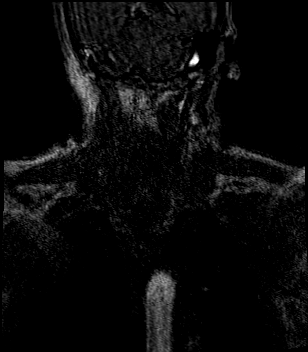

[Series 28: angio_fl3d_cor_post_ttc=2.0s_moco-adv · coronal · 0.9mm · 0.85mm/px · 9 of 96 slices shown]
[im 1/96]
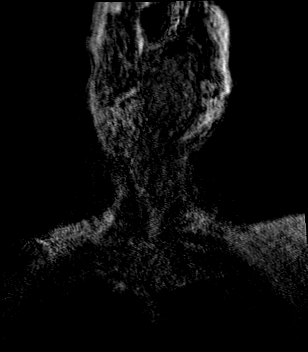
[im 18/96]
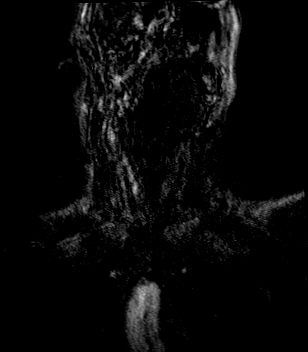
[im 26/96]
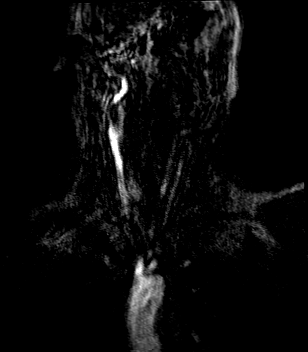
[im 44/96]
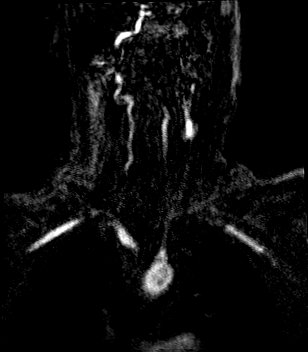
[im 52/96]
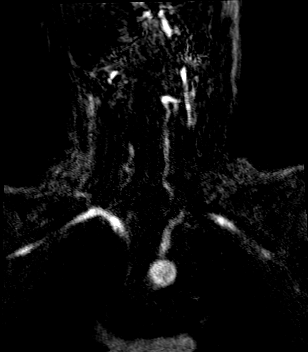
[im 70/96]
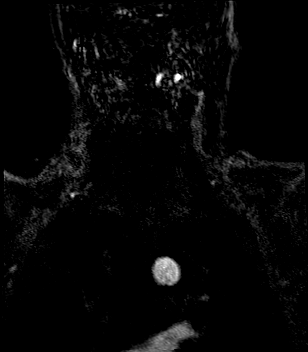
[im 78/96]
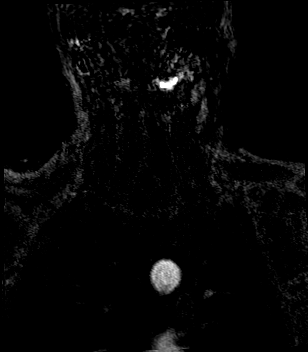
[im 87/96]
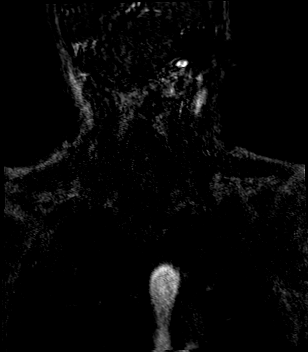
[im 96/96]
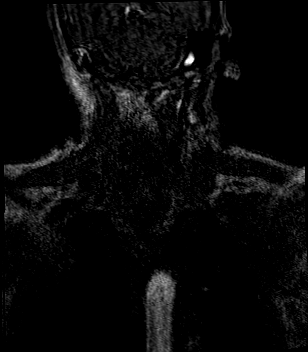

[Series 29: angio_fl3d_cor_post_ttc=2.0s_moco-adv_sub · coronal · 0.9mm · 0.85mm/px · 11 of 90 slices shown]
[im 1/90]
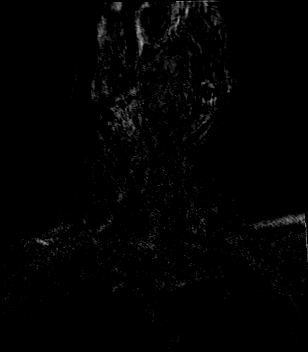
[im 9/90]
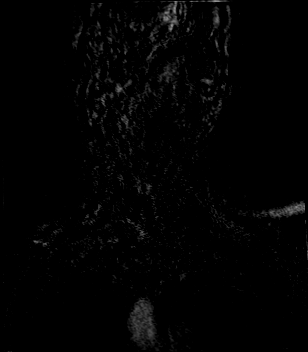
[im 18/90]
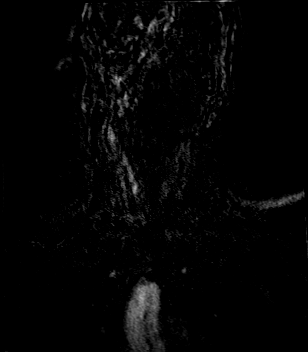
[im 27/90]
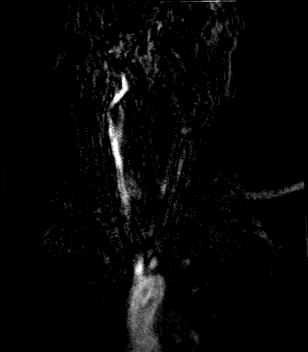
[im 36/90]
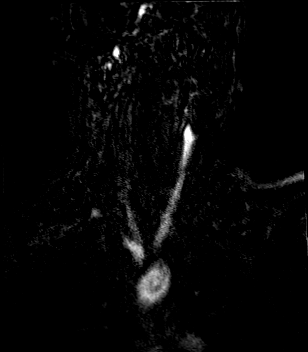
[im 45/90]
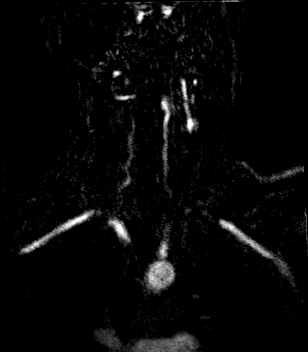
[im 54/90]
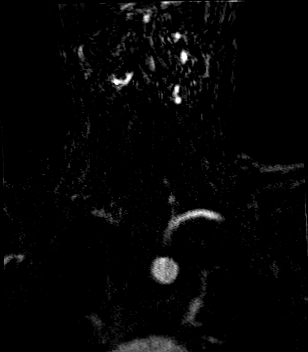
[im 63/90]
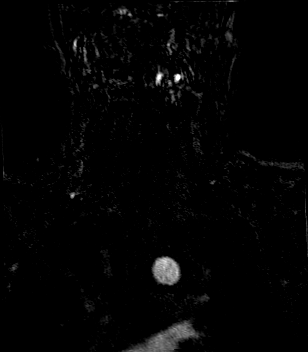
[im 72/90]
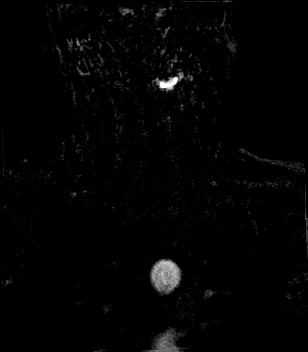
[im 81/90]
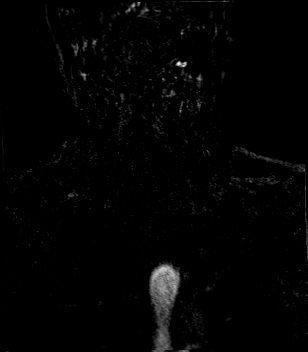
[im 90/90]
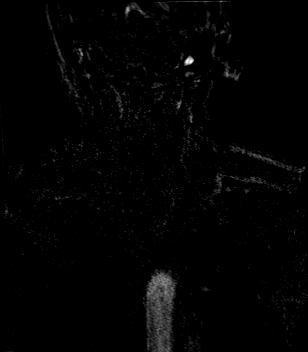

[Series 1041: bifurcation · coronal · 0.9mm · 0.21mm/px · 2 of 19 slices shown]
[im 1/19]
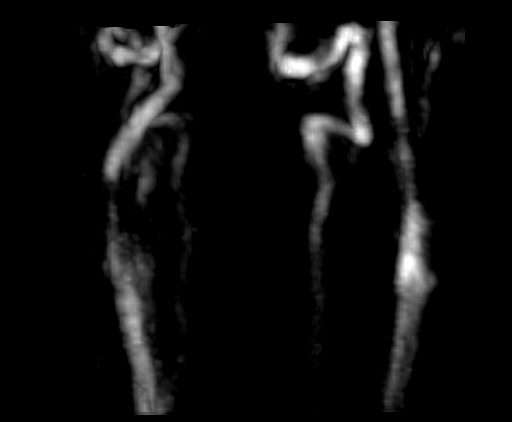
[im 19/19]
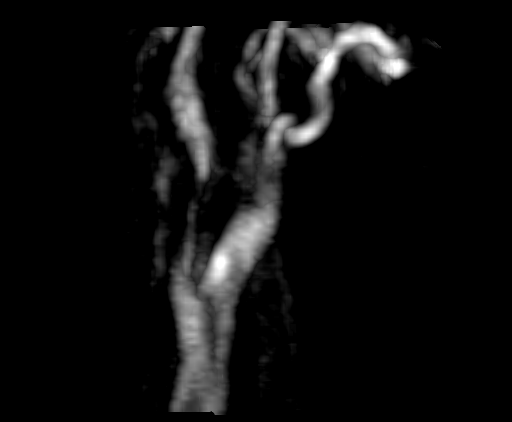

[43 of 48 positions shown; findings below may reference images not displayed]

FINDINGS: The examination is significantly motion degraded, limiting
evaluation.

The common carotid and internal carotid arteries are patent within
neck. Motion degradation precludes adequate evaluation for, and
accurate quantification of, carotid artery stenosis within the neck.
Within the limitations of motion degradation, no definite carotid
artery dissection is appreciated within the neck.

The vertebral arteries are patent within the neck. Motion
degradation precludes adequate evaluation for, an accurate
quantification of, vertebral artery stenoses within the neck. Within
the limitations of motion degradation, no definite vertebral artery
dissection is appreciated within the neck.
IMPRESSION: Significantly motion degraded examination, limiting evaluation.

The common carotid, internal carotid and vertebral arteries are
patent within the neck. Motion degradation precludes adequate
evaluation for, and accurate quantification of, stenoses within
these vessels. No definite carotid or vertebral artery dissection is
identified within the neck, although motion degradation
significantly limits evaluation.

## 2022-03-02 MED ORDER — GADOBUTROL 1 MMOL/ML IV SOLN
9.0000 mL | Freq: Once | INTRAVENOUS | Status: AC | PRN
  Administered 2022-03-02: 10 mL via INTRAVENOUS

## 2022-03-02 MED ORDER — MIDAZOLAM HCL 2 MG/2ML IJ SOLN
1.0000 mg | INTRAMUSCULAR | Status: DC | PRN
  Administered 2022-03-03 (×3): 1 mg via INTRAVENOUS
  Filled 2022-03-02 (×3): qty 2

## 2022-03-02 MED ORDER — MAGNESIUM SULFATE 2 GM/50ML IV SOLN
2.0000 g | Freq: Once | INTRAVENOUS | Status: AC
  Administered 2022-03-02: 2 g via INTRAVENOUS
  Filled 2022-03-02: qty 50

## 2022-03-02 MED ORDER — PANTOPRAZOLE 2 MG/ML SUSPENSION
40.0000 mg | Freq: Every day | ORAL | Status: DC
  Administered 2022-03-03 – 2022-03-13 (×6): 40 mg
  Filled 2022-03-02 (×10): qty 20

## 2022-03-02 MED ORDER — FENTANYL CITRATE PF 50 MCG/ML IJ SOSY
25.0000 ug | PREFILLED_SYRINGE | INTRAMUSCULAR | Status: DC | PRN
  Administered 2022-03-03 – 2022-03-04 (×4): 25 ug via INTRAVENOUS
  Filled 2022-03-02 (×4): qty 1

## 2022-03-02 NOTE — Progress Notes (Signed)
*  PRELIMINARY RESULTS* ?Echocardiogram ?2D Echocardiogram has been performed. ? ?Veronica Murray ?03/02/2022, 3:15 PM ?

## 2022-03-02 NOTE — Progress Notes (Signed)
PHARMACY CONSULT NOTE ? ?Pharmacy Consult for Electrolyte Monitoring and Replacement  ? ?Recent Labs: ?Potassium (mmol/L)  ?Date Value  ?03/02/2022 3.6  ? ?Magnesium (mg/dL)  ?Date Value  ?03/02/2022 1.8  ? ?Calcium (mg/dL)  ?Date Value  ?03/02/2022 8.3 (L)  ? ?Albumin (g/dL)  ?Date Value  ?02/25/2022 2.3 (L)  ? ?Phosphorus (mg/dL)  ?Date Value  ?03/02/2022 3.8  ? ?Sodium (mmol/L)  ?Date Value  ?03/02/2022 133 (L)  ? ? ?Assessment: ?66 year old female presenting with ruptured carotic endarterectomy wound. 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 Patient is intubated and sedated in the ICU. Pharmacy has been consulted to manage electrolytes. ? ?Renal function consistent with baseline. ? ?On propofol infusion and fentanyl boluses. Norepinephrine at low dose to maintain MAPs. Tube feeds ongoing at 55 ml/hr with basal and bolus insulin.  ? ?Goal of Therapy:  ?Electrolytes within normal limits ?K 4-5 ?Mag > 2 ? ?Plan:  ?IV magnesium sulfate 2 g x 1 ?Follow up electrolytes in the AM ? ?Tressie Ellis ?03/02/2022 ?9:17 AM ?

## 2022-03-02 NOTE — Care Plan (Signed)
?  Interdisciplinary Goals of Care Family Meeting ? ? ?Date carried out: 03/02/2022 ? ?Location of the meeting:  ICU waiting room [facility] ? ?Member's involved: Physician, Nurse Practitioner, and Family Member or next of kin (patient's husband, [name], and daughter. ? ?Counsellor: Husband Shabria Egley ? ?Discussion: We discussed goals of care for Veronica Murray .  Remains full code.  Family aware that further assessment cannot be made until patient is able to be extubated ? ?Code status: Full Code ? ?Disposition: Continue current acute care, will get Palliative Care consultation to assist with goals of care discussion.  Family made aware that the patient may need skilled nursing facility versus LTACH at discharge. ? ?Time spent for the meeting: 20 minutes. ? ? ? ?C. Danice Goltz, MD ?Advanced Bronchoscopy ?[facility] ? ? ?03/02/2022, 2:08 PM ? ? ?

## 2022-03-02 NOTE — Progress Notes (Deleted)
Incision to left neck bleeding, gauze saturated. Domingo Pulse, NP notified and in to see patient. New orders to reinforce with ABD pad/pressure dressing, monitor closely and notify vascular MD on call. ?

## 2022-03-02 NOTE — Progress Notes (Addendum)
2200- Patient's vent alarming and O2 saturation down to 82%, patient found with vomit and dressing to left neck was saturated.  Incision was covered with a clean gauze pressure dressing. Tube feed stopped per verbal order from Cheryll Cockayne, NP. ? ?2230-Site started bleeding, saturated gauze and was then reinforced with ABD pad. Cheryll Cockayne, NP notified and at bedside to assess bleeding, advised to reinforce and notify Vascular.  ? ?2300- Dr. Myra Gianotti notified, no new orders at this time, continue to monitor site closely, reinforce as needed.  ? ? ?

## 2022-03-02 NOTE — Progress Notes (Deleted)
Patient's vent alarming and O2 saturation down to 82%, patient found with vomit on front of gown and saturated dressing on left neck. Tube feeds stopped and Cheryll Cockayne, NP notifed. New order to stop tube feeds and connect OG to LWIS, cover incsion with pressure dressing, and monitor incision site, chest xray in AM.  ?

## 2022-03-02 NOTE — Progress Notes (Signed)
? ?NAME:  Veronica Murray, MRN:  [id number], DOB:  1956/01/14, LOS: 5 ?ADMISSION DATE:  02/24/2022, CONSULTATION DATE:  02/25/22 ?REFERRING MD:  Dr. Lorenso Courier, CHIEF COMPLAINT:  carotid artery rupture  ? ?History of Present Illness:  ?66 yo F transferred emergently to [facility] ED from [facility] ED for emergent vascular intervention on a ruptured carotid endarterectomy wound. History obtained from Epic documentation, patient intubated and sedated.  ?Patient underwent LEFT endarterectomy on 01/24/22 at [facility] with Dr. Lucky Cowboy without complication. Follow up appointment on 02/01/22 revealed no complications. Patient visited an urgent care on 02/22/22 with concern for infected endarterectomy site, picture in epic shows redness, edema and drainage around healing surgical site. She was hemodynamically stable and was started on a 3 day course of Keflex, instructed to f/u with provider. ?On 02/24/22 patient brought in via EMS to [facility] ED with reports that surgical site burst open and began bleeding heavily. Per [facility] documentation patient was grey in color on arrival, unable to speak but one word at a time, EMS reporting hypotension with SBP in the 70's on arrival to [facility] ED. ?Patient was transferred to [facility] for emergent vascular intervention. ?ED course: ?At [facility] patient received 3 units of PRBC's & bolus of NS. In transport patient received fentanyl & zofran. Upon arrival at [facility] she was taken immediately to the OR for vascular intervention. ? ?Initial Vitals ([facility] ED): 97.3, 16, 81, 142/69 & 96% on RA ?Significant labs: (Labs/ Imaging personally reviewed) ?Chemistry: Na+:133, K+: 4.2, BUN/Cr.: 17/0.82, Serum CO2/ AG: 21/13, Alk Phos:146, albumin: 2.6, glucose: 444 ?Hematology: WBC: 7.4, Hgb: 9.2 > 8.8, plt: 380  ?Lactic: >9.0 ?COVID-19 & Influenza A/B: pending ?ABG: 7.38/ 42/ 258/ 24.8 ? ?OR: ?Patient transferred ED to ED then emergently to the OR for left neck exploration due to LEFT carotid artery blow out with active bleed s/p LEFT CEA. She  received 2 additional units of PRBC's, intubated and placed on mechanical ventilatory support. Patient underwent LEFT neck exploration, hemostasis, partial cormatrix excision, primary suture repair of common and internal carotid artery with reconstruction. EBL 350. ? ?PCCM consulted for assistance and further management as patient is intubated requiring mechanical ventilatory support due to LEFT carotid artery rupture. Plan for patient to return to the OR later today on 02/25/22 ? ?Pertinent  Medical History  ?T2DM ?HTN ?Fibromyalgia ?Aortic arch atherosclerosis ?Tobacco abuse ?CAD ?Significant Hospital Events: ?Including procedures, antibiotic start and stop dates in addition to other pertinent events   ?02/25/22: Admit to ICU s/p left carotid artery repair in the setting of endarterectomy carotid rupture due to left carotid patch infection remained mechanically intubated postop ?02/28/22: Pt underwent left carotid artery stent placement  ?02/28/22: Will perform SBT with plans for possible extubation.  Pt unable to follow commands during WUA CT Head revealed subacute right MCA stroke as well as recent but older-appearing left MCA strokes x2.  Neurology consulted.  Unable to extubate  ? ?Interim History / Subjective:  ?No acute events overnight  ? ?Objective   ?Blood pressure (!) 150/64, pulse 69, temperature 98.8 ?F (37.1 ?C), resp. rate 19, height $RemoveBe'5\' 2"'AEcskwAyC$  (1.575 m), weight 89 kg, SpO2 100 %. ?CVP:  [3 mmHg-24 mmHg] 11 mmHg  ?Vent Mode: Spontaneous ?FiO2 (%):  [28 %] 28 % ?Set Rate:  [12 bmp] 12 bmp ?Vt Set:  [400 mL] 400 mL ?PEEP:  [5 cmH20] 5 cmH20 ?Pressure Support:  [5 cmH20] 5 cmH20 ?Plateau Pressure:  [7 cmH20-16 cmH20] 7 cmH20  ? ?Intake/Output Summary (Last 24  hours) at 03/02/2022 2841 ?Last data filed at 03/02/2022 0911 ?Gross per 24 hour  ?Intake 898.3 ml  ?Output 1325 ml  ?Net -426.7 ml  ? ?Filed Weights  ? 02/28/22 0335 03/01/22 0446 03/02/22 0359  ?Weight: 87.3 kg 90.2 kg 89 kg  ? ? ?Examination: ?General:  Acutely ill appearing female, NAD mechanically intubated ?HEENT: Left neck incision site unable to visualize dressing dry and intact ?Neuro: Sedated, pt only able to move bilateral feet/toes on command, gag reflex present, opens eyes to voice, PERRL ?CV: NSR, rrr, no R/G, 2+ radial/2+ distal pulses, no edema  ?Pulm: Clear throughout, even, non labored  ?GI: +BS x4, obese, soft, non distended  ?GU: Indwelling foley draining clear yellow urine ?Skin: Left neck incision site dressing dry and intact  ?Extremities: Normal bulk and tone  ? ?Resolved Hospital Problem list   ?Circulatory Shock ?Mild transaminitis  ? ?Assessment & Plan:  ?Severe carotid atherosclerotic disease requiring left amourasis fugax endarterectomy and matric patch on 01/25/22 ?Left carotid patch infection/bleeding~blood cultures positive for streptococcus constellatus with patch removal and repair 04/17 and left stent placement 04/20 ?- Trend WBC and monitor fever curve ?- Trend PCT ?- Follow cultures ?- ID consulted appreciate input~endovascular infection should be treated at least 6 weeks with IV abx followed by po.  For now will continue unasyn and at discharge change to ceftriaxone  ?- Vascular surgery consulted appreciate input ?- Continue aspirin and plavix  ? ?Post Op Ventilator Management ?PMHx: Tobacco Abuse ?- Full vent support for now: vent settings reviewed and established  ?- Wean PEEP & FiO2 as tolerated, maintain SpO2 > 90% ?- Head of bed elevated 30 degrees, VAP protocol in place ?- Plateau pressures less than 30 cm H20  ?- Intermittent chest x-ray & ABG PRN ?- Daily WUA with SBT as tolerated; new acute right MCA stroke revealed on CT Head 04/22 precluding extubation at this time  ?- Ensure adequate pulmonary hygiene  ?- PAD protocol in place: Precedex gtt  ?- Once extubated pt will need smoking cessation counseling  ? ?HTN ?- Continuous telemetry monitoring ?- Prn labetalol for bp management  ? ?Mild hyponatremia ?- Trend BMP  ?-  Replace electrolytes as indicated  ?- Monitor UOP  ? ?Poorly controlled Type 2 Diabetes Mellitus ?- CBG's q4hrs  ?- SSI, scheduled novolog, and levemir  ? ?Acute encephalopathy~CT Head 04/22 revealed acute right MCA superior division territory infarct, involving the right insula, external capsule, and frontal lobe. Associated edema and sulcal effacement but no significant mass effect or midline shift. No evidence of hemorrhagic transformation. Left frontal lobe hypodensity, consistent with more remote infarct ?- Avoid sedating medication  ?- Allow for permissive hypertension ?- Echo pending  ?- Continue aspirin and plavix per vascular surgery recommendations; subq lovenox discontinued due to risk of hemorrhagic transformation of stroke  ?- MRI Brain pending ?- Neurology consulted appreciate input  ?  ?Best Practice (right click and "Reselect all SmartList Selections" daily)  ?Diet/type: Tube Feeds ?DVT prophylaxis: SCD  ?GI prophylaxis: PPI ?Lines: Left brachial PICC line and still needed, Left radial a-line discontinued 04/22 ?Foley:  Yes, and it is still needed ?Code Status:  full code ?Last date of multidisciplinary goals of care discussion [03/01/22] ? ?Attempted to update pts husband Sophiana Milanese via telephone on 04/22, however he did not answer the phone and unable to leave voicemail message because his voicemail is not setup  ?Labs   ?CBC: ?Recent Labs  ?Lab 02/24/22 ?2211 02/24/22 ?2216 02/25/22 ?3244  02/25/22 ?1055 02/26/22 ?1836 02/27/22 ?1547 02/28/22 ?7255 02/28/22 ?1700 03/01/22 ?0427 03/02/22 ?0429  ?WBC 7.4  --  10.5  --  11.8* 9.7 9.3 10.0 9.5 8.2  ?NEUTROABS 2.1  --  8.1*  --  7.7  --   --   --   --   --   ?HGB 9.2*   < > 11.5*   < > 8.8* 7.3* 8.7* 9.5* 9.2* 9.4*  ?HCT 27.0*   < > 34.0*   < > 26.2* 22.1* 26.5* 28.8* 28.6* 27.9*  ?MCV 90.0  --  87.6  --  87.3 89.8 89.8 88.9 92.0 87.5  ?PLT 380  --  238  --  274 203 215 272 260 322  ? < > = values in this interval not displayed.  ? ? ?Basic Metabolic  Panel: ?Recent Labs  ?Lab 02/26/22 ?0016 02/27/22 ?0427 02/28/22 ?4290 02/28/22 ?1700 03/01/22 ?0427 03/02/22 ?0429  ?NA 135 129* 133*  --  134* 133*  ?K 3.9 4.1 3.7  --  5.1 3.6  ?CL 102 101 103  --  100 96*  ?CO

## 2022-03-02 NOTE — Progress Notes (Signed)
? ? ?  Subjective  -  ? ?Remains intubated ? ? ?Physical Exam: ? ?Intubated and sedated.  Some nonpurposeful movement ?Neck incision without expanding hematoma ? ? ? ? ? ?Assessment/Plan:   ? ?-Neurology evaluation pending ?-MRI reveals subacute right MCA territory infarct.  Carotid arteries bilaterally are patent in the neck ?-Continue with permissive hypertension ?-Palliative care consult has been requested for early next week. ?-Critical care has assumed primary responsibility ? ?Durene Cal ?03/02/2022 ?4:57 PM ?-- ? ?Vitals:  ? 03/02/22 1600 03/02/22 1607  ?BP: (!) 173/69   ?Pulse: 84   ?Resp: (!) 25   ?Temp:  98.7 ?F (37.1 ?C)  ?SpO2: 98%   ? ? ?Intake/Output Summary (Last 24 hours) at 03/02/2022 1657 ?Last data filed at 03/02/2022 1600 ?Gross per 24 hour  ?Intake 1848.04 ml  ?Output 1975 ml  ?Net -126.96 ml  ? ? ? ?Laboratory ?CBC ?   ?Component Value Date/Time  ? WBC 8.2 03/02/2022 0429  ? HGB 9.4 (L) 03/02/2022 0429  ? HCT 27.9 (L) 03/02/2022 0429  ? PLT 322 03/02/2022 0429  ? ? ?BMET ?   ?Component Value Date/Time  ? NA 133 (L) 03/02/2022 0429  ? K 3.6 03/02/2022 0429  ? CL 96 (L) 03/02/2022 0429  ? CO2 31 03/02/2022 0429  ? GLUCOSE 177 (H) 03/02/2022 0429  ? BUN 15 03/02/2022 0429  ? CREATININE 0.38 (L) 03/02/2022 0429  ? CALCIUM 8.3 (L) 03/02/2022 0429  ? GFRNONAA >60 03/02/2022 0429  ? GFRAA >60 06/26/2019 0434  ? ? ?COAG ?Lab Results  ?Component Value Date  ? INR 1.1 02/25/2022  ? INR 1.0 02/24/2022  ? INR 0.9 06/25/2019  ? ?No results found for: PTT ? ?Antibiotics ?Anti-infectives (From admission, onward)  ? ? Start     Dose/Rate Route Frequency Ordered Stop  ? 02/28/22 1145  Ampicillin-Sulbactam (UNASYN) 3 g in sodium chloride 0.9 % 100 mL IVPB       ? 3 g ?200 mL/hr over 30 Minutes Intravenous Every 6 hours 02/28/22 1053    ? 02/26/22 1400  metroNIDAZOLE (FLAGYL) IVPB 500 mg  Status:  Discontinued       ? 500 mg ?100 mL/hr over 60 Minutes Intravenous Every 8 hours 02/26/22 1059 02/28/22 1053  ?  02/26/22 0600  vancomycin (VANCOCIN) IVPB 1000 mg/200 mL premix  Status:  Discontinued       ? 1,000 mg ?200 mL/hr over 60 Minutes Intravenous Every 24 hours 02/25/22 0443 02/28/22 1053  ? 02/25/22 0530  vancomycin (VANCOREADY) IVPB 1750 mg/350 mL       ? 1,750 mg ?175 mL/hr over 120 Minutes Intravenous  Once 02/25/22 0441 02/25/22 0801  ? ?  ? ? ? ?V. Charlena Cross, M.D., FACS ?[facility] ?Office: [phone] ?Pager:  [phone]  ?

## 2022-03-03 ENCOUNTER — Inpatient Hospital Stay: Payer: Medicare HMO

## 2022-03-03 DIAGNOSIS — Z9889 Other specified postprocedural states: Secondary | ICD-10-CM | POA: Diagnosis not present

## 2022-03-03 DIAGNOSIS — I639 Cerebral infarction, unspecified: Secondary | ICD-10-CM | POA: Diagnosis not present

## 2022-03-03 LAB — CBC
HCT: 29.7 % — ABNORMAL LOW (ref 36.0–46.0)
Hemoglobin: 10.2 g/dL — ABNORMAL LOW (ref 12.0–15.0)
MCH: 29.8 pg (ref 26.0–34.0)
MCHC: 34.3 g/dL (ref 30.0–36.0)
MCV: 86.8 fL (ref 80.0–100.0)
Platelets: 431 10*3/uL — ABNORMAL HIGH (ref 150–400)
RBC: 3.42 MIL/uL — ABNORMAL LOW (ref 3.87–5.11)
RDW: 12.5 % (ref 11.5–15.5)
WBC: 12 10*3/uL — ABNORMAL HIGH (ref 4.0–10.5)
nRBC: 0 % (ref 0.0–0.2)

## 2022-03-03 LAB — RENAL FUNCTION PANEL
Albumin: 2.4 g/dL — ABNORMAL LOW (ref 3.5–5.0)
Anion gap: 5 (ref 5–15)
BUN: 10 mg/dL (ref 8–23)
CO2: 32 mmol/L (ref 22–32)
Calcium: 8.3 mg/dL — ABNORMAL LOW (ref 8.9–10.3)
Chloride: 93 mmol/L — ABNORMAL LOW (ref 98–111)
Creatinine, Ser: 0.39 mg/dL — ABNORMAL LOW (ref 0.44–1.00)
GFR, Estimated: >60 mL/min (ref >60)
Glucose, Bld: 81 mg/dL (ref 70–99)
Phosphorus: 3.6 mg/dL (ref 2.5–4.6)
Potassium: 3.1 mmol/L — ABNORMAL LOW (ref 3.5–5.1)
Sodium: 130 mmol/L — ABNORMAL LOW (ref 135–145)

## 2022-03-03 LAB — BASIC METABOLIC PANEL
Anion gap: 6 (ref 5–15)
BUN: 12 mg/dL (ref 8–23)
CO2: 34 mmol/L — ABNORMAL HIGH (ref 22–32)
Calcium: 8.3 mg/dL — ABNORMAL LOW (ref 8.9–10.3)
Chloride: 93 mmol/L — ABNORMAL LOW (ref 98–111)
Creatinine, Ser: 0.35 mg/dL — ABNORMAL LOW (ref 0.44–1.00)
GFR, Estimated: >60 mL/min (ref >60)
Glucose, Bld: 129 mg/dL — ABNORMAL HIGH (ref 70–99)
Potassium: 2.7 mmol/L — CL (ref 3.5–5.1)
Sodium: 133 mmol/L — ABNORMAL LOW (ref 135–145)

## 2022-03-03 LAB — GLUCOSE, CAPILLARY
Glucose-Capillary: 102 mg/dL — ABNORMAL HIGH (ref 70–99)
Glucose-Capillary: 131 mg/dL — ABNORMAL HIGH (ref 70–99)
Glucose-Capillary: 159 mg/dL — ABNORMAL HIGH (ref 70–99)
Glucose-Capillary: 66 mg/dL — ABNORMAL LOW (ref 70–99)
Glucose-Capillary: 72 mg/dL (ref 70–99)
Glucose-Capillary: 81 mg/dL (ref 70–99)
Glucose-Capillary: 85 mg/dL (ref 70–99)

## 2022-03-03 LAB — MAGNESIUM: Magnesium: 1.8 mg/dL (ref 1.7–2.4)

## 2022-03-03 LAB — POTASSIUM: Potassium: 3.5 mmol/L (ref 3.5–5.1)

## 2022-03-03 LAB — BRAIN NATRIURETIC PEPTIDE: B Natriuretic Peptide: 154.4 pg/mL — ABNORMAL HIGH (ref 0.0–100.0)

## 2022-03-03 LAB — PHOSPHORUS: Phosphorus: 4 mg/dL (ref 2.5–4.6)

## 2022-03-03 IMAGING — DX DG CHEST 1V PORT
1 series · 1 of 1 positions shown · non-contrast
Comparison: [DATE]

CLINICAL DATA: Vomiting.

EXAM:
PORTABLE CHEST 1 VIEW

[chest ap]
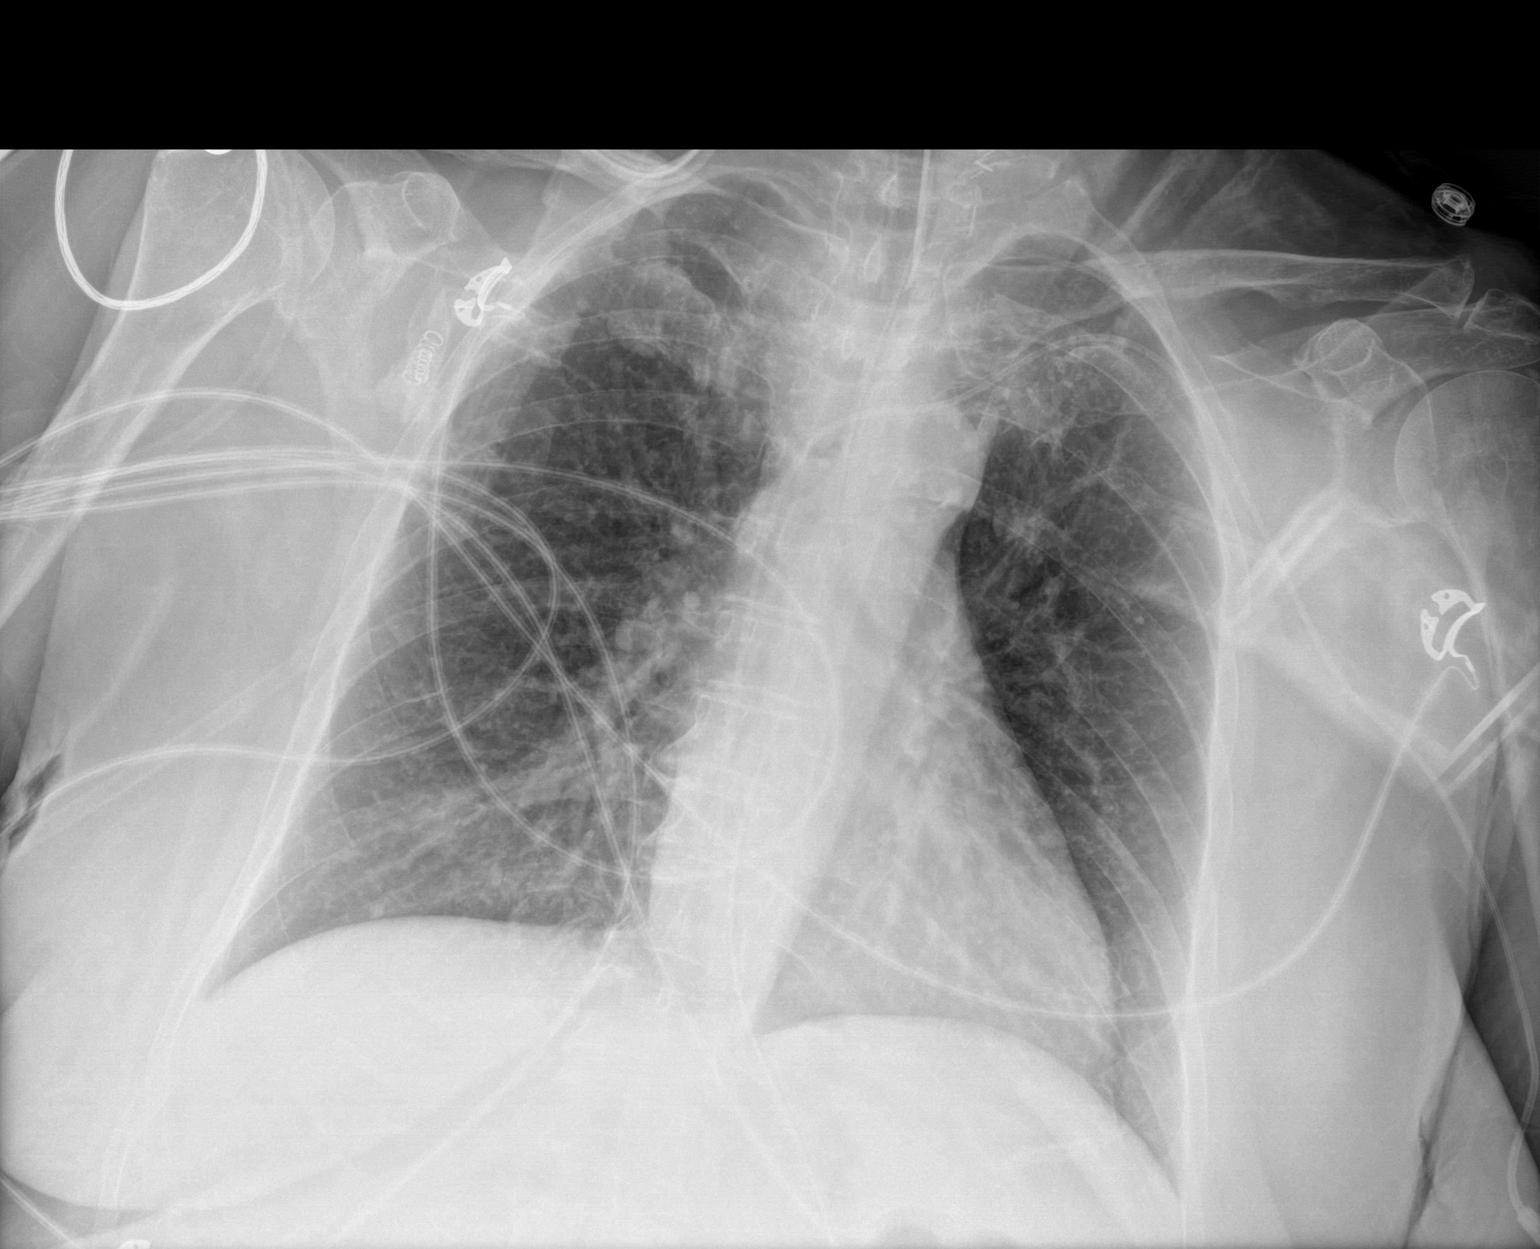

[1 of 1 positions shown; findings below may reference images not displayed]

FINDINGS: The heart size and mediastinal contours are stable. Endotracheal
tube is identified distal tip 8.3 cm from carina. Advancement by 3
cm is recommended. Left central venous line is identified with
distal tip in the superior vena cava. Nasogastric tube is identified
with distal tip not included the film but is at least in the
stomach. Both lungs are clear. The visualized skeletal structures
are stable.
IMPRESSION: 1. No acute cardiopulmonary disease identified.
2. Endotracheal tube is identified distal tip 8.3 cm from carina.
Advancement by 3 cm is recommended.

## 2022-03-03 IMAGING — DX DG ABDOMEN 1V
2 series · 2 of 2 positions shown · non-contrast
Comparison: [DATE]

CLINICAL DATA: Vomiting.

EXAM:
ABDOMEN - 1 VIEW

[abdomen supine (1 of 2)]
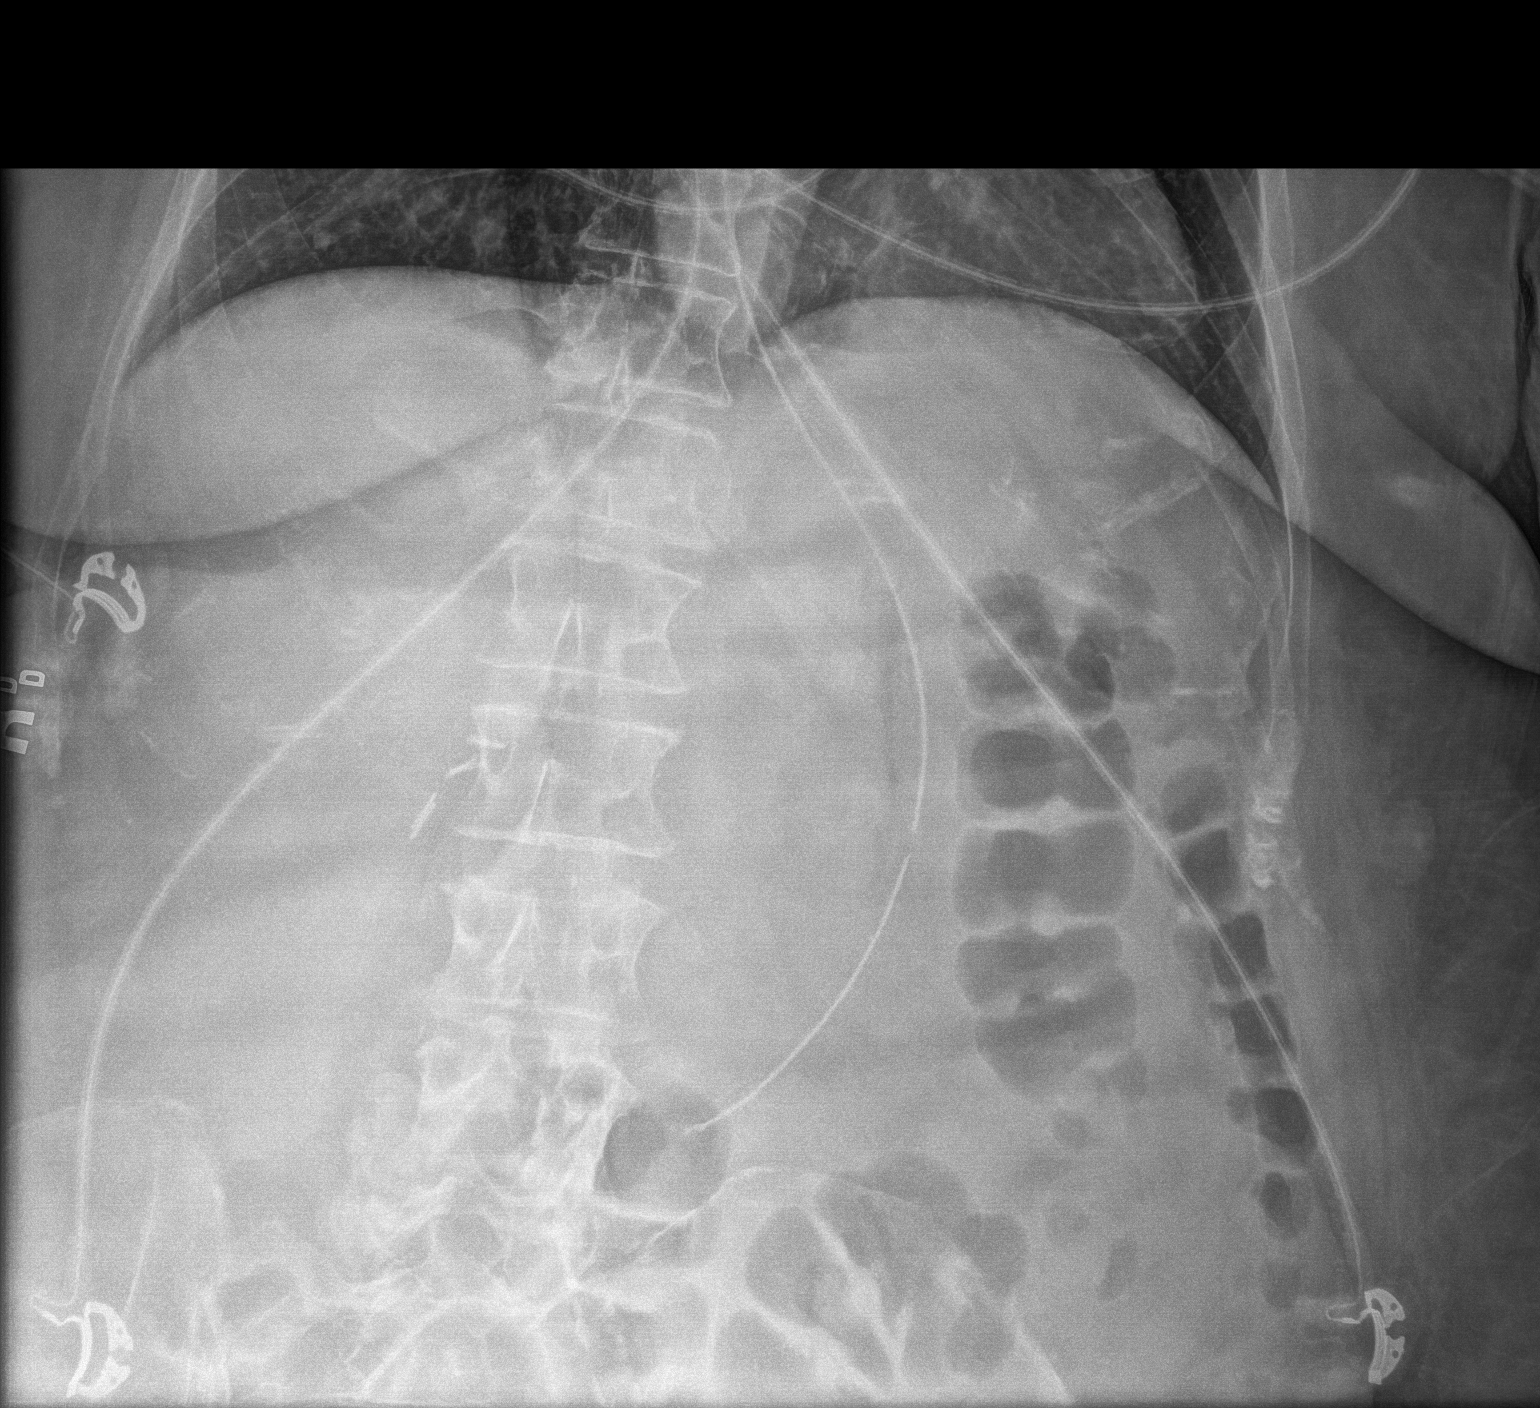

[abdomen supine (2 of 2)]
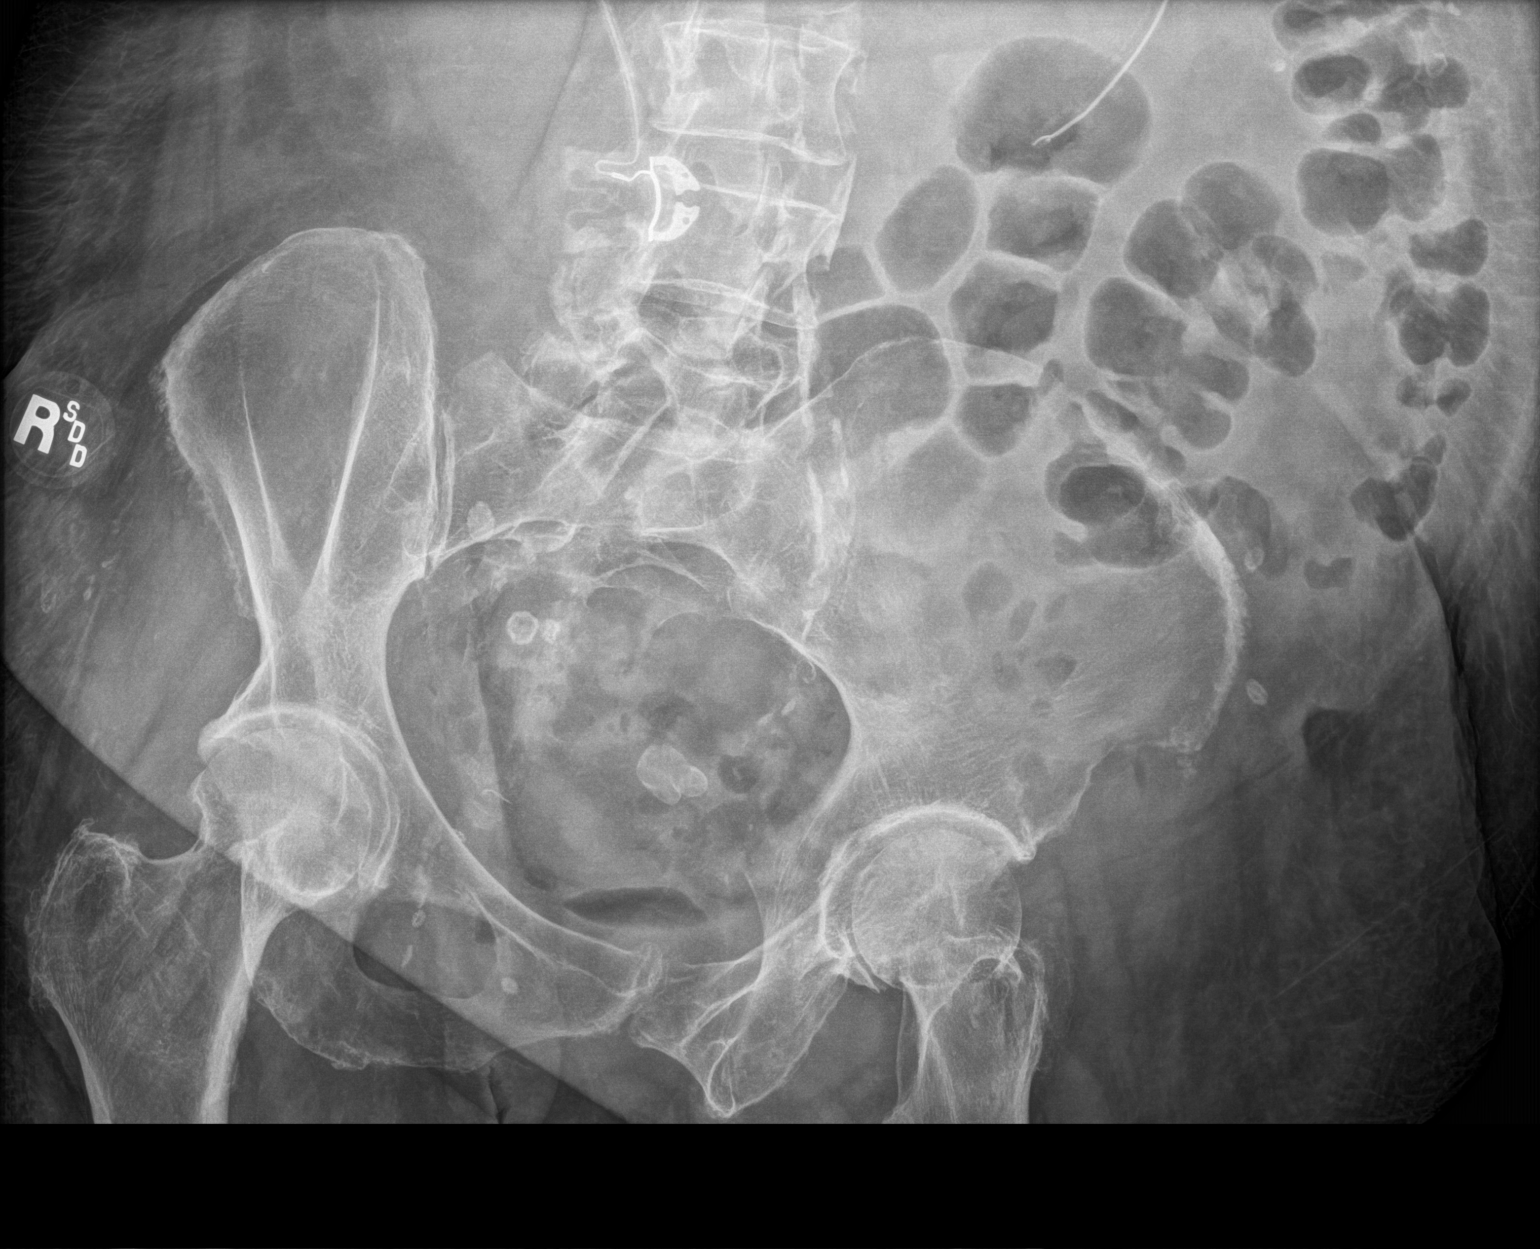

[2 of 2 positions shown; findings below may reference images not displayed]

FINDINGS: The bowel gas pattern is normal. Endotracheal tube is identified
with distal tip in the mid stomach. Prior cholecystectomy clips are
identified. Degenerative joint changes of bilateral hips and
lumbosacral identified.
IMPRESSION: No bowel obstruction. Endotracheal tube is identified with distal
tip in the mid stomach.

## 2022-03-03 MED ORDER — POTASSIUM CHLORIDE 20 MEQ PO PACK
40.0000 meq | PACK | Freq: Two times a day (BID) | ORAL | Status: DC
  Administered 2022-03-03 – 2022-03-04 (×3): 40 meq
  Filled 2022-03-03 (×3): qty 2

## 2022-03-03 MED ORDER — FUROSEMIDE 10 MG/ML IJ SOLN
20.0000 mg | Freq: Once | INTRAMUSCULAR | Status: AC
  Administered 2022-03-03: 20 mg via INTRAVENOUS
  Filled 2022-03-03: qty 2

## 2022-03-03 MED ORDER — POTASSIUM CHLORIDE 20 MEQ PO PACK
40.0000 meq | PACK | Freq: Once | ORAL | Status: AC
  Administered 2022-03-03: 40 meq
  Filled 2022-03-03: qty 2

## 2022-03-03 MED ORDER — POTASSIUM CHLORIDE 10 MEQ/50ML IV SOLN
10.0000 meq | INTRAVENOUS | Status: AC
  Administered 2022-03-03 (×4): 10 meq via INTRAVENOUS
  Filled 2022-03-03 (×4): qty 50

## 2022-03-03 MED ORDER — MAGNESIUM SULFATE 2 GM/50ML IV SOLN
2.0000 g | Freq: Once | INTRAVENOUS | Status: AC
  Administered 2022-03-03: 2 g via INTRAVENOUS
  Filled 2022-03-03: qty 50

## 2022-03-03 MED ORDER — DEXTROSE 50 % IV SOLN
12.5000 g | Freq: Once | INTRAVENOUS | Status: AC
  Administered 2022-03-03: 12.5 g via INTRAVENOUS
  Filled 2022-03-03: qty 50

## 2022-03-03 MED ORDER — LABETALOL HCL 5 MG/ML IV SOLN
10.0000 mg | INTRAVENOUS | Status: DC | PRN
  Administered 2022-03-03 (×3): 10 mg via INTRAVENOUS
  Filled 2022-03-03 (×2): qty 4

## 2022-03-03 MED ORDER — METOCLOPRAMIDE HCL 5 MG/ML IJ SOLN
5.0000 mg | Freq: Three times a day (TID) | INTRAMUSCULAR | Status: DC
  Administered 2022-03-03 – 2022-03-15 (×34): 5 mg via INTRAVENOUS
  Filled 2022-03-03 (×36): qty 2

## 2022-03-03 MED ORDER — LABETALOL HCL 5 MG/ML IV SOLN
20.0000 mg | INTRAVENOUS | Status: DC | PRN
  Administered 2022-03-03 – 2022-03-04 (×4): 20 mg via INTRAVENOUS
  Filled 2022-03-03 (×4): qty 4

## 2022-03-03 MED ORDER — DEXMEDETOMIDINE HCL IN NACL 400 MCG/100ML IV SOLN
0.4000 ug/kg/h | INTRAVENOUS | Status: DC
  Administered 2022-03-03: 0.4 ug/kg/h via INTRAVENOUS
  Administered 2022-03-03 – 2022-03-04 (×2): 1.2 ug/kg/h via INTRAVENOUS
  Filled 2022-03-03 (×2): qty 100

## 2022-03-03 MED ORDER — DEXAMETHASONE SODIUM PHOSPHATE 4 MG/ML IJ SOLN
4.0000 mg | Freq: Two times a day (BID) | INTRAMUSCULAR | Status: AC
  Administered 2022-03-03 – 2022-03-04 (×3): 4 mg via INTRAVENOUS
  Filled 2022-03-03 (×3): qty 1

## 2022-03-03 MED ORDER — DEXTROSE 10 % IV SOLN: INTRAVENOUS | Status: DC

## 2022-03-03 MED ORDER — DEXMEDETOMIDINE HCL IN NACL 400 MCG/100ML IV SOLN
INTRAVENOUS | Status: AC
  Filled 2022-03-03: qty 100

## 2022-03-03 NOTE — Progress Notes (Signed)
Patients HR up to 120's and coughing over the vent. PRN ativan given. Continue to monitor. ?

## 2022-03-03 NOTE — Progress Notes (Signed)
Hypoglycemic event- Patient received scheduled dose of long acting insulin this shift. CBG down to 66, 12.5g D50 given per standing order. Domingo Pulse, NP notified and new order entered for D10 @10  ml/hr, continue to monitor.  ?

## 2022-03-03 NOTE — Progress Notes (Signed)
Subjective: ?Patient started bleeding from left CEA wound site late last night. Plavix and ASA were held.   ? ?Objective: ?Current vital signs: ?BP (!) 153/60 (BP Location: Right Arm)   Pulse 67   Temp 97.7 ?F (36.5 ?C) (Axillary)   Resp (!) 22   Ht 5\' 2"  (1.575 m)   Wt 84.8 kg   SpO2 96%   BMI 34.19 kg/m?  ?Vital signs in last 24 hours: ?Temp:  [97.6 ?F (36.4 ?C)-100.2 ?F (37.9 ?C)] 97.7 ?F (36.5 ?C) (04/23 05-29-1979) ?Pulse Rate:  [65-125] 67 (04/23 0800) ?Resp:  [0-35] 22 (04/23 0800) ?BP: (143-174)/(54-93) 153/60 (04/23 0800) ?SpO2:  [89 %-100 %] 96 % (04/23 0800) ?FiO2 (%):  [28 %] 28 % (04/23 0400) ?Weight:  [84.8 kg] 84.8 kg (04/23 0500) ? ?Intake/Output from previous day: ?04/22 0701 - 04/23 0700 ?In: 1715.4 [I.V.:138.2; NG/GT:1128.2; IV Piggyback:449] ?Out: 2550 [Urine:2350; Emesis/NG output:200] ?Intake/Output this shift: ?Total I/O ?In: 36.9 [I.V.:10.2; IV Piggyback:26.7] ?Out: 325 [Urine:325] ?Nutritional status:  ?Diet Order   ? ?       ?  Diet NPO time specified  Diet effective now       ?  ? ?  ?  ? ?  ? ? ?Physical Exam  ?HEENT-  [place]/AT. Left CEA operative site with dressing in place.  ?Lungs- Intubated ?Extremities- Diffuse edema ?  ?Neurological Examination ?Mental Status: Intubated, off all sedation. Lethargic. Does open eyes to voice and tactile stimulation. Follows some commands.  ?Cranial Nerves: ?II: Pupils are equal. Will briefly fixate on visual stimuli. Blinks to threat in temporal visual fields bilaterally.  ?III,IV, VI: Eyes are conjugate near the midline. EOMI with some hesitancy on gazing to the left.   ?VII:  Unable to assess definitively due to intubation ?VIII: Follows some verbal commands.  ?IX,X: Intubated ?XI: Head is midline.  ?XII: Intubated ?Motor/Sensory: ?LUE: Decreased tone. No movement spontaneously. Will elevate with difficulty about 6 inches off the bed. Weak 3/5 grip strength.  ?RUE 4/5 proximally and distally ?LLE 4/5 ?RLE 4/5 ?Weak withdrawal of BLE to noxious plantar  stimulation bilaterally, less briskly on the left.  ?Cerebellar/Gait: Unable to assess ? ?Lab Results: ?Results for orders placed or performed during the hospital encounter of 02/24/22 (from the past 48 hour(s))  ?Glucose, capillary     Status: Abnormal  ? Collection Time: 03/01/22 11:07 AM  ?Result Value Ref Range  ? Glucose-Capillary 175 (H) 70 - 99 mg/dL  ?  Comment: Glucose reference range applies only to samples taken after fasting for at least 8 hours.  ?Glucose, capillary     Status: Abnormal  ? Collection Time: 03/01/22  4:04 PM  ?Result Value Ref Range  ? Glucose-Capillary 187 (H) 70 - 99 mg/dL  ?  Comment: Glucose reference range applies only to samples taken after fasting for at least 8 hours.  ?Glucose, capillary     Status: Abnormal  ? Collection Time: 03/01/22  7:11 PM  ?Result Value Ref Range  ? Glucose-Capillary 196 (H) 70 - 99 mg/dL  ?  Comment: Glucose reference range applies only to samples taken after fasting for at least 8 hours.  ? Comment 1 Notify RN   ? Comment 2 Document in Chart   ?Glucose, capillary     Status: Abnormal  ? Collection Time: 03/01/22 10:52 PM  ?Result Value Ref Range  ? Glucose-Capillary 176 (H) 70 - 99 mg/dL  ?  Comment: Glucose reference range applies only to samples taken after fasting for at  least 8 hours.  ?Glucose, capillary     Status: Abnormal  ? Collection Time: 03/02/22  3:46 AM  ?Result Value Ref Range  ? Glucose-Capillary 157 (H) 70 - 99 mg/dL  ?  Comment: Glucose reference range applies only to samples taken after fasting for at least 8 hours.  ?CBC     Status: Abnormal  ? Collection Time: 03/02/22  4:29 AM  ?Result Value Ref Range  ? WBC 8.2 4.0 - 10.5 K/uL  ? RBC 3.19 (L) 3.87 - 5.11 MIL/uL  ? Hemoglobin 9.4 (L) 12.0 - 15.0 g/dL  ? HCT 27.9 (L) 36.0 - 46.0 %  ? MCV 87.5 80.0 - 100.0 fL  ? MCH 29.5 26.0 - 34.0 pg  ? MCHC 33.7 30.0 - 36.0 g/dL  ? RDW 12.9 11.5 - 15.5 %  ? Platelets 322 150 - 400 K/uL  ? nRBC 0.0 0.0 - 0.2 %  ?  Comment: Performed at [facility], [address] [place], [place] [address]  ?Basic metabolic panel     Status: Abnormal  ? Collection Time: 03/02/22  4:29 AM  ?Result Value Ref Range  ? Sodium 133 (L) 135 - 145 mmol/L  ? Potassium 3.6 3.5 - 5.1 mmol/L  ? Chloride 96 (L) 98 - 111 mmol/L  ? CO2 31 22 - 32 mmol/L  ? Glucose, Bld 177 (H) 70 - 99 mg/dL  ?  Comment: Glucose reference range applies only to samples taken after fasting for at least 8 hours.  ? BUN 15 8 - 23 mg/dL  ? Creatinine, Ser 0.38 (L) 0.44 - 1.00 mg/dL  ? Calcium 8.3 (L) 8.9 - 10.3 mg/dL  ? GFR, Estimated >60 >60 mL/min  ?  Comment: (NOTE) ?Calculated using the CKD-EPI Creatinine Equation (2021) ?  ? Anion gap 6 5 - 15  ?  Comment: Performed at [facility], [address], [place], [place] [address]  ?Magnesium     Status: None  ? Collection Time: 03/02/22  4:29 AM  ?Result Value Ref Range  ? Magnesium 1.8 1.7 - 2.4 mg/dL  ?  Comment: Performed at [facility], [address], [place], [place] [address]  ?Phosphorus     Status: None  ? Collection Time: 03/02/22  4:29 AM  ?Result Value Ref Range  ? Phosphorus 3.8 2.5 - 4.6 mg/dL  ?  Comment: Performed at [facility], [address], [place], [place] [address]  ?Glucose, capillary     Status: Abnormal  ? Collection Time: 03/02/22  7:37 AM  ?Result Value Ref Range  ? Glucose-Capillary 149 (H) 70 - 99 mg/dL  ?  Comment: Glucose reference range applies only to samples taken after fasting for at least 8 hours.  ?Culture, Respiratory w Gram Stain     Status: None (Preliminary result)  ? Collection Time: 03/02/22  9:41 AM  ? Specimen: Tracheal Aspirate; Respiratory  ?Result Value Ref Range  ? Specimen Description    ?  TRACHEAL ASPIRATE ?Performed at [facility], [address], [place], [place] [address] ?  ? Special Requests    ?  NONE ?Performed at [facility], [address] [place], [place] [address] ?  ? Gram Stain    ?  NO SQUAMOUS EPITHELIAL CELLS SEEN ?FEW WBC  SEEN ?RARE GRAM NEGATIVE RODS ?Performed at [facility], [address], [place], [place] [address] ?  ? Culture PENDING   ? Report Status PENDING   ?Glucose,  capillary     Status: Abnormal  ? Collection Time: 03/02/22 12:13 PM  ?Result Value Ref Range  ? Glucose-Capillary 154 (H) 70 - 99 mg/dL  ?  Comment: Glucose reference range applies only to samples taken after fasting for at least 8 hours.  ?Glucose, capillary     Status: Abnormal  ? Collection Time: 03/02/22  3:58 PM  ?Result Value Ref Range  ? Glucose-Capillary 150 (H) 70 - 99 mg/dL  ?  Comment: Glucose reference range applies only to samples taken after fasting for at least 8 hours.  ?Glucose, capillary     Status: Abnormal  ? Collection Time: 03/02/22  7:32 PM  ?Result Value Ref Range  ? Glucose-Capillary 127 (H) 70 - 99 mg/dL  ?  Comment: Glucose reference range applies only to samples taken after fasting for at least 8 hours.  ?Glucose, capillary     Status: Abnormal  ? Collection Time: 03/02/22  8:47 PM  ?Result Value Ref Range  ? Glucose-Capillary 147 (H) 70 - 99 mg/dL  ?  Comment: Glucose reference range applies only to samples taken after fasting for at least 8 hours.  ?Glucose, capillary     Status: None  ? Collection Time: 03/02/22 11:21 PM  ?Result Value Ref Range  ? Glucose-Capillary 93 70 - 99 mg/dL  ?  Comment: Glucose reference range applies only to samples taken after fasting for at least 8 hours.  ?Glucose, capillary     Status: Abnormal  ? Collection Time: 03/03/22  3:35 AM  ?Result Value Ref Range  ? Glucose-Capillary 66 (L) 70 - 99 mg/dL  ?  Comment: Glucose reference range applies only to samples taken after fasting for at least 8 hours.  ?Glucose, capillary     Status: Abnormal  ? Collection Time: 03/03/22  4:07 AM  ?Result Value Ref Range  ? Glucose-Capillary 131 (H) 70 - 99 mg/dL  ?  Comment: Glucose reference range applies only to samples taken after fasting for at least 8 hours.  ?Basic metabolic panel     Status: Abnormal  ?  Collection Time: 03/03/22  4:14 AM  ?Result Value Ref Range  ? Sodium 133 (L) 135 - 145 mmol/L  ? Potassium 2.7 (LL) 3.5 - 5.1 mmol/L  ?  Comment: CRITICAL RESULT CALLED TO, READ BACK BY AND VERIFIED WITH ?MELIS

## 2022-03-03 NOTE — Progress Notes (Signed)
? ?NAME:  Veronica Murray, MRN:  [id number], DOB:  [birth year], LOS: 6 ?ADMISSION DATE:  02/24/2022, INITIAL CONSULTATION DATE:  02/25/22 ?REFERRING MD:  Dr. Lorenso Courier, FOLLOW UP:  Carotid artery rupture  ? ?History of Present Illness:  ?66 yo F transferred emergently to [facility] ED from [facility] ED for emergent vascular intervention on a ruptured carotid endarterectomy wound. History obtained from Epic documentation, patient intubated and sedated.  ?Patient underwent LEFT endarterectomy on 01/24/22 at [facility] with Dr. Lucky Cowboy without complication. Follow up appointment on 02/01/22 revealed no complications. Patient visited an urgent care on 02/22/22 with concern for infected endarterectomy site, picture in epic shows redness, edema and drainage around healing surgical site. She was hemodynamically stable and was started on a 3 day course of Keflex, instructed to f/u with provider. ?On 02/24/22 patient brought in via EMS to [facility] ED with reports that surgical site burst open and began bleeding heavily. Per [facility] documentation patient was grey in color on arrival, unable to speak but one word at a time, EMS reporting hypotension with SBP in the 70's on arrival to [facility] ED. ?Patient was transferred to [facility] for emergent vascular intervention. ?ED course: ?At [facility] patient received 3 units of PRBC's & bolus of NS. In transport patient received fentanyl & zofran. Upon arrival at [facility] she was taken immediately to the OR for vascular intervention. ? ?Initial Vitals ([facility] ED): 97.3, 16, 81, 142/69 & 96% on RA ?Significant labs: (Labs/ Imaging personally reviewed) ?Chemistry: Na+:133, K+: 4.2, BUN/Cr.: 17/0.82, Serum CO2/ AG: 21/13, Alk Phos:146, albumin: 2.6, glucose: 444 ?Hematology: WBC: 7.4, Hgb: 9.2 > 8.8, plt: 380  ?Lactic: >9.0 ?COVID-19 & Influenza A/B: pending ?ABG: 7.38/ 42/ 258/ 24.8 ? ?OR: ?Patient transferred ED to ED then emergently to the OR for left neck exploration due to LEFT carotid artery blow out with active bleed s/p LEFT CEA. She  received 2 additional units of PRBC's, intubated and placed on mechanical ventilatory support. Patient underwent LEFT neck exploration, hemostasis, partial cormatrix excision, primary suture repair of common and internal carotid artery with reconstruction. EBL 350. ? ?PCCM consulted for assistance and further management as patient is intubated requiring mechanical ventilatory support due to LEFT carotid artery rupture. Plan for patient to return to the OR later today on 02/25/22 ? ?Pertinent  Medical History  ?T2DM ?HTN ?Fibromyalgia ?Aortic arch atherosclerosis ?Tobacco abuse ?CAD ?Significant Hospital Events: ?Including procedures, antibiotic start and stop dates in addition to other pertinent events   ?02/25/22: Admit to ICU s/p left carotid artery repair in the setting of endarterectomy carotid rupture due to left carotid patch infection remained mechanically intubated postop ?02/28/22: Pt underwent left carotid artery stent placement  ?02/28/22: Will perform SBT with plans for possible extubation.  Pt unable to follow commands during WUA CT Head revealed subacute right MCA stroke as well as recent but older-appearing left MCA strokes x2.  Neurology consulted.  Unable to extubate  ? ?Interim History / Subjective:  ?Increased serosanguineous drainage from the left CEA wound.  Patient does have hematoma on that side suspect this is draining hematoma fluid.  She has been tolerating SBT.  More responsive.  She however does not have an ETT leak therefore will not be able to extubate today. ? ?Dr. Trula Slade reinforce dressing on left CEA with some Surgicel as well. ? ?Objective   ?Blood pressure (!) 153/60, pulse 67, temperature 97.7 ?F (36.5 ?C), temperature source Axillary, resp. rate (!) 22, height _0  (1.575 m), weight 84.8 kg, SpO2  96 %. ?   ?Vent Mode: PRVC ?FiO2 (%):  [28 %] 28 % ?Set Rate:  [12 bmp] 12 bmp ?Vt Set:  [400 mL] 400 mL ?PEEP:  [5 cmH20] 5 cmH20 ?Pressure Support:  [5 cmH20] 5 cmH20 ?Plateau Pressure:   [10 cmH20] 10 cmH20  ? ?Currently on SBT 5/8 ? ?Intake/Output Summary (Last 24 hours) at 03/03/2022 0833 ?Last data filed at 03/03/2022 0800 ?Gross per 24 hour  ?Intake 1742.17 ml  ?Output 2875 ml  ?Net -1132.83 ml  ? ? ?Filed Weights  ? 03/01/22 0446 03/02/22 0359 03/03/22 0500  ?Weight: 90.2 kg 89 kg 84.8 kg  ? ? ?Examination: ?General: Acutely ill appearing female, NAD mechanically ventilated, currently on SBT mode. ?HEENT: Left CEA incision ?Neuro: Off of sedation, gag reflex present, opens eyes to voice, PERRL decreased strength LUE no spontaneous movement.  Minimal grip.  Other extremities stronger. ?CV: NSR, rrr, no R/G, 2+ radial/2+ distal pulses, no edema  ?Pulm: Clear throughout, even, non labored  ?GI: +BS x4, obese, soft, non distended  ?GU: Indwelling foley draining clear yellow urine ?Skin: Left neck incision site dressing dry and intact  ?Extremities: Normal bulk and tone  ? ?Resolved Hospital Problem list   ?Circulatory Shock ?Mild transaminitis  ? ?Assessment & Plan:  ?Severe carotid atherosclerotic disease with  left amaurasis fugax requiring endarterectomy and CorMatrix patch on 01/25/22 ?Left carotid patch infection/bleeding~blood cultures positive for Streptococcus constellatus with patch removal and repair 04/17 and left stent placement 04/20 ?- Trend WBC and monitor fever curve ?- Trend PCT ?- Follow cultures ?- ID consulted appreciate input~endovascular infection should be treated at least 6 weeks with IV abx followed by po.  For now will continue unasyn and at discharge change to ceftriaxone  ?- Vascular surgery following, discussed with Dr. Trula Slade today.  He redressed left CEA wound ?- Aspirin and plavix held however after evaluation of the wound more closely this is likely draining hematoma can resume Plavix and aspirin in the morning ? ?Post Op Ventilator Management ?PMHx: Tobacco Abuse ?- Full vent support for now: vent settings reviewed and established  ?- Wean PEEP & FiO2 as tolerated,  maintain SpO2 > 90% ?- Head of bed elevated 30 degrees, VAP protocol in place ?- Plateau pressures less than 30 cm H20  ?- Intermittent chest x-ray & ABG PRN ?- Daily WUA with SBT as tolerated; new acute right MCA stroke revealed on CT Head 04/22 precluding extubation at this time  ?- Patient has no leak on ETT, cannot be extubated at this time will give for very short course Decadron ?- Ensure adequate pulmonary hygiene  ?- Off of continuous infusion sedatives receiving only PRN's ?- Once extubated pt will need smoking cessation counseling  ? ?HTN ?Volume overload ?- Continuous telemetry monitoring ?- PRN labetalol for bp management  ?- Lasix x1 ? ?Mild hyponatremia ?Hypokalemia -replacing ?- Trend BMP  ?- Replace electrolytes as indicated  ?- Monitor UOP  ? ?Poorly controlled Type 2 Diabetes Mellitus ?- CBG's q4hrs  ?- SSI, scheduled novolog, and levemir  ? ?Acute encephalopathy~MRI brain 4/22 multiple smaller acute/early subacute cortical and subcortical infarcts within the left frontoparietal lobes, posterior left temporal lobe and lateral left occipital lobe (left MCA vascular territory, as well as left MCA/ACA and left MCA/PCA watershed territories). Petechial hemorrhage associated with some of these ?infarcts. These infarcts are superimposed upon chronic infarcts within the left MCA vascular territory and left watershed territory ?- Avoid sedating medication  ?- Allow for permissive hypertension ?-  Echo pending  ?- Continue aspirin and plavix per vascular surgery recommendations (resume 4/24); subq lovenox discontinued due to risk of hemorrhagic transformation of stroke  ?- Neurology consulted appreciate input  ? ?Nutrition ?- On tube feeds ?- Developed vomiting last night ?- Query diabetic gastroparesis ?- Reglan 5 mg IV every 8 ?  ?Best Practice (right click and "Reselect all SmartList Selections" daily)  ?Diet/type: Tube Feeds, being held due to vomiting ?DVT prophylaxis: SCD  ?GI prophylaxis: PPI ?Lines:  Left brachial PICC line and still needed, Left radial a-line discontinued 04/22 ?Foley:  Yes, and it is still needed ?Code Status:  full code ?Last date of multidisciplinary goals of care discussion [04/2

## 2022-03-03 NOTE — Progress Notes (Signed)
? ? ?  Subjective  -  ? ?Had some bleeding from her carotid incision last night.  Manual pressure was held.  She again had some oozing this morning.  She remains intubated.  She is a little more alert ? ? ?Physical Exam: ? ?Can move all 4 extremities.  She did squeeze my hand to command as well as move her toes. ? ?She has a mild hematoma in the left neck which is not expanding.  She is having some oozing through her incision which I suspect is old hematoma. ? ? ? ? ? ? ?Assessment/Plan:   ? ?Carotid: Plavix was held yesterday because of the bleeding.  This needs to be restarted as soon as possible given her carotid stenting.  I suspect that the bleeding is more just oozing from old hematoma.  This does not appear to be expanding.  Surgicel was placed over top of the incision as well as a light compression dressing.  Family was updated at the bedside ? ?Annamarie Major ?03/03/2022 ?4:51 PM ?-- ? ?Vitals:  ? 03/03/22 1500 03/03/22 1511  ?BP: (!) 145/69   ?Pulse: 66   ?Resp: 16   ?Temp:  97.6 ?F (36.4 ?C)  ?SpO2: 100%   ? ? ?Intake/Output Summary (Last 24 hours) at 03/03/2022 1651 ?Last data filed at 03/03/2022 1600 ?Gross per 24 hour  ?Intake 1114.39 ml  ?Output 3400 ml  ?Net -2285.61 ml  ? ? ? ?Laboratory ?CBC ?   ?Component Value Date/Time  ? WBC 12.0 (H) 03/03/2022 0414  ? HGB 10.2 (L) 03/03/2022 0414  ? HCT 29.7 (L) 03/03/2022 0414  ? PLT 431 (H) 03/03/2022 0414  ? ? ?BMET ?   ?Component Value Date/Time  ? NA 130 (L) 03/03/2022 1348  ? K 3.1 (L) 03/03/2022 1348  ? CL 93 (L) 03/03/2022 1348  ? CO2 32 03/03/2022 1348  ? GLUCOSE 81 03/03/2022 1348  ? BUN 10 03/03/2022 1348  ? CREATININE 0.39 (L) 03/03/2022 1348  ? CALCIUM 8.3 (L) 03/03/2022 1348  ? GFRNONAA >60 03/03/2022 1348  ? GFRAA >60 06/26/2019 0434  ? ? ?COAG ?Lab Results  ?Component Value Date  ? INR 1.1 02/25/2022  ? INR 1.0 02/24/2022  ? INR 0.9 06/25/2019  ? ?No results found for: PTT ? ?Antibiotics ?Anti-infectives (From admission, onward)  ? ? Start      Dose/Rate Route Frequency Ordered Stop  ? 02/28/22 1145  Ampicillin-Sulbactam (UNASYN) 3 g in sodium chloride 0.9 % 100 mL IVPB       ? 3 g ?200 mL/hr over 30 Minutes Intravenous Every 6 hours 02/28/22 1053    ? 02/26/22 1400  metroNIDAZOLE (FLAGYL) IVPB 500 mg  Status:  Discontinued       ? 500 mg ?100 mL/hr over 60 Minutes Intravenous Every 8 hours 02/26/22 1059 02/28/22 1053  ? 02/26/22 0600  vancomycin (VANCOCIN) IVPB 1000 mg/200 mL premix  Status:  Discontinued       ? 1,000 mg ?200 mL/hr over 60 Minutes Intravenous Every 24 hours 02/25/22 0443 02/28/22 1053  ? 02/25/22 0530  vancomycin (VANCOREADY) IVPB 1750 mg/350 mL       ? 1,750 mg ?175 mL/hr over 120 Minutes Intravenous  Once 02/25/22 0441 02/25/22 0801  ? ?  ? ? ? ?V. Leia Alf, M.D., FACS ?[facility] ?Office: [phone] ?Pager:  [phone]  ?

## 2022-03-04 ENCOUNTER — Other Ambulatory Visit: Payer: Self-pay

## 2022-03-04 DIAGNOSIS — I639 Cerebral infarction, unspecified: Secondary | ICD-10-CM | POA: Diagnosis not present

## 2022-03-04 DIAGNOSIS — T827XXA Infection and inflammatory reaction due to other cardiac and vascular devices, implants and grafts, initial encounter: Secondary | ICD-10-CM | POA: Diagnosis not present

## 2022-03-04 DIAGNOSIS — I6389 Other cerebral infarction: Secondary | ICD-10-CM | POA: Diagnosis not present

## 2022-03-04 DIAGNOSIS — I772 Rupture of artery: Secondary | ICD-10-CM | POA: Diagnosis not present

## 2022-03-04 DIAGNOSIS — Z9889 Other specified postprocedural states: Secondary | ICD-10-CM | POA: Diagnosis not present

## 2022-03-04 LAB — BASIC METABOLIC PANEL
Anion gap: 7 (ref 5–15)
BUN: 14 mg/dL (ref 8–23)
CO2: 30 mmol/L (ref 22–32)
Calcium: 8.5 mg/dL — ABNORMAL LOW (ref 8.9–10.3)
Chloride: 96 mmol/L — ABNORMAL LOW (ref 98–111)
Creatinine, Ser: 0.34 mg/dL — ABNORMAL LOW (ref 0.44–1.00)
GFR, Estimated: >60 mL/min (ref >60)
Glucose, Bld: 175 mg/dL — ABNORMAL HIGH (ref 70–99)
Potassium: 3.9 mmol/L (ref 3.5–5.1)
Sodium: 133 mmol/L — ABNORMAL LOW (ref 135–145)

## 2022-03-04 LAB — GLUCOSE, CAPILLARY
Glucose-Capillary: 164 mg/dL — ABNORMAL HIGH (ref 70–99)
Glucose-Capillary: 152 mg/dL — ABNORMAL HIGH (ref 70–99)
Glucose-Capillary: 109 mg/dL — ABNORMAL HIGH (ref 70–99)
Glucose-Capillary: 138 mg/dL — ABNORMAL HIGH (ref 70–99)
Glucose-Capillary: 113 mg/dL — ABNORMAL HIGH (ref 70–99)
Glucose-Capillary: 135 mg/dL — ABNORMAL HIGH (ref 70–99)

## 2022-03-04 LAB — CBC
HCT: 27.4 % — ABNORMAL LOW (ref 36.0–46.0)
Hemoglobin: 9.5 g/dL — ABNORMAL LOW (ref 12.0–15.0)
MCH: 30.2 pg (ref 26.0–34.0)
MCHC: 34.7 g/dL (ref 30.0–36.0)
MCV: 87 fL (ref 80.0–100.0)
Platelets: 418 10*3/uL — ABNORMAL HIGH (ref 150–400)
RBC: 3.15 MIL/uL — ABNORMAL LOW (ref 3.87–5.11)
RDW: 12.4 % (ref 11.5–15.5)
WBC: 9.5 10*3/uL (ref 4.0–10.5)
nRBC: 0 % (ref 0.0–0.2)

## 2022-03-04 LAB — MAGNESIUM: Magnesium: 2.2 mg/dL (ref 1.7–2.4)

## 2022-03-04 LAB — PHOSPHORUS: Phosphorus: 4.3 mg/dL (ref 2.5–4.6)

## 2022-03-04 MED ORDER — ORAL CARE MOUTH RINSE
15.0000 mL | Freq: Two times a day (BID) | OROMUCOSAL | Status: DC
  Administered 2022-03-04 – 2022-03-15 (×16): 15 mL via OROMUCOSAL

## 2022-03-04 MED ORDER — OXYCODONE-ACETAMINOPHEN 5-325 MG PO TABS
1.0000 | ORAL_TABLET | Freq: Four times a day (QID) | ORAL | Status: DC | PRN
  Administered 2022-03-09: 1
  Filled 2022-03-04 (×2): qty 1

## 2022-03-04 MED ORDER — HYDRALAZINE HCL 20 MG/ML IJ SOLN
10.0000 mg | Freq: Four times a day (QID) | INTRAMUSCULAR | Status: DC | PRN
  Administered 2022-03-04: 10 mg via INTRAVENOUS
  Administered 2022-03-06 – 2022-03-07 (×2): 20 mg via INTRAVENOUS
  Filled 2022-03-04 (×3): qty 1

## 2022-03-04 NOTE — Progress Notes (Signed)
Patients husband, Coralyn Mark, telephoned to request that Vickki Hearing (patient child) not be able to visit the patient. He reports that he does not feel that she has the same expectations as he and other family members and that she may not help the patient remain calm while vented or with attempts to wean off vent.  Informed husband that I would notify ICU charge RN of request and verified password. Secretary already aware, Coralyn Mark has already requested that he be called before anyone allowed to visit patient.  ?

## 2022-03-04 NOTE — Progress Notes (Signed)
? ?  Date of Admission:  02/24/2022    ?ID: Veronica Murray is a 66 y.o. female  ?Principal Problem: ?  Post-operative state ?Active Problems: ?  Rupture of carotid artery (HCC) ? ? ? ?Subjective: ?Extubated this morning ?Awake ?Answers questions ? ?Medications:  ? aspirin  81 mg Per Tube Daily  ? Chlorhexidine Gluconate Cloth  6 each Topical Daily  ? clopidogrel  75 mg Per Tube Daily  ? docusate  100 mg Per Tube BID  ? insulin aspart  0-20 Units Subcutaneous Q4H  ? insulin aspart  3 Units Subcutaneous Q4H  ? metoCLOPramide (REGLAN) injection  5 mg Intravenous Q8H  ? pantoprazole sodium  40 mg Per Tube Daily  ? polyethylene glycol  17 g Per Tube Daily  ? potassium chloride  40 mEq Per Tube BID  ? sodium chloride flush  10-40 mL Intracatheter Q12H  ? sodium chloride flush  3 mL Intravenous Q12H  ? ? ?Objective: ?Vital signs in last 24 hours: ?Temp:  [97.6 ?F (36.4 ?C)-99 ?F (37.2 ?C)] 97.8 ?F (36.6 ?C) (04/24 1330) ?Pulse Rate:  [60-93] 87 (04/24 1329) ?Resp:  [11-30] 30 (04/24 1329) ?BP: (122-169)/(55-80) 158/69 (04/24 1235) ?SpO2:  [85 %-100 %] 89 % (04/24 1329) ?FiO2 (%):  [28 %] 28 % (04/24 0800) ?Weight:  [82.9 kg] 82.9 kg (04/24 0458) ? ?PHYSICAL EXAM:  ?General: extubated- awake ?Oriented in person and place and answers many questions, follows commands ? ?Eyes deviated to right  ?Left side of  neck dressing ? ?Lungs:b/la ir entry. ?Heart: s1s2. ?Abdomen: Soft, non-tender,not distended. Bowel sounds normal. No masses ?Extremities: left PICc ?Skin: No rashes or lesions. Or bruising ?Lymph: Cervical, supraclavicular normal. ?Neurologic: moves limbs ? ?Lab Results ?Recent Labs  ?  03/03/22 ?0414 03/03/22 ?1348 03/03/22 ?2040 03/04/22 ?9702  ?WBC 12.0*  --   --  9.5  ?HGB 10.2*  --   --  9.5*  ?HCT 29.7*  --   --  27.4*  ?NA 133* 130*  --  133*  ?K 2.7* 3.1* 3.5 3.9  ?CL 93* 93*  --  96*  ?CO2 34* 32  --  30  ?BUN 12 10  --  14  ?CREATININE 0.35* 0.39*  --  0.34*  ? ?Microbiology: ?Wound culture strep  constellatus ?Blood culture NG ? ? 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  ?Infected graft with blow out and hemorrhage ?Patch removal and repair on 4/17 ?Stent placement on 4/20 ?  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 ?Acute RT MCA infarct ?  ?Anemia has received PRBC multiple units ?  ?DM on insulin ?  ?Extubated  ? ?Discussed the management with her nurse ?  ? ?

## 2022-03-04 NOTE — Progress Notes (Signed)
? ?NAME:  Veronica Murray, MRN:  [id number], DOB:  Dec 08, 1955, LOS: 7 ?ADMISSION DATE:  02/24/2022, INITIAL CONSULTATION DATE:  02/25/22 ?REFERRING MD:  Dr. Lorenso Courier, FOLLOW UP:  Carotid artery rupture  ? ?History of Present Illness:  ?66 yo F transferred emergently to [facility] ED from [facility] ED for emergent vascular intervention on a ruptured carotid endarterectomy wound. History obtained from Epic documentation, patient intubated and sedated.  ?Patient underwent LEFT endarterectomy on 01/24/22 at [facility] with Dr. Lucky Cowboy without complication. Follow up appointment on 02/01/22 revealed no complications. Patient visited an urgent care on 02/22/22 with concern for infected endarterectomy site, picture in epic shows redness, edema and drainage around healing surgical site. She was hemodynamically stable and was started on a 3 day course of Keflex, instructed to f/u with provider. ?On 02/24/22 patient brought in via EMS to [facility] ED with reports that surgical site burst open and began bleeding heavily. Per [facility] documentation patient was grey in color on arrival, unable to speak but one word at a time, EMS reporting hypotension with SBP in the 70's on arrival to [facility] ED. ?Patient was transferred to [facility] for emergent vascular intervention. ?ED course: ?At [facility] patient received 3 units of PRBC's & bolus of NS. In transport patient received fentanyl & zofran. Upon arrival at [facility] she was taken immediately to the OR for vascular intervention. ? ?Initial Vitals ([facility] ED): 97.3, 16, 81, 142/69 & 96% on RA ?Significant labs: (Labs/ Imaging personally reviewed) ?Chemistry: Na+:133, K+: 4.2, BUN/Cr.: 17/0.82, Serum CO2/ AG: 21/13, Alk Phos:146, albumin: 2.6, glucose: 444 ?Hematology: WBC: 7.4, Hgb: 9.2 > 8.8, plt: 380  ?Lactic: >9.0 ?COVID-19 & Influenza A/B: pending ?ABG: 7.38/ 42/ 258/ 24.8 ? ?OR: ?Patient transferred ED to ED then emergently to the OR for left neck exploration due to LEFT carotid artery blow out with active bleed s/p LEFT CEA. She  received 2 additional units of PRBC's, intubated and placed on mechanical ventilatory support. Patient underwent LEFT neck exploration, hemostasis, partial cormatrix excision, primary suture repair of common and internal carotid artery with reconstruction. EBL 350. ? ?PCCM consulted for assistance and further management as patient is intubated requiring mechanical ventilatory support due to LEFT carotid artery rupture. Plan for patient to return to the OR later today on 02/25/22 ? ?Pertinent  Medical History  ?T2DM ?HTN ?Fibromyalgia ?Aortic arch atherosclerosis ?Tobacco abuse ?CAD ?Significant Hospital Events: ?Including procedures, antibiotic start and stop dates in addition to other pertinent events   ?02/25/22: Admit to ICU s/p left carotid artery repair in the setting of endarterectomy carotid rupture due to left carotid patch infection remained mechanically intubated postop ?02/28/22: Pt underwent left carotid artery stent placement  ?02/28/22: Will perform SBT with plans for possible extubation.  Pt unable to follow commands during [facility] CT Head revealed subacute right MCA stroke as well as recent but older-appearing left MCA strokes x2.  Neurology consulted.  Unable to extubate  ?4/23 MRI brain: Moderate-to-large acute/early subacute right MCA territory infarct within the cortical/subcortical right frontal lobe as well as right insula and subinsular region. Petechial hemorrhage at this site. Local mass effect without significant ventricular ?4/23 NEUROLOGY CONSULTED ? ?Interim History / Subjective:  ?Remains intubated ?Sedated ?Increased serosanguineous drainage from the left CEA wound.  Patient does have hematoma on that side suspect this is draining hematoma fluid.   ? ?Plan for extubation today ?Steroids started yesterday ?HOLD ANTIPLATELET AGENTS ? ?Objective   ?Blood pressure (!) 154/65, pulse 60, temperature 98.2 ?F (36.8 ?C), temperature  source Axillary, resp. rate 16, height $RemoveBe'5\' 2"'zWfDbCHLi$  (1.575 m), weight 82.9  kg, SpO2 99 %. ?   ?Vent Mode: PRVC ?FiO2 (%):  [28 %] 28 % ?Set Rate:  [12 bmp] 12 bmp ?Vt Set:  [400 mL] 400 mL ?PEEP:  [5 cmH20] 5 cmH20 ?Pressure Support:  [10 cmH20] 10 cmH20  ? ?Currently on SBT 5/8 ? ?Intake/Output Summary (Last 24 hours) at 03/04/2022 0743 ?Last data filed at 03/04/2022 0700 ?Gross per 24 hour  ?Intake 1311.32 ml  ?Output 2815 ml  ?Net -1503.68 ml  ? ? ?Filed Weights  ? 03/02/22 0359 03/03/22 0500 03/04/22 0458  ?Weight: 89 kg 84.8 kg 82.9 kg  ? ? ?REVIEW OF SYSTEMS ? ?PATIENT IS UNABLE TO PROVIDE COMPLETE REVIEW OF SYSTEMS DUE TO SEVERE CRITICAL ILLNESS AND TOXIC METABOLIC ENCEPHALOPATHY ? ? ? ?PHYSICAL EXAMINATION: ? ?GENERAL:critically ill appearing, +resp distress ?EYES: Pupils equal, round, reactive to light.  No scleral icterus.  ?MOUTH: Moist mucosal membrane. INTUBATED ?NECK: Supple.  Left CEA incision ?PULMONARY: +rhonchi, +wheezing ?CARDIOVASCULAR: S1 and S2.  No murmurs  ?GASTROINTESTINAL: Soft, nontender, -distended. Positive bowel sounds.  ?MUSCULOSKELETAL: No swelling, clubbing, or edema.  ?NEUROLOGIC: obtunded ?SKIN:intact,warm,dry ? ? ? ?Assessment & Plan:  ? ?66 yo WF Ruptured LEFT carotid at previous endarterectomy surgical site s/p repair ?With Circulatory Shock-hypovolumic and septic shock POA ?Lactic Acidosis in the setting of hemorrhagic/septic shock post op resp failure with tracheal deviation  ?  ?Severe ACUTE Hypoxic and Hypercapnic Respiratory Failure ?-continue Mechanical Ventilator support ?-Wean Fio2 and PEEP as tolerated ?-VAP/VENT bundle implementation ?- Wean PEEP & FiO2 as tolerated, maintain SpO2 > 88% ?- Head of bed elevated 30 degrees, VAP protocol in place ?- Plateau pressures less than 30 cm H20  ?- Intermittent chest x-ray & ABG PRN ?- Ensure adequate pulmonary hygiene  ?-will perform SAT/SBT when respiratory parameters are met ? ? 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  For now will continue unasyn and at discharge change to ceftriaxone  ?- Vascular surgery following, discussed with Dr. Trula Slade today.  He redressed left CEA wound ?- Aspirin and plavix held however after evaluation of the wound more closely this is likely draining hematoma can resume Plavix and aspirin in the morning ? ? ? ? ?Mild hyponatremia ?Hypokalemia -replacing ?- Trend BMP  ?- Replace electrolytes as indicated  ?- Monitor UOP  ? ? ?NEUROLOGY ?Acute encephalopathy~MRI brain 4/22 multiple smaller acute/early subacute cortical and subcortical infarcts within the left frontoparietal lobes, posterior left temporal lobe and lateral left occipital lobe (left MCA vascular territory, as well as left MCA/ACA and left MCA/PCA watershed territories). Petechial hemorrhage associated with some of these ?infarcts. These infarcts are superimposed upon chronic infarcts within the left MCA vascular territory and left watershed territory ?- Avoid sedating medication  ?- Allow for permissive hypertension ?- Echo pending  ?- Neurology consulted appreciate input  ? ? ?ENDO ?- ICU hypoglycemic\Hyperglycemia protocol ?-check FSBS per protocol ?Poorly controlled Type 2 Diabetes Mellitus ?- CBG's q4hrs  ?- SSI, scheduled novolog, and levemir  ? ? ?GI ?  GI PROPHYLAXIS as indicated ? ?NUTRITIONAL STATUS ?DIET-->TF's as tolerated ?Constipation protocol as indicated ? ? ?ELECTROLYTES ?-follow labs as needed ?-replace as needed ?-pharmacy consultation and following ? ?  ?Best Practice (right  click and "Reselect all SmartList Selections" daily)  ?Diet/type: Tube Feeds, being held due to vomiting ?DVT prophylaxis: SCD  ?GI prophylaxis: PPI ?Lines: Left brachial PICC line and still needed, Left radia

## 2022-03-04 NOTE — Evaluation (Signed)
Occupational Therapy Evaluation Patient Details Name: Veronica Murray MRN: [id number] DOB: Dec 06, 1955 Today's Date: 03/04/2022   History of Present Illness 66 yo F transferred emergently to [facility] ED for emergent vascular intervention on a ruptured carotid endarterectomy wound. Intubated on 4/16, extubated on 4/24. 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 Petechial hemorrhage at this site."   Clinical Impression   Pt seen for OT evaluation and co-tx with PT this date. No family present and limited PLOF/home set up obtained during session due to pt's cognition and hypophonia. Per chart, pt was living in a hotel alone prior to admission. Upon evaluation, pt reports living with her spouse in a mobile home. Currently pt demonstrates significant impairments in LUE/LLE strength, coordination, and questionable sensation. Pt demonstrates difficulty with voice projection and is difficult to understand. She is able to follow simple commands inconsistently. Appears to demonstrates L side weakness, increased difficulty with L side movement with commands and has improved movements spontaneously. Difficulty with L versus R. Appears to have L side inattention. When handed a washcloth with VC to wash her face, she attempts to reach behind her for pericare. Noted to have loose BM. MOD-MAX A +1-2 for log rolling repeatedly for pericare of loose BMs x2. Pt able to attempt pericare but due to amount of BM, significant assist required. L lateral lean corrected with assist and pillows to improve positioning. Coughing intermittently during session resulting in thick sputum. Pt would benefit from skilled OT to address noted impairments and functional limitations (see below for any additional details) in order to maximize safety and independence while minimizing falls risk and  caregiver burden.  Upon hospital discharge, recommend pt discharge to SNF to maximize safety and return to PLOF.    Recommendations for follow up therapy are one component of a multi-disciplinary discharge planning process, led by the attending physician.  Recommendations may be updated based on patient status, additional functional criteria and insurance authorization.   Follow Up Recommendations  Skilled nursing-short term rehab (<3 hours/day)    Assistance Recommended at Discharge Frequent or constant Supervision/Assistance  Patient can return home with the following Two people to help with bathing/dressing/bathroom;Two people to help with walking and/or transfers;Assistance with cooking/housework;Assist for transportation;Direct supervision/assist for medications management;Direct supervision/assist for financial management;Help with stairs or ramp for entrance    Functional Status Assessment  Patient has had a recent decline in their functional status and demonstrates the ability to make significant improvements in function in a reasonable and predictable amount of time.  Equipment Recommendations  Other (comment) (TBD at next venue)    Recommendations for Other Services       Precautions / Restrictions Precautions Precautions: Fall Restrictions Weight Bearing Restrictions: No      Mobility Bed Mobility Overal bed mobility: Needs Assistance Bed Mobility: Rolling Rolling: Max assist, Mod assist, +2 for physical assistance         General bed mobility comments: cues for sequencing, attempting to use bed rails for repeated rolling to assist with clean up of loose BMs    Transfers                   General transfer comment: unable to attempt 2/2 bouts of loose BMs      Balance Overall balance assessment: Needs assistance   Sitting balance-Leahy Scale: Poor Sitting balance - Comments: pt leaning slightly to L side requiring assist  to straighten up and pillows to  assist with positioning Postural control: Left lateral lean                                 ADL either performed or assessed with clinical judgement   ADL                                         General ADL Comments: Pt currently requires MIN-MOD A for grooming tasks, heavy reliance on RUE, MAX A for all other UB and LB ADL, pt able to initiate pericare in side lying position but requires MAX A to complete     Vision   Additional Comments: Pt appears to demonstrate L side inattention, cognition making it difficult to formally assess, does not blink to confrontation on L periphery, is able to visually track and scans to L side with cues     Perception     Praxis      Pertinent Vitals/Pain Pain Assessment Pain Assessment: Faces Faces Pain Scale: No hurt     Hand Dominance Right   Extremity/Trunk Assessment Upper Extremity Assessment Upper Extremity Assessment: Difficult to assess due to impaired cognition (appears to demonstrate L side weakness as compared to R, difficulty with confrontational movement but able to use LUE spontaneously much more easily)   Lower Extremity Assessment Lower Extremity Assessment: Difficult to assess due to impaired cognition (appears to demonstrate L side weakness as compared to R, difficulty with confrontational movement but able to use LLE spontaneously much more easily)       Communication Communication Communication: Other (comment) (hypophonic)   Cognition Arousal/Alertness: Awake/alert Behavior During Therapy: Flat affect Overall Cognitive Status: Impaired/Different from baseline Area of Impairment: Orientation, Attention, Memory, Following commands, Safety/judgement, Awareness, Problem solving                 Orientation Level: Person, Place Current Attention Level: Focused Memory: Decreased short-term memory Following Commands: Follows one step commands inconsistently Safety/Judgement: Decreased  awareness of deficits   Problem Solving: Slow processing, Decreased initiation, Difficulty sequencing, Requires verbal cues, Requires tactile cues       General Comments       Exercises     Shoulder Instructions      Home Living Family/patient expects to be discharged to:: Unsure Living Arrangements: Alone                               Additional Comments: Per chart/RN, pt was most recently living in hotel after separating from spouse. Pt reports living with her husband in a mobile home.      Prior Functioning/Environment Prior Level of Function : Patient poor historian/Family not available                        OT Problem List: Decreased strength;Decreased coordination;Decreased cognition;Decreased safety awareness;Impaired balance (sitting and/or standing);Decreased knowledge of use of DME or AE;Impaired UE functional use;Impaired vision/perception      OT Treatment/Interventions: Self-care/ADL training;Therapeutic exercise;Therapeutic activities;Neuromuscular education;Cognitive remediation/compensation;DME and/or AE instruction;Visual/perceptual remediation/compensation;Patient/family education;Balance training    OT Goals(Current goals can be found in the care plan section) Acute Rehab OT Goals Patient Stated Goal: pt unable to state OT Goal Formulation: Patient unable to participate in goal setting  Time For Goal Achievement: 03/18/22 Potential to Achieve Goals: Fair ADL Goals Pt Will Perform Grooming: sitting;with min assist (VC for sequencing as needed) Pt Will Transfer to Toilet: with mod assist;with +2 assist;bedside commode;stand pivot transfer (LRAD) Additional ADL Goal #1: Pt will engage in ADL task requiring MIN VC for sequencing with pt able to follow simple commands with increased time to complete, 5/5 opportunities.  OT Frequency: Min 3X/week    Co-evaluation PT/OT/SLP Co-Evaluation/Treatment: Yes Reason for Co-Treatment: For  patient/therapist safety;To address functional/ADL transfers PT goals addressed during session: Mobility/safety with mobility OT goals addressed during session: ADL's and self-care      AM-PAC OT "6 Clicks" Daily Activity     Outcome Measure Help from another person eating meals?: A Lot Help from another person taking care of personal grooming?: A Lot Help from another person toileting, which includes using toliet, bedpan, or urinal?: Total Help from another person bathing (including washing, rinsing, drying)?: A Lot Help from another person to put on and taking off regular upper body clothing?: A Lot Help from another person to put on and taking off regular lower body clothing?: A Lot 6 Click Score: 11   End of Session Nurse Communication: Mobility status  Activity Tolerance: Patient tolerated treatment well Patient left: in bed;with call bell/phone within reach;with bed alarm set  OT Visit Diagnosis: Other abnormalities of gait and mobility (R26.89);Hemiplegia and hemiparesis;Other symptoms and signs involving cognitive function Hemiplegia - Right/Left: Left Hemiplegia - dominant/non-dominant: Non-Dominant Hemiplegia - caused by: Cerebral infarction                Time: 1443-1510 OT Time Calculation (min): 27 min Charges:  OT General Charges $OT Visit: 1 Visit OT Evaluation $OT Eval High Complexity: 1 High  Arman Filter., MPH, MS, OTR/L ascom [phone] 03/04/22, 3:53 PM

## 2022-03-04 NOTE — Progress Notes (Signed)
Patient extubated successfully ?No acute resp issues ?Alert and awake follows commands ?Left sided weakness ? ?Can transfer to [facility] and SD status now ? ?Lucie Leather, M.D.  ?[facility]  ?Medical Director [facility] ?Medical Director [facility] Cardio-Pulmonary Department  ? ? ? ?

## 2022-03-04 NOTE — Progress Notes (Signed)
Nutrition Follow Up Note  ? ?DOCUMENTATION CODES:  ? ?Obesity unspecified ? ?INTERVENTION:  ? ?RD will add supplements and MVI once pt's diet is advanced.  ? ?NUTRITION DIAGNOSIS:  ? ?Inadequate oral intake related to inability to eat (pt sedated and ventilated) as evidenced by NPO status. ? ?GOAL:  ? ?Patient will meet greater than or equal to 90% of their needs ? ?MONITOR:  ? ?Diet advancement, Labs, Weight trends, Skin, I & O's ? ?ASSESSMENT:  ? 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 ? ?CT scan from 4/21 reports new MCA infarcts  ? ?Pt extubated this morning. SLP evaluation pending. RD will add supplements and MVI once pt's diet is advanced. Per chart, pt is up 13lbs(7%) from her UBW; pt +4.0L on her I & Os.  ? ?Medications reviewed and include: aspirin, plavix, colace, insulin, reglan, protonix, KCl, unasyn ? ?Labs reviewed: Na 133(L), creat 0.34(L), K 3.9 wnl, P 4.3 wnl, Mg 2.2 wnl ?Hgb 9.5(L), Hct 27.4(L) ?Cbgs- 109, 152, 164 x 24 hrs ? ?Diet Order:   ?Diet Order   ? ?       ?  Diet NPO time specified  Diet effective now       ?  ? ?  ?  ? ?  ? ?EDUCATION NEEDS:  ? ?No education needs have been identified at this time ? ?Skin:  Skin Assessment: Skin Integrity Issues: ?Skin Integrity Issues:: Incisions ?Incisions: closed lt neck ? ?Last BM:  4/23- TYPE 6 ? ?Height:  ? ?Ht Readings from Last 1 Encounters:  ?02/25/22 5\' 2"  (1.575 m)  ? ? ?Weight:  ? ?Wt Readings from Last 1 Encounters:  ?03/04/22 82.9 kg  ? ? ?Ideal Body Weight:  50 kg ? ?BMI:  Body mass index is 33.43 kg/m?. ? ?Estimated Nutritional Needs:  ? ?Kcal:  1800-2100kcal/day ? ?Protein:  90-105g/day ? ?Fluid:  1.5-1.8L/day ? ?03/06/22 MS, RD, LDN ?Please refer to [facility] for RD and/or RD on-call/weekend/after hours pager ? ?

## 2022-03-04 NOTE — Evaluation (Signed)
Physical Therapy Evaluation ?Patient Details ?Name: Veronica Murray ?MRN: [id number] ?DOB: 05-27-1956 ?Today's Date: 03/04/2022 ? ?History of Present Illness ? 66 yo F transferred emergently to [facility] ED for emergent vascular intervention on a ruptured carotid endarterectomy wound. Intubated on 4/16, extubated on 4/24. 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 Petechial hemorrhage at this site."  ?Clinical Impression ? CO-evaluation performed this date. Pt is a pleasant 66 year old female who was admitted for emergent vascular intervention with post op complication including CVA and intubation. Pt performs bed mobility mod/max assist +2 and unable to perform transfers/ambulation this date. Pt is very soft spoken, difficult to understand and is currently confused although can state she is in hospital and the date as "1923". No family at bedside, history obtained from RN and chart. Pt demonstrates deficits with strength (L<R)/cognition/mobility/coordination/sensation. Unsure of accuracy with sensation/coordination testing with spontaneous movement and easily distracted. Appears to have L inattention, however is able to demonstrate use of L hemibody. Doesn't appear to be at baseline level. Currently on 5L of O2, extubated this date. Would benefit from skilled PT to address above deficits and promote optimal return to PLOF; recommend transition to STR upon discharge from acute hospitalization. ? ?   ? ?Recommendations for follow up therapy are one component of a multi-disciplinary discharge planning process, led by the attending physician.  Recommendations may be updated based on patient status, additional functional criteria and insurance authorization. ? ?Follow Up Recommendations Skilled nursing-short term rehab (<3 hours/day) ? ?  ?Assistance Recommended at Discharge  Frequent or constant Supervision/Assistance  ?Patient can return home with the following ? Two people to help with walking and/or transfers;Two people to help with bathing/dressing/bathroom;Help with stairs or ramp for entrance ? ?  ?Equipment Recommendations  (TBD)  ?Recommendations for Other Services ?    ?  ?Functional Status Assessment Patient has had a recent decline in their functional status and demonstrates the ability to make significant improvements in function in a reasonable and predictable amount of time.  ? ?  ?Precautions / Restrictions Precautions ?Precautions: Fall ?Restrictions ?Weight Bearing Restrictions: No  ? ?  ? ?Mobility ? Bed Mobility ?Overal bed mobility: Needs Assistance ?Bed Mobility: Rolling ?Rolling: Max assist, Mod assist, +2 for physical assistance ?  ?  ?  ?  ?General bed mobility comments: cues for sequencing. Able to initiate with B LEs, however trunkal/hips ratio disassociated and uncoordinated. Needs assist for mobility initiation. Once in sidelying, able to maintain with cga. Needs +2 assist for repositioning including scooting up towards HOB. Able to attempt bridging and assistance with B LEs. All mobility performed on 5L of O2 with sats WNL throughout exertion ?  ? ?Transfers ?  ?  ?  ?  ?  ?  ?  ?  ?  ?General transfer comment: not safe to attempt due to loose BMs with exertion ?  ? ?Ambulation/Gait ?  ?  ?  ?  ?  ?  ?  ?  ? ?Stairs ?  ?  ?  ?  ?  ? ?Wheelchair Mobility ?  ? ?Modified Rankin (Stroke Patients Only) ?  ? ?  ? ?Balance   ?  ?  ?  ?Postural control: Left lateral lean ?  ?  ?  ?  ?  ?  ?  ?  ?  ?  ?  ?  ?  ?  ?   ? ? ? ?  Pertinent Vitals/Pain Pain Assessment ?Pain Assessment: No/denies pain  ? ? ?Home Living Family/patient expects to be discharged to:: Unsure ?Living Arrangements: Alone ?  ?  ?  ?  ?  ?  ?  ?  ?Additional Comments: Per chart/RN, pt was most recently living in hotel after separating from spouse. Pt reports living with her husband in a mobile home.  Pt is poor historian and unsure of accuracy  ?  ?Prior Function Prior Level of Function : Patient poor historian/Family not available ?  ?  ?  ?  ?  ?  ?Mobility Comments: per patient, she was ambulatory using RW and reports no falls. ?ADLs Comments: per patient, she needed assistance for ADLs from husband ?  ? ? ?Hand Dominance  ? Dominant Hand: Right ? ?  ?Extremity/Trunk Assessment  ? Upper Extremity Assessment ?Upper Extremity Assessment: Defer to OT evaluation;Difficult to assess due to impaired cognition ?  ? ?Lower Extremity Assessment ?Lower Extremity Assessment: Generalized weakness;Difficult to assess due to impaired cognition (appears to have weakness/sensation deficits on L LE. At least 3/5; able to demonstrate movement spontaneously) ?  ? ?   ?Communication  ? Communication:  (soft spoken)  ?Cognition Arousal/Alertness: Awake/alert ?Behavior During Therapy: Flat affect ?Overall Cognitive Status: Impaired/Different from baseline ?  ?  ?  ?  ?  ?  ?  ?  ?  ?  ?  ?  ?  ?  ?  ?  ?General Comments: easily distracted, spontaneous movement noted, L side inattention and decreased response. Very soft spoken, difficult to understand. ?  ?  ? ?  ?General Comments   ? ?  ?Exercises Other Exercises ?Other Exercises: rolling to B sides for hygiene and linen replacement. Easily distracted. Able to attempt hygiene indep, however then needs max assist for completed task.  ? ?Assessment/Plan  ?  ?PT Assessment Patient needs continued PT services  ?PT Problem List Decreased strength;Decreased balance;Decreased mobility;Decreased coordination;Decreased cognition;Decreased knowledge of use of DME;Decreased safety awareness;Cardiopulmonary status limiting activity;Impaired sensation ? ?   ?  ?PT Treatment Interventions DME instruction;Gait training;Therapeutic activities;Therapeutic exercise;Balance training   ? ?PT Goals (Current goals can be found in the Care Plan section)  ?Acute Rehab PT Goals ?Patient Stated Goal:  unable to state ?PT Goal Formulation: Patient unable to participate in goal setting ?Time For Goal Achievement: 03/18/22 ?Potential to Achieve Goals: Good ? ?  ?Frequency 7X/week ?  ? ? ?Co-evaluation PT/OT/SLP Co-Evaluation/Treatment: Yes ?Reason for Co-Treatment: Complexity of the patient's impairments (multi-system involvement);To address functional/ADL transfers;Necessary to address cognition/behavior during functional activity ?PT goals addressed during session: Mobility/safety with mobility ?OT goals addressed during session: ADL's and self-care ?  ? ? ?  ?AM-PAC PT "6 Clicks" Mobility  ?Outcome Measure Help needed turning from your back to your side while in a flat bed without using bedrails?: A Lot ?Help needed moving from lying on your back to sitting on the side of a flat bed without using bedrails?: A Lot ?Help needed moving to and from a bed to a chair (including a wheelchair)?: Total ?Help needed standing up from a chair using your arms (e.g., wheelchair or bedside chair)?: Total ?Help needed to walk in hospital room?: Total ?Help needed climbing 3-5 steps with a railing? : Total ?6 Click Score: 8 ? ?  ?End of Session Equipment Utilized During Treatment: Oxygen ?Activity Tolerance: Patient tolerated treatment well ?Patient left: in bed ?Nurse Communication: Mobility status ?PT Visit Diagnosis: Muscle weakness (generalized) (M62.81);Difficulty in walking, not  elsewhere classified (R26.2);Hemiplegia and hemiparesis ?Hemiplegia - Right/Left: Left ?Hemiplegia - dominant/non-dominant: Non-dominant ?Hemiplegia - caused by: Cerebral infarction ?  ? ?Time: [id number] ?PT Time Calculation (min) (ACUTE ONLY): 28 min ? ? ?Charges:   PT Evaluation ?$PT Eval Moderate Complexity: 1 Mod ?PT Treatments ?$Therapeutic Activity: 8-22 mins ?  ?   ? ? ?Elizabeth PalauStephanie Chevette Fee, PT, DPT, GCS ?[phone] ? ? ?Norene Oliveri ?03/04/2022, 4:38 PM ? ?

## 2022-03-04 NOTE — TOC CM/SW Note (Cosign Needed)
RE: Veronica Murray ?Date of Birth: 1956-08-06 ?Date: 03/04/2022 ? ? ?To Whom It May Concern: ? ?Please be advised that the above-named patient will require a short-term nursing home stay - anticipated 30 days or less for rehabilitation and strengthening.  The plan is for return home. ?

## 2022-03-04 NOTE — Progress Notes (Addendum)
0800 patient following commands sedation decreased placed pt on wean cuff leak present ?0820 sedation off  ?0921 patient extubated to 4 L [place] family at bedside ?1000 patient able to speak her name year and birthday when asked NIHSS shows left sided facial droop and left sided weakness family at bedside ?

## 2022-03-04 NOTE — TOC Progression Note (Signed)
Transition of Care (TOC) - Progression Note  ? ? ?Patient Details  ?Name: CALEY CIARAMITARO ?MRN: [id number] ?Date of Birth: [birth year] ? ?Transition of Care (TOC) CM/SW Contact  ?Shelbie Hutching, RN ?Phone Number: ?03/04/2022, 3:11 PM ? ?Clinical Narrative:    ?Patient was extubated this morning and is tolerating [place].  RNCM met with patient and her husband at the bedside.  Patient is very weak and has a weak cough, I was unable to understand patient she is not speaking loud enough or clear but is trying to mouth words. ?Husband, Coralyn Mark reports that they have not been living together recently, he says they are on and off with their relationship but were recently seeing about getting back together, she was living in [place] but then came back up to [place], she stayed with her sister for a bit in [place] and then she was in a hotel in [place].  Husband is unsure what they will do at discharge- he would like to get her into some senior housing.  RNCM discussed SNF for short term rehab- they are in agreement with that but do not want her to live in a nursing facility, he has some contacts that he will reach out to for housing opportunities.   ?PT and OT and speech have been consulted.   ? ?TOC will follow.   ? ? ?Expected Discharge Plan: [place] ?Barriers to Discharge: Continued Medical Work up ? ?Expected Discharge Plan and Services ?Expected Discharge Plan: [place] ?  ?Discharge Planning Services: CM Consult ?Post Acute Care Choice: [place] ?Living arrangements for the past 2 months: Hotel/Motel ?                ?DME Arranged: N/A ?DME Agency: NA ?  ?  ?  ?  ?  ?  ?  ?  ? ? ?Social Determinants of Health (SDOH) Interventions ?  ? ?Readmission Risk Interventions ?   ? View : No data to display.  ?  ?  ?  ? ? ?

## 2022-03-04 NOTE — Progress Notes (Signed)
Veronica Murray and Vascular Surgery ? ?Daily Progress Note ? ? ?Subjective  -  ? ?Extubated.  Speech is somewhat difficult to discern.  Working with therapy.  Left-sided weakness present. ? ?Objective ?Vitals:  ? 03/04/22 1328 03/04/22 1329 03/04/22 1330 03/04/22 1430  ?BP:    121/65  ?Pulse: 86 87  80  ?Resp: (!) 26 (!) 30  (!) 29  ?Temp:   97.8 ?F (36.6 ?C)   ?TempSrc:   Axillary   ?SpO2: (!) 87% (!) 89%  100%  ?Weight:      ?Height:      ? ? ?Intake/Output Summary (Last 24 hours) at 03/04/2022 1634 ?Last data filed at 03/04/2022 1316 ?Gross per 24 hour  ?Intake 1042.9 ml  ?Output 2130 ml  ?Net -1087.1 ml  ? ? ?PULM  CTAB ?CV  RRR ?VASC  left neck does not seem to have any further drainage.  Mild swelling in the left neck with appropriate bruising ? ?Laboratory ?CBC ?   ?Component Value Date/Time  ? WBC 9.5 03/04/2022 0346  ? HGB 9.5 (L) 03/04/2022 0346  ? HCT 27.4 (L) 03/04/2022 0346  ? PLT 418 (H) 03/04/2022 0346  ? ? ?BMET ?   ?Component Value Date/Time  ? NA 133 (L) 03/04/2022 0346  ? K 3.9 03/04/2022 0346  ? CL 96 (L) 03/04/2022 0346  ? CO2 30 03/04/2022 0346  ? GLUCOSE 175 (H) 03/04/2022 0346  ? BUN 14 03/04/2022 0346  ? CREATININE 0.34 (L) 03/04/2022 0346  ? CALCIUM 8.5 (L) 03/04/2022 0346  ? GFRNONAA >60 03/04/2022 0346  ? GFRAA >60 06/26/2019 0434  ? ? ?Assessment/Planning: ?POD #8 s/p exploration for bleeding and then postop day #4 from left covered stent placement to avoid further bleeding ? ?Extubated and relatively alert. ?Right sided stroke fairly significant. ?PT and OT ?Will likely need placement at discharge ? ?Veronica Murray ? ?03/04/2022, 4:34 PM ? ? ? ?  ?

## 2022-03-04 NOTE — NC FL2 (Signed)
?[place] MEDICAID FL2 LEVEL OF CARE SCREENING TOOL  ?  ? ?IDENTIFICATION  ?Patient Name: ?Veronica Murray Birthdate: 1956/09/16 Sex: female Admission Date (Current Location): ?02/24/2022  ?[place] and [place] Number: ? [place] ?  Facility and Address:  ?[facility], [address] [place], [place] [address] ?     Provider Number: ?[id number]  ?Attending Physician Name and Address:  ?Flora Lipps, MD ? Relative Name and Phone Number:  ?  ?   ?Current Level of Care: ?Hospital Recommended Level of Care: ?[place] Prior Approval Number: ?  ? ?Date Approved/Denied: ?  PASRR Number: ?Manual review ? ?Discharge Plan: ?SNF ?  ? ?Current Diagnoses: ?Patient Active Problem List  ? Diagnosis Date Noted  ? Post-operative state 02/25/2022  ? 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 Adjustment disorder with mixed anxiety and depressed mood 01/25/2022  ? Carotid stenosis, left 01/24/2022  ? Carotid stenosis 01/18/2022  ? CAD (coronary artery disease) 01/18/2022  ? Essential hypertension 01/18/2022  ? Abnormal stress test   ? Troponin I above reference range 06/25/2019  ? ? ?Orientation RESPIRATION BLADDER Height & Weight   ?  ?Self, Situation, Place ? O2 (Nasal Cannula 5 L) Continent, Indwelling catheter Weight: 182 lb 12.2 oz (82.9 kg) ?Height:  5\' 2"  (157.5 cm)  ?BEHAVIORAL SYMPTOMS/MOOD NEUROLOGICAL BOWEL NUTRITION STATUS  ? (None)  (None) Incontinent Diet (To be determined. Currently NPO. Final diet recommendatiosn will be on discharge summary.)  ?AMBULATORY STATUS COMMUNICATION OF NEEDS Skin   ?Extensive Assist Verbally Skin abrasions, Bruising, Other (Comment), Surgical wounds (Erythema/redness. Incision left neck (gauze prn).) ?  ?  ?  ?    ?     ?     ? ? ?Personal Care Assistance Level of Assistance  ?Bathing, Feeding, Dressing Bathing Assistance: Maximum assistance ?Feeding assistance: Limited assistance ?Dressing Assistance: Maximum assistance ?   ? ?Functional Limitations Info   ?Sight, Hearing, Speech Sight Info: Adequate ?Hearing Info: Adequate ?Speech Info: Adequate  ? ? ?SPECIAL CARE FACTORS FREQUENCY  ?PT (By licensed PT), OT (By licensed OT)   ?  ?PT Frequency: 5 x week ?OT Frequency: 5 x week ?  ?  ?  ?   ? ? ?Contractures Contractures Info: Not present  ? ? ?Additional Factors Info  ?Code Status, Allergies, Psychotropic Code Status Info: Full code ?Allergies Info: Codeine, Toradol (Ketorolac Tromethamine), Tramadol Hcl ?Psychotropic Info: Adjustment disorder with mixed anxiety/depression. ?  ?  ?   ? ?Current Medications (03/04/2022):  This is the current hospital active medication list ?Current Facility-Administered Medications  ?Medication Dose Route Frequency Provider Last Rate Last Admin  ? 0.9 %  sodium chloride infusion   Intravenous PRN Flora Lipps, MD 10 mL/hr at 03/04/22 1300 Infusion Verify at 03/04/22 1300  ? 0.9 %  sodium chloride infusion  250 mL Intravenous PRN Esco, Miechia A, MD      ? acetaminophen (TYLENOL) tablet 650 mg  650 mg Oral Q4H PRN Esco, Miechia A, MD      ? Ampicillin-Sulbactam (UNASYN) 3 g in sodium chloride 0.9 % 100 mL IVPB  3 g Intravenous Q6H Flora Lipps, MD   Stopped at 03/04/22 1140  ? aspirin chewable tablet 81 mg  81 mg Per Tube Daily Benita Gutter, RPH   81 mg at 03/04/22 0900  ? Chlorhexidine Gluconate Cloth 2 % PADS 6 each  6 each Topical Daily Flora Lipps, MD   6 each at 03/03/22 2128  ? clopidogrel (  PLAVIX) tablet 75 mg  75 mg Per Tube Daily Benita Gutter, RPH   75 mg at 03/04/22 0900  ? docusate (COLACE) 50 MG/5ML liquid 100 mg  100 mg Per Tube BID Rust-Chester, Britton L, NP   100 mg at 03/02/22 0910  ? fentaNYL (SUBLIMAZE) injection 25 mcg  25 mcg Intravenous Q2H PRN Tyler Pita, MD   25 mcg at 03/04/22 0038  ? hydrALAZINE (APRESOLINE) injection 10-20 mg  10-20 mg Intravenous Q6H PRN Rust-Chester, Huel Cote, NP   10 mg at 03/04/22 0144  ? insulin aspart (novoLOG) injection 0-20 Units  0-20 Units Subcutaneous Q4H Bradly Bienenstock, NP   4 Units at 03/04/22 [id number]  ? insulin aspart (novoLOG) injection 3 Units  3 Units Subcutaneous Q4H Darel Hong D, NP   3 Units at 03/02/22 2053  ? labetalol (NORMODYNE) injection 20 mg  20 mg Intravenous Q2H PRN Tyler Pita, MD   20 mg at 03/04/22 1339  ? MEDLINE mouth rinse  15 mL Mouth Rinse BID Flora Lipps, MD      ? metoCLOPramide (REGLAN) injection 5 mg  5 mg Intravenous Q8H Tyler Pita, MD   5 mg at 03/04/22 1335  ? ondansetron (ZOFRAN) injection 4 mg  4 mg Intravenous Q6H PRN Esco, Miechia A, MD      ? oxyCODONE-acetaminophen (PERCOCET/ROXICET) 5-325 MG per tablet 1 tablet  1 tablet Per Tube Q6H PRN Rust-Chester, Toribio Harbour L, NP      ? pantoprazole sodium (PROTONIX) 40 mg/20 mL oral suspension 40 mg  40 mg Per Tube Daily Tyler Pita, MD   40 mg at 03/04/22 0900  ? polyethylene glycol (MIRALAX / GLYCOLAX) packet 17 g  17 g Per Tube Daily Rust-Chester, Britton L, NP   17 g at 03/02/22 0910  ? potassium chloride (KLOR-CON) packet 40 mEq  40 mEq Per Tube BID Tyler Pita, MD   40 mEq at 03/04/22 0900  ? sodium chloride flush (NS) 0.9 % injection 10-40 mL  10-40 mL Intracatheter Q12H Esco, Miechia A, MD   10 mL at 03/04/22 0901  ? sodium chloride flush (NS) 0.9 % injection 10-40 mL  10-40 mL Intracatheter PRN Esco, Miechia A, MD      ? sodium chloride flush (NS) 0.9 % injection 3 mL  3 mL Intravenous Q12H Esco, Miechia A, MD   3 mL at 03/04/22 0901  ? sodium chloride flush (NS) 0.9 % injection 3 mL  3 mL Intravenous PRN Esco, Miechia A, MD      ? ? ? ?Discharge Medications: ?Please see discharge summary for a list of discharge medications. ? ?Relevant Imaging Results: ? ?Relevant Lab Results: ? ? ?Additional Information ?SS#: [id number] ? ?Candie Chroman, LCSW ? ? ? ? ?

## 2022-03-04 NOTE — Evaluation (Signed)
Clinical/Bedside Swallow Evaluation ?Patient Details  ?Name: Veronica Murray ?MRN: [id number] ?Date of Birth: 1956-03-27 ? ?Today's Date: 03/04/2022 ?Time: SLP Start Time (ACUTE ONLY): [id number] SLP Stop Time (ACUTE ONLY): [id number] ?SLP Time Calculation (min) (ACUTE ONLY): 12 min ? ?Past Medical History:  ?Past Medical History:  ?Diagnosis Date  ? Aortic arch atherosclerosis ([place])   ? Diabetes mellitus without complication ([place])   ? Essential hypertension   ? Fibromyalgia   ? Tobacco abuse   ? ?Past Surgical History:  ?Past Surgical History:  ?Procedure Laterality Date  ? CAROTID PTA/STENT INTERVENTION Left 01/24/2022  ? Procedure: CAROTID PTA/STENT INTERVENTION;  Surgeon: Algernon Huxley, MD;  Location: [facility];  Service: Cardiovascular;  Laterality: Left;  ? CAROTID PTA/STENT INTERVENTION Left 02/28/2022  ? Procedure: CAROTID PTA/STENT INTERVENTION;  Surgeon: Algernon Huxley, MD;  Location: [facility];  Service: Cardiovascular;  Laterality: Left;  ? CERVICAL BIOPSY    ? CHOLECYSTECTOMY    ? ENDARTERECTOMY Left 01/25/2022  ? Procedure: ENDARTERECTOMY CAROTID, possible ligation;  Surgeon: Algernon Huxley, MD;  Location: [facility];  Service: Vascular;  Laterality: Left;  ? ENDARTERECTOMY Left 02/25/2022  ? Procedure: ENDARTERECTOMY CAROTID revision;  Surgeon: Evaristo Bury, MD;  Location: [facility];  Service: Vascular;  Laterality: Left;  ? HERNIA REPAIR    ? LEFT HEART CATH AND CORONARY ANGIOGRAPHY Left 07/05/2019  ? Procedure: LEFT HEART CATH AND CORONARY ANGIOGRAPHY;  Surgeon: Wellington Hampshire, MD;  Location: [facility];  Service: Cardiovascular;  Laterality: Left;  ? ?HPI:  ?Per [id number] H&P "Patient s/p LEFT CEA on 01/25/2022 at our facility secondary to TIA and high grade stenosis. The patient had been well, presented for follow up without issue. Noted on 02/20/2022 she developed sudden swelling, pain and murky drainage from the incision. She was evaluated by urgent care on 02/22/2022 and it was felt  she had a wound infection. She was treated with ABX. Last night the patient developed significant bleeding and hypotension to the 70s. Taken by EMS to [facility], digital pressure held, transfused pRBC. Neurologically intact. [facility] Forestine [facility] was unstable, with active bleeding. That facility and Vascular surgeon did not accept the patient. I accepted the patient for emergent intervention. Upon arrival EMS was holding pressure at neck incision, the patient was awake, alert and mentating and aware of situation. She Understood significant risks of bleeding, stroke and death. Consent obtained."  ? ?MRI 03/02/22 "Motion degraded exam. ?  ?Redemonstrated moderate-to-large acute/early subacute right MCAterritory infarct within the cortical/subcortical right frontal lobe ?as well as right insula and subinsular region. Petechial hemorrhage ?at this site. Local mass effect without significant ventricular ?effacement. No midline shift. ?  ?Multiple smaller acute/early subacute cortical and subcortical ?infarcts within the left frontoparietal lobes, posterior left ?temporal lobe and lateral left occipital lobe (left MCA vascular ?territory, as well as left MCA/ACA and left MCA/PCA watershed ?territories). Petechial hemorrhage associated with some of these ?infarcts. These infarcts are superimposed upon chronic infarcts ?within the left MCA vascular territory and left watershed territory. ?  ?Background mild chronic small ischemic changes within the cerebral ?white matter. ?  ?Paranasal sinus disease, as described. ?  ?Trace fluid within the bilateral mastoid air cells." ? ?CXR 03/03/22 "1. No acute cardiopulmonary disease identified. ?2. Endotracheal tube is identified distal tip 8.3 cm from carina. ?Advancement by 3 cm is recommended." ?  ?  Assessment / Plan / Recommendation  ?Clinical Impression ? Pt seen for clinical swallowing  evaluation. Pt alert. R gaze preference noted.  Flat affect. Aphonic vocal quality. Wet respirations. On 5L/min O2 via [place]. Requring frequent suctioning of retrain oral and pharyngeal secretions which is concerning for reduced secretions management. Cleared with RN.  ? ?Significant L orofacial weakness appreciated during oral motor examination as well as suspected reduced orofacial sensation. Pt with sparse condition in poor condition.  ? ?Pt given x2 trials of ice chips. Pt with s/sx severe oral dysphagia c/b oral holding, pocketing in L lateral sulcus, and eventual anterior loss on L. Pharyngeal swallow could not be assessed given anterior loss and inability to transit posteriorly. Additional trials deferred due to severity of pt's clinical presentation. ? ?At present, a safe oral diet cannot be recommended. Recommend NPO with consideration for short term alternate route of nutrition/hydration/medication if in alignment with GOC.  ? ?Pt is at increased risk for aspiration/aspiration PNA given mental status, dental status, recent/prolonged intubation with reduced secretion management and aphonic vocal quality, and recent stroke.  ? ?SLP to f/u per POC for clincial swallowing re-evaluation as appropriate. Pt would likely also benefit from cognitive-linguistic evaluation when appropriate.  ? ?Pt, RN, and MD made aware of results, recommendation, and SLP POC. ?full understanding by pt.  ? ? ?SLP Visit Diagnosis: Dysphagia, oropharyngeal phase (R13.12) ?   ?Aspiration Risk ? Severe aspiration risk;Risk for inadequate nutrition/hydration  ?  ?Diet Recommendation NPO;Alternative means - temporary  ? ?Medication Administration: Via alternative means  ?  ?Other  Recommendations Oral Care Recommendations: Oral care QID;Staff/trained caregiver to provide oral care ?Other Recommendations: Have oral suction available   ? ?Recommendations for follow up therapy are one component of a multi-disciplinary discharge planning process,  led by the attending physician.  Recommendations may be updated based on patient status, additional functional criteria and insurance authorization. ? ?Follow up Recommendations Skilled nursing-short term rehab (<3 hours/day)  ? ? ?  ?Assistance Recommended at Discharge Frequent or constant Supervision/Assistance  ?Functional Status Assessment Patient has had a recent decline in their functional status and demonstrates the ability to make significant improvements in function in a reasonable and predictable amount of time.  ?Frequency and Duration min 2x/week  ?2 weeks ?  ?   ? ?Prognosis Prognosis for Safe Diet Advancement: Guarded ?Barriers to Reach Goals: Cognitive deficits;Severity of deficits  ? ?  ? ?Swallow Study   ?General Date of Onset: 02/24/22 ?HPI: Per [id number] H&P "Patient s/p LEFT CEA on 01/25/2022 at our facility secondary to TIA and high grade stenosis. The patient had been well, presented for follow up without issue. Noted on 02/20/2022 she developed sudden swelling, pain and murky drainage from the incision. She was evaluated by urgent care on 02/22/2022 and it was felt she had a wound infection. She was treated with ABX. Last night the patient developed significant bleeding and hypotension to the 70s. Taken by EMS to [facility], digital pressure held, transfused pRBC. Neurologically intact. [facility] Forestine [facility] patient was unstable, with active bleeding. That facility and Vascular surgeon did not accept the patient. I accepted the patient for emergent intervention. Upon arrival EMS was holding pressure at neck incision, the patient was awake, alert and mentating and aware of situation. She Understood significant risks of bleeding, stroke and death. Consent obtained." ?Type of Study: Bedside Swallow Evaluation ?Previous Swallow Assessment: unknown ?  Diet Prior to this Study: NPO ?Respiratory Status: Nasal  cannula ?History of Recent Intubation: Yes ?Length of Intubations (days): 8 days ?Date extubated: 03/04/22 ?Behavior/Cognition: Alert;Requires cueing;Distractible ?Oral Cavity Assessment: Excessive secre

## 2022-03-04 NOTE — Procedures (Signed)
Extubation Procedure Note ? ?Patient Details:   ?Name: Veronica Murray ?DOB: 1955/12/11 ?MRN: [id number] ?  ?Airway Documentation:  ?  ?Vent end date: 03/04/22 Vent end time: 0921  ? ?Evaluation ? O2 sats: stable throughout and currently acceptable ?Complications: No apparent complications ?Patient did tolerate procedure well. ?Bilateral Breath Sounds: Clear, Diminished ?  ?Yes ? ?Patient was extubated to a 4L [place]. Cuff leak was heard. No stridor was noted, RN and CCM MD were at the bedside during extubation. ? ?Darolyn Rua ?03/04/2022, 9:40 AM ? ?

## 2022-03-04 NOTE — Progress Notes (Addendum)
1445 Spoke with Veronica Murray informed him that patient orientated and wants daughter to come see her Veronica Murray upset that he thinks patient daughter Veronica Murray is telling patient not to listen to him. Veronica Murray states he will get a personal protection order against patients daughter Veronica Murray that his daughter Veronica Murray is his witness that patients other daughter was telling patient not to listen to husband so patients daughter Veronica Murray can not see patient. Per patient she and husband not living together at time of patients admission they were separated and she was living in a hotel. Patient Husband Veronica Murray wants someone in room at all times with patient when her other kids are visiting Veronica Murray informed that again patient wants daughter Veronica Murray and other 7 kids here.Veronica Murray also informed that no personal information with out password will be given out until patient can make those changes. Veronica Murray up the phone and states then let Veronica Murray take care of all of Veronica Murray needs and her clothes. Called Veronica Murray back informed him again that we would not give information out but patient can have visitors Veronica Murray informed that patient is answering question alert to self, bday, that she is in the hospital in [place] and why she actually came In for her neck bleeding. Veronica Murray states fine. Im her husband and I will take care of her now. She not going home with her other daughter Veronica Murray informed that patient will need therapy after this massive Stroke Veronica Murray states that now she will get free housing and he will have therapy come to the home no that they will qualify for free housing. Veronica Murray informed that patient is total care at this time and that he needs assistance himself and that when patient was ready for discharge options will be discussed with patient and him if patient is unable to make decisions or care for herself at that time.  ?1325 Patient husband called wants a discharge date to get for free housing. He states that patient was living in a hotel and that  he already rented a place for himself but he wants therapy to come to a house that they can now get together since patient has had stroke. ?

## 2022-03-04 NOTE — Consult Note (Signed)
PHARMACY CONSULT NOTE - FOLLOW UP ? ?Pharmacy Consult for Electrolyte Monitoring and Replacement  ? ?Recent Labs: ?Potassium (mmol/L)  ?Date Value  ?03/04/2022 3.9  ? ?Magnesium (mg/dL)  ?Date Value  ?03/04/2022 2.2  ? ?Calcium (mg/dL)  ?Date Value  ?03/04/2022 8.5 (L)  ? ?Albumin (g/dL)  ?Date Value  ?03/03/2022 2.4 (L)  ? ?Phosphorus (mg/dL)  ?Date Value  ?03/04/2022 4.3  ? ?Sodium (mmol/L)  ?Date Value  ?03/04/2022 133 (L)  ? ?4/24: Corrected Calcium 9.8 mg/dL ? ?Assessment: ?66 year old female presenting with ruptured carotic endarterectomy wound. 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 Patient is intubated and sedated in the ICU. Pharmacy has been consulted to manage electrolytes. ?  ?Renal function consistent with baseline. (Scr 0.34 on 4/24 0346) ?  ?Off propofol infusion and fentanyl boluses, and norepinephrine. Pt is off feeding tube, and currently on SSI. ? ?K is close to goal at 3.9 mmol/dL ? ?Goal of Therapy:  ?K 4.0 - 5.0 ?Mg 2.0 - 2.4 ?Other electrolytes WNL ? ?Plan:  ?KCl 40 mEq per tube ordered by medical team x1 more dose. ?F/u AM labs. ? ?Rebbeca Paul, PharmD Candidate [place] 2025 ?03/04/2022 12:36 PM  ?

## 2022-03-04 NOTE — Plan of Care (Addendum)
PMT note: ? ?Spoke with CCM, patient no longer needs PMT consult. Will D/C consult. Please let us know if you need anything further.  ?

## 2022-03-05 ENCOUNTER — Inpatient Hospital Stay: Payer: Medicare HMO

## 2022-03-05 DIAGNOSIS — T827XXA Infection and inflammatory reaction due to other cardiac and vascular devices, implants and grafts, initial encounter: Secondary | ICD-10-CM | POA: Diagnosis not present

## 2022-03-05 DIAGNOSIS — D62 Acute posthemorrhagic anemia: Secondary | ICD-10-CM | POA: Diagnosis not present

## 2022-03-05 DIAGNOSIS — I639 Cerebral infarction, unspecified: Secondary | ICD-10-CM | POA: Diagnosis not present

## 2022-03-05 DIAGNOSIS — R1310 Dysphagia, unspecified: Secondary | ICD-10-CM

## 2022-03-05 DIAGNOSIS — J9621 Acute and chronic respiratory failure with hypoxia: Secondary | ICD-10-CM

## 2022-03-05 DIAGNOSIS — D649 Anemia, unspecified: Secondary | ICD-10-CM

## 2022-03-05 DIAGNOSIS — I69391 Dysphagia following cerebral infarction: Secondary | ICD-10-CM

## 2022-03-05 DIAGNOSIS — D75839 Thrombocytosis, unspecified: Secondary | ICD-10-CM

## 2022-03-05 DIAGNOSIS — D72829 Elevated white blood cell count, unspecified: Secondary | ICD-10-CM

## 2022-03-05 DIAGNOSIS — E119 Type 2 diabetes mellitus without complications: Secondary | ICD-10-CM

## 2022-03-05 DIAGNOSIS — E876 Hypokalemia: Secondary | ICD-10-CM

## 2022-03-05 DIAGNOSIS — J9622 Acute and chronic respiratory failure with hypercapnia: Secondary | ICD-10-CM

## 2022-03-05 DIAGNOSIS — I6389 Other cerebral infarction: Secondary | ICD-10-CM | POA: Diagnosis not present

## 2022-03-05 DIAGNOSIS — I772 Rupture of artery: Secondary | ICD-10-CM | POA: Diagnosis not present

## 2022-03-05 DIAGNOSIS — E871 Hypo-osmolality and hyponatremia: Secondary | ICD-10-CM

## 2022-03-05 DIAGNOSIS — Z9889 Other specified postprocedural states: Secondary | ICD-10-CM | POA: Diagnosis not present

## 2022-03-05 LAB — GLUCOSE, CAPILLARY
Glucose-Capillary: 93 mg/dL (ref 70–99)
Glucose-Capillary: 103 mg/dL — ABNORMAL HIGH (ref 70–99)
Glucose-Capillary: 116 mg/dL — ABNORMAL HIGH (ref 70–99)
Glucose-Capillary: 163 mg/dL — ABNORMAL HIGH (ref 70–99)
Glucose-Capillary: 161 mg/dL — ABNORMAL HIGH (ref 70–99)

## 2022-03-05 LAB — CBC
HCT: 28.5 % — ABNORMAL LOW (ref 36.0–46.0)
Hemoglobin: 9.6 g/dL — ABNORMAL LOW (ref 12.0–15.0)
MCH: 29.6 pg (ref 26.0–34.0)
MCHC: 33.7 g/dL (ref 30.0–36.0)
MCV: 88 fL (ref 80.0–100.0)
Platelets: 531 10*3/uL — ABNORMAL HIGH (ref 150–400)
RBC: 3.24 MIL/uL — ABNORMAL LOW (ref 3.87–5.11)
RDW: 12.7 % (ref 11.5–15.5)
WBC: 14.5 10*3/uL — ABNORMAL HIGH (ref 4.0–10.5)
nRBC: 0 % (ref 0.0–0.2)

## 2022-03-05 LAB — BASIC METABOLIC PANEL
Anion gap: 8 (ref 5–15)
BUN: 16 mg/dL (ref 8–23)
CO2: 28 mmol/L (ref 22–32)
Calcium: 8.3 mg/dL — ABNORMAL LOW (ref 8.9–10.3)
Chloride: 97 mmol/L — ABNORMAL LOW (ref 98–111)
Creatinine, Ser: 0.39 mg/dL — ABNORMAL LOW (ref 0.44–1.00)
GFR, Estimated: >60 mL/min (ref >60)
Glucose, Bld: 98 mg/dL (ref 70–99)
Potassium: 3.1 mmol/L — ABNORMAL LOW (ref 3.5–5.1)
Sodium: 133 mmol/L — ABNORMAL LOW (ref 135–145)

## 2022-03-05 LAB — CULTURE, BLOOD (ROUTINE X 2)
Culture: NO GROWTH
Culture: NO GROWTH
Special Requests: ADEQUATE

## 2022-03-05 LAB — CULTURE, RESPIRATORY W GRAM STAIN
Culture: NORMAL
Gram Stain: NONE SEEN

## 2022-03-05 LAB — PHOSPHORUS: Phosphorus: 3.9 mg/dL (ref 2.5–4.6)

## 2022-03-05 LAB — MAGNESIUM: Magnesium: 2 mg/dL (ref 1.7–2.4)

## 2022-03-05 IMAGING — DX DG ABD PORTABLE 1V
1 series · 1 of 1 positions shown · non-contrast
Comparison: Radiograph [DATE]

CLINICAL DATA: Feeding tube placement

EXAM:
PORTABLE ABDOMEN - 1 VIEW

[abdomen supine]
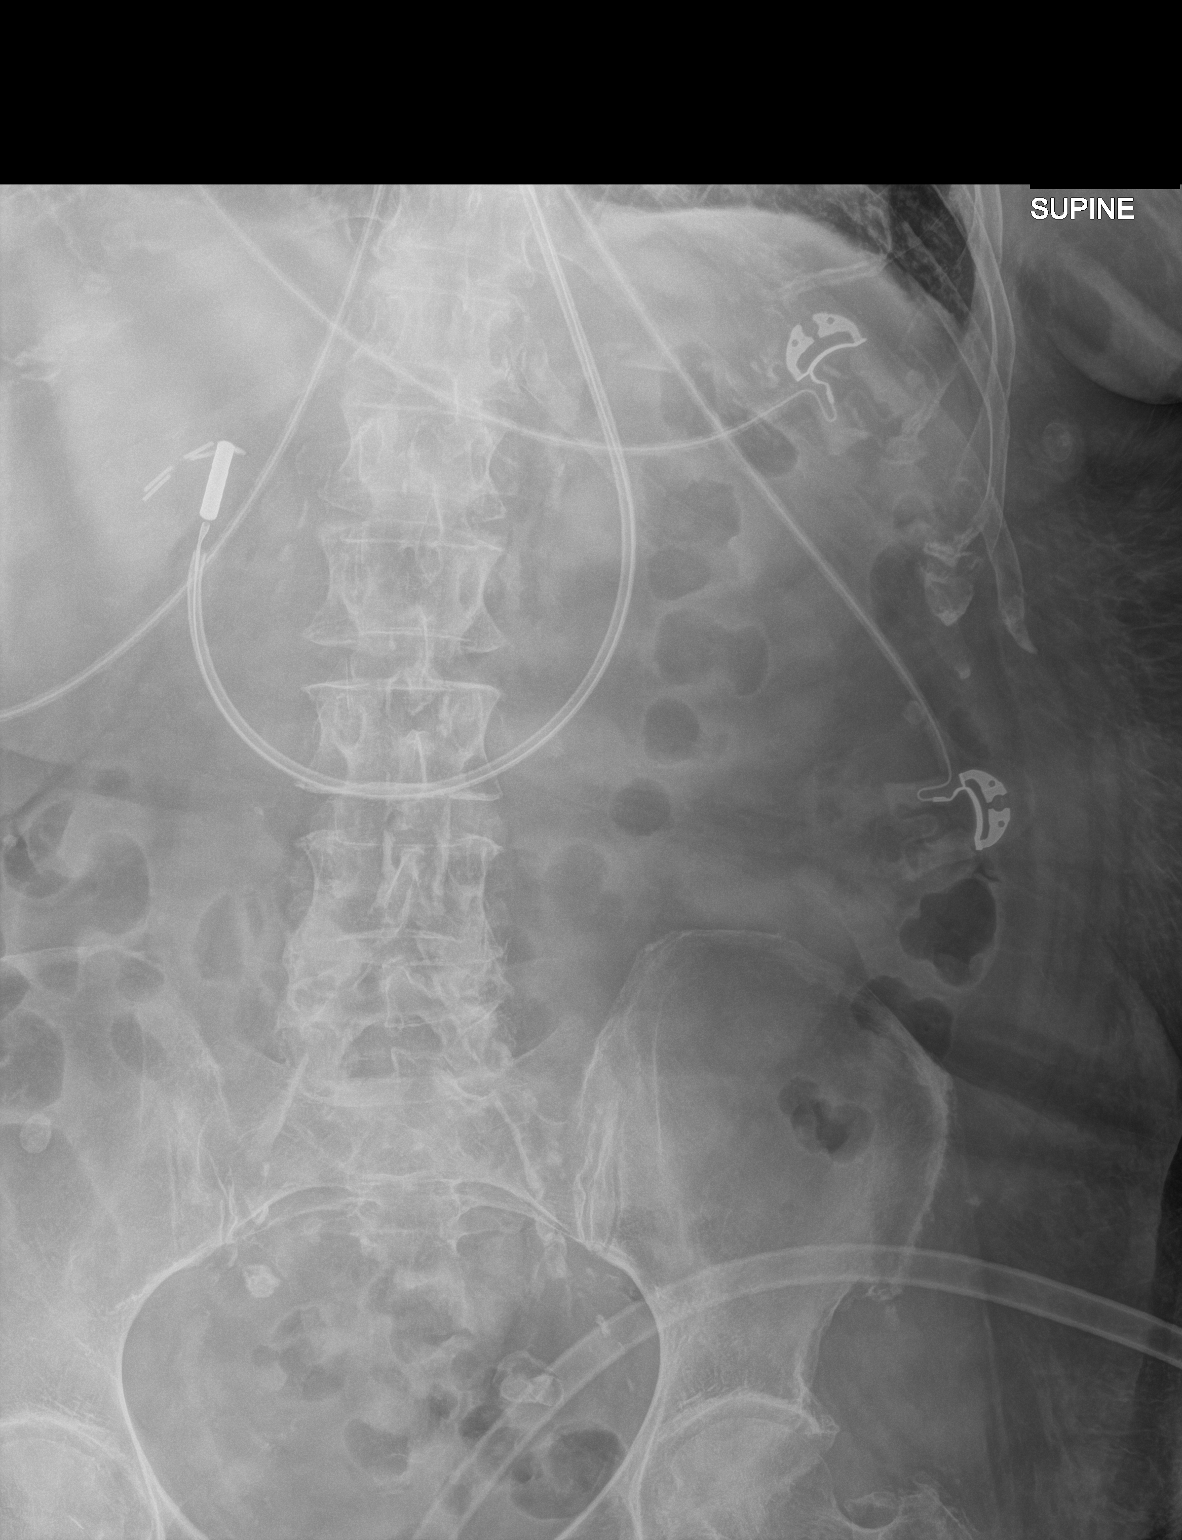

[1 of 1 positions shown; findings below may reference images not displayed]

FINDINGS: There is a weighted tip feeding tube with tip overlying the right
upper quadrant, in the region of the gastric antrum/proximal
duodenum. No evidence of bowel obstruction.
IMPRESSION: Feeding tube tip overlies the right upper quadrant, in the region of
the gastric antrum/proximal duodenum.

## 2022-03-05 MED ORDER — FREE WATER
30.0000 mL | Status: DC
  Administered 2022-03-05 – 2022-03-06 (×4): 30 mL

## 2022-03-05 MED ORDER — OSMOLITE 1.5 CAL PO LIQD
1000.0000 mL | ORAL | Status: DC
  Administered 2022-03-05: 1000 mL

## 2022-03-05 MED ORDER — PROSOURCE TF PO LIQD
45.0000 mL | Freq: Two times a day (BID) | ORAL | Status: DC
  Administered 2022-03-05: 45 mL
  Filled 2022-03-05 (×2): qty 45

## 2022-03-05 MED ORDER — POTASSIUM CHLORIDE 10 MEQ/50ML IV SOLN
10.0000 meq | INTRAVENOUS | Status: AC
  Administered 2022-03-05 (×6): 10 meq via INTRAVENOUS
  Filled 2022-03-05 (×6): qty 50

## 2022-03-05 NOTE — Progress Notes (Addendum)
Nutrition Follow Up Note  ? ?DOCUMENTATION CODES:  ? ?Obesity unspecified ? ?INTERVENTION:  ? ?Osmolite 1.5_0 /hr- Initiate at 67m/hr and increase by 141mhr q 8 hours until goal rate is reached.  ? ?Pro-Source 4545mID via tube, provides 40kcal and 11g of protein per serving   ? ?Free water flushes 16m54m hours to maintain tube patency  ? ?Regimen provides 2060kcal/day, 105g/day protein and 1186ml25m of free water  ? ?NUTRITION DIAGNOSIS:  ? ?Inadequate oral intake related to inability to eat (pt sedated and ventilated) as evidenced by NPO status. ? ?GOAL:  ? ?Patient will meet greater than or equal to 90% of their needs ?-not met  ? ?MONITOR:  ? ?Diet advancement, Labs, Weight trends, TF tolerance, Skin, I & O's ? ?ASSESSMENT:  ? 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 ? ?CT scan from 4/21 reports new MCA infarcts  ? ?Pt seen by SLP and was unable to be placed on an oral diet. NGT placed and is in good position. Plan is to initiate tube feeds today. Pt did have an episode of vomiting over the weekend; reglan initiated. Will advance tube feeds slowly. Per chart, pt appears to be back to her UBW today; pt +4.1L on her I & Os.  ? ?Medications reviewed and include: aspirin, plavix, colace, insulin, reglan, protonix, unasyn ? ?Labs reviewed: Na 133(L), K 3.1(L), creat 0.39(L), P 3.9 wnl, Mg 2.0 wnl ?Wbc- 14.5(H), Hgb 9.6(L), Hct 28.5(L) ?Cbgs- 116, 103, 93 x 24 hrs ? ?Diet Order:   ?Diet Order   ? ?       ?  Diet NPO time specified  Diet effective now       ?  ? ?  ?  ? ?  ? ?EDUCATION NEEDS:  ? ?No education needs have been identified at this time ? ?Skin:  Skin Assessment: Skin Integrity  Issues: ?Skin Integrity Issues:: Incisions ?Incisions: closed lt neck ? ?Last BM:  4/23- TYPE 6 ? ?Height:  ? ?Ht Readings from Last 1 Encounters:  ?02/25/22 _1  (1.575 m)  ? ? ?Weight:  ? ?Wt Readings from Last 1 Encounters:  ?03/05/22 77.4 kg  ? ? ?Ideal Body Weight:  50 kg ? ?BMI:  Body mass index is 31.21 kg/m?. ? ?Estimated Nutritional Needs:  ? ?Kcal:  1800-2100kcal/day ? ?Protein:  90-105g/day ? ?Fluid:  1.5-1.8L/day ? ?CaseyKoleen DistanceRD, LDN ?Please refer to AMION for RD and/or RD on-call/weekend/after hours pager ? ?

## 2022-03-05 NOTE — Progress Notes (Signed)
Chaplain met with family and nurse. Filled out Advance Directive. Need to be signed by notary in AM ?

## 2022-03-05 NOTE — Progress Notes (Signed)
Physical Therapy Treatment ?Patient Details ?Name: Veronica Murray ?MRN: [id number] ?DOB: December 14, 1955 ?Today's Date: 03/05/2022 ? ? ?History of Present Illness 66 yo F transferred emergently to [facility] ED for emergent vascular intervention on a ruptured carotid endarterectomy wound. Intubated on 4/16, extubated on 4/24. 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 Petechial hemorrhage at this site."  Hospital course significant for L neck exploration with surture repair to L carotid (02/24/22) and placement of embolic protective device L carotid (02/28/22) ? ?  ?PT Comments  ? ? Initially, requiring max cuing for alertness and attention to therapist; however, improved as session progressed and position changed.  Improved spontaneous movement of all extremities this date; however, L inattention and weakness (at least 3-/5) when compared to R UE persists.  ?Significant truncal instability in all planes, constant manual cuing/facilitation for midline orientation and correction to midline in all planes (mod/max assist), but did improve with participation in sitting balance, dynamic reaching activities (periods of min assist). ?Copious oral secretions noted during session; very poor ability to manage.  Discussed with SLP; may benefit from medication to assist with secretion management until swallow function and oral control improves.  SLP to continue assessment and to discuss with physician next date. ?Goal for next session, OOB to chair; patient in agreement and eager to attempt; anticipate need for +2 scoot/squat pivot given truncal weakness/instability. ?  ?Recommendations for follow up therapy are one component of a multi-disciplinary discharge planning process, led by the attending physician.  Recommendations may be updated based on patient status, additional functional criteria  and insurance authorization. ? ?Follow Up Recommendations ? Skilled nursing-short term rehab (<3 hours/day) ?  ?  ?Assistance Recommended at Discharge Frequent or constant Supervision/Assistance  ?Patient can return home with the following Two people to help with walking and/or transfers;Two people to help with bathing/dressing/bathroom;Help with stairs or ramp for entrance ?  ?Equipment Recommendations ?    ?  ?Recommendations for Other Services   ? ? ?  ?Precautions / Restrictions Precautions ?Precautions: Fall ?Restrictions ?Weight Bearing Restrictions: No  ?  ? ?Mobility ? Bed Mobility ?Overal bed mobility: Needs Assistance ?Bed Mobility: Supine to Sit, Sit to Supine ?Rolling: Max assist, +2 for physical assistance ?  ?Supine to sit: Max assist, +2 for physical assistance ?  ?  ?General bed mobility comments: constant cuing, hand-over-hand to initiate and coordinate purposeful movement on command ?  ? ?Transfers ?  ?  ?  ?  ?  ?  ?  ?  ?  ?General transfer comment: not attempted; session emphasis on sitting balance, trunk control ?  ? ?Ambulation/Gait ?  ?  ?  ?  ?  ?  ?  ?General Gait Details: not attempted; session emphasis on sitting balance, trunk control ? ? ?Stairs ?  ?  ?  ?  ?  ? ? ?Wheelchair Mobility ?  ? ?Modified Rankin (Stroke Patients Only) ?  ? ? ?  ?Balance Overall balance assessment: Needs assistance ?Sitting-balance support: No upper extremity supported, Feet supported ?Sitting balance-Leahy Scale: Poor ?  ?Postural control: Left lateral lean, Posterior lean ?  ?  ?  ?  ?  ?  ?  ?  ?  ?  ?  ?  ?  ?  ?  ? ?  ?Cognition Arousal/Alertness: Awake/alert ?Behavior During Therapy: Flat affect, Restless ?  ?  ?  ?  ?  ?  ?  ?  ?  ?  ?  ?  ?  ?  ?  ?  ?  ?  General Comments: Highly distracted by external environment; persistent L inattention.  Does verbally communciate basic wants needs, but requires max cuing for vocal quality and volume. ?  ?  ? ?  ?Exercises Other Exercises ?Other Exercises:  Unsupported sitting edge of bed, min/mod assist for postural control and midline orientation.  Participated with dynamic reaching activities, alternating between R/L hands, to faciltate L ant/lateral weight shift, lumbar extension/anterior pelvic tilt, visual scanning to L; constant, mod manual facilitation at trunk (M/L, A/P planes) throughout with constant verbal cuing for attention to task.  Very highly distracted by external environment.  Frequent attempts to move outside BOS in unsafe manner, mod/max assist to maintain balance; poor/absent awareness and insight into deficits and need for assist with basic mobility needs. ? ?  ?General Comments   ?  ?  ? ?Pertinent Vitals/Pain Pain Assessment ?Pain Assessment: Faces ?Faces Pain Scale: No hurt  ? ? ?Home Living   ?  ?  ?  ?  ?  ?  ?  ?  ?  ?   ?  ?Prior Function    ?  ?  ?   ? ?PT Goals (current goals can now be found in the care plan section) Acute Rehab PT Goals ?Patient Stated Goal: unable to state ?PT Goal Formulation: Patient unable to participate in goal setting ?Time For Goal Achievement: 03/18/22 ?Potential to Achieve Goals: Fair ?Progress towards PT goals: Progressing toward goals ? ?  ?Frequency ? ? ? 7X/week ? ? ? ?  ?PT Plan Current plan remains appropriate  ? ? ?Co-evaluation   ?  ?  ?  ?  ? ?  ?AM-PAC PT "6 Clicks" Mobility   ?Outcome Measure ? Help needed turning from your back to your side while in a flat bed without using bedrails?: A Lot ?Help needed moving from lying on your back to sitting on the side of a flat bed without using bedrails?: A Lot ?Help needed moving to and from a bed to a chair (including a wheelchair)?: Total ?Help needed standing up from a chair using your arms (e.g., wheelchair or bedside chair)?: Total ?Help needed to walk in hospital room?: Total ?Help needed climbing 3-5 steps with a railing? : Total ?6 Click Score: 8 ? ?  ?End of Session Equipment Utilized During Treatment: Oxygen ?Activity Tolerance: Patient tolerated  treatment well ?Patient left: in bed;with family/visitor present ?Nurse Communication: Mobility status ?PT Visit Diagnosis: Muscle weakness (generalized) (M62.81);Difficulty in walking, not elsewhere classified (R26.2);Hemiplegia and hemiparesis ?Hemiplegia - Right/Left: Left ?Hemiplegia - dominant/non-dominant: Non-dominant ?Hemiplegia - caused by: Cerebral infarction ?  ? ? ?Time: 7793-9030 ?PT Time Calculation (min) (ACUTE ONLY): 38 min ? ?Charges:  $Therapeutic Activity: 8-22 mins ?$Neuromuscular Re-education: 23-37 mins          ?          ?Tuwanna Krausz H. Manson Passey, PT, DPT, NCS ?03/05/22, 5:16 PM ?[phone] ? ? ?

## 2022-03-05 NOTE — Progress Notes (Signed)
SLP Cancellation Note ? ?Patient Details ?Name: Veronica Murray ?MRN: [id number] ?DOB: 28-May-1956 ? ? ?Cancelled treatment:       Reason Eval/Treat Not Completed: Patient not medically ready;Fatigue/lethargy limiting ability to participate;Medical issues which prohibited therapy (reviewed chart and consulted NSG re: pt's status this AM.) ?Pt's RR is increased at 34-41 currently. NSG reported pt had a difficult night. NSG replaced an NGT for TFs for nutrition support. Meds previously given for N/V, per NSG.  ?Due to pt's fatigue and increased RR, will hold on po trials this PM and f/u in the AM. NSG agreed fully. Recommend frequent oral care for stimulation of swallowing, hygiene. ? ? ? ? ?Jerilynn Som, MS, CCC-SLP ?Speech Language Pathologist ?Rehab Services; [facility] ?[phone] (ascom) ?[name] ?03/05/2022, 12:21 PM ?

## 2022-03-05 NOTE — TOC Progression Note (Signed)
Transition of Care (TOC) - Progression Note  ? ? ?Patient Details  ?Name: Veronica Murray ?MRN: [id number] ?Date of Birth: 03/10/1956 ? ?Transition of Care (TOC) CM/SW Contact  ?Allayne Butcher, RN ?Phone Number: ?03/05/2022, 3:17 PM ? ?Clinical Narrative:    ?Spoke with patient at the bedside with the bedside RN present.  Patient expressed that she does not want her husband to make decisions for her, she prefers that it be [name], her daughter.  She and her husband have been separated and she does not want to live with him, she does agree for short term rehab.   ? ? ?Expected Discharge Plan: Skilled Nursing Facility ?Barriers to Discharge: Continued Medical Work up ? ?Expected Discharge Plan and Services ?Expected Discharge Plan: Skilled Nursing Facility ?  ?Discharge Planning Services: CM Consult ?Post Acute Care Choice: Skilled Nursing Facility ?Living arrangements for the past 2 months: Hotel/Motel ?                ?DME Arranged: N/A ?DME Agency: NA ?  ?  ?  ?  ?  ?  ?  ?  ? ? ?Social Determinants of Health (SDOH) Interventions ?  ? ?Readmission Risk Interventions ?   ? View : No data to display.  ?  ?  ?  ? ? ?

## 2022-03-05 NOTE — Progress Notes (Signed)
Subjective: ?Patient extubated. Cannot speak above a whisper, thus difficult to understand, but following all commands ? ?Objective: ?Current vital signs: ?BP (!) 153/65   Pulse 91   Temp 98.5 ?F (36.9 ?C) (Axillary)   Resp 19   Ht 5\' 2"  (1.575 m)   Wt 77.4 kg   SpO2 92%   BMI 31.21 kg/m?  ?Vital signs in last 24 hours: ?Temp:  [97.8 ?F (36.6 ?C)-98.6 ?F (37 ?C)] 98.5 ?F (36.9 ?C) (04/25 0800) ?Pulse Rate:  [61-93] 91 (04/25 0300) ?Resp:  [13-34] 19 (04/25 0300) ?BP: (92-162)/(56-111) 153/65 (04/25 0300) ?SpO2:  [85 %-100 %] 92 % (04/25 0300) ?Weight:  [77.4 kg] 77.4 kg (04/25 0500) ? ?Intake/Output from previous day: ?04/24 0701 - 04/25 0700 ?In: 402.4 [I.V.:110.3; NG/GT:120; IV Piggyback:172.1] ?Out: 820 [Urine:820] ?Intake/Output this shift: ?Total I/O ?In: -  ?Out: 125 [Urine:125] ?Nutritional status:  ?Diet Order   ? ?       ?  Diet NPO time specified  Diet effective now       ?  ? ?  ?  ? ?  ? ? ?Physical Exam  ?HEENT-  [place]/AT. Left CEA operative site with dressing in place.  ?Lungs- normal WOB ?Extremities- Diffuse edema ?  ?Neurological Examination ?Mental Status: Extubated, difficult to understand answers to orientation questions but follows all commands ?Cranial Nerves: ?II: Pupils are equal. Will briefly fixate on visual stimuli. Blinks to threat in temporal visual fields bilaterally.  ?III,IV, VI: Eyes are conjugate near the midline. EOMI with some hesitancy on gazing to the left.   ?VII:  face symmetric ?VIII: hearing intact to voice ?Motor/Sensory: ?LUE: Anti-gravity RUE>LUE ?BLE 4/5 ?Weak withdrawal of BLE to noxious plantar stimulation bilaterally, less briskly on the left.  ? ?Lab Results: ?Results for orders placed or performed during the hospital encounter of 02/24/22 (from the past 48 hour(s))  ?Glucose, capillary     Status: None  ? Collection Time: 03/03/22 11:10 AM  ?Result Value Ref Range  ? Glucose-Capillary 72 70 - 99 mg/dL  ?  Comment: Glucose reference range applies only to samples  taken after fasting for at least 8 hours.  ?Renal function panel     Status: Abnormal  ? Collection Time: 03/03/22  1:48 PM  ?Result Value Ref Range  ? Sodium 130 (L) 135 - 145 mmol/L  ? Potassium 3.1 (L) 3.5 - 5.1 mmol/L  ? Chloride 93 (L) 98 - 111 mmol/L  ? CO2 32 22 - 32 mmol/L  ? Glucose, Bld 81 70 - 99 mg/dL  ?  Comment: Glucose reference range applies only to samples taken after fasting for at least 8 hours.  ? BUN 10 8 - 23 mg/dL  ? Creatinine, Ser 0.39 (L) 0.44 - 1.00 mg/dL  ? Calcium 8.3 (L) 8.9 - 10.3 mg/dL  ? Phosphorus 3.6 2.5 - 4.6 mg/dL  ? Albumin 2.4 (L) 3.5 - 5.0 g/dL  ? GFR, Estimated >60 >60 mL/min  ?  Comment: (NOTE) ?Calculated using the CKD-EPI Creatinine Equation (2021) ?  ? Anion gap 5 5 - 15  ?  Comment: Performed at [facility], [address] [place], [place] [id number]  ?Glucose, capillary     Status: None  ? Collection Time: 03/03/22  4:00 PM  ?Result Value Ref Range  ? Glucose-Capillary 81 70 - 99 mg/dL  ?  Comment: Glucose reference range applies only to samples taken after fasting for at least 8 hours.  ?Glucose, capillary     Status:  Abnormal  ? Collection Time: 03/03/22  7:20 PM  ?Result Value Ref Range  ? Glucose-Capillary 102 (H) 70 - 99 mg/dL  ?  Comment: Glucose reference range applies only to samples taken after fasting for at least 8 hours.  ? Comment 1 Notify RN   ? Comment 2 Document in Chart   ?Potassium     Status: None  ? Collection Time: 03/03/22  8:40 PM  ?Result Value Ref Range  ? Potassium 3.5 3.5 - 5.1 mmol/L  ?  Comment: Performed at [facility], [address] [place], [place] [address]  ?Brain natriuretic peptide     Status: Abnormal  ? Collection Time: 03/03/22  8:40 PM  ?Result Value Ref Range  ? B Natriuretic Peptide 154.4 (H) 0.0 - 100.0 pg/mL  ?  Comment: Performed at [facility], [address] [place], [place] [address]  ?Glucose, capillary     Status: Abnormal  ? Collection Time: 03/03/22 11:43 PM  ?Result Value Ref Range   ? Glucose-Capillary 159 (H) 70 - 99 mg/dL  ?  Comment: Glucose reference range applies only to samples taken after fasting for at least 8 hours.  ? Comment 1 Notify RN   ? Comment 2 Document in Chart   ?Glucose, capillary     Status: Abnormal  ? Collection Time: 03/04/22  3:32 AM  ?Result Value Ref Range  ? Glucose-Capillary 164 (H) 70 - 99 mg/dL  ?  Comment: Glucose reference range applies only to samples taken after fasting for at least 8 hours.  ? Comment 1 Notify RN   ? Comment 2 Document in Chart   ?Basic metabolic panel     Status: Abnormal  ? Collection Time: 03/04/22  3:46 AM  ?Result Value Ref Range  ? Sodium 133 (L) 135 - 145 mmol/L  ? Potassium 3.9 3.5 - 5.1 mmol/L  ? Chloride 96 (L) 98 - 111 mmol/L  ? CO2 30 22 - 32 mmol/L  ? Glucose, Bld 175 (H) 70 - 99 mg/dL  ?  Comment: Glucose reference range applies only to samples taken after fasting for at least 8 hours.  ? BUN 14 8 - 23 mg/dL  ? Creatinine, Ser 0.34 (L) 0.44 - 1.00 mg/dL  ? Calcium 8.5 (L) 8.9 - 10.3 mg/dL  ? GFR, Estimated >60 >60 mL/min  ?  Comment: (NOTE) ?Calculated using the CKD-EPI Creatinine Equation (2021) ?  ? Anion gap 7 5 - 15  ?  Comment: Performed at [facility], [address], [place], [place] [address]  ?CBC     Status: Abnormal  ? Collection Time: 03/04/22  3:46 AM  ?Result Value Ref Range  ? WBC 9.5 4.0 - 10.5 K/uL  ? RBC 3.15 (L) 3.87 - 5.11 MIL/uL  ? Hemoglobin 9.5 (L) 12.0 - 15.0 g/dL  ? HCT 27.4 (L) 36.0 - 46.0 %  ? MCV 87.0 80.0 - 100.0 fL  ? MCH 30.2 26.0 - 34.0 pg  ? MCHC 34.7 30.0 - 36.0 g/dL  ? RDW 12.4 11.5 - 15.5 %  ? Platelets 418 (H) 150 - 400 K/uL  ? nRBC 0.0 0.0 - 0.2 %  ?  Comment: Performed at [facility], [address] [place], [place] [address]  ?Magnesium     Status: None  ? Collection Time: 03/04/22  3:46 AM  ?Result Value Ref Range  ? Magnesium 2.2 1.7 - 2.4 mg/dL  ?  Comment: Performed at [facility], [address], [place], [place] [address]  ?Phosphorus     Status:  None  ? Collection Time: 03/04/22  3:46 AM  ?Result Value Ref Range  ? Phosphorus 4.3 2.5 - 4.6 mg/dL  ?  Comment: Performed at [facility], [address], [place], [place] [address]  ?Glucose, capillary     Status: Abnormal  ? Collection Time: 03/04/22  7:27 AM  ?Result Value Ref Range  ? Glucose-Capillary 152 (H) 70 - 99 mg/dL  ?  Comment: Glucose reference range applies only to samples taken after fasting for at least 8 hours.  ?Glucose, capillary     Status: Abnormal  ? Collection Time: 03/04/22 11:21 AM  ?Result Value Ref Range  ? Glucose-Capillary 109 (H) 70 - 99 mg/dL  ?  Comment: Glucose reference range applies only to samples taken after fasting for at least 8 hours.  ?Glucose, capillary     Status: Abnormal  ? Collection Time: 03/04/22  4:27 PM  ?Result Value Ref Range  ? Glucose-Capillary 138 (H) 70 - 99 mg/dL  ?  Comment: Glucose reference range applies only to samples taken after fasting for at least 8 hours.  ?Glucose, capillary     Status: Abnormal  ? Collection Time: 03/04/22  7:14 PM  ?Result Value Ref Range  ? Glucose-Capillary 113 (H) 70 - 99 mg/dL  ?  Comment: Glucose reference range applies only to samples taken after fasting for at least 8 hours.  ? Comment 1 Notify RN   ? Comment 2 Document in Chart   ?Glucose, capillary     Status: Abnormal  ? Collection Time: 03/04/22 11:27 PM  ?Result Value Ref Range  ? Glucose-Capillary 135 (H) 70 - 99 mg/dL  ?  Comment: Glucose reference range applies only to samples taken after fasting for at least 8 hours.  ? Comment 1 Notify RN   ? Comment 2 Document in Chart   ?Glucose, capillary     Status: None  ? Collection Time: 03/05/22  5:19 AM  ?Result Value Ref Range  ? Glucose-Capillary 93 70 - 99 mg/dL  ?  Comment: Glucose reference range applies only to samples taken after fasting for at least 8 hours.  ?Basic metabolic panel     Status: Abnormal  ? Collection Time: 03/05/22  5:29 AM  ?Result Value Ref Range  ? Sodium 133 (L) 135 - 145 mmol/L  ?  Potassium 3.1 (L) 3.5 - 5.1 mmol/L  ? Chloride 97 (L) 98 - 111 mmol/L  ? CO2 28 22 - 32 mmol/L  ? Glucose, Bld 98 70 - 99 mg/dL  ?  Comment: Glucose reference range applies only to samples taken after fasting

## 2022-03-05 NOTE — Progress Notes (Deleted)
RT was able to get 2 minutes into CPT and patient wanted it turned off. She said it was causing her leg to hurt. ?

## 2022-03-05 NOTE — Progress Notes (Signed)
[facility] ? ?Daily Progress Note ? ? ?Subjective  -  ? ?Improving. Still limited and a little tachypnic ? ?Objective ?Vitals:  ? 03/05/22 0830 03/05/22 0900 03/05/22 1000 03/05/22 1335  ?BP: (!) 159/59  (!) 150/52   ?Pulse: 81 87 76   ?Resp: (!) 23 (!) 42 (!) 31   ?Temp:    98.4 ?F (36.9 ?C)  ?TempSrc:    Axillary  ?SpO2: 100% 100% 100%   ?Weight:      ?Height:      ? ? ?Intake/Output Summary (Last 24 hours) at 03/05/2022 1402 ?Last data filed at 03/05/2022 1200 ?Gross per 24 hour  ?Intake 792.99 ml  ?Output 205 ml  ?Net 587.99 ml  ? ? ?PULM  CTAB ?CV  RRR ?VASC  Neck swelling down with minimal drainage. ? ?Laboratory ?CBC ?   ?Component Value Date/Time  ? WBC 14.5 (H) 03/05/2022 0529  ? HGB 9.6 (L) 03/05/2022 0529  ? HCT 28.5 (L) 03/05/2022 0529  ? PLT 531 (H) 03/05/2022 0529  ? ? ?BMET ?   ?Component Value Date/Time  ? NA 133 (L) 03/05/2022 0529  ? K 3.1 (L) 03/05/2022 0529  ? CL 97 (L) 03/05/2022 0529  ? CO2 28 03/05/2022 0529  ? GLUCOSE 98 03/05/2022 0529  ? BUN 16 03/05/2022 0529  ? CREATININE 0.39 (L) 03/05/2022 0529  ? CALCIUM 8.3 (L) 03/05/2022 0529  ? GFRNONAA >60 03/05/2022 0529  ? GFRAA >60 06/26/2019 0434  ? ? ?Assessment/Planning: ?POD #5 s/p left ICA stent, POD #9 after exploration for bleeding ? ?Treatment on the left, right sided stroke ?Moving all four ?Largely oriented ?Talking better ?PT/OT ?Speech reeval at some point ?Getting tube feeds now ?Please do ASA and Plavix as well as statin therapy now. No further bleeding ? ? ?Festus Barren ? ?03/05/2022, 2:02 PM ? ? ? ?  ?

## 2022-03-05 NOTE — Progress Notes (Signed)
? ?  Date of Admission:  02/24/2022    ?ID: Veronica Murray is a 66 y.o. female  ?Principal Problem: ?  Post-operative state ?Active Problems: ?  Rupture of carotid artery (HCC) ?  CVA (cerebral vascular accident) [facility]) ?  DM2 (diabetes mellitus, type 2) (HCC) ?  Hypokalemia ?  Dysphagia ?  Normocytic anemia ?  Leukocytosis ?  Thrombocytosis ?  Hyponatremia ? ? ? ?Subjective: ?Somnolent ?As per nurse she has been interactive the morning.  Oriented in place, person, year, some ? ?Medications:  ? aspirin  81 mg Per Tube Daily  ? Chlorhexidine Gluconate Cloth  6 each Topical Daily  ? clopidogrel  75 mg Per Tube Daily  ? feeding supplement (PROSource TF)  45 mL Per Tube BID  ? free water  30 mL Per Tube Q4H  ? insulin aspart  0-20 Units Subcutaneous Q4H  ? insulin aspart  3 Units Subcutaneous Q4H  ? mouth rinse  15 mL Mouth Rinse BID  ? metoCLOPramide (REGLAN) injection  5 mg Intravenous Q8H  ? pantoprazole sodium  40 mg Per Tube Daily  ? sodium chloride flush  10-40 mL Intracatheter Q12H  ? sodium chloride flush  3 mL Intravenous Q12H  ? ? ?Objective: ?Vital signs in last 24 hours: ?Temp:  [98.4 ?F (36.9 ?C)-98.6 ?F (37 ?C)] 98.4 ?F (36.9 ?C) (04/25 1335) ?Pulse Rate:  [76-91] 88 (04/25 1400) ?Resp:  [19-42] 22 (04/25 1400) ?BP: (115-161)/(52-111) 154/62 (04/25 1400) ?SpO2:  [88 %-100 %] 99 % (04/25 1400) ?Weight:  [77.4 kg] 77.4 kg (04/25 0500) ? ?PHYSICAL EXAM:  ?General: Somnolent ?Lungs:b/la ir entry. ?Heart: s1s2. ?Abdomen: Soft, non-tender,not distended. Bowel sounds normal. No masses ?Extremities: left PICc ?Skin: No rashes or lesions. Or bruising ?Lymph: Cervical, supraclavicular normal. ?Neurologic: cannot be assessed ? ?Lab Results ?Recent Labs  ?  03/04/22 ?6767 03/05/22 ?2094  ?WBC 9.5 14.5*  ?HGB 9.5* 9.6*  ?HCT 27.4* 28.5*  ?NA 133* 133*  ?K 3.9 3.1*  ?CL 96* 97*  ?CO2 30 28  ?BUN 14 16  ?CREATININE 0.34* 0.39*  ? ?Microbiology: ?Wound culture strep constellatus ?Blood culture  NG ? ? 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  ?Infected graft with blow out and hemorrhage ?Patch removal and repair on 4/17 ?Stent placement on 4/20 ?  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 ?Acute RT MCA infarct ?  ?Anemia has received PRBC multiple units ?  ?DM on insulin ?  ?Discussed the management with her nurse ? ID will follow her peripherally - call if needed ? ?

## 2022-03-05 NOTE — Progress Notes (Addendum)
?PROGRESS NOTE ? ? ?HPI was taken from Dr. Lorenso Courier: ?History of Present Illness: Patient s/p LEFT CEA on 01/25/2022 at our facility secondary to TIA and high grade stenosis. The patient had been well, presented for follow up without issue. Noted on 02/20/2022 she developed sudden swelling, pain and murky drainage from the incision. She was evaluated by urgent care on 02/22/2022 and it was felt she had a wound infection. She was treated with ABX. Last night the patient developed significant bleeding and hypotension to the 70s. Taken by EMS to [facility], digital pressure held, transfused pRBC. Neurologically intact. Initial [facility] by Forestine Na [facility] was [facility], with active bleeding. That facility and Vascular surgeon did not accept the patient. I accepted the patient for emergent intervention. Upon arrival EMS was holding pressure at neck incision, the patient was awake, alert and mentating and aware of situation. She Understood significant risks of bleeding, stroke and death. Consent obtained. ?  ?As per Dr. Mortimer Fries: ?02/25/22: Admit to ICU s/p left carotid artery repair in the setting of endarterectomy carotid rupture due to left carotid patch infection remained mechanically intubated postop ?02/28/22: Pt underwent left carotid artery stent placement  ?02/28/22: Will perform SBT with plans for possible extubation.  Pt unable to follow commands during [facility] CT Head revealed subacute right MCA stroke as well as recent but older-appearing left MCA strokes x2.  Neurology consulted.  Unable to extubate  ?4/23 MRI brain: Moderate-to-large acute/early subacute right MCA territory infarct within the cortical/subcortical right frontal lobe as well as right insula and subinsular region. Petechial hemorrhage at this site. Local mass effect without significant ventricular ?4/23 NEUROLOGY CONSULTED ? ? ?SHARIKA FARAONE  [id number] DOB: 1956-05-27 DOA:  02/24/2022 ?PCP: Patient, No Pcp Per (Inactive) ? ? ?Assessment & Plan: ?  ?Principal Problem: ?  Post-operative state ?Active Problems: ?  Rupture of carotid artery ([place]) ?  CVA (cerebral vascular accident) [facility]) ?  DM2 (diabetes mellitus, type 2) ([facility]) ?  Hypokalemia ?  Dysphagia ?  Normocytic anemia ?  Leukocytosis ?  Thrombocytosis ?  Hyponatremia ? ?Assessment and Plan: ? ?Acute hypoxic & hypercapnic respiratory failure: s/p intubation, ventilation & extubation. Continue on supplemental oxygen and wean as tolerated ? ?Rupture left carotid: from an abscess w/ tracheal deviation, at previous endarterectomy surgical site s/p repair. Continue on IV unasyn. Management as per vasc surg  ? ?Hyponatremia: stable from day prior. Will continue to monitor  ? ?CVA: likely etiology on encephalopathy. MRI brain 4/22 shows multiple small acute/early subacute cortical & subcortical infarcts w/ petechial hemorrhage associated w/ some of these infarcts. Chronic infarcts also noted. Hold aspirin, plavix until vas surg amenable as per neuro. Echo shows EF 60-65%, no regional wall motion abnormalities, diastolic function is normal, & atrial septum is grossly normal. Neuro recs apprec  ? ?DM2: poorly controlled. Continue on levemir, SSI w/ accuchecks ? ?Hypokalemia: KCl repleated ? ?Dysphagia: likely secondary to CVA. NPO. S/p NG tube placed & started on tube feeds today. Speech following  ? ?Leukocytosis: secondary to above.  ? ?Normocytic anemia: H&H are stable. Will transfuse if Hb < 7.0 ? ?Thrombocytosis: etiology unclear, labile. Will continue to monitor  ? ? ? ? ?DVT prophylaxis: SCDs ?Code Status:  full  ?Family Communication:  ?Disposition Plan: PT recs SNF  ? ?Level of care: Stepdown ? ?Status is: Inpatient ?Remains inpatient appropriate because: severity of illness ? ? ? ?Consultants:  ?  ICU ?Vasc surg  ?Neuro  ? ?Procedures: ? ?Antimicrobials: unasyn  ? ?Subjective: ?Pt is lethargic  ? ?Objective: ?Vitals:  ? 03/05/22 1000  03/05/22 1200 03/05/22 1335 03/05/22 1400  ?BP: (!) 150/52 (!) 148/80  (!) 154/62  ?Pulse: 76 83  88  ?Resp: (!) 31 (!) 29  (!) 22  ?Temp:   98.4 ?F (36.9 ?C)   ?TempSrc:   Axillary   ?SpO2: 100% 99%  99%  ?Weight:      ?Height:      ? ? ?Intake/Output Summary (Last 24 hours) at 03/05/2022 1511 ?Last data filed at 03/05/2022 1500 ?Gross per 24 hour  ?Intake 864.32 ml  ?Output 556 ml  ?Net 308.32 ml  ? ?Filed Weights  ? 03/03/22 0500 03/04/22 0458 03/05/22 0500  ?Weight: 84.8 kg 82.9 kg 77.4 kg  ? ? ?Examination: ? ?General exam: Appears calm and comfortable  ?Respiratory system: diminished breath sounds b/l ?Cardiovascular system: S1 & S2 +. No  rubs, gallops or clicks.  ?Gastrointestinal system: Abdomen is nondistended, soft and nontender. Normal bowel sounds heard. ?Central nervous system: Lethargic. Would intermittently follow simple commands ?Psychiatry: Judgement and insight appears not at baseline. Flat mood and affect ? ? ? ?Data Reviewed: I have personally reviewed following labs and imaging studies ? ?CBC: ?Recent Labs  ?Lab 03/01/22 ?0427 03/02/22 ?0429 03/03/22 ?0414 03/04/22 ?1884 03/05/22 ?1660  ?WBC 9.5 8.2 12.0* 9.5 14.5*  ?HGB 9.2* 9.4* 10.2* 9.5* 9.6*  ?HCT 28.6* 27.9* 29.7* 27.4* 28.5*  ?MCV 92.0 87.5 86.8 87.0 88.0  ?PLT 260 322 431* 418* 531*  ? ?Basic Metabolic Panel: ?Recent Labs  ?Lab 03/01/22 ?0427 03/02/22 ?0429 03/03/22 ?0414 03/03/22 ?1348 03/03/22 ?2040 03/04/22 ?6301 03/05/22 ?6010  ?NA 134* 133* 133* 130*  --  133* 133*  ?K 5.1 3.6 2.7* 3.1* 3.5 3.9 3.1*  ?CL 100 96* 93* 93*  --  96* 97*  ?CO2 29 31 34* 32  --  30 28  ?GLUCOSE 201* 177* 129* 81  --  175* 98  ?BUN 16 15 12 10   --  14 16  ?CREATININE 0.44 0.38* 0.35* 0.39*  --  0.34* 0.39*  ?CALCIUM 7.8* 8.3* 8.3* 8.3*  --  8.5* 8.3*  ?MG 2.1 1.8 1.8  --   --  2.2 2.0  ?PHOS 3.5 3.8 4.0 3.6  --  4.3 3.9  ? ?GFR: ?Estimated Creatinine Clearance: 69.3 mL/min (A) (by C-G formula based on SCr of 0.39 mg/dL (L)). ?Liver Function Tests: ?Recent  Labs  ?Lab 03/03/22 ?1348  ?ALBUMIN 2.4*  ? ?No results for input(s): LIPASE, AMYLASE in the last 168 hours. ?No results for input(s): AMMONIA in the last 168 hours. ?Coagulation Profile: ?No results for input(s): INR, PROTIME in the last 168 hours. ?Cardiac Enzymes: ?No results for input(s): CKTOTAL, CKMB, CKMBINDEX, TROPONINI in the last 168 hours. ?BNP (last 3 results) ?No results for input(s): PROBNP in the last 8760 hours. ?HbA1C: ?No results for input(s): HGBA1C in the last 72 hours. ?CBG: ?Recent Labs  ?Lab 03/04/22 ?1914 03/04/22 ?2327 03/05/22 ?9323 03/05/22 ?0747 03/05/22 ?1112  ?GLUCAP 113* 135* 93 103* 116*  ? ?Lipid Profile: ?No results for input(s): CHOL, HDL, LDLCALC, TRIG, CHOLHDL, LDLDIRECT in the last 72 hours. ?Thyroid Function Tests: ?No results for input(s): TSH, T4TOTAL, FREET4, T3FREE, THYROIDAB in the last 72 hours. ?Anemia Panel: ?No results for input(s): VITAMINB12, FOLATE, FERRITIN, TIBC, IRON, RETICCTPCT in the last 72 hours. ?Sepsis Labs: ?Recent Labs  ?Lab 02/27/22 ?0427  ?PROCALCITON <0.10  ? ? ?  Recent Results (from the past 240 hour(s))  ?Aerobic/Anaerobic Culture w Gram Stain (surgical/deep wound)     Status: None  ? Collection Time: 02/25/22  2:45 AM  ? Specimen: PATH Other; Tissue  ?Result Value Ref Range Status  ? Specimen Description   Final  ?  ARTERY LEFT CAROTID ARTERY PATCH ?Performed at [facility], [address] [place], [place] [address] ?  ? Special Requests   Final  ?  NONE ?Performed at [facility], [address], [place], [place] [address] ?  ? Gram Stain NO WBC SEEN ?NO ORGANISMS SEEN ?  Final  ? Culture   Final  ?  FEW STREPTOCOCCUS CONSTELLATUS ?CRITICAL RESULT CALLED TO, READ BACK BY AND VERIFIED WITH: RN [email or web address].L AT 1233 ON 02/27/2022 BY T.SAAD ?NO ANAEROBES ISOLATED ?Performed at [facility], [address] [place], [place] [address] ?  ? Report Status 03/02/2022 FINAL  Final  ? Organism ID, Bacteria STREPTOCOCCUS CONSTELLATUS   Final  ?    Susceptibility  ? Streptococcus constellatus - MIC*  ?  PENICILLIN <=0.06 SENSITIVE Sensitive   ?  CEFTRIAXONE 0.25 SENSITIVE Sensitive   ?  ERYTHROMYCIN >=8 RESISTANT Resistant   ?  LEVOFLO

## 2022-03-06 ENCOUNTER — Encounter (INDEPENDENT_AMBULATORY_CARE_PROVIDER_SITE_OTHER): Payer: Medicare HMO

## 2022-03-06 ENCOUNTER — Ambulatory Visit (INDEPENDENT_AMBULATORY_CARE_PROVIDER_SITE_OTHER): Payer: Medicare HMO | Admitting: Nurse Practitioner

## 2022-03-06 ENCOUNTER — Inpatient Hospital Stay: Payer: Medicare HMO

## 2022-03-06 DIAGNOSIS — Z9889 Other specified postprocedural states: Secondary | ICD-10-CM | POA: Diagnosis not present

## 2022-03-06 DIAGNOSIS — M25559 Pain in unspecified hip: Secondary | ICD-10-CM

## 2022-03-06 LAB — GLUCOSE, CAPILLARY
Glucose-Capillary: 110 mg/dL — ABNORMAL HIGH (ref 70–99)
Glucose-Capillary: 134 mg/dL — ABNORMAL HIGH (ref 70–99)
Glucose-Capillary: 137 mg/dL — ABNORMAL HIGH (ref 70–99)
Glucose-Capillary: 137 mg/dL — ABNORMAL HIGH (ref 70–99)
Glucose-Capillary: 138 mg/dL — ABNORMAL HIGH (ref 70–99)
Glucose-Capillary: 147 mg/dL — ABNORMAL HIGH (ref 70–99)
Glucose-Capillary: 158 mg/dL — ABNORMAL HIGH (ref 70–99)

## 2022-03-06 LAB — BASIC METABOLIC PANEL
Anion gap: 11 (ref 5–15)
BUN: 13 mg/dL (ref 8–23)
CO2: 30 mmol/L (ref 22–32)
Calcium: 8.5 mg/dL — ABNORMAL LOW (ref 8.9–10.3)
Chloride: 94 mmol/L — ABNORMAL LOW (ref 98–111)
Creatinine, Ser: 0.41 mg/dL — ABNORMAL LOW (ref 0.44–1.00)
GFR, Estimated: >60 mL/min (ref >60)
Glucose, Bld: 139 mg/dL — ABNORMAL HIGH (ref 70–99)
Potassium: 3.5 mmol/L (ref 3.5–5.1)
Sodium: 135 mmol/L (ref 135–145)

## 2022-03-06 LAB — CBC
HCT: 29.6 % — ABNORMAL LOW (ref 36.0–46.0)
Hemoglobin: 9.9 g/dL — ABNORMAL LOW (ref 12.0–15.0)
MCH: 29.4 pg (ref 26.0–34.0)
MCHC: 33.4 g/dL (ref 30.0–36.0)
MCV: 87.8 fL (ref 80.0–100.0)
Platelets: 604 10*3/uL — ABNORMAL HIGH (ref 150–400)
RBC: 3.37 MIL/uL — ABNORMAL LOW (ref 3.87–5.11)
RDW: 12.5 % (ref 11.5–15.5)
WBC: 10.1 10*3/uL (ref 4.0–10.5)
nRBC: 0 % (ref 0.0–0.2)

## 2022-03-06 LAB — PHOSPHORUS: Phosphorus: 3 mg/dL (ref 2.5–4.6)

## 2022-03-06 LAB — MAGNESIUM: Magnesium: 1.9 mg/dL (ref 1.7–2.4)

## 2022-03-06 IMAGING — DX DG PORTABLE PELVIS
1 series · 1 of 1 positions shown · non-contrast
Comparison: Abdominal radiographs done on [DATE]

CLINICAL DATA: Pain both hips

EXAM:
PORTABLE PELVIS 1 VIEW

[pelvis ap]
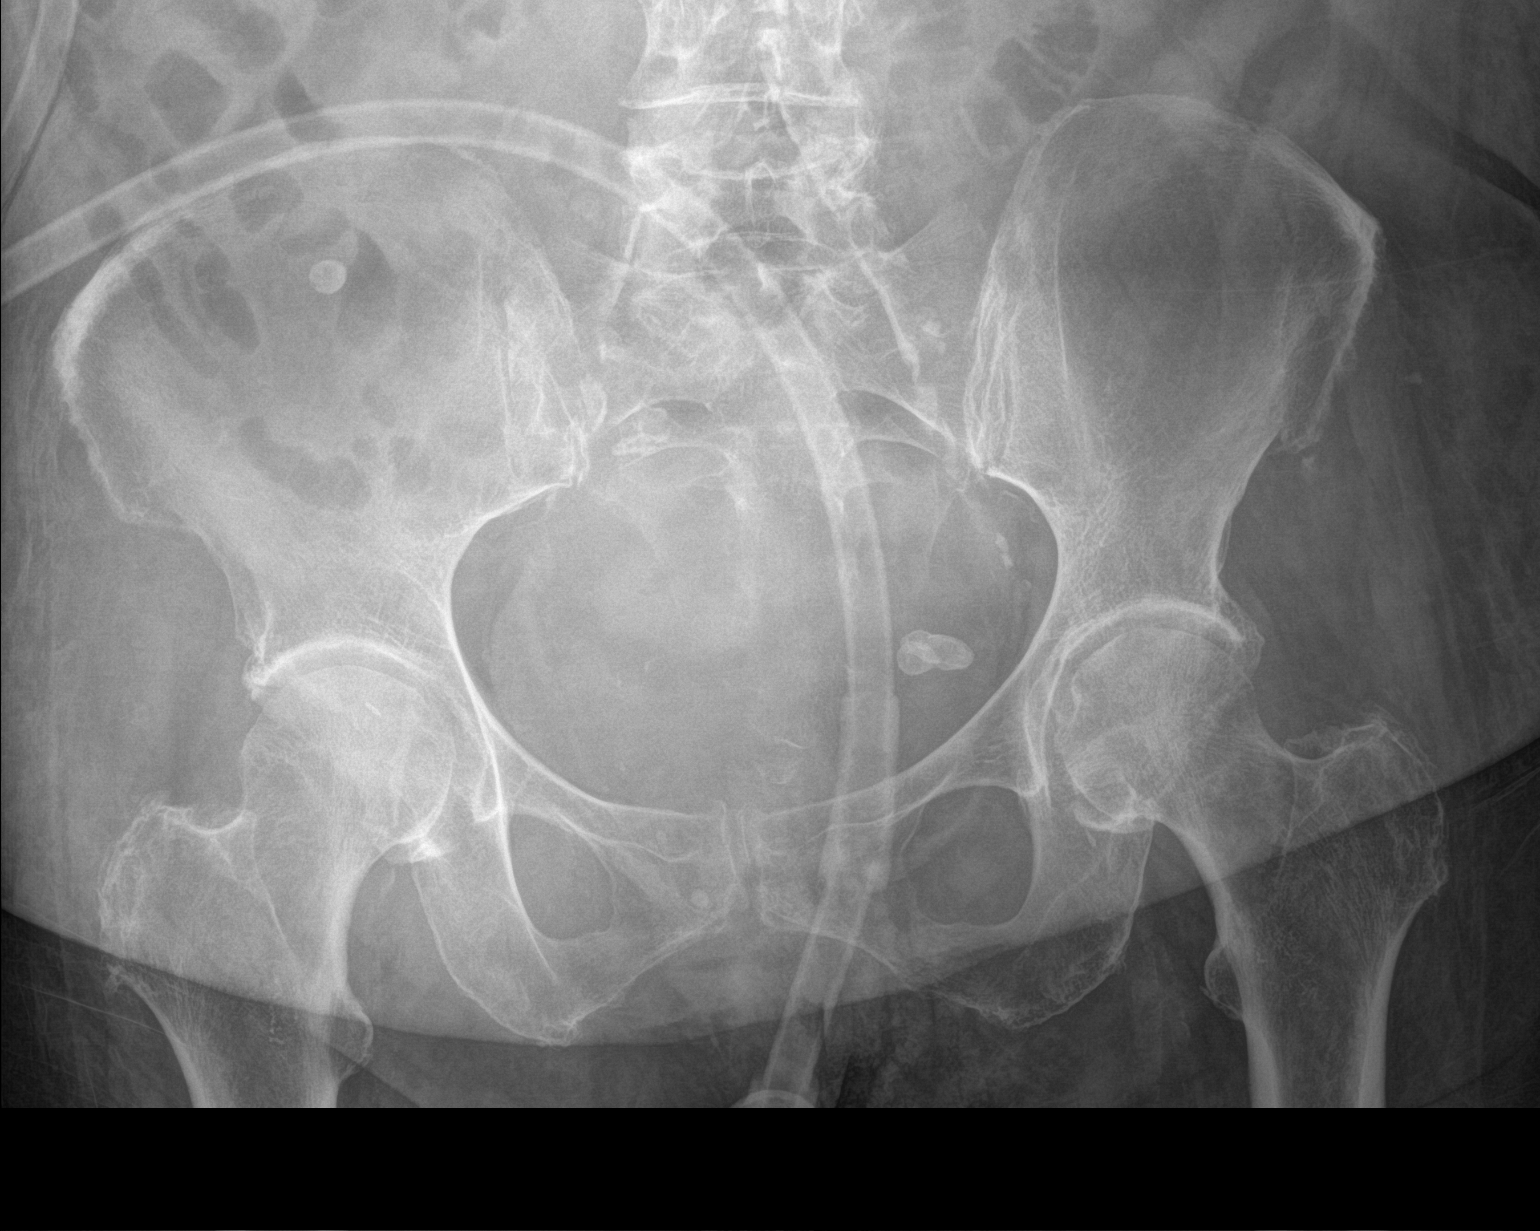

[1 of 1 positions shown; findings below may reference images not displayed]

FINDINGS: No recent fracture is seen. There is no dislocation. Small bony
spurs seen in both hips. Vascular calcifications are seen in the
soft tissues.
IMPRESSION: No recent fracture or dislocation is seen. Small bony spurs seen in
both hips.

## 2022-03-06 MED ORDER — POTASSIUM CHLORIDE 10 MEQ/50ML IV SOLN
10.0000 meq | INTRAVENOUS | Status: AC
  Administered 2022-03-06 (×2): 10 meq via INTRAVENOUS
  Filled 2022-03-06 (×2): qty 50

## 2022-03-06 MED ORDER — DEXTROSE-NACL 5-0.9 % IV SOLN: INTRAVENOUS | Status: DC

## 2022-03-06 MED ORDER — ENOXAPARIN SODIUM 40 MG/0.4ML IJ SOSY
0.5000 mg/kg | PREFILLED_SYRINGE | INTRAMUSCULAR | Status: DC
Start: 2022-03-06 — End: 2022-03-15
  Administered 2022-03-06 – 2022-03-14 (×9): 37.5 mg via SUBCUTANEOUS
  Filled 2022-03-06 (×10): qty 0.4

## 2022-03-06 MED ORDER — MAGNESIUM SULFATE IN D5W 1-5 GM/100ML-% IV SOLN
1.0000 g | Freq: Once | INTRAVENOUS | Status: AC
  Administered 2022-03-06: 1 g via INTRAVENOUS
  Filled 2022-03-06: qty 100

## 2022-03-06 MED ORDER — ROSUVASTATIN CALCIUM 10 MG PO TABS
10.0000 mg | ORAL_TABLET | Freq: Every day | ORAL | Status: DC
  Administered 2022-03-10 – 2022-03-15 (×6): 10 mg via ORAL
  Filled 2022-03-06 (×6): qty 1

## 2022-03-06 MED ORDER — DEXTROSE-NACL 5-0.45 % IV SOLN: INTRAVENOUS | Status: DC

## 2022-03-06 NOTE — Progress Notes (Signed)
Physical Therapy Treatment ?Patient Details ?Name: Veronica Murray ?MRN: [id number] ?DOB: [birth year] ?Today's Date: 03/06/2022 ? ? ?History of Present Illness 66 yo F transferred emergently to [facility] ED for emergent vascular intervention on a ruptured carotid endarterectomy wound. Intubated on 4/16, extubated on 4/24. 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 Petechial hemorrhage at this site."  Hospital course significant for L neck exploration with surture repair to L carotid (02/24/22) and placement of embolic protective device L carotid (02/28/22) ? ?  ?PT Comments  ? ? Patient alert, soft spoken throughout session but able to express herself several times. Pt denied any pain. Seen as a co-treat to maximize function and safety. She was able to move all her extremities on command today, and with simple, one step commands able to follow consistently as long as she was not distracted. Bed mobility with min-modAx2 to come to EOB, and was able to participate in several seated reaching and righting reactions, CGA-modA throughout. Lateral scoot to recliner with maxAx2, pt able to initiate forward lean and reach for recliner arm. Also able to perform bilateral LAQ with constant one step commands, visual cueing. The patient would benefit from further skilled PT intervention to continue to progress towards goals. Recommendation remains appropriate.  ?  ?Recommendations for follow up therapy are one component of a multi-disciplinary discharge planning process, led by the attending physician.  Recommendations may be updated based on patient status, additional functional criteria and insurance authorization. ? ?Follow Up Recommendations ? Skilled nursing-short term rehab (<3 hours/day) ?  ?  ?Assistance Recommended at Discharge Frequent or constant Supervision/Assistance  ?Patient  can return home with the following Two people to help with walking and/or transfers;Two people to help with bathing/dressing/bathroom;Help with stairs or ramp for entrance ?  ?Equipment Recommendations ? Other (comment) (TBD)  ?  ?Recommendations for Other Services   ? ? ?  ?Precautions / Restrictions Precautions ?Precautions: Fall ?Restrictions ?Weight Bearing Restrictions: No  ?  ? ?Mobility ? Bed Mobility ?Overal bed mobility: Needs Assistance ?Bed Mobility: Supine to Sit ?  ?  ?Supine to sit: Min assist, Mod assist, +2 for physical assistance ?  ?  ?General bed mobility comments: VC for sequencing ?  ? ?Transfers ?Overall transfer level: Needs assistance ?  ?Transfers: Bed to chair/wheelchair/BSC ?  ?  ?  ?  ?  ? Lateral/Scoot Transfers: Max assist, +2 physical assistance ?General transfer comment: VC for sequencing, pt able to initiate anterior weight shift prior to scoot into recliner and reach for recliner arm ?  ? ?Ambulation/Gait ?  ?  ?  ?  ?  ?  ?  ?  ? ? ?Stairs ?  ?  ?  ?  ?  ? ? ?Wheelchair Mobility ?  ? ?Modified Rankin (Stroke Patients Only) ?  ? ? ?  ?Balance Overall balance assessment: Needs assistance ?Sitting-balance support: No upper extremity supported, Feet supported, Single extremity supported ?Sitting balance-Leahy Scale: Poor ?Sitting balance - Comments: Intermittent SBA to MOD A to correct for LOB posteriorly > L lateral, loses balance more easily with distractions, multimodal cues to correct and varying levels of assist ?Postural control: Left lateral lean, Posterior lean ?  ?  ?  ?  ?  ?  ?  ?  ?  ?  ?  ?  ?  ?  ?  ? ?  ?Cognition Arousal/Alertness: Awake/alert ?Behavior  During Therapy: Flat affect, Restless ?Overall Cognitive Status: Impaired/Different from baseline ?Area of Impairment: Orientation, Attention, Memory, Following commands, Safety/judgement, Awareness, Problem solving ?  ?  ?  ?  ?  ?  ?  ?  ?Orientation Level: Person, Place ?Current Attention Level: Focused ?Memory:  Decreased short-term memory ?Following Commands: Follows one step commands with increased time ?Safety/Judgement: Decreased awareness of deficits ?  ?Problem Solving: Slow processing, Decreased initiation, Difficulty sequencing, Requires verbal cues, Requires tactile cues ?General Comments: L inattention remains although slightly better than previousSlightly improved vocal quality/volume today. Easily distracted causing LOB. ?  ?  ? ?  ?Exercises   ? ?  ?General Comments   ?  ?  ? ?Pertinent Vitals/Pain Pain Assessment ?Pain Assessment: Faces ?Faces Pain Scale: No hurt  ? ? ?Home Living   ?  ?  ?  ?  ?  ?  ?  ?  ?  ?   ?  ?Prior Function    ?  ?  ?   ? ?PT Goals (current goals can now be found in the care plan section) Progress towards PT goals: Progressing toward goals ? ?  ?Frequency ? ? ? 7X/week ? ? ? ?  ?PT Plan Current plan remains appropriate  ? ? ?Co-evaluation   ?Reason for Co-Treatment: Complexity of the patient's impairments (multi-system involvement);Necessary to address cognition/behavior during functional activity;For patient/therapist safety;To address functional/ADL transfers ?PT goals addressed during session: Mobility/safety with mobility;Balance ?OT goals addressed during session: ADL's and self-care (balance attention) ?  ? ?  ?AM-PAC PT "6 Clicks" Mobility   ?Outcome Measure ? Help needed turning from your back to your side while in a flat bed without using bedrails?: A Lot ?Help needed moving from lying on your back to sitting on the side of a flat bed without using bedrails?: A Lot ?Help needed moving to and from a bed to a chair (including a wheelchair)?: Total ?Help needed standing up from a chair using your arms (e.g., wheelchair or bedside chair)?: Total ?Help needed to walk in hospital room?: Total ?Help needed climbing 3-5 steps with a railing? : Total ?6 Click Score: 8 ? ?  ?End of Session Equipment Utilized During Treatment: Oxygen ?Activity Tolerance: Patient tolerated treatment  well ?Patient left: in chair;with chair alarm set;with call bell/phone within reach;with nursing/sitter in room ?Nurse Communication: Mobility status ?PT Visit Diagnosis: Muscle weakness (generalized) (M62.81);Difficulty in walking, not elsewhere classified (R26.2);Hemiplegia and hemiparesis ?Hemiplegia - Right/Left: Left ?Hemiplegia - dominant/non-dominant: Non-dominant ?Hemiplegia - caused by: Cerebral infarction ?  ? ? ?Time: 3235-5732 ?PT Time Calculation (min) (ACUTE ONLY): 31 min ? ?Charges:  $Therapeutic Activity: 8-22 mins          ?          ? ?Olga Coaster PT, DPT ?1:08 PM,03/06/22 ? ?

## 2022-03-06 NOTE — Progress Notes (Signed)
?  Chaplain On-Call responded to referral from Chaplain Alena Bills, and also discovered Spiritual Care Consult Order. ? ?Bishop Limbo had worked yesterday with the patient, who desires to complete Advance Directives documents. ? ?Prudence Davidson attempted to visit with the patient. ?However, the patient made no responses to my questions. ?Nursing Staff stated that the patient is distracted easily. ? ?Chaplain will refer to Afternoon Chaplain for follow-up. ? ?Chaplain Evelena Peat ?M.Div., [facility] ?

## 2022-03-06 NOTE — Progress Notes (Signed)
?   03/06/22 1300  ?Clinical Encounter Type  ?Visited With Patient;Health care provider  ?Visit Type Initial  ?Referral From Nurse  ? ?Chaplain responded to consult for Advance Directive. After checking in on patient and consulting with nurse it was decided to check in again the next day. ?

## 2022-03-06 NOTE — Progress Notes (Signed)
[facility] ? ?Daily Progress Note ? ? ?Subjective  -  ? ?Patient sitting up in a chair.  NG tube is out.  Voice still very quiet and difficult to understand but is making good sense and does have good orientation ? ?Objective ?Vitals:  ? 03/06/22 0500 03/06/22 0600 03/06/22 0800 03/06/22 1210  ?BP:  (!) 156/69 (!) 163/57 (!) 159/70  ?Pulse: 85 91 98 87  ?Resp: (!) 29 (!) 29 (!) 39 (!) 26  ?Temp:   97.6 ?F (36.4 ?C) 98 ?F (36.7 ?C)  ?TempSrc:   Oral Oral  ?SpO2: 99% 95% 96% 94%  ?Weight: 75.7 kg     ?Height:      ? ? ?Intake/Output Summary (Last 24 hours) at 03/06/2022 1306 ?Last data filed at 03/06/2022 3976 ?Gross per 24 hour  ?Intake 294.7 ml  ?Output 2626 ml  ?Net -2331.3 ml  ? ? ?PULM  a little coarse and tachypneic ?CV  RRR ?VASC  neck with minimal swelling and no further bleeding ? ?Laboratory ?CBC ?   ?Component Value Date/Time  ? WBC 10.1 03/06/2022 0549  ? HGB 9.9 (L) 03/06/2022 0549  ? HCT 29.6 (L) 03/06/2022 0549  ? PLT 604 (H) 03/06/2022 0549  ? ? ?BMET ?   ?Component Value Date/Time  ? NA 135 03/06/2022 0549  ? K 3.5 03/06/2022 0549  ? CL 94 (L) 03/06/2022 0549  ? CO2 30 03/06/2022 0549  ? GLUCOSE 139 (H) 03/06/2022 0549  ? BUN 13 03/06/2022 0549  ? CREATININE 0.41 (L) 03/06/2022 0549  ? CALCIUM 8.5 (L) 03/06/2022 0549  ? GFRNONAA >60 03/06/2022 0549  ? GFRAA >60 06/26/2019 0434  ? ? ?Assessment/Planning: ?POD #6 s/p left carotid covered stent and POD #10 s/p left carotid exploration for bleeding ? ?No further bleeding and I am okay with anticoagulation at this point ?Right hemispheric stroke.  Steadily improving.  Continue PT and OT. ?Likely okay to go to the floor at this point ?Should be on dual antiplatelet therapy with aspirin and Plavix as well as a statin agent going forward ? ? ?Veronica Murray ? ?03/06/2022, 1:06 PM ? ? ? ?  ?

## 2022-03-06 NOTE — TOC CM/SW Note (Signed)
RE: Veronica Murray ?Date of Birth: 07/02/1956 ?Date: 03/06/2022 ? ? ?To Whom It May Concern: ? ?Please be advised that the above-named patient will require a short-term nursing home stay - anticipated 30 days or less for rehabilitation and strengthening.  The plan is for return home. ?

## 2022-03-06 NOTE — Progress Notes (Signed)
Speech Language Pathology Treatment: Dysphagia  ?Patient Details ?Name: Veronica Murray ?MRN: [id number] ?DOB: 1956/05/02 ?Today's Date: 03/06/2022 ?Time: [id number] ?SLP Time Calculation (min) (ACUTE ONLY): 60 min ? ?Assessment / Plan / Recommendation ?Clinical Impression ? Pt seen for ongoing clinical swallowing assessment; appropriateness for oral intake. Pt alert, sitting in chair but sliding down and w/ no self-correction attempts. Pt recently pulled out her NGT per NSG. Pt seemed to exhibit reduced awareness of herself sliding down/out of chair as well as during po tasks. Sat on pt's Left side to engage Left side awareness; min R gaze preference noted.  Flat affect. Verbalizations semi-attempted x2 when presented a cup of water; wet vocal quality appreciated. Pt attempted self-feeding using both R/LUEs. ?Respirations improved at 23bpm; on 2-5L/min O2 via [place].  ? ?Pt frequently coughed(upper airway congested cough) and required support to clear oropharyngeal secretions -- she often expectorated secretions into the towel which she held near her mouth or used the yaunker. Reduced oral secretion management (swallowing and clearing) is concerning for safe management of any oral intake.  ?  ?Moderate+ Left labial weakness and significant oral/lingual weakness appreciated during oral motor examination -- pt exhibited poor follow through w/ OM tasks to fully assess. Reduced Left orofacial/labial sensation appreciated. However, pt exhibited a Gag Reflux to stim w/ tongue blade. Pt with sparse condition in poor condition.  ?  ?Post oral care which pt held swabs and engaged w/ light "brushing" of teeth(further oral care delivered by this Clinician), pt was given trials of Single ice chips x10; 2 trials of puree also. Pt exhibited s/s of oral phase dysphagia c/b oral holding, pocketing in Left lateral sulcus w/ some eventual anterior loss on Left side, diffuse oral/lingual residue w/ puree trials. Pt demonstrated fair labial  closure and oral-lingual movements and bolus manipulation given cues to focus/attend to po tasks. Noted lack of attention and reduced labial closure on cup rim w/ 2 trials of thin liquid via cup(water) -- pt held and brought to her mouth. ?OF NOTE: she attempted to feed self an ice chip holding the spoon when the the washcloth was in front of her mouth thus pushing the washcloth into her mouth too. She did not self-correct the situation so this Clinician intervened.  ?Pharyngeal swallowing was appreciated w/ trials of ice chips -- strongly suspect delayed swallow initiation. Deficits c/b audible swallows, multiple swallows, disorganized transfer and swallowing, AND MOST NOTED was a tongue forward position during the swallows w/ tongue forward at rest posture. This is a Neurological sign of Dysphagia. Coughing occurred intermittently during all consistencies trialed: ice chips x4/10; thin liquids x1/2; puree x1/2. Noted fluctuations of O2 sats into the low 90s-90% x2; rest breaks given. RR and HR remained at her baseline.  ?Additional trials were suspended d/t pt's clinical presentation. F/u oral care for stim of swallowing and hygiene performed at end of session.  ?  ?In setting of pt's current presentation, a safe oral diet cannot be recommended. Recommend continue NPO status w/ consideration for alternative means of feeding to include PEG, NGT for nutrition/hydration/medication, if in alignment with GOC.  ?  ?Pt is at increased risk for aspiration/aspiration pneumonia given degree of CVA/infarcts (see MRI), mental status, dental status, recent/prolonged intubation, reduced secretion management, and aphonic vocal quality.   ? ?ST services to continue to follow for Dysphagia tx per POC for ongoing clinical swallowing assessment in hopes to improve oropharyngeal swallowing w/ goal to achieve an oral diet again. Pt will also benefit  from cognitive-linguistic evaluation d/t recent CVAs when appropriate.  ?The above was  discussed w/ MD/NSG/Dietician who agreed.  ? ? ? ?  ?HPI HPI: Per 29 H&P "Patient s/p LEFT CEA on 01/25/2022 at our facility secondary to TIA and high grade stenosis. The patient had been well, presented for follow up without issue. Noted on 02/20/2022 she developed sudden swelling, pain and murky drainage from the incision. She was evaluated by urgent care on 02/22/2022 and it was felt she had a wound infection. She was treated with ABX. Last night the patient developed significant bleeding and hypotension to the 70s. Taken by EMS to [facility], digital pressure held, transfused pRBC. Neurologically intact. [facility], with active bleeding. That facility and Vascular surgeon did not accept the patient. I accepted the patient for emergent intervention. Upon arrival EMS was holding pressure at neck incision, the patient was awake, alert and mentating and aware of situation. She Understood significant risks of bleeding, stroke and death. Consent obtained.".     CXR on 03/03/2022: while still orally intubated, No acute cardiopulmonary disease identified.  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 Petechial hemorrhage  at this site. Local mass effect without significant ventricular  effacement. No midline shift.     Multiple smaller acute/early subacute cortical and subcortical  infarcts within the left frontoparietal lobes, posterior left  temporal lobe and lateral left occipital lobe (left MCA vascular  territory, as well as left MCA/ACA and left MCA/PCA watershed  territories). Petechial hemorrhage associated with some of these  infarcts. These infarcts are superimposed upon chronic infarcts  within the left MCA vascular territory and left watershed  territory. ?  ?   ?SLP Plan ? Continue with current plan of care ? ?  ?  ?Recommendations for follow up therapy are one component of a multi-disciplinary discharge planning process, led by the attending physician.  Recommendations may be updated based on patient status, additional functional criteria and insurance authorization. ?  ? ?Recommendations  ?Diet recommendations: NPO (w/ consideration of alternative means of feeding(PEG vs NGT) - pt has pulled out NGT already per NSG/MD) ?Medication Administration: Via alternative means  ?   ?    ?   ? ? ? ? General recommendations:  (Dietician f/u; Palliative Care f/u) ?Oral Care Recommendations: Oral care QID;Staff/trained caregiver to provide oral care (also w/ ST tx sessions prior to any ice chip trials) ?Follow Up Recommendations: Skilled nursing-short term rehab (<3 hours/day) (TBD) ?Assistance recommended at discharge: Frequent or constant Supervision/Assistance ?SLP Visit Diagnosis: Dysphagia, oropharyngeal phase (R13.12) (CVA) ?Plan: Continue with current plan of care ? ? ? ? ?  ?  ? ? ? ? ?Orinda Kenner, MS, CCC-SLP ?Speech Language Pathologist ?Rehab Services; [place] ?[phone] (ascom) ?Lovene Maret ? ?03/06/2022, 6:05 PM ?

## 2022-03-06 NOTE — Progress Notes (Signed)
Occupational Therapy Treatment ?Patient Details ?Name: Veronica HeimlichMargaret M Murray ?MRN: [id number] ?DOB: Mar 17, 1956 ?Today's Date: 03/06/2022 ? ? ?History of present illness 66 yo F [facility] for emergent vascular intervention on a ruptured carotid endarterectomy wound. Intubated on 4/16, extubated on 4/24. 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 Petechial hemorrhage at this site."  Hospital course significant for L neck exploration with surture repair to L carotid (02/24/22) and placement of embolic protective device L carotid (02/28/22) ?  ?OT comments ? Pt seen for OT and PT co-tx this date to address balance and ADL transfers. Pt alert, demonstrating slight improvement in voice quality/volume, and motivated to participate. Pt required MIN-MOD A +2 for bed mobility with VC for sequencing and L inattention. Once sitting EOB, pt required intermittent physical assist to maintain balance during dynamic balance activities with L vs R vs bilat UE involvement. Pt continues to be easily distractible, requiring decreased environmental distractions. Pt required MAX A +2 for lateral scoot into recliner but was able to initiate an anterior weight shift just prior to scoot. Pt appreciative and states "nothing can stop my now." Pt continues to benefit from high intensity skilled OT services to maximize return to PLOF.  ? ?Recommendations for follow up therapy are one component of a multi-disciplinary discharge planning process, led by the attending physician.  Recommendations may be updated based on patient status, additional functional criteria and insurance authorization. ?   ?Follow Up Recommendations ? Skilled nursing-short term rehab (<3 hours/day)  ?  ?Assistance Recommended at Discharge Frequent or constant Supervision/Assistance  ?Patient can return home with  the following ? Two people to help with bathing/dressing/bathroom;Two people to help with walking and/or transfers;Assistance with cooking/housework;Assist for transportation;Direct supervision/assist for medications management;Direct supervision/assist for financial management;Help with stairs or ramp for entrance ?  ?Equipment Recommendations ? Other (comment) (TBD next venue)  ?  ?Recommendations for Other Services   ? ?  ?Precautions / Restrictions Precautions ?Precautions: Fall ?Restrictions ?Weight Bearing Restrictions: No  ? ? ?  ? ?Mobility Bed Mobility ?Overal bed mobility: Needs Assistance ?Bed Mobility: Supine to Sit ?  ?  ?Supine to sit: Min assist, Mod assist, +2 for physical assistance ?  ?  ?General bed mobility comments: VC for sequencing ?  ? ?Transfers ?Overall transfer level: Needs assistance ?  ?Transfers: Bed to chair/wheelchair/BSC ?  ?  ?  ?  ?  ? Lateral/Scoot Transfers: Max assist, +2 physical assistance ?General transfer comment: VC for sequencing, pt able to initiate anterior weight shift prior to scoot into recliner ?  ?  ?Balance Overall balance assessment: Needs assistance ?Sitting-balance support: No upper extremity supported, Feet supported, Single extremity supported ?Sitting balance-Leahy Scale: Poor ?Sitting balance - Comments: Intermittent SBA to MOD A to correct for LOB posteriorly > L lateral, loses balance more easily with distractions, multimodal cues to correct and varying levels of assist ?Postural control: Left lateral lean, Posterior lean ?  ?  ?  ?  ?  ?  ?  ?  ?  ?  ?  ?  ?  ?  ?   ? ?ADL either performed or assessed with clinical judgement  ? ?ADL Overall ADL's : Needs assistance/impaired ?  ?  ?Grooming: Sitting;Set up;Wash/dry face;Supervision/safety ?Grooming Details (indicate cue type and reason): With back support sitting in recliner, OT handed washcloth to pt and with VC to wash  her face, she was able to complete with BUE ?  ?  ?  ?  ?  ?  ?  ?  ?  ?  ?  ?  ?  ?   ?  ?  ?  ? ?Extremity/Trunk Assessment   ?  ?  ?  ?  ?  ? ?Vision   ?  ?  ?Perception   ?  ?Praxis   ?  ? ?Cognition Arousal/Alertness: Awake/alert ?Behavior During Therapy: Flat affect, Restless ?Overall Cognitive Status: Impaired/Different from baseline ?  ?  ?  ?  ?  ?  ?  ?  ?  ?  ?  ?  ?  ?  ?  ?  ?General Comments: L inattention remains although slightly better than previous OT session with this OT. Slightly improved vocal quality/volume today. Easily distracted causing LOB. ?  ?  ?   ?Exercises Other Exercises ?Other Exercises: Pt engaged in dynamic reaching activities with L and R hands in various planes as well as bilaterally, worked on righting skills with PRN VC for propping on L elbow ? ?  ?Shoulder Instructions   ? ? ?  ?General Comments    ? ? ?Pertinent Vitals/ Pain         ? ?Home Living   ?  ?  ?  ?  ?  ?  ?  ?  ?  ?  ?  ?  ?  ?  ?  ?  ?  ?  ? ?  ?Prior Functioning/Environment    ?  ?  ?  ?   ? ?Frequency ? Min 3X/week  ? ? ? ? ?  ?Progress Toward Goals ? ?OT Goals(current goals can now be found in the care plan section) ? Progress towards OT goals: Progressing toward goals ? ?Acute Rehab OT Goals ?Patient Stated Goal: pt reports wanting to not be "stuck" ?OT Goal Formulation: With patient ?Time For Goal Achievement: 03/18/22 ?Potential to Achieve Goals: Good  ?Plan Discharge plan remains appropriate;Frequency remains appropriate   ? ?Co-evaluation ? ? ? PT/OT/SLP Co-Evaluation/Treatment: Yes ?Reason for Co-Treatment: Complexity of the patient's impairments (multi-system involvement);Necessary to address cognition/behavior during functional activity;For patient/therapist safety;To address functional/ADL transfers ?PT goals addressed during session: Mobility/safety with mobility;Balance ?OT goals addressed during session: ADL's and self-care;Other (comment) (balance, attention) ?  ? ?  ?AM-PAC OT "6 Clicks" Daily Activity     ?Outcome Measure ? ? Help from another person eating meals?: A Lot ?Help  from another person taking care of personal grooming?: A Little ?Help from another person toileting, which includes using toliet, bedpan, or urinal?: Total ?Help from another person bathing (including washing, rinsing, drying)?: A Lot ?Help from another person to put on and taking off regular upper body clothing?: A Lot ?Help from another person to put on and taking off regular lower body clothing?: A Lot ?6 Click Score: 12 ? ?  ?End of Session   ? ?OT Visit Diagnosis: Other abnormalities of gait and mobility (R26.89);Hemiplegia and hemiparesis;Other symptoms and signs involving cognitive function ?Hemiplegia - Right/Left: Left ?Hemiplegia - dominant/non-dominant: Non-Dominant ?Hemiplegia - caused by: Cerebral infarction ?  ?Activity Tolerance Patient tolerated treatment well ?  ?Patient Left in chair;with call bell/phone within reach;with chair alarm set ?  ?Nurse Communication Mobility status ?  ? ?   ? ?Time: 3254-9826 ?OT Time Calculation (min): 31 min ? ?Charges: OT General Charges ?$OT Visit: 1 Visit ?OT Treatments ?$Neuromuscular Re-education: 8-22 mins ? ?  Arman Filter., MPH, MS, OTR/L ?ascom [phone] ?03/06/22, 11:46 AM ? ?

## 2022-03-06 NOTE — Progress Notes (Signed)
?PROGRESS NOTE ? ? ?HPI was taken from Dr. Lorenso Courier: ?History of Present Illness: Patient s/p LEFT CEA on 01/25/2022 at our facility secondary to TIA and high grade stenosis. The patient had been well, presented for follow up without issue. Noted on 02/20/2022 she developed sudden swelling, pain and murky drainage from the incision. She was evaluated by urgent care on 02/22/2022 and it was felt she had a wound infection. She was treated with ABX. Last night the patient developed significant bleeding and hypotension to the 70s. Taken by EMS to [facility], digital pressure held, transfused pRBC. Neurologically intact. [facility] Forest[facility], with active bleeding. That facility and Vascular surgeon did not accept the patient. I accepted the patient for emergent intervention. Upon arrival EMS was holding pressure at neck incision, the patient was awake, alert and mentating and aware of situation. She Understood significant risks of bleeding, stroke and death. Consent obtained. ?  ?As per Dr. Mortimer Fries: ?02/25/22: Admit to ICU s/p left carotid artery repair in the setting of endarterectomy carotid rupture due to left carotid patch infection remained mechanically intubated postop ?02/28/22: Pt underwent left carotid artery stent placement  ?02/28/22: Will perform SBT with plans for possible extubation.  Pt unable to follow commands during WUA CT Head revealed subacute right MCA stroke as well as recent but older-appearing left MCA strokes x2.  Neurology consulted.  Unable to extubate  ?4/23 MRI brain: Moderate-to-large acute/early subacute right MCA territory infarct within the cortical/subcortical right frontal lobe as well as right insula and subinsular region. Petechial hemorrhage at this site. Local mass effect without significant ventricular ?4/23 NEUROLOGY CONSULTED ? ? ?ZAMIAH VANARTSDALEN  [id number] DOB: April 29, 1956 DOA:  02/24/2022 ?PCP: Patient, No Pcp Per (Inactive) ? ? ?Assessment & Plan: ?  ?Principal Problem: ?  Post-operative state ?Active Problems: ?  Rupture of carotid artery ([place]) ?  CVA (cerebral vascular accident) [facility]) ?  DM2 (diabetes mellitus, type 2) ([place]) ?  Hypokalemia ?  Dysphagia ?  Normocytic anemia ?  Leukocytosis ?  Thrombocytosis ?  Hyponatremia ? ?Assessment and Plan: ? ?Acute hypoxic & hypercapnic respiratory failure: s/p intubation, ventilation & extubation. Now weaned off o2, breathing comfortably. At risk for aspiration ? ?Rupture left carotid: from infected graft, now pod 10 from exploration for bleeding and pod 6 from left ICA stent. Stable, vascular surgery following. Continuing aspirin and plavix. No signs re-bleeding.  ? ?Strep constellatus infection: endovascular infection, ID following, advising at least 6 wks IV abx. Advising continue unasyn for now, switch to ceftriaxone as OPAT, through 5/28 ? ?Hyponatremia: resolved ? ?CVA: MRI brain 4/22 shows multiple small acute/early subacute cortical & subcortical infarcts w/ petechial hemorrhage associated w/ some of these infarcts. Chronic infarcts also noted. Now on aspirin and plavix and statin. Echo shows EF 60-65%, no regional wall motion abnormalities, diastolic function is normal, & atrial septum is grossly normal. Neuro recs apprec, they have signed off. Plan for SNF ? ?Dysphagia: remains at high risk for aspiration. Has removed her NG tube and declines replacement. Needs PEG tube and is agreeable to that ? ?Hip pain: no fall. Will check x-ray ? ?DM2: poorly controlled. Continue on levemir, SSI w/ accuchecks ? ?Hypokalemia: KCl repleated ? ?Dysphagia: likely secondary to CVA. NPO. S/p NG tube placed & started on tube feeds today. Speech following. Patient and daughter would like to proceed with PEG ? ?Leukocytosis: secondary to  above.  ? ?Normocytic anemia: H&H are stable. Will transfuse if Hb < 7.0 ? ? ? ? ? ?DVT prophylaxis: lovenox ?Code Status:   full  ?Family Communication: daughter robin updated telephonically 4/26 ?Disposition Plan: PT recs SNF  ? ?Level of care: Stepdown ? ?Status is: Inpatient ?Remains inpatient appropriate because: severity of illness ? ? ? ?Consultants:  ?ICU ?Vasc surg  ?Neuro  ? ?Procedures: ? ?Antimicrobials: unasyn  ? ?Subjective: ?Complaining of hip pain, no ? ?Objective: ?Vitals:  ? 03/06/22 0500 03/06/22 0600 03/06/22 0800 03/06/22 1210  ?BP:  (!) 156/69 (!) 163/57 (!) 159/70  ?Pulse: 85 91 98 87  ?Resp: (!) 29 (!) 29 (!) 39 (!) 26  ?Temp:   97.6 ?F (36.4 ?C)   ?TempSrc:   Oral   ?SpO2: 99% 95% 96% 94%  ?Weight: 75.7 kg     ?Height:      ? ? ?Intake/Output Summary (Last 24 hours) at 03/06/2022 1257 ?Last data filed at 03/06/2022 [id number] ?Gross per 24 hour  ?Intake 294.7 ml  ?Output 2626 ml  ?Net -2331.3 ml  ? ?Filed Weights  ? 03/04/22 0458 03/05/22 0500 03/06/22 0500  ?Weight: 82.9 kg 77.4 kg 75.7 kg  ? ? ?Examination: ? ?General exam: Appears calm and comfortable  ?Respiratory system: diminished breath sounds b/l ?Cardiovascular system: S1 & S2 +. No  rubs, gallops or clicks.  ?Gastrointestinal system: Abdomen is nondistended, soft and nontender. Normal bowel sounds heard. ?Central nervous system: Lethargic. Would intermittently follow simple commands. Communicates baseline needs ?Psychiatry: Judgement and insight appears not at baseline. Flat mood and affect ? ? ? ?Data Reviewed: I have personally reviewed following labs and imaging studies ? ?CBC: ?Recent Labs  ?Lab 03/02/22 ?0429 03/03/22 ?0414 03/04/22 ?[id number] 03/05/22 ?[id number] 03/06/22 ?[id number]  ?WBC 8.2 12.0* 9.5 14.5* 10.1  ?HGB 9.4* 10.2* 9.5* 9.6* 9.9*  ?HCT 27.9* 29.7* 27.4* 28.5* 29.6*  ?MCV 87.5 86.8 87.0 88.0 87.8  ?PLT 322 431* 418* 531* 604*  ? ?Basic Metabolic Panel: ?Recent Labs  ?Lab 03/02/22 ?0429 03/03/22 ?0414 03/03/22 ?1348 03/03/22 ?2040 03/04/22 ?[id number] 03/05/22 ?[id number] 03/06/22 ?[id number]  ?NA 133* 133* 130*  --  133* 133* 135  ?K 3.6 2.7* 3.1* 3.5 3.9 3.1* 3.5  ?CL 96*  93* 93*  --  96* 97* 94*  ?CO2 31 34* 32  --  30 28 30   ?GLUCOSE 177* 129* 81  --  175* 98 139*  ?BUN 15 12 10   --  14 16 13   ?CREATININE 0.38* 0.35* 0.39*  --  0.34* 0.39* 0.41*  ?CALCIUM 8.3* 8.3* 8.3*  --  8.5* 8.3* 8.5*  ?MG 1.8 1.8  --   --  2.2 2.0 1.9  ?PHOS 3.8 4.0 3.6  --  4.3 3.9 3.0  ? ?GFR: ?Estimated Creatinine Clearance: 68.5 mL/min (A) (by C-G formula based on SCr of 0.41 mg/dL (L)). ?Liver Function Tests: ?Recent Labs  ?Lab 03/03/22 ?1348  ?ALBUMIN 2.4*  ? ?No results for input(s): LIPASE, AMYLASE in the last 168 hours. ?No results for input(s): AMMONIA in the last 168 hours. ?Coagulation Profile: ?No results for input(s): INR, PROTIME in the last 168 hours. ?Cardiac Enzymes: ?No results for input(s): CKTOTAL, CKMB, CKMBINDEX, TROPONINI in the last 168 hours. ?BNP (last 3 results) ?No results for input(s): PROBNP in the last 8760 hours. ?HbA1C: ?No results for input(s): HGBA1C in the last 72 hours. ?CBG: ?Recent Labs  ?Lab 03/05/22 ?1932 03/06/22 ?0010 03/06/22 ?0409 03/06/22 ?0719 03/06/22 ?1116  ?GLUCAP 161* 137* 110* 137*  134*  ? ?Lipid Profile: ?No results for input(s): CHOL, HDL, LDLCALC, TRIG, CHOLHDL, LDLDIRECT in the last 72 hours. ?Thyroid Function Tests: ?No results for input(s): TSH, T4TOTAL, FREET4, T3FREE, THYROIDAB in the last 72 hours. ?Anemia Panel: ?No results for input(s): VITAMINB12, FOLATE, FERRITIN, TIBC, IRON, RETICCTPCT in the last 72 hours. ?Sepsis Labs: ?No results for input(s): PROCALCITON, LATICACIDVEN in the last 168 hours. ? ? ?Recent Results (from the past 240 hour(s))  ?Aerobic/Anaerobic Culture w Gram Stain (surgical/deep wound)     Status: None  ? Collection Time: 02/25/22  2:45 AM  ? Specimen: PATH Other; Tissue  ?Result Value Ref Range Status  ? Specimen Description   Final  ?  ARTERY LEFT CAROTID ARTERY PATCH ?Performed at [facility], [address], [place], [place] [address] ?  ? Special Requests   Final  ?  NONE ?Performed at [facility],  [address] [place], [place] [address] ?  ? Gram Stain NO WBC SEEN ?NO ORGANISMS SEEN ?  Final  ? Culture   Final  ?  FEW STREPTOCOCCUS CONSTELLATUS ?CRITICAL RESULT CALLED TO, READ BACK BY AND V

## 2022-03-06 NOTE — TOC Progression Note (Addendum)
Transition of Care (TOC) - Progression Note  ? ? ?Patient Details  ?Name: Veronica Murray ?MRN: [id number] ?Date of Birth: 1956/10/14 ? ?Transition of Care (TOC) CM/SW Contact  ?Margarito Liner, LCSW ?Phone Number: ?03/06/2022, 1:53 PM ? ?Clinical Narrative: Uploaded clinicals into [place] [facility] for PASARR review. Nutrition plan pending. Will wait to give SNF bed offers until plan determined.   ? ?2:11 pm: PASARR obtained: [id number]. Expires 5/26. ? ?Expected Discharge Plan: Skilled Nursing Facility ?Barriers to Discharge: Continued Medical Work up ? ?Expected Discharge Plan and Services ?Expected Discharge Plan: Skilled Nursing Facility ?  ?Discharge Planning Services: CM Consult ?Post Acute Care Choice: Skilled Nursing Facility ?Living arrangements for the past 2 months: Hotel/Motel ?                ?DME Arranged: N/A ?DME Agency: NA ?  ?  ?  ?  ?  ?  ?  ?  ? ? ?Social Determinants of Health (SDOH) Interventions ?  ? ?Readmission Risk Interventions ?   ? View : No data to display.  ?  ?  ?  ? ? ?

## 2022-03-06 NOTE — Consult Note (Signed)
PHARMACY CONSULT NOTE - FOLLOW UP ? ?Pharmacy Consult for Electrolyte Monitoring and Replacement  ? ?Recent Labs: ?Potassium (mmol/L)  ?Date Value  ?03/06/2022 3.5  ? ?Magnesium (mg/dL)  ?Date Value  ?03/06/2022 1.9  ? ?Calcium (mg/dL)  ?Date Value  ?03/06/2022 8.5 (L)  ? ?Albumin (g/dL)  ?Date Value  ?03/03/2022 2.4 (L)  ? ?Phosphorus (mg/dL)  ?Date Value  ?03/06/2022 3.0  ? ?Sodium (mmol/L)  ?Date Value  ?03/06/2022 135  ? ?4/26: Corrected Calcium 9.8 mg/dL ? ?Assessment: ?66 year old female presenting with ruptured carotic endarterectomy wound. 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 Patient is intubated and sedated in the ICU. Pharmacy has been consulted to manage electrolytes. ?   ?On feeding tube osmolite 1.5CAL @ 55 ml/hr ?Free water 30 mL Q4h ? ?On D5-NaCl 0.45% infusion @ 165ml/hr from 4/26 1400 ?On Ampicillin-Sulbactam 3g in NaCl 0.9% 100 mL IV Q6h since 4/20. ? ?Potassium is lower than range, at 3.5 mmol/L at 4/26 0549 ?Magnesium is lower than range, at 1.9 mg/dL at 6/29 5284 ? ?Goal of Therapy:  ?K 4.0 - 5.1 mmol/L ?Mg 2.0 - 2.4 mg/dL ?Other electrolytes WNL ? ?Plan:  ?Kcl 10 mEq in 50 mL x2 at 4/26 1000 as scheduled. ?Magnesium sulfate 1g IV in 100 mL at 4/26 0900 as scheduled. ? ?F/u AM labs. ? ?Vedia Pereyra ,PharmD Candidate [employer] 2025 ?03/06/2022 7:46 AM  ?

## 2022-03-06 NOTE — Progress Notes (Signed)
Patient is alert and verbal. She removed her Dobbhoff and placed it in the bed. Hospitalist was notified and stated to replace it. Replacement was attempted by two separate nurses and patient did not tolerate well. Her heart and respiratory rates increased and her sats dropped. Patient states that she did not want the tube anyway. Bloody secretions also noted when trying to put the line down. Patient is giving a thumbs up signaling that she is fine now. Will continue to monitor.  ?

## 2022-03-06 NOTE — Progress Notes (Signed)
Attempted to place Dobbhoff. Pt held up her hands and stated, "I don't want it." Pt states, "I want to eat.Marland KitchenMarland KitchenI can eat". Informed that speech will see her today to determine ability to eat. Wouk, MD notified. ?

## 2022-03-06 NOTE — Progress Notes (Signed)
Nutrition Follow Up Note  ? ?DOCUMENTATION CODES:  ? ?Obesity unspecified ? ?INTERVENTION:  ? ?If feeding tube replaced, recommend: ? ?Osmolite 1.5$RemoveBefore'@55ml'TnjEKyJyNwXtq$ /hr- Initiate at 3ml/hr and increase by 30ml/hr q 8 hours until goal rate is reached.  ? ?Pro-Source 61ml BID via tube, provides 40kcal and 11g of protein per serving   ? ?Free water flushes 68ml q4 hours to maintain tube patency  ? ?Regimen provides 2060kcal/day, 105g/day protein and 1158ml/day of free water  ? ?NUTRITION DIAGNOSIS:  ? ?Inadequate oral intake related to inability to eat (pt sedated and ventilated) as evidenced by NPO status. ?-ongoing ? ?GOAL:  ? ?Patient will meet greater than or equal to 90% of their needs ?-previously met with tube feeds  ? ?MONITOR:  ? ?Diet advancement, Labs, Weight trends, TF tolerance, Skin, I & O's ? ?ASSESSMENT:  ? 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 ? ?CT scan from 4/21 reports new MCA infarcts  ? ?Pt removed NGT this morning; RN unable to replace it after two attempts and now pt is refusing to have it replaced. Pt stating that she wants to eat. Pt seen by SLP today who recommended continued NPO with consideration of G-tube. RD also agrees that G-tube should be considered as swallowing issues seem to be r/t pt's MCA stroke and will likely take some time to resolve. Pt will need to be able to consume ~2000kcal/day to meet her estimated needs and to support recovery. Pt's nutritional status was discussed with MD, RN, SLP and TOC. Per chart, pt is down ~3lbs(2%) from her admit weight; pt +1.9L on her I & Os. RD will monitor for continued GOC.    ? ?Medications reviewed and include: aspirin, plavix,  insulin, reglan, protonix, unasyn, NaCl w/ 5% dextrose $RemoveBef'@50ml'wXyKDJhLXZ$ /hr, KCl ? ?Labs reviewed: K 3.5 wnl, creat 0.41(L), P 3.0 wnl, Mg 1.9 wnl ?Hgb 9.9(L), Hct 29.6(L) ?Cbgs- 134, 137, 110, 137 x 24 hrs ? ?Diet Order:   ?Diet Order   ? ?       ?  Diet NPO time specified  Diet effective now       ?  ? ?  ?  ? ?  ? ?EDUCATION NEEDS:  ? ?No education needs have been identified at this time ? ?Skin:  Skin Assessment: Skin Integrity Issues: ?Skin Integrity Issues:: Incisions ?Incisions: closed lt neck ? ?Last BM:  4/23- TYPE 6 ? ?Height:  ? ?Ht Readings from Last 1 Encounters:  ?02/25/22 $RemoveBe'5\' 2"'fUosfMBwd$  (1.575 m)  ? ? ?Weight:  ? ?Wt Readings from Last 1 Encounters:  ?03/06/22 75.7 kg  ? ? ?Ideal Body Weight:  50 kg ? ?BMI:  Body mass index is 30.52 kg/m?. ? ?Estimated Nutritional Needs:  ? ?Kcal:  1800-2100kcal/day ? ?Protein:  90-105g/day ? ?Fluid:  1.5-1.8L/day ? ?Koleen Distance MS, RD, LDN ?Please refer to AMION for RD and/or RD on-call/weekend/after hours pager ? ?

## 2022-03-07 ENCOUNTER — Inpatient Hospital Stay: Payer: Medicare HMO

## 2022-03-07 DIAGNOSIS — I69391 Dysphagia following cerebral infarction: Secondary | ICD-10-CM | POA: Diagnosis not present

## 2022-03-07 DIAGNOSIS — Z9889 Other specified postprocedural states: Secondary | ICD-10-CM | POA: Diagnosis not present

## 2022-03-07 LAB — CBC WITH DIFFERENTIAL/PLATELET
Abs Immature Granulocytes: 0.02 10*3/uL (ref 0.00–0.07)
Abs Immature Granulocytes: 0.02 10*3/uL (ref 0.00–0.07)
Basophils Absolute: 0 10*3/uL (ref 0.0–0.1)
Basophils Absolute: 0 10*3/uL (ref 0.0–0.1)
Basophils Relative: 1 %
Basophils Relative: 1 %
Eosinophils Absolute: 0.2 10*3/uL (ref 0.0–0.5)
Eosinophils Absolute: 0.3 10*3/uL (ref 0.0–0.5)
Eosinophils Relative: 4 %
Eosinophils Relative: 4 %
HCT: 20.6 % — ABNORMAL LOW (ref 36.0–46.0)
HCT: 28.9 % — ABNORMAL LOW (ref 36.0–46.0)
Hemoglobin: 7 g/dL — ABNORMAL LOW (ref 12.0–15.0)
Hemoglobin: 9.8 g/dL — ABNORMAL LOW (ref 12.0–15.0)
Immature Granulocytes: 0 %
Immature Granulocytes: 0 %
Lymphocytes Relative: 28 %
Lymphocytes Relative: 26 %
Lymphs Abs: 1.6 10*3/uL (ref 0.7–4.0)
Lymphs Abs: 1.9 10*3/uL (ref 0.7–4.0)
MCH: 29.9 pg (ref 26.0–34.0)
MCH: 29.3 pg (ref 26.0–34.0)
MCHC: 34 g/dL (ref 30.0–36.0)
MCHC: 33.9 g/dL (ref 30.0–36.0)
MCV: 88 fL (ref 80.0–100.0)
MCV: 86.3 fL (ref 80.0–100.0)
Monocytes Absolute: 0.5 10*3/uL (ref 0.1–1.0)
Monocytes Absolute: 0.6 10*3/uL (ref 0.1–1.0)
Monocytes Relative: 10 %
Monocytes Relative: 8 %
Neutro Abs: 3.3 10*3/uL (ref 1.7–7.7)
Neutro Abs: 4.6 10*3/uL (ref 1.7–7.7)
Neutrophils Relative %: 57 %
Neutrophils Relative %: 61 %
Platelets: 437 10*3/uL — ABNORMAL HIGH (ref 150–400)
Platelets: 617 10*3/uL — ABNORMAL HIGH (ref 150–400)
RBC: 2.34 MIL/uL — ABNORMAL LOW (ref 3.87–5.11)
RBC: 3.35 MIL/uL — ABNORMAL LOW (ref 3.87–5.11)
RDW: 12.7 % (ref 11.5–15.5)
RDW: 12.8 % (ref 11.5–15.5)
WBC: 5.6 10*3/uL (ref 4.0–10.5)
WBC: 7.5 10*3/uL (ref 4.0–10.5)
nRBC: 0 % (ref 0.0–0.2)
nRBC: 0 % (ref 0.0–0.2)

## 2022-03-07 LAB — COMPREHENSIVE METABOLIC PANEL
ALT: 24 U/L (ref 0–44)
AST: 22 U/L (ref 15–41)
Albumin: 2.5 g/dL — ABNORMAL LOW (ref 3.5–5.0)
Alkaline Phosphatase: 115 U/L (ref 38–126)
Anion gap: 10 (ref 5–15)
BUN: 11 mg/dL (ref 8–23)
CO2: 25 mmol/L (ref 22–32)
Calcium: 8.3 mg/dL — ABNORMAL LOW (ref 8.9–10.3)
Chloride: 101 mmol/L (ref 98–111)
Creatinine, Ser: 0.4 mg/dL — ABNORMAL LOW (ref 0.44–1.00)
GFR, Estimated: >60 mL/min (ref >60)
Glucose, Bld: 160 mg/dL — ABNORMAL HIGH (ref 70–99)
Potassium: 3.6 mmol/L (ref 3.5–5.1)
Sodium: 136 mmol/L (ref 135–145)
Total Bilirubin: 0.8 mg/dL (ref 0.3–1.2)
Total Protein: 5.9 g/dL — ABNORMAL LOW (ref 6.5–8.1)

## 2022-03-07 LAB — GLUCOSE, CAPILLARY
Glucose-Capillary: 198 mg/dL — ABNORMAL HIGH (ref 70–99)
Glucose-Capillary: 167 mg/dL — ABNORMAL HIGH (ref 70–99)
Glucose-Capillary: 131 mg/dL — ABNORMAL HIGH (ref 70–99)
Glucose-Capillary: 103 mg/dL — ABNORMAL HIGH (ref 70–99)
Glucose-Capillary: 162 mg/dL — ABNORMAL HIGH (ref 70–99)
Glucose-Capillary: 150 mg/dL — ABNORMAL HIGH (ref 70–99)

## 2022-03-07 LAB — LACTIC ACID, PLASMA: Lactic Acid, Venous: 0.9 mmol/L (ref 0.5–1.9)

## 2022-03-07 LAB — BASIC METABOLIC PANEL
Anion gap: 8 (ref 5–15)
BUN: 11 mg/dL (ref 8–23)
CO2: 28 mmol/L (ref 22–32)
Calcium: 8.1 mg/dL — ABNORMAL LOW (ref 8.9–10.3)
Chloride: 97 mmol/L — ABNORMAL LOW (ref 98–111)
Creatinine, Ser: 0.36 mg/dL — ABNORMAL LOW (ref 0.44–1.00)
GFR, Estimated: >60 mL/min (ref >60)
Glucose, Bld: 207 mg/dL — ABNORMAL HIGH (ref 70–99)
Potassium: 3 mmol/L — ABNORMAL LOW (ref 3.5–5.1)
Sodium: 133 mmol/L — ABNORMAL LOW (ref 135–145)

## 2022-03-07 LAB — CREATININE, SERUM
Creatinine, Ser: 0.35 mg/dL — ABNORMAL LOW (ref 0.44–1.00)
GFR, Estimated: >60 mL/min (ref >60)

## 2022-03-07 LAB — PHOSPHORUS: Phosphorus: 3.1 mg/dL (ref 2.5–4.6)

## 2022-03-07 LAB — MAGNESIUM: Magnesium: 1.9 mg/dL (ref 1.7–2.4)

## 2022-03-07 IMAGING — CT CT ABD-PELV W/O CM
2 of 7 series · 14 of 46 positions shown, 18 images · non-contrast
Comparison: Angiography [DATE]

CLINICAL DATA: Acute abdominal pain.  Inpatient.



[Series 2: routine abd/pel wo · axial · 0.89mm/px · z∈[-1445,-1025]mm · 11 of 96 slices shown, 15 images]
[im 6/96  soft-tissue]
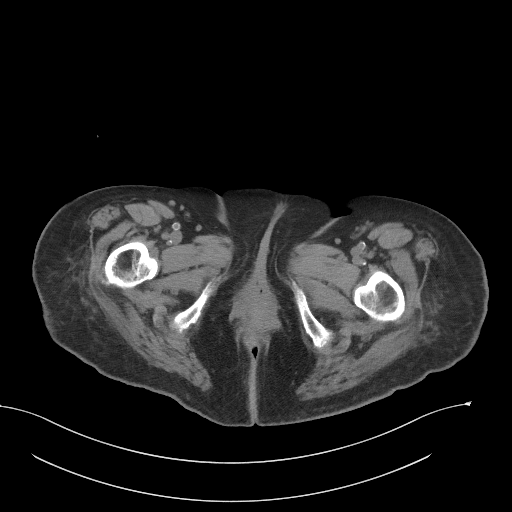
[im 6/96  bone]
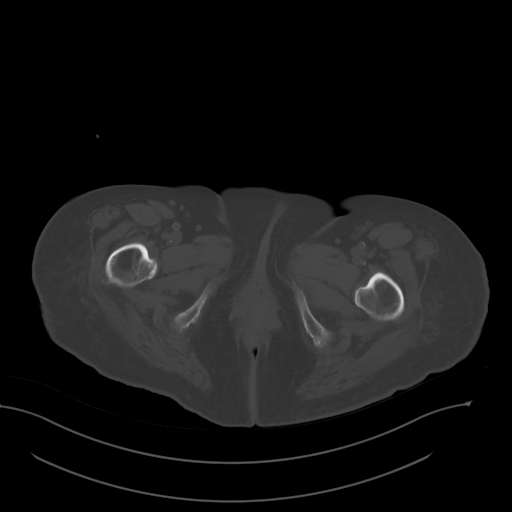
[im 16/96  soft-tissue]
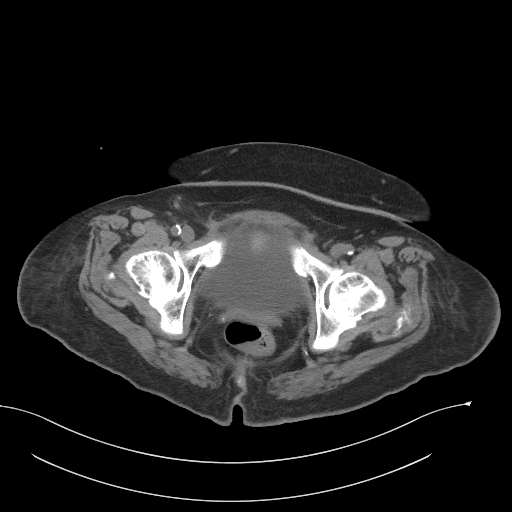
[im 27/96  soft-tissue]
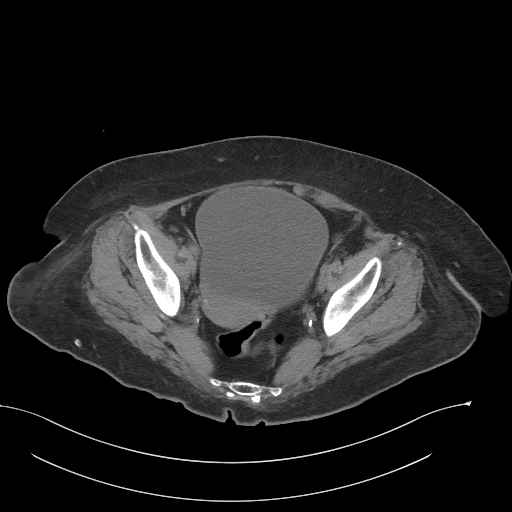
[im 37/96  soft-tissue]
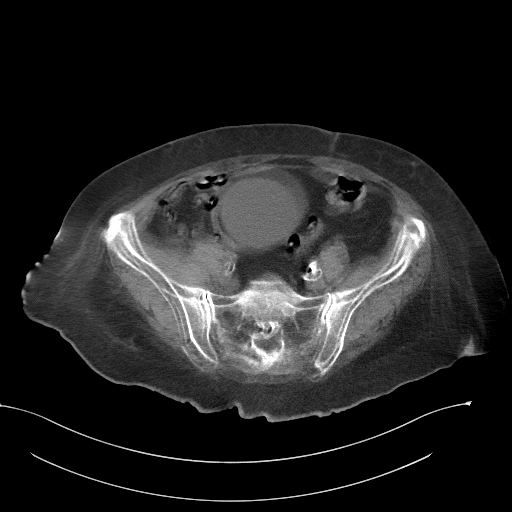
[im 48/96  soft-tissue]
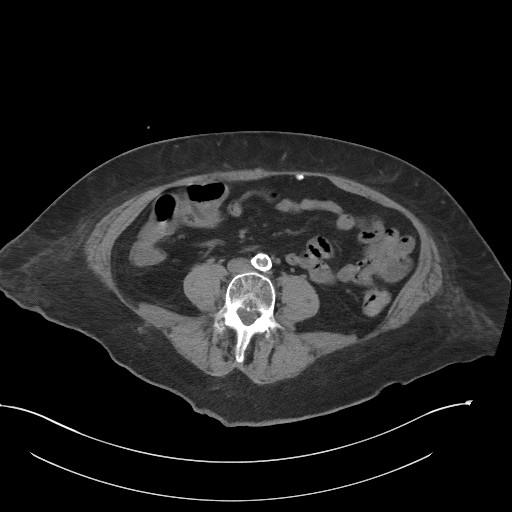
[im 59/96  soft-tissue]
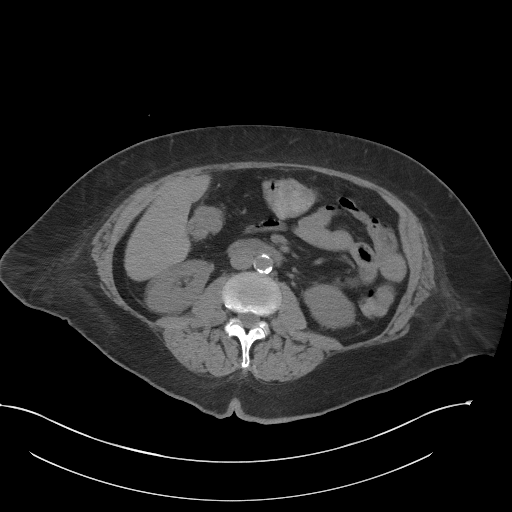
[im 69/96  soft-tissue]
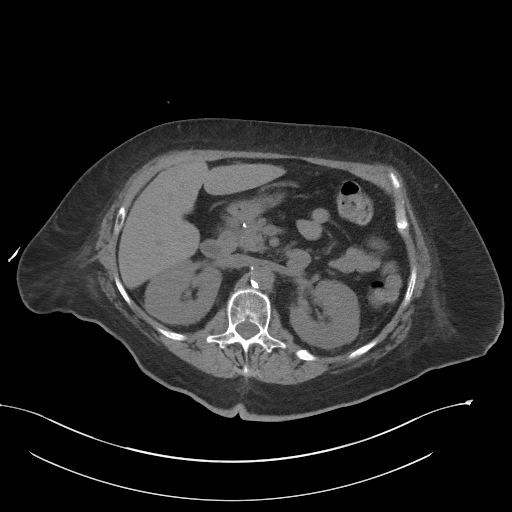
[im 74/96  lung]
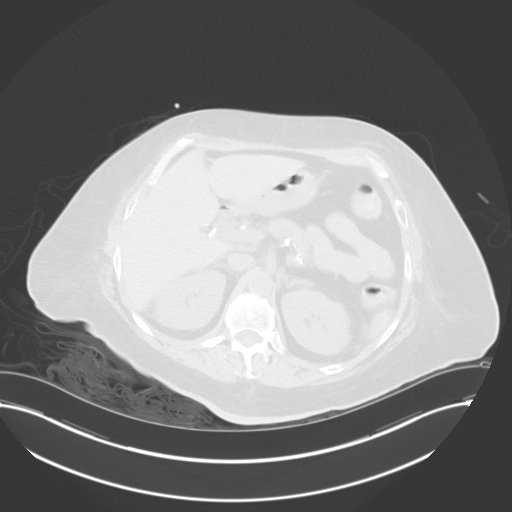
[im 80/96  soft-tissue]
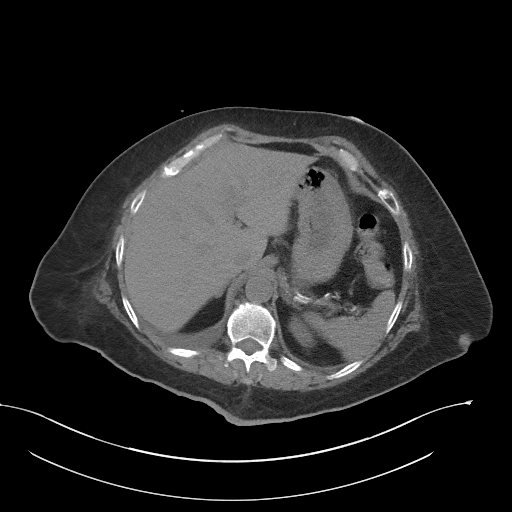
[im 80/96  lung]
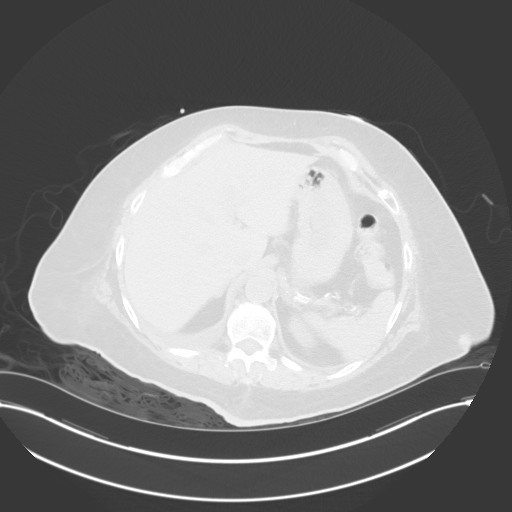
[im 85/96  lung]
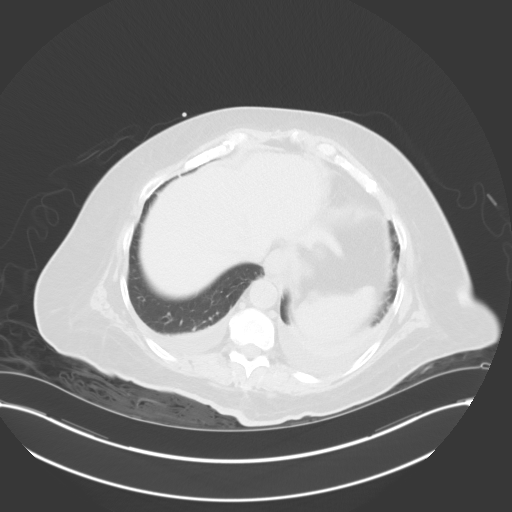
[im 90/96  soft-tissue]
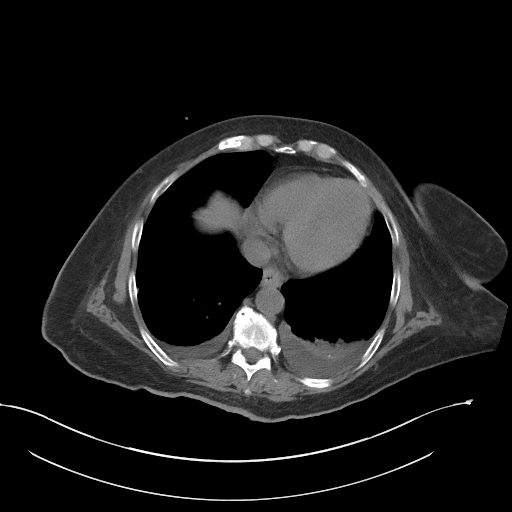
[im 90/96  lung]
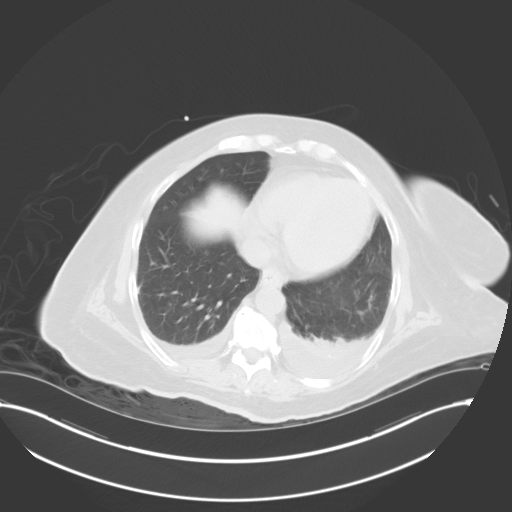
[im 90/96  bone]
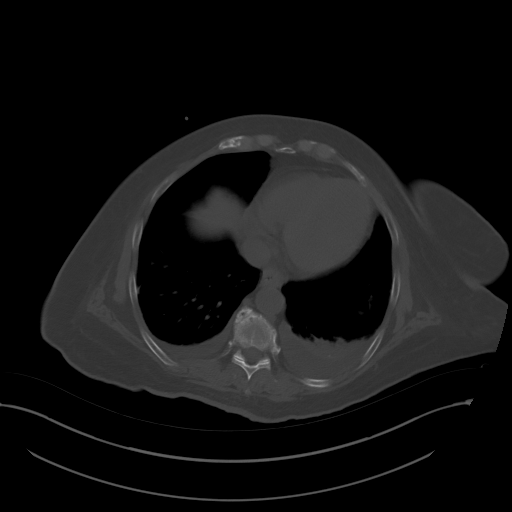

[Series 6: coronal st · coronal · 0.90mm/px · 3 of 86 slices shown]
[im 22/86  soft-tissue]
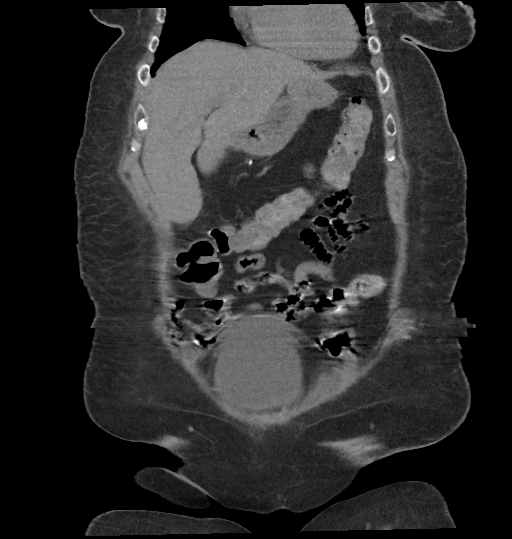
[im 43/86  soft-tissue]
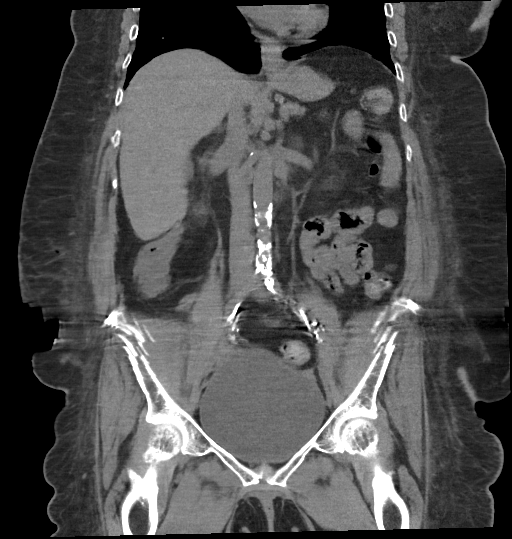
[im 64/86  soft-tissue]
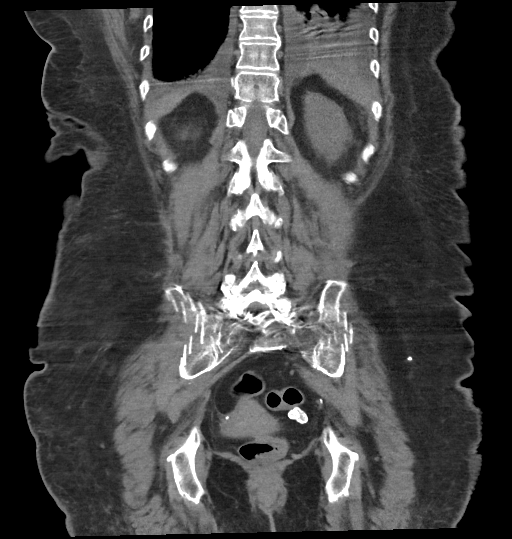

[14 of 46 positions shown; findings below may reference images not displayed]

FINDINGS: Lower chest: Small to moderate left and small right pleural effusion
with associated compressive atelectasis. Calcified granuloma in the
right lower lobe.

Hepatobiliary: Focal fatty infiltration adjacent to the falciform
ligament. No suspicious liver lesion on this unenhanced exam. Clips
in the gallbladder fossa postcholecystectomy. No biliary dilatation.

Pancreas: No ductal dilatation or inflammation.

Spleen: Normal in size without focal abnormality on this unenhanced
exam

Adrenals/Urinary Tract: No adrenal nodule. No hydronephrosis or
renal calculi. No focal renal abnormality is seen. The urinary
bladder is distended to the umbilicus. Bladder volume = 991 cm^3.
No bladder wall thickening.

Stomach/Bowel: Detailed bowel assessment is limited in the absence
of enteric contrast and patient motion artifact. Minimal wall
thickening of the distal esophagus. The stomach is nondistended
there is no small bowel obstruction. No evidence of small bowel
inflammation. The appendix is normal, although partially obscured by
motion. Portions of the colon are nondistended which limits
assessment for wall thickening. There is no pericolonic edema or
obvious colonic inflammation.

Vascular/Lymphatic: Advanced aortic atherosclerosis. No aortic
aneurysm no portal venous or mesenteric gas. Scattered calcified
lymph nodes in the abdomen or pelvis, no suspicious noncalcified
adenopathy.

Reproductive: Quiescent appearance of the uterus. The ovaries are
not seen, maxillary regions are motion obscured. No adnexal.

Other: No ascites. No free air. There is mild generalized body wall
edema in the flanks.

Musculoskeletal: Flattening of L5 vertebra appears chronic, although
detailed assessment is obscured by motion at this level. Mild
bilateral hip osteoarthritis. No acute osseous findings.
IMPRESSION: 1. Distended urinary bladder to the umbilicus, suspicious for
urinary retention. No bladder wall thickening.
2. Small to moderate left and small right pleural effusions with
associated compressive atelectasis. Mild generalized body wall edema
in the flanks.
3. Minimal wall thickening of the distal esophagus, can be seen with
reflux or esophagitis.

Aortic Atherosclerosis ([HO]-[HO]).

## 2022-03-07 MED ORDER — FENTANYL CITRATE PF 50 MCG/ML IJ SOSY
12.5000 ug | PREFILLED_SYRINGE | Freq: Once | INTRAMUSCULAR | Status: AC
  Administered 2022-03-07: 12.5 ug via INTRAVENOUS
  Filled 2022-03-07: qty 1

## 2022-03-07 MED ORDER — KCL IN DEXTROSE-NACL 20-5-0.45 MEQ/L-%-% IV SOLN
INTRAVENOUS | Status: DC
  Filled 2022-03-07: qty 1000

## 2022-03-07 MED ORDER — CALCIUM GLUCONATE-NACL 2-0.675 GM/100ML-% IV SOLN
2.0000 g | Freq: Once | INTRAVENOUS | Status: DC
  Filled 2022-03-07: qty 100

## 2022-03-07 MED ORDER — POTASSIUM CHLORIDE IN NACL 20-0.9 MEQ/L-% IV SOLN
INTRAVENOUS | Status: AC
Start: 2022-03-07 — End: 2022-03-08
  Filled 2022-03-07 (×3): qty 1000

## 2022-03-07 MED ORDER — ACETAMINOPHEN 10 MG/ML IV SOLN
1000.0000 mg | Freq: Four times a day (QID) | INTRAVENOUS | Status: AC | PRN
  Filled 2022-03-07 (×2): qty 100

## 2022-03-07 MED ORDER — CALCIUM GLUCONATE-NACL 2-0.675 GM/100ML-% IV SOLN
2.0000 g | Freq: Once | INTRAVENOUS | Status: DC
  Administered 2022-03-07: 2000 mg via INTRAVENOUS
  Filled 2022-03-07: qty 100

## 2022-03-07 MED ORDER — ASPIRIN 300 MG RE SUPP
300.0000 mg | Freq: Every day | RECTAL | Status: DC
  Administered 2022-03-09 – 2022-03-10 (×2): 300 mg via RECTAL
  Filled 2022-03-07 (×4): qty 1

## 2022-03-07 MED ORDER — MAGNESIUM SULFATE IN D5W 1-5 GM/100ML-% IV SOLN
1.0000 g | Freq: Once | INTRAVENOUS | Status: DC
  Filled 2022-03-07: qty 100

## 2022-03-07 MED ORDER — POTASSIUM CHLORIDE 10 MEQ/100ML IV SOLN
10.0000 meq | INTRAVENOUS | Status: DC
  Filled 2022-03-07: qty 100

## 2022-03-07 MED ORDER — POTASSIUM CHLORIDE 10 MEQ/50ML IV SOLN
10.0000 meq | INTRAVENOUS | Status: AC
  Administered 2022-03-07 (×4): 10 meq via INTRAVENOUS
  Filled 2022-03-07 (×4): qty 50

## 2022-03-07 MED ORDER — SODIUM CHLORIDE 0.9 % IV SOLN: 4.0000 g | Freq: Once | INTRAVENOUS | Status: DC

## 2022-03-07 NOTE — Progress Notes (Signed)
Occupational Therapy Treatment ?Patient Details ?Name: Veronica Murray ?MRN: [id number] ?DOB: 25-Oct-1956 ?Today's Date: 03/07/2022 ? ? ?History of present illness 66 yo F transferred emergently to [facility] ED for emergent vascular intervention on a ruptured carotid endarterectomy wound. Intubated on 4/16, extubated on 4/24. 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 Petechial hemorrhage at this site."  Hospital course significant for L neck exploration with surture repair to L carotid (02/24/22) and placement of embolic protective device L carotid (02/28/22) ?  ?OT comments ? Pt seen for OT tx soon after recent PT session. Pt up in recliner, agreeable to session. Pt engaged in dynamic reaching activity involving crossing midline, pinch, finger to palm translation, and and bilateral tasks requiring VC intermittenly for LUE involvement and intermittent tactile cues to limit RUE involvement. Pt fatigues quickly this session. Will allow for more time between therapy sessions next time to support improved participation tolerance.   ? ?Recommendations for follow up therapy are one component of a multi-disciplinary discharge planning process, led by the attending physician.  Recommendations may be updated based on patient status, additional functional criteria and insurance authorization. ?   ?Follow Up Recommendations ? Skilled nursing-short term rehab (<3 hours/day)  ?  ?Assistance Recommended at Discharge Frequent or constant Supervision/Assistance  ?Patient can return home with the following ? Two people to help with bathing/dressing/bathroom;Two people to help with walking and/or transfers;Assistance with cooking/housework;Assist for transportation;Direct supervision/assist for medications management;Direct supervision/assist for financial management;Help with stairs or ramp for  entrance ?  ?Equipment Recommendations ? Other (comment) (TBD next venue)  ?  ?Recommendations for Other Services   ? ?  ?Precautions / Restrictions Precautions ?Precautions: Fall ?Restrictions ?Weight Bearing Restrictions: No  ? ? ?  ? ?Mobility Bed Mobility ?  ?  ?  ?  ?  ?  ?  ?General bed mobility comments: NT, up in recliner after PT ?  ? ?Transfers ?  ?  ?  ?  ?  ?  ?  ?  ?  ?  ?  ?  ?Balance Overall balance assessment: Needs assistance ?Sitting-balance support: Feet supported, No upper extremity supported, Single extremity supported ?Sitting balance-Leahy Scale: Fair ?  ?  ?  ?  ?  ?  ?  ?  ?  ?  ?  ?  ?  ?  ?  ?  ?   ? ?ADL either performed or assessed with clinical judgement  ? ?ADL Overall ADL's : Needs assistance/impaired ?  ?  ?Grooming: Sitting;Set up;Oral care ?Grooming Details (indicate cue type and reason): using a sponge pt initiates brushing gums/teeth with sponge using R hand, VC to attempt with L hand which she does but switches back to R hand requiring cues and tactile cues to continue using L ?  ?  ?  ?  ?  ?  ?  ?  ?  ?  ?  ?  ?  ?  ?  ?  ?  ? ?Extremity/Trunk Assessment   ?  ?  ?  ?  ?  ? ?Vision   ?  ?  ?Perception   ?  ?Praxis   ?  ? ?Cognition Arousal/Alertness: Awake/alert ?Behavior During Therapy: Flat affect, Restless ?Overall Cognitive Status: Impaired/Different from baseline ?Area of Impairment: Orientation, Attention, Memory, Following commands, Safety/judgement, Awareness, Problem solving ?  ?  ?  ?  ?  ?  ?  ?  ?  Orientation Level: Person, Place ?Current Attention Level: Focused ?Memory: Decreased short-term memory ?Following Commands: Follows one step commands with increased time ?Safety/Judgement: Decreased awareness of deficits ?  ?Problem Solving: Slow processing, Decreased initiation, Difficulty sequencing, Requires verbal cues, Requires tactile cues ?  ?  ?  ?   ?Exercises Other Exercises ?Other Exercises: Dynamic reaching crossing midline and bilateral tasks requiring VC  intermittenly for LUE involvement ? ?  ?Shoulder Instructions   ? ? ?  ?General Comments    ? ? ?Pertinent Vitals/ Pain         ? ?Home Living   ?  ?  ?  ?  ?  ?  ?  ?  ?  ?  ?  ?  ?  ?  ?  ?  ?  ?  ? ?  ?Prior Functioning/Environment    ?  ?  ?  ?   ? ?Frequency ? Min 3X/week  ? ? ? ? ?  ?Progress Toward Goals ? ?OT Goals(current goals can now be found in the care plan section) ? Progress towards OT goals: Progressing toward goals ? ?Acute Rehab OT Goals ?Patient Stated Goal: pt reports wanting to not be "stuck" ?OT Goal Formulation: With patient ?Time For Goal Achievement: 03/18/22 ?Potential to Achieve Goals: Good  ?Plan Discharge plan remains appropriate;Frequency remains appropriate   ? ?Co-evaluation ? ? ?   ?  ?  ?  ?  ? ?  ?AM-PAC OT "6 Clicks" Daily Activity     ?Outcome Measure ? ? Help from another person eating meals?: A Lot ?Help from another person taking care of personal grooming?: A Little ?Help from another person toileting, which includes using toliet, bedpan, or urinal?: Total ?Help from another person bathing (including washing, rinsing, drying)?: A Lot ?Help from another person to put on and taking off regular upper body clothing?: A Lot ?Help from another person to put on and taking off regular lower body clothing?: A Lot ?6 Click Score: 12 ? ?  ?End of Session   ? ?OT Visit Diagnosis: Other abnormalities of gait and mobility (R26.89);Hemiplegia and hemiparesis;Other symptoms and signs involving cognitive function ?Hemiplegia - Right/Left: Left ?Hemiplegia - dominant/non-dominant: Non-Dominant ?Hemiplegia - caused by: Cerebral infarction ?  ?Activity Tolerance Patient tolerated treatment well ?  ?Patient Left in chair;with call bell/phone within reach;with chair alarm set ?  ?Nurse Communication   ?  ? ?   ? ?Time: 0630-1601 ?OT Time Calculation (min): 13 min ? ?Charges: OT General Charges ?$OT Visit: 1 Visit ?OT Treatments ?$Therapeutic Activity: 8-22 mins ? ?Arman Filter., MPH, MS, OTR/L ?ascom  [phone] ?03/07/22, 1:25 PM ?

## 2022-03-07 NOTE — Progress Notes (Signed)
?PROGRESS NOTE ? ? ?HPI was taken from Dr. Lorenso Courier: ?History of Present Illness: Patient s/p LEFT CEA on 01/25/2022 at our facility secondary to TIA and high grade stenosis. The patient had been well, presented for follow up without issue. Noted on 02/20/2022 she developed sudden swelling, pain and murky drainage from the incision. She was evaluated by urgent care on 02/22/2022 and it was felt she had a wound infection. She was treated with ABX. Last night the patient developed significant bleeding and hypotension to the 70s. Taken by EMS to [facility], digital pressure held, transfused pRBC. Neurologically intact. Initial [facility] by Forestine Na [facility] as the patient was unstable, with active bleeding. That facility and Vascular surgeon did not accept the patient. I accepted the patient for emergent intervention. Upon arrival EMS was holding pressure at neck incision, the patient was awake, alert and mentating and aware of situation. She Understood significant risks of bleeding, stroke and death. Consent obtained. ?  ?As per Dr. Mortimer Fries: ?02/25/22: Admit to ICU s/p left carotid artery repair in the setting of endarterectomy carotid rupture due to left carotid patch infection remained mechanically intubated postop ?02/28/22: Pt underwent left carotid artery stent placement  ?02/28/22: Will perform SBT with plans for possible extubation.  Pt unable to follow commands during [facility] CT Head revealed subacute right MCA stroke as well as recent but older-appearing left MCA strokes x2.  Neurology consulted.  Unable to extubate  ?4/23 MRI brain: Moderate-to-large acute/early subacute right MCA territory infarct within the cortical/subcortical right frontal lobe as well as right insula and subinsular region. Petechial hemorrhage at this site. Local mass effect without significant ventricular ?4/23 NEUROLOGY CONSULTED ? ? ?Veronica Murray  [id number] DOB: 1956-10-07 DOA:  02/24/2022 ?PCP: Patient, No Pcp Per (Inactive) ? ? ?Assessment & Plan: ?  ?Principal Problem: ?  Post-operative state ?Active Problems: ?  Rupture of carotid artery ([place]) ?  CVA (cerebral vascular accident) [facility]) ?  DM2 (diabetes mellitus, type 2) ([place]) ?  Hypokalemia ?  Dysphagia following cerebral infarction ?  Normocytic anemia ?  Leukocytosis ?  Thrombocytosis ?  Hyponatremia ?  Hip pain ? ?Assessment and Plan: ? ?Acute hypoxic & hypercapnic respiratory failure: s/p intubation, ventilation & extubation. Now weaned off o2, breathing comfortably. At high risk for aspiration ? ?Rupture left carotid: from infected graft, now pod 11 from exploration for bleeding and pod 7 from left ICA stent. Stable, vascular surgery following. aspirin and plavix ordered but last received 4/25 (see below). No signs re-bleeding.  ? ?Strep constellatus infection: endovascular infection, ID following, advising at least 6 wks IV abx. Advising continue unasyn for now, switch to ceftriaxone as OPAT, through 5/28 ? ?Hyponatremia: resolved ? ?CVA: MRI brain 4/22 shows multiple small acute/early subacute cortical & subcortical infarcts w/ petechial hemorrhage associated w/ some of these infarcts. Chronic infarcts also noted. Now on aspirin and plavix and statin. Echo shows EF 60-65%, no regional wall motion abnormalities, diastolic function is normal, & atrial septum is grossly normal. Neuro recs apprec, they have signed off. Plan for SNF ? ?Dysphagia: remains at high risk for aspiration. Has self-discontinued NG tube several times. IR declines to place again due to this. Patient agreeable to peg tube but would need to be off asa/plavix 5 days for that. Vascular wants uninterrupted asa/plavix. Vascular has spoken w/ gen surg today who may be able to do an open procedure to place g tube as early  as tomorrow, ok to be on asa/plavix. That gen surg consult is thus pending, will f/u recs. ? ?Hip pain: no fall. Not complaining of pain today. X-ray  4/26 w/o signs fracture ? ?DM2: glucose elevated. Will d/c d51/2ns, start NS, continue SSI q4 ? ?Hypokalemia: KCl repleated IV today, will add 20 meq to fluids ? ?Dysphagia: likely secondary to CVA. NPO. Speech following. Patient and daughter would like to proceed with PEG ? ?Leukocytosis: secondary to above.  ? ?Normocytic anemia: H&H are stable. Will transfuse if Hb < 7.0 ? ? ? ? ? ?DVT prophylaxis: lovenox ?Code Status:  full  ?Family Communication: daughter robin updated telephonically 4/27 ?Disposition Plan: PT recs SNF  ? ?Level of care: Med-Surg ? ?Status is: Inpatient ?Remains inpatient appropriate because: severity of illness ? ? ? ?Consultants:  ?ICU ?Vasc surg  ?Neuro  ? ?Procedures: ? ?Antimicrobials: unasyn  ? ?Subjective: ?No complaints. In chair.  ? ?Objective: ?Vitals:  ? 03/07/22 0600 03/07/22 0800 03/07/22 1000 03/07/22 1200  ?BP: (!) 120/41 123/61 101/73 (!) 124/54  ?Pulse: (!) 28 96 93 90  ?Resp: (!) 28 (!) 27 (!) 24 (!) 35  ?Temp:  98.5 ?F (36.9 ?C)  98.4 ?F (36.9 ?C)  ?TempSrc:  Oral  Oral  ?SpO2: (!) 69% 91% 95% 95%  ?Weight:      ?Height:      ? ? ?Intake/Output Summary (Last 24 hours) at 03/07/2022 1303 ?Last data filed at 03/07/2022 1200 ?Gross per 24 hour  ?Intake 2684.96 ml  ?Output 950 ml  ?Net 1734.96 ml  ? ?Filed Weights  ? 03/05/22 0500 03/06/22 0500 03/07/22 0418  ?Weight: 77.4 kg 75.7 kg 77.8 kg  ? ? ?Examination: ? ?General exam: Appears calm and comfortable  ?Respiratory system: diminished breath sounds b/l ?Cardiovascular system: S1 & S2 +. No  rubs, gallops or clicks.  ?Gastrointestinal system: Abdomen is nondistended, soft and nontender. Normal bowel sounds heard. ?Central nervous system: Lethargic. Would intermittently follow simple commands. Communicates baseline needs ?Psychiatry: Judgement and insight appears not at baseline. Flat mood and affect ? ? ? ?Data Reviewed: I have personally reviewed following labs and imaging studies ? ?CBC: ?Recent Labs  ?Lab 03/02/22 ?0429  03/03/22 ?0414 03/04/22 ?[id number] 03/05/22 ?[id number] 03/06/22 ?[id number]  ?WBC 8.2 12.0* 9.5 14.5* 10.1  ?HGB 9.4* 10.2* 9.5* 9.6* 9.9*  ?HCT 27.9* 29.7* 27.4* 28.5* 29.6*  ?MCV 87.5 86.8 87.0 88.0 87.8  ?PLT 322 431* 418* 531* 604*  ? ?Basic Metabolic Panel: ?Recent Labs  ?Lab 03/03/22 ?0414 03/03/22 ?1348 03/03/22 ?2040 03/04/22 ?[id number] 03/05/22 ?[id number] 03/06/22 ?[id number] 03/06/22 ?2345 03/07/22 ?0435  ?NA 133* 130*  --  133* 133* 135  --  133*  ?K 2.7* 3.1* 3.5 3.9 3.1* 3.5  --  3.0*  ?CL 93* 93*  --  96* 97* 94*  --  97*  ?CO2 34* 32  --  30 28 30   --  28  ?GLUCOSE 129* 81  --  175* 98 139*  --  207*  ?BUN 12 10  --  14 16 13   --  11  ?CREATININE 0.35* 0.39*  --  0.34* 0.39* 0.41* 0.35* 0.36*  ?CALCIUM 8.3* 8.3*  --  8.5* 8.3* 8.5*  --  8.1*  ?MG 1.8  --   --  2.2 2.0 1.9  --  1.9  ?PHOS 4.0 3.6  --  4.3 3.9 3.0  --  3.1  ? ?GFR: ?Estimated Creatinine Clearance: 69.5 mL/min (A) (by C-G formula based on SCr of 0.36  mg/dL (L)). ?Liver Function Tests: ?Recent Labs  ?Lab 03/03/22 ?1348  ?ALBUMIN 2.4*  ? ?No results for input(s): LIPASE, AMYLASE in the last 168 hours. ?No results for input(s): AMMONIA in the last 168 hours. ?Coagulation Profile: ?No results for input(s): INR, PROTIME in the last 168 hours. ?Cardiac Enzymes: ?No results for input(s): CKTOTAL, CKMB, CKMBINDEX, TROPONINI in the last 168 hours. ?BNP (last 3 results) ?No results for input(s): PROBNP in the last 8760 hours. ?HbA1C: ?No results for input(s): HGBA1C in the last 72 hours. ?CBG: ?Recent Labs  ?Lab 03/06/22 ?1915 03/06/22 ?2333 03/07/22 ?[id number] 03/07/22 ?[id number] 03/07/22 ?1109  ?GLUCAP 147* 158* 198* 167* 131*  ? ?Lipid Profile: ?No results for input(s): CHOL, HDL, LDLCALC, TRIG, CHOLHDL, LDLDIRECT in the last 72 hours. ?Thyroid Function Tests: ?No results for input(s): TSH, T4TOTAL, FREET4, T3FREE, THYROIDAB in the last 72 hours. ?Anemia Panel: ?No results for input(s): VITAMINB12, FOLATE, FERRITIN, TIBC, IRON, RETICCTPCT in the last 72 hours. ?Sepsis Labs: ?No results  for input(s): PROCALCITON, LATICACIDVEN in the last 168 hours. ? ? ?Recent Results (from the past 240 hour(s))  ?Culture, blood (Routine X 2) w Reflex to ID Panel     Status: None  ? Collection Time: 04/

## 2022-03-07 NOTE — Consult Note (Signed)
PHARMACY CONSULT NOTE - FOLLOW UP ? ?Pharmacy Consult for Electrolyte Monitoring and Replacement  ? ?Recent Labs: ?Potassium (mmol/L)  ?Date Value  ?03/07/2022 3.0 (L)  ? ?Magnesium (mg/dL)  ?Date Value  ?03/07/2022 1.9  ? ?Calcium (mg/dL)  ?Date Value  ?03/07/2022 8.1 (L)  ? ?Albumin (g/dL)  ?Date Value  ?03/03/2022 2.4 (L)  ? ?Phosphorus (mg/dL)  ?Date Value  ?03/07/2022 3.1  ? ?Sodium (mmol/L)  ?Date Value  ?03/07/2022 133 (L)  ? ?4/27: Corrected calcium 9.4 mg/dL ? ?Assessment: ?66 year old female presenting with ruptured carotic endarterectomy wound. 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 Pharmacy has been consulted to manage electrolytes. ?   ?Off feeding tube since 4/26 1546 ?  ?On D5-NaCl 0.45% infusion @ 159ml/hr from 4/26 1400 ?On Ampicillin-Sulbactam 3g in NaCl 0.9% 100 mL IV Q6h since 4/20. ?  ?Potassium is lower than range, at 3.0 mmol/L at 4/27 0435 ?Magnesium is lower than range, at 1.9 mg/dL at 1/51 7616 ? ?Goal of Therapy:  ?K 4.0 - 5.1 mmol/L ?Mg 2.0 - 2.4 mg/dL ?Other electrolytes WNL ? ?Plan:  ?Kcl 10 mEq x 4 @ 4/27 0900 as scheduled. ?Give Magnesium Sulfate 1g IV @ 4/27 1600. ?Recheck potassium and magnesium. ? ?F/u AM labs. ? ?Vedia Pereyra ,PharmD Candidate [employer]25 ?03/07/2022 8:07 AM  ?

## 2022-03-07 NOTE — Plan of Care (Signed)

## 2022-03-07 NOTE — Plan of Care (Signed)
Neuro: waxes and wanes, follows commands, requires repeating/reinforcing, moves restlessly in bed, asking for pain medication, cannot consistently describe or locate pain ?Resp: stable on room air ?CV: afebrile, vital signs stable ?GIGU: no BM, passing gas frequently, difficulty with purewick due to pt frequently moving in bed, NPO d/t NG tube not tolerated by patient, surgery consulted for gtube placement ?Skin: see flowsheets ?Social: daughter visiting this afternoon, all questions and concerns addressed ? ?Events: Transferred to room 205, report given to [facility]. ? ?Problem: Education: ?Goal: Understanding of CV disease, CV risk reduction, and recovery process will improve ?Outcome: Progressing ?Goal: Individualized Educational Video(s) ?Outcome: Progressing ?  ?Problem: Activity: ?Goal: Ability to return to baseline activity level will improve ?Outcome: Progressing ?  ?Problem: Cardiovascular: ?Goal: Ability to achieve and maintain adequate cardiovascular perfusion will improve ?Outcome: Progressing ?Goal: Vascular access site(s) Level 0-1 will be maintained ?Outcome: Progressing ?  ?Problem: Health Behavior/Discharge Planning: ?Goal: Ability to safely manage health-related needs after discharge will improve ?Outcome: Progressing ?  ?

## 2022-03-07 NOTE — Plan of Care (Signed)
?  Problem: Education: ?Goal: Understanding of CV disease, CV risk reduction, and recovery process will improve ?03/07/2022 2348 by Hortencia Pilar, RN ?Outcome: Progressing ?03/07/2022 2347 by Hortencia Pilar, RN ?Outcome: Progressing ?Goal: Individualized Educational Video(s) ?03/07/2022 2348 by Hortencia Pilar, RN ?Outcome: Progressing ?03/07/2022 2347 by Hortencia Pilar, RN ?Outcome: Progressing ?  ?Problem: Activity: ?Goal: Ability to return to baseline activity level will improve ?03/07/2022 2348 by Hortencia Pilar, RN ?Outcome: Progressing ?03/07/2022 2347 by Hortencia Pilar, RN ?Outcome: Progressing ?  ?Problem: Cardiovascular: ?Goal: Ability to achieve and maintain adequate cardiovascular perfusion will improve ?03/07/2022 2348 by Hortencia Pilar, RN ?Outcome: Progressing ?03/07/2022 2347 by Hortencia Pilar, RN ?Outcome: Progressing ?Goal: Vascular access site(s) Level 0-1 will be maintained ?03/07/2022 2348 by Hortencia Pilar, RN ?Outcome: Progressing ?03/07/2022 2347 by Hortencia Pilar, RN ?Outcome: Progressing ?  ?Problem: Health Behavior/Discharge Planning: ?Goal: Ability to safely manage health-related needs after discharge will improve ?03/07/2022 2348 by Hortencia Pilar, RN ?Outcome: Progressing ?03/07/2022 2347 by Hortencia Pilar, RN ?Outcome: Progressing ?  ?

## 2022-03-07 NOTE — Progress Notes (Signed)
Speech Language Pathology Treatment:    ?Patient Details ?Name: Veronica Murray ?MRN: [id number] ?DOB: 1956-08-16 ?Today's Date: 03/07/2022 ?Time: 1000-1025 ?SLP Time Calculation (min) (ACUTE ONLY): 25 min ? ?Assessment / Plan / Recommendation ?Clinical Impression ? Pt seen for clinical swallowing re-evaluation. Pt alert. R gaze preference noted; however, able to track L of midline to find speaker.  Flat affect. Hoarse, hypophonic, and intermittently wet vocal quality. Wet respirations. Limited spontaneous verbalizations. Reduced attention to task. On room air. Cleared with RN. RN noted pt pulled DHT and may have another one placed today in IR. ?  ?Improving L orofacial weakness. L facial droop persists; however, no drooling noted. Max verbal/tactile cues for L lingual lateralization during oral care and POs. Pt with sparse condition in poor condition. Oral care provided with items from oral care kit in room. Pt performed oral care with max verbal/visual cueing. Additional oral care provided by SLP.  ?  ?Pt given x5 trials of ice chips. Pt with s/sx at least a moderate oral dysphagia c/b oral holding, pocketing in L lateral sulcus, and minimal anterior loss on L. Oral deficits likely due to L orofacial weakness and exacerbated by cognitive-linguistic deficits.  Max verbal/tactile cues needed for eventual A-P transit/clearing from L lateral sulcus. Pharyngeal swallow noted on 4/5 trials with immediate/prolonged/congested cough and wet vocal quality. Absent swallow on 1/5 trials. Further PO trials deferred at this time given severity of s/sx. Of note, with set up and placement of spoon in L hand, pt able to feed self today. ?  ?At present, a safe oral diet cannot be recommended. Recommend NPO with consideration for long term alternate route of nutrition/hydration/medication if in alignment with GOC.  ?  ?Pt is at increased risk for aspiration/aspiration PNA given mental status, dental status, recent/prolonged  intubation with reduced secretion management and aphonic vocal quality, and recent stroke.  ? ?SLP to f/u per POC for clincial swallowing re-evaluation as appropriate. Pt would likely also benefit from cognitive-linguistic evaluation when appropriate.  ?  ?Pt and made aware of results, recommendation, and SLP POC. ?full understanding by pt.  ?  ?HPI HPI: Per 66 H&P "Patient s/p LEFT CEA on 01/25/2022 at our facility secondary to TIA and high grade stenosis. The patient had been well, presented for follow up without issue. Noted on 02/20/2022 she developed sudden swelling, pain and murky drainage from the incision. She was evaluated by urgent care on 02/22/2022 and it was felt she had a wound infection. She was treated with ABX. Last night the patient developed significant bleeding and hypotension to the 70s. Taken by EMS to [facility], digital pressure held, transfused pRBC. Neurologically intact. Initial request by Forestine Na [facility] as the patient was unstable, with active bleeding. That facility and Vascular surgeon did not accept the patient. I accepted the patient for emergent intervention. Upon arrival EMS was holding pressure at neck incision, the patient was awake, alert and mentating and aware of situation. She Understood significant risks of bleeding, stroke and death. Consent obtained.".     CXR on 03/03/2022: while still orally intubated, No acute cardiopulmonary disease identified.  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 Petechial hemorrhage  at this site. Local mass effect without significant ventricular  effacement. No midline shift.     Multiple smaller acute/early subacute cortical and  subcortical  infarcts within the left frontoparietal lobes, posterior left  temporal lobe and lateral  left occipital lobe (left MCA vascular  territory, as well as left MCA/ACA and left MCA/PCA watershed  territories). Petechial hemorrhage associated with some of these  infarcts. 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 ?  ?   ?SLP Plan ? Continue with current plan of care ? ?  ?  ?Recommendations for follow up therapy are one component of a multi-disciplinary discharge planning process, led by the attending physician.  Recommendations may be updated based on patient status, additional functional criteria and insurance authorization. ?  ? ?Recommendations  ?Diet recommendations: NPO (with consideration for PEG) ?Medication Administration: Via alternative means  ?   ?    ?   ? ? ? ? General recommendations:  (Dietician and Palliative Care) ?Oral Care Recommendations: Oral care QID;Staff/trained caregiver to provide oral care ?Follow Up Recommendations: Skilled nursing-short term rehab (<3 hours/day) ?Assistance recommended at discharge: Frequent or constant Supervision/Assistance ?SLP Visit Diagnosis: Dysphagia, oropharyngeal phase (R13.12) ?Plan: Continue with current plan of care ? ? ? ? ?  ?  ? ?Cherrie Gauze, M.S., CCC-SLP ?Speech-Language Pathologist ?[facility] ?[phone] ([place])  ? ?Veronica Murray ? ?03/07/2022, 12:18 PM ?

## 2022-03-07 NOTE — TOC Progression Note (Signed)
Transition of Care (TOC) - Progression Note  ? ? ?Patient Details  ?Name: Veronica Murray ?MRN: [id number] ?Date of Birth: 08/19/1956 ? ?Transition of Care (TOC) CM/SW Contact  ?Candie Chroman, LCSW ?Phone Number: ?03/07/2022, 11:05 AM ? ?Clinical Narrative:   Spoke with daughter about potential for CIR. She will not be able to provide 24/7 supervision at discharge at least for right now. Will continue with plan for SNF placement. Daughter said patient's husband has her debit card and has spent her social security check for the month. Encouraged her to reach out to the bank or APS for assistance. ? ?Expected Discharge Plan: [place] ?Barriers to Discharge: Continued Medical Work up ? ?Expected Discharge Plan and Services ?Expected Discharge Plan: [place] ?  ?Discharge Planning Services: CM Consult ?Post Acute Care Choice: [place] ?Living arrangements for the past 2 months: Hotel/Motel ?                ?DME Arranged: N/A ?DME Agency: NA ?  ?  ?  ?  ?  ?  ?  ?  ? ? ?Social Determinants of Health (SDOH) Interventions ?  ? ?Readmission Risk Interventions ?   ? View : No data to display.  ?  ?  ?  ? ? ?

## 2022-03-07 NOTE — Progress Notes (Signed)
Physical Therapy Treatment ?Patient Details ?Name: Veronica Murray ?MRN: [id number] ?DOB: 1956-01-14 ?Today's Date: 03/07/2022 ? ? ?History of Present Illness 66 yo F transferred emergently to [facility] ED for emergent vascular intervention on a ruptured carotid endarterectomy wound. Intubated on 4/16, extubated on 4/24. 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 Petechial hemorrhage at this site."  Hospital course significant for L neck exploration with surture repair to L carotid (02/24/22) and placement of embolic protective device L carotid (02/28/22) ? ?  ?PT Comments  ? ? Patient alert, motivated today. Voiced several times "I feel great" "that went great" and that she keeps telling herself that she needs to keep working hard. Improved sitting balance today, longer bouts of CGA-very close supervision, and able to perform righting reactions from L and from R. maxAx2 to scoot laterally into the recliner. She was able to sit <> stand twice with RW, maximum multimodal cueing and min-modAx2. Pt did return to sitting spontaneously each time despite education. Pt up in chair with OT at bedside and all needs in reach. The patient would benefit from further skilled PT intervention to continue to progress towards goals. Recommendation remains appropriate.  ?   ?Recommendations for follow up therapy are one component of a multi-disciplinary discharge planning process, led by the attending physician.  Recommendations may be updated based on patient status, additional functional criteria and insurance authorization. ? ?Follow Up Recommendations ? Skilled nursing-short term rehab (<3 hours/day) ?  ?  ?Assistance Recommended at Discharge Frequent or constant Supervision/Assistance  ?Patient can return home with the following Two people to help with walking and/or transfers;Two people to  help with bathing/dressing/bathroom;Help with stairs or ramp for entrance ?  ?Equipment Recommendations ? Other (comment) (TBD)  ?  ?Recommendations for Other Services   ? ? ?  ?Precautions / Restrictions Precautions ?Precautions: Fall ?Restrictions ?Weight Bearing Restrictions: No  ?  ? ?Mobility ? Bed Mobility ?Overal bed mobility: Needs Assistance ?Bed Mobility: Supine to Sit ?  ?  ?Supine to sit: Min assist, Mod assist, HOB elevated ?  ?  ?  ?  ? ?Transfers ?Overall transfer level: Needs assistance ?  ?Transfers: Bed to chair/wheelchair/BSC, Sit to/from Stand ?Sit to Stand: Min assist ?  ?  ?  ?  ? Lateral/Scoot Transfers: Max assist, +2 physical assistance ?General transfer comment: VC for sequencing, pt able to initiate anterior weight shift prior to scoot into recliner and reach for recliner arm ?  ? ?Ambulation/Gait ?  ?  ?  ?  ?  ?  ?  ?  ? ? ?Stairs ?  ?  ?  ?  ?  ? ? ?Wheelchair Mobility ?  ? ?Modified Rankin (Stroke Patients Only) ?  ? ? ?  ?Balance Overall balance assessment: Needs assistance ?Sitting-balance support: No upper extremity supported, Feet supported, Single extremity supported ?Sitting balance-Leahy Scale: Fair ?Sitting balance - Comments: majority of sitting balance fair, CGA -very close supervision throughout. less LOB noted ?  ?  ?  ?  ?  ?  ?  ?  ?  ?  ?  ?  ?  ?  ?  ?  ? ?  ?Cognition Arousal/Alertness: Awake/alert ?Behavior During Therapy: Flat affect, Restless ?Overall Cognitive Status: Impaired/Different from baseline ?Area of Impairment: Orientation, Attention, Memory, Following commands, Safety/judgement, Awareness, Problem solving ?  ?  ?  ?  ?  ?  ?  ?  ?  Orientation Level: Person, Place ?Current Attention Level: Focused ?Memory: Decreased short-term memory ?Following Commands: Follows one step commands with increased time ?Safety/Judgement: Decreased awareness of deficits ?  ?Problem Solving: Slow processing, Decreased initiation, Difficulty sequencing, Requires verbal cues,  Requires tactile cues ?  ?  ?  ? ?  ?Exercises   ? ?  ?General Comments   ?  ?  ? ?Pertinent Vitals/Pain Pain Assessment ?Pain Assessment: No/denies pain  ? ? ?Home Living   ?  ?  ?  ?  ?  ?  ?  ?  ?  ?   ?  ?Prior Function    ?  ?  ?   ? ?PT Goals (current goals can now be found in the care plan section) Progress towards PT goals: Progressing toward goals ? ?  ?Frequency ? ? ? 7X/week ? ? ? ?  ?PT Plan Current plan remains appropriate  ? ? ?Co-evaluation   ?  ?  ?  ?  ? ?  ?AM-PAC PT "6 Clicks" Mobility   ?Outcome Measure ? Help needed turning from your back to your side while in a flat bed without using bedrails?: A Lot ?Help needed moving from lying on your back to sitting on the side of a flat bed without using bedrails?: A Lot ?Help needed moving to and from a bed to a chair (including a wheelchair)?: Total ?Help needed standing up from a chair using your arms (e.g., wheelchair or bedside chair)?: Total ?Help needed to walk in hospital room?: Total ?Help needed climbing 3-5 steps with a railing? : Total ?6 Click Score: 8 ? ?  ?End of Session   ?Activity Tolerance: Patient tolerated treatment well ?Patient left: in chair;with chair alarm set;with call bell/phone within reach;Other (comment) (with OT at bedside) ?Nurse Communication: Mobility status ?PT Visit Diagnosis: Muscle weakness (generalized) (M62.81);Difficulty in walking, not elsewhere classified (R26.2);Hemiplegia and hemiparesis ?Hemiplegia - Right/Left: Left ?Hemiplegia - dominant/non-dominant: Non-dominant ?Hemiplegia - caused by: Cerebral infarction ?  ? ? ?Time: [id number] ?PT Time Calculation (min) (ACUTE ONLY): 31 min ? ?Charges:  $Therapeutic Activity: 23-37 mins          ?          ?Lieutenant Diego PT, DPT ?12:01 PM,03/07/22 ? ? ?

## 2022-03-07 NOTE — Progress Notes (Signed)
[facility] ? ?Daily Progress Note ? ? ?Subjective  -  ? ?Still unable to swallow and has pulled out feeding tubes ?Working with PT and seems to be improving slowly ?No major events overnight ? ?Objective ?Vitals:  ? 03/07/22 0600 03/07/22 0800 03/07/22 1000 03/07/22 1200  ?BP: (!) 120/41 123/61 101/73 (!) 124/54  ?Pulse: (!) 28 96 93 90  ?Resp: (!) 28 (!) 27 (!) 24 (!) 35  ?Temp:  98.5 ?F (36.9 ?C)  98.4 ?F (36.9 ?C)  ?TempSrc:  Oral  Oral  ?SpO2: (!) 69% 91% 95% 95%  ?Weight:      ?Height:      ? ? ?Intake/Output Summary (Last 24 hours) at 03/07/2022 1333 ?Last data filed at 03/07/2022 1200 ?Gross per 24 hour  ?Intake 2584.96 ml  ?Output 950 ml  ?Net 1634.96 ml  ? ? ? ?CV  RRR ?VASC  neck with minimal drainage and swelling ? ?Laboratory ?CBC ?   ?Component Value Date/Time  ? WBC 10.1 03/06/2022 0549  ? HGB 9.9 (L) 03/06/2022 0549  ? HCT 29.6 (L) 03/06/2022 0549  ? PLT 604 (H) 03/06/2022 0549  ? ? ?BMET ?   ?Component Value Date/Time  ? NA 133 (L) 03/07/2022 0435  ? K 3.0 (L) 03/07/2022 0435  ? CL 97 (L) 03/07/2022 0435  ? CO2 28 03/07/2022 0435  ? GLUCOSE 207 (H) 03/07/2022 0435  ? BUN 11 03/07/2022 0435  ? CREATININE 0.36 (L) 03/07/2022 0435  ? CALCIUM 8.1 (L) 03/07/2022 0435  ? GFRNONAA >60 03/07/2022 0435  ? GFRAA >60 06/26/2019 0434  ? ? ?Assessment/Planning: ?POD #7 s/p left carotid stent and postop day #11 status post exploration for bleeding after carotid endarterectomy the month before ? ?His right hemispheric stroke but is improving ?Still unable to swallow and we cannot safely stop dual antiplatelet therapy after recent stroke and carotid intervention ?I have discussed the case with Dr. Everlene Farrier who has agreed to evaluate her for open G-tube placement ?She needs to continue PT and OT ?May need placement at discharge ? ? ?Veronica Murray ? ?03/07/2022, 1:33 PM ? ? ? ?  ?

## 2022-03-07 NOTE — Consult Note (Signed)
Patient ID: Veronica Murray, female   DOB: 1956/05/08, 66 y.o.   MRN: [id number] ? ?HPI ?Veronica Murray is a 66 y.o. female seen in consultation at the request of Dr. Wyn Quaker for gastrostomy tube.  She is 11 days out status post neck exploration for a left carotid blowout following endarterectomy several weeks ago.  This has been presumed from infection of the graft.  She also underwent a left carotid stent 7 days ago and developed a stroke on the contralateral side on the right hemisphere ?The patient has been recovering slowly but still unable to swallow.  Vascular surgery believes that she still needs to be on dual antiplatelet therapy to include aspirin and Plavix. ?She did have a prior history of laparoscopic cholecystectomy and hernia repair in the remote past. ?This afternoon I was able to have a simple conversation with the patient.  She was able to follow commands but there is evidence of neglect from the right hemisphere manifested on the left side. ?She did have an MRI that have personally reviewed showing evidence of subacute right MCA infarct. ?Recent echocardiogram shows preserved ejection fraction with no new wall motion abnormalities. ?She does have a hemoglobin of 9.9, pl 604k creat ok. Alb 1.5 ? ?HPI ? ?Past Medical History:  ?Diagnosis Date  ? Aortic arch atherosclerosis (HCC)   ? Diabetes mellitus without complication (HCC)   ? Essential hypertension   ? Fibromyalgia   ? Tobacco abuse   ? ? ?Past Surgical History:  ?Procedure Laterality Date  ? CAROTID PTA/STENT INTERVENTION Left 01/24/2022  ? Procedure: CAROTID PTA/STENT INTERVENTION;  Surgeon: [name], MD;  Location: [facility];  Service: Cardiovascular;  Laterality: Left;  ? CAROTID PTA/STENT INTERVENTION Left 02/28/2022  ? Procedure: CAROTID PTA/STENT INTERVENTION;  Surgeon: [name], MD;  Location: [facility];  Service: Cardiovascular;  Laterality: Left;  ? CERVICAL BIOPSY    ? CHOLECYSTECTOMY    ? ENDARTERECTOMY  Left 01/25/2022  ? Procedure: ENDARTERECTOMY CAROTID, possible ligation;  Surgeon: [name], MD;  Location: [facility] OR[facility];  Service: Vascular;  Laterality: Left;  ? ENDARTERECTOMY Left 02/25/2022  ? Procedure: ENDARTERECTOMY CAROTID revision;  Surgeon: Bertram Denver, MD;  Location: [facility];  Service: Vascular;  Laterality: Left;  ? HERNIA REPAIR    ? LEFT HEART CATH AND CORONARY ANGIOGRAPHY Left 07/05/2019  ? Procedure: LEFT HEART CATH AND CORONARY ANGIOGRAPHY;  Surgeon: Iran Ouch, MD;  Location: [facility];  Service: Cardiovascular;  Laterality: Left;  ? ? ?Family History  ?Problem Relation Age of Onset  ? CAD Neg Hx   ? ? ?Social History ?Social History  ? ?Tobacco Use  ? Smoking status: Every Day  ?  Packs/day: 1.50  ?  Years: 50.00  ?  Pack years: 75.00  ?  Types: Cigarettes  ? Smokeless tobacco: Never  ?Substance Use Topics  ? Alcohol use: Not Currently  ? Drug use: Yes  ?  Frequency: 7.0 times per week  ?  Types: Marijuana  ? ? ?Allergies  ?Allergen Reactions  ? Codeine Hives, Shortness Of Breath and Nausea Only  ? Toradol [Ketorolac Tromethamine] Hives, Shortness Of Breath and Nausea Only  ? Tramadol Hcl Shortness Of Breath and Nausea Only  ?  "Conflicts with bipolar condition"  ? ? ?Current Facility-Administered Medications  ?Medication Dose Route Frequency Provider Last Rate Last Admin  ? 0.9 %  sodium chloride infusion   Intravenous PRN Erin Fulling, MD  Stopped at 03/07/22 1539  ? 0.9 %  sodium chloride infusion  250 mL Intravenous PRN Esco, Miechia A, MD      ? 0.9 % NaCl with KCl 20 mEq/ L  infusion   Intravenous Continuous Wouk, Wilfred CurtisNoah Bedford, MD 75 mL/hr at 03/07/22 1600 Infusion Verify at 03/07/22 1600  ? acetaminophen (OFIRMEV) IV 1,000 mg  1,000 mg Intravenous Q6H PRN Wouk, Wilfred CurtisNoah Bedford, MD      ? acetaminophen (TYLENOL) tablet 650 mg  650 mg Oral Q4H PRN Esco, Miechia A, MD      ? Ampicillin-Sulbactam (UNASYN) 3 g in sodium chloride 0.9 % 100 mL IVPB  3 g Intravenous Q6H Erin FullingKasa,  Kurian, MD 200 mL/hr at 03/07/22 1853 3 g at 03/07/22 1853  ? aspirin suppository 300 mg  300 mg Rectal Daily Wouk, Wilfred CurtisNoah Bedford, MD      ? calcium gluconate 4 g in sodium chloride 0.9 % 100 mL IVPB  4 g Intravenous Once Manuela SchwartzMorrison, Brenda, NP      ? Chlorhexidine Gluconate Cloth 2 % PADS 6 each  6 each Topical Daily Erin FullingKasa, Kurian, MD   6 each at 03/07/22 1000  ? enoxaparin (LOVENOX) injection 37.5 mg  0.5 mg/kg Subcutaneous Q24H Kathrynn RunningWouk, Noah Bedford, MD   37.5 mg at 03/06/22 2255  ? hydrALAZINE (APRESOLINE) injection 10-20 mg  10-20 mg Intravenous Q6H PRN Rust-Chester, Britton L, NP   20 mg at 03/07/22 0414  ? insulin aspart (novoLOG) injection 0-20 Units  0-20 Units Subcutaneous Q4H Harlon DittyKeene, Jeremiah D, NP   3 Units at 03/07/22 1214  ? labetalol (NORMODYNE) injection 20 mg  20 mg Intravenous Q2H PRN Salena SanerGonzalez, Carmen L, MD   20 mg at 03/04/22 1841  ? magnesium sulfate IVPB 1 g 100 mL  1 g Intravenous Once Lowella BandyGrubb, Rodney D, RPH      ? MEDLINE mouth rinse  15 mL Mouth Rinse BID Erin FullingKasa, Kurian, MD   15 mL at 03/07/22 1000  ? metoCLOPramide (REGLAN) injection 5 mg  5 mg Intravenous Q8H Salena SanerGonzalez, Carmen L, MD   5 mg at 03/07/22 1556  ? ondansetron (ZOFRAN) injection 4 mg  4 mg Intravenous Q6H PRN Esco, Miechia A, MD      ? oxyCODONE-acetaminophen (PERCOCET/ROXICET) 5-325 MG per tablet 1 tablet  1 tablet Per Tube Q6H PRN Rust-Chester, Micheline RoughBritton L, NP      ? pantoprazole sodium (PROTONIX) 40 mg/20 mL oral suspension 40 mg  40 mg Per Tube Daily Salena SanerGonzalez, Carmen L, MD   40 mg at 03/05/22 1018  ? potassium chloride 10 mEq in 100 mL IVPB  10 mEq Intravenous Q1 Hr x 6 Manuela SchwartzMorrison, Brenda, NP      ? rosuvastatin (CRESTOR) tablet 10 mg  10 mg Oral Daily Lowella BandyGrubb, Rodney D, RPH      ? sodium chloride flush (NS) 0.9 % injection 10-40 mL  10-40 mL Intracatheter Q12H Esco, Miechia A, MD   10 mL at 03/07/22 1050  ? sodium chloride flush (NS) 0.9 % injection 10-40 mL  10-40 mL Intracatheter PRN Esco, Miechia A, MD      ? sodium chloride flush (NS) 0.9  % injection 3 mL  3 mL Intravenous Q12H Esco, Miechia A, MD   3 mL at 03/07/22 1050  ? sodium chloride flush (NS) 0.9 % injection 3 mL  3 mL Intravenous PRN Esco, Miechia A, MD      ? ? ? ?Review of Systems ?Full ROS  was asked and was negative except  for the information on the HPI ? ?Physical Exam ?Blood pressure 109/72, pulse 95, temperature 98.4 ?F (36.9 ?C), temperature source Oral, resp. rate 18, height 5\' 2"  (1.575 m), weight 77.8 kg, SpO2 95 %. ?CONSTITUTIONAL: She is in no acute distress.  Debilitated. ?EYES: Pupils are equal, round,  Sclera are non-icteric. ?EARS, NOSE, MOUTH AND THROAT: The oropharynx is clear. The oral mucosa is pink and moist. Hearing is intact to voice. ?LYMPH NODES:  Lymph nodes in the neck are normal. ?RESPIRATORY:  Lungs are clear. There is normal respiratory effort, with equal breath sounds bilaterally, and without pathologic use of accessory muscles. ?CARDIOVASCULAR: Heart is regular without murmurs, gallops, or rubs. ?GI: The abdomen is  soft, nontender, and nondistended. There are no palpable masses. There is no hepatosplenomegaly. There are normal bowel sounds in all quadrants. ?GU: Rectal deferred.   ?MUSCULOSKELETAL: Normal muscle strength and tone. No cyanosis or edema.   ?SKIN: Turgor is good and there are no pathologic skin lesions or ulcers. ?NEUROLOGIC:  ?She is able to raise both upper extremities and lower extremities.  There is evidence of some decrease in the strength in the left upper extremity and more importantly there is neglect of the left side of her body. ?Cranial nerves are grossly intact. ?PSYCH:   Affect is normal.  She is able to follow commands.  Answers very simple questions ? ?Data Reviewed ? ?I have personally reviewed the patient's imaging, laboratory findings and medical records.   ? ?Assessment/Plan ?66 year old female with recent history of a stroke on the right side with significant dysphagia and difficulty with NG tube. ?I do think that given that  the patient is on Plavix the safest thing to do is to do an old-fashioned open gastrostomy tube that will allow this to control the hemostasis in a more predictable fashion.  I had an extensive conversat

## 2022-03-07 NOTE — Progress Notes (Signed)
Nutrition Follow Up Note  ? ?DOCUMENTATION CODES:  ? ?Obesity unspecified ? ?INTERVENTION:  ? ?TPN per pharmacy- plan is to start 4/28 ? ?Recommend thiamine 100mg  daily in TPN x 3 days ? ?Once feeding tube in place, recommend: ? ?Osmolite 1.5@55ml /hr- Initiate at 43ml/hr and increase by 58ml/hr q 8 hours until goal rate is reached.  ? ?Pro-Source 47ml BID via tube, provides 40kcal and 11g of protein per serving   ? ?Free water flushes 7ml q4 hours to maintain tube patency  ? ?Regimen provides 2060kcal/day, 105g/day protein and 1172ml/day of free water  ? ?Pt at high refeed risk; recommend monitor potassium, magnesium and phosphorus labs daily until stable ? ?NUTRITION DIAGNOSIS:  ? ?Inadequate oral intake related to inability to eat (pt sedated and ventilated) as evidenced by NPO status. ?-ongoing ? ?GOAL:  ? ?Patient will meet greater than or equal to 90% of their needs ?-not met  ? ?MONITOR:  ? ?Diet advancement, Weight trends, Labs, Skin, I & O's, Other (Comment) (TPN) ? ?ASSESSMENT:  ? 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 ? ?CT scan from 4/21 reports new MCA infarcts  ? ?Pt re-assessed by SLP this morning who recommended continued NPO. RN previously attempted NGT placement twice and was unable to get the tube in. RD recommend fluoroscopy guided NGT as using the GI system for enteral feeding helps prevent gut mucosal atrophy, reduces septic complications by decreasing bacterial translocation, stimulates gut motility therefore reducing the risk of ileus and enhances the intestinal immune system. Fluoroscopy will not replace NGT as pt removed her previous tube. Pt is unable to have  restraints. Surgery consulted and is planning for G-tube placement 5/2. Plan is to start TPN tomorrow and continue until pt is able to have G-tube placed. Pt is likely at refeed risk. Per chart, pt is weight stable since admission. No BM since 4/23; MD notified.    ? ?Medications reviewed and include: aspirin, lovenox, insulin, reglan, protonix, unasyn, NaCl w/ 5% dextrose @75ml /hr, Mg sulfate ? ?Labs reviewed: Na 133(L), K 3.0(L), creat 0.36(L), P 3.1 wnl, Mg 1.9 wnl ?Hgb 9.9(L), Hct 29.6(L)- 4/26 ?Cbgs- 131, 167, 198 x 24 hrs ? ?Diet Order:   ?Diet Order   ? ?       ?  Diet NPO time specified  Diet effective now       ?  ? ?  ?  ? ?  ? ?EDUCATION NEEDS:  ? ?No education needs have been identified at this time ? ?Skin:  Skin Assessment: Skin Integrity Issues: ?Skin Integrity Issues:: Incisions ?Incisions: closed lt neck ? ?Last BM:  4/23- TYPE 6 ? ?Height:  ? ?Ht Readings from Last 1 Encounters:  ?02/25/22 5\' 2"  (1.575 m)  ? ? ?Weight:  ? ?Wt Readings from Last 1 Encounters:  ?03/07/22 77.8 kg  ? ? ?Ideal Body Weight:  50 kg ? ?BMI:  Body mass index is 31.37 kg/m?. ? ?Estimated Nutritional Needs:  ? ?Kcal:  1800-2100kcal/day ? ?Protein:  90-105g/day ? ?Fluid:  1.5-1.8L/day ? ?Koleen Distance MS, RD, LDN ?Please refer to [facility] for RD and/or RD on-call/weekend/after hours pager ? ?

## 2022-03-08 ENCOUNTER — Inpatient Hospital Stay: Payer: Self-pay

## 2022-03-08 ENCOUNTER — Inpatient Hospital Stay: Payer: Medicare HMO

## 2022-03-08 DIAGNOSIS — T827XXA Infection and inflammatory reaction due to other cardiac and vascular devices, implants and grafts, initial encounter: Secondary | ICD-10-CM | POA: Diagnosis not present

## 2022-03-08 DIAGNOSIS — Z9889 Other specified postprocedural states: Secondary | ICD-10-CM | POA: Diagnosis not present

## 2022-03-08 DIAGNOSIS — I639 Cerebral infarction, unspecified: Secondary | ICD-10-CM | POA: Diagnosis not present

## 2022-03-08 LAB — GASTROINTESTINAL PANEL BY PCR, STOOL (REPLACES STOOL CULTURE)

## 2022-03-08 LAB — COMPREHENSIVE METABOLIC PANEL
ALT: 22 U/L (ref 0–44)
AST: 23 U/L (ref 15–41)
Albumin: 2.6 g/dL — ABNORMAL LOW (ref 3.5–5.0)
Alkaline Phosphatase: 116 U/L (ref 38–126)
Anion gap: 10 (ref 5–15)
BUN: 11 mg/dL (ref 8–23)
CO2: 27 mmol/L (ref 22–32)
Calcium: 8.6 mg/dL — ABNORMAL LOW (ref 8.9–10.3)
Chloride: 98 mmol/L (ref 98–111)
Creatinine, Ser: 0.44 mg/dL (ref 0.44–1.00)
GFR, Estimated: >60 mL/min (ref >60)
Glucose, Bld: 129 mg/dL — ABNORMAL HIGH (ref 70–99)
Potassium: 3.4 mmol/L — ABNORMAL LOW (ref 3.5–5.1)
Sodium: 135 mmol/L (ref 135–145)
Total Bilirubin: 0.8 mg/dL (ref 0.3–1.2)
Total Protein: 6.2 g/dL — ABNORMAL LOW (ref 6.5–8.1)

## 2022-03-08 LAB — URINALYSIS, ROUTINE W REFLEX MICROSCOPIC
Bilirubin Urine: NEGATIVE
Glucose, UA: NEGATIVE mg/dL
Hgb urine dipstick: NEGATIVE
Ketones, ur: 20 mg/dL — AB
Leukocytes,Ua: NEGATIVE
Nitrite: NEGATIVE
Protein, ur: 100 mg/dL — AB
Specific Gravity, Urine: 1.023 (ref 1.005–1.030)
pH: 7 (ref 5.0–8.0)

## 2022-03-08 LAB — TRIGLYCERIDES: Triglycerides: 91 mg/dL (ref <150)

## 2022-03-08 LAB — PROTIME-INR
INR: 1.1 (ref 0.8–1.2)
Prothrombin Time: 14.3 s (ref 11.4–15.2)

## 2022-03-08 LAB — PHOSPHORUS: Phosphorus: 3.7 mg/dL (ref 2.5–4.6)

## 2022-03-08 LAB — GLUCOSE, CAPILLARY
Glucose-Capillary: 108 mg/dL — ABNORMAL HIGH (ref 70–99)
Glucose-Capillary: 151 mg/dL — ABNORMAL HIGH (ref 70–99)

## 2022-03-08 LAB — MAGNESIUM: Magnesium: 1.9 mg/dL (ref 1.7–2.4)
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
## 2022-03-08 MED ORDER — TRAVASOL 10 % IV SOLN
INTRAVENOUS | Status: DC
  Filled 2022-03-08: qty 566.4

## 2022-03-08 MED ORDER — TRAVASOL 10 % IV SOLN
INTRAVENOUS | Status: AC
  Filled 2022-03-08: qty 566.4

## 2022-03-08 MED ORDER — SODIUM CHLORIDE 0.9% FLUSH
10.0000 mL | Freq: Two times a day (BID) | INTRAVENOUS | Status: DC
  Administered 2022-03-08 – 2022-03-15 (×9): 10 mL

## 2022-03-08 MED ORDER — SODIUM CHLORIDE 0.9% FLUSH: 10.0000 mL | INTRAVENOUS | Status: DC | PRN

## 2022-03-08 MED ORDER — SODIUM CHLORIDE 0.9 % IV SOLN: INTRAVENOUS | Status: AC

## 2022-03-08 NOTE — Progress Notes (Signed)
Cross Cover ?Lab follow up per attending request secondary to urinary retention noted on CT.  First labs drawn inaccurate secondary to dilution (drawn from PICC) Repeat labs from peripheral venipuncture with normal renal and hepatic function. Albumin low at 2.5 .  Hemoglobin unchanged at 9.8. No leukocytosis present.  Patient able to spontaneously void 200 cc. Post void residual bladder scan 112. ORN bladder scans ordered.  She may benefit from ditropan therapy.  Wil defer to attending on that.   ?Urinalysis was not collected. ??Due to purewick collection. Requested again with next void ?

## 2022-03-08 NOTE — Progress Notes (Signed)
Peripherally Inserted Central Catheter Placement ? ?The IV Nurse has discussed with the patient and/or persons authorized to consent for the patient, the purpose of this procedure and the potential benefits and risks involved with this procedure.  The benefits include less needle sticks, lab draws from the catheter, and the patient may be discharged home with the catheter. Risks include, but not limited to, infection, bleeding, blood clot (thrombus formation), and puncture of an artery; nerve damage and irregular heartbeat and possibility to perform a PICC exchange if needed/ordered by physician.  Alternatives to this procedure were also discussed.  Bard Power PICC patient education guide, fact sheet on infection prevention and patient information card has been provided to patient /or left at bedside.   ? ?PICC Placement Documentation  ?PICC Triple Lumen 03/08/22 Right Basilic 37 cm 2 cm (Active)  ?Indication for Insertion or Continuance of Line Poor Vasculature-patient has had multiple peripheral attempts or PIVs lasting less than 24 hours 03/08/22 1512  ?Exposed Catheter (cm) 2 cm 03/08/22 1512  ?Site Assessment Clean, Dry, Intact 03/08/22 1512  ?Lumen #1 Status Flushed;Saline locked;Blood return noted 03/08/22 1512  ?Lumen #2 Status Flushed;Saline locked;Blood return noted 03/08/22 1512  ?Lumen #3 Status Flushed;Saline locked;Blood return noted 03/08/22 1512  ?Dressing Type Securing device;Transparent 03/08/22 1512  ?Dressing Status Antimicrobial disc in place 03/08/22 1512  ?Safety Lock Not Applicable 03/08/22 1512  ?Dressing Change Due 03/15/22 03/08/22 1512  ? ? ? ? ? ?Romie Jumper ?03/08/2022, 3:14 PM ? ?

## 2022-03-08 NOTE — Consult Note (Signed)
PHARMACY - TOTAL PARENTERAL NUTRITION CONSULT NOTE  ? ?Indication:  severe disphagia, not tolerating enteral feedings ? ?Patient Measurements: ?Height: 5\' 2"  (157.5 cm) ?Weight: 77.8 kg (171 lb 8.3 oz) ?IBW/kg (Calculated) : 50.1 ?TPN AdjBW (KG): 56.9 ?Body mass index is 31.37 kg/m?. ?Usual Weight: 82.9 kg on 03/04/22 ? ?Assessment: Pharmacy has been consulted to initiate TPN in 65yo patient admitted s/p left CEA on 01/25/22 secondary to TIA and high grade stenosis. Patient had been doing well, but started developing sudden swelling, pain and murky drainage from incision on 02/20/22. 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 Due to left carotid patch infection, patient remained intubated. On 4/20, underwent left carotid artery stent placement. Patient still having dysphagia and at high risk for aspiration. Has self-discontinued NG tube several times. IR has declined to place again d/t this. General surgery planning to place gastrostomy tube this coming Tuesday, 03/12/22. Plan for TPN to start once PICC line is placed. ? ?Glucose / Insulin: BG 131-160 / SSI 0-20 units,  ?Last 24h: 21 units ?Electrolytes: WNL ?Renal: SCR at baseline ?Hepatic: AST/ALT wnl ?Intake / Output; MIVF: +4L / IVF: NS+77mEqK@75ml /h ?GI Imaging:  ?1. Distended urinary bladder to the umbilicus, suspicious for urinary retention. No bladder wall thickening. ?2. Small to moderate left and small right pleural effusions with associated compressive atelectasis. Mild generalized body wall edema ?in the flanks. ?3. Minimal wall thickening of the distal esophagus, can be seen with reflux or esophagitis. ? ?GI Surgeries / Procedures:  ?Plan for G-tube placement on 5/2 ? ?Central access: PICC to be placed 4/28 ?TPN start date: 4/28 pending PICC  ? ?Nutritional Goals: ?Goal TPN rate is 75 mL/hr (provides 106.2 g of protein and 1810.8 kcals per day) ? ?RD Assessment: ?Estimated Needs ?Total Energy Estimated Needs:  1800-2100kcal/day ?Total Protein Estimated Needs: 90-105g/day ?Total Fluid Estimated Needs: 1.5-1.8L/day ? ?Current Nutrition:  ?NPO ? ?Plan:  ?Start TPN at 75mL/hr at 1800 ?Electrolytes in TPN: Na 36mEq/L, K 38mEq/L, Ca 39mEq/L, Mg 52mEq/L, and Phos 67mmol/L. Cl:Ac 1:1 ?Add standard MVI and trace elements to TPN ?Adding thiamine 100mg  x 3 days(D1) ?Nutritional components(once at full rate) ?Amino acids (using Travasol 10%): 106.2 grams ?Dextrose: 270 grams ?Lipids (using 20% SMOFlipids): 46.8 grams ?kCal: 1810.8 ? ?Initiate Resistant q4h SSI and adjust as needed  ?Reduce MIVF to 35 mL/hr at 1800 ?Monitor TPN labs on Mon/Thurs, currently ordered as daily until electrolytes stabilize. ? ?Pearla Dubonnet ?03/08/2022,1:04 PM ? ?

## 2022-03-08 NOTE — Progress Notes (Signed)
[facility] ? ?Daily Progress Note ? ? ?Subjective  -  ? ?Patient is more somnolent and disoriented this morning.  Moved from the ICU to the floor.  Vital signs are all stable.  Now has a sitter ? ?Objective ?Vitals:  ? 03/07/22 1709 03/07/22 1954 03/08/22 0400 03/08/22 0716  ?BP: (!) 154/63 109/72 110/70 125/64  ?Pulse: 88 95 92 82  ?Resp: 19 18 18 20   ?Temp: 99.3 ?F (37.4 ?C) 98.4 ?F (36.9 ?C) 98.7 ?F (37.1 ?C) 97.6 ?F (36.4 ?C)  ?TempSrc:  Oral Oral Oral  ?SpO2: 94% 95% 96% 95%  ?Weight:      ?Height:      ? ? ?Intake/Output Summary (Last 24 hours) at 03/08/2022 1028 ?Last data filed at 03/08/2022 0130 ?Gross per 24 hour  ?Intake 601.36 ml  ?Output --  ?Net 601.36 ml  ? ? ?PULM  CTAB ?CV  RRR ?VASC  neck incision without drainage or hematoma ? ?Laboratory ?CBC ?   ?Component Value Date/Time  ? WBC 7.5 03/07/2022 2047  ? HGB 9.8 (L) 03/07/2022 2047  ? HCT 28.9 (L) 03/07/2022 2047  ? PLT 617 (H) 03/07/2022 2047  ? ? ?BMET ?   ?Component Value Date/Time  ? NA 136 03/07/2022 2047  ? K 3.6 03/07/2022 2047  ? CL 101 03/07/2022 2047  ? CO2 25 03/07/2022 2047  ? GLUCOSE 160 (H) 03/07/2022 2047  ? BUN 11 03/07/2022 2047  ? CREATININE 0.40 (L) 03/07/2022 2047  ? CALCIUM 8.3 (L) 03/07/2022 2047  ? GFRNONAA >60 03/07/2022 2047  ? GFRAA >60 06/26/2019 0434  ? ? ?Assessment/Planning: ? ? ?Left carotid stent over a week ago.  Continue aspirin, Plavix, and statin agent as best we can get it to her.  With her being n.p.o. strictly, we can try to do at least aspirin PR for this point. ?For G-tube early next week for feeding and medication access ?We will likely need PICC line replaced for TPN over the weekend ?Sitters and mittens to try to avoid pulling everything out ?Confusion seems to be progressing but she moves locations last night and is quite disoriented ? ? ?06/28/2019 ? ?03/08/2022, 10:28 AM ? ? ? ?  ?

## 2022-03-08 NOTE — Progress Notes (Signed)
?PROGRESS NOTE ? ? ?HPI was taken from Dr. Lorenso Courier: ?History of Present Illness: Patient s/p LEFT CEA on 01/25/2022 at our facility secondary to TIA and high grade stenosis. The patient had been well, presented for follow up without issue. Noted on 02/20/2022 she developed sudden swelling, pain and murky drainage from the incision. She was evaluated by urgent care on 02/22/2022 and it was felt she had a wound infection. She was treated with ABX. Last night the patient developed significant bleeding and hypotension to the 70s. Taken by EMS to [facility], digital pressure held, transfused pRBC. Neurologically intact. [facility] by Forestine [facility] was [facility], with active bleeding. That facility and Vascular surgeon did not accept the patient. I accepted the patient for emergent intervention. Upon arrival EMS was holding pressure at neck incision, the patient was awake, alert and mentating and aware of situation. She Understood significant risks of bleeding, stroke and death. Consent obtained. ?  ?As per Dr. Mortimer Fries: ?02/25/22: Admit to ICU s/p left carotid artery repair in the setting of endarterectomy carotid rupture due to left carotid patch infection remained mechanically intubated postop ?02/28/22: Pt underwent left carotid artery stent placement  ?02/28/22: Will perform SBT with plans for possible extubation.  Pt unable to follow commands during [facility] CT Head revealed subacute right MCA stroke as well as recent but older-appearing left MCA strokes x2.  Neurology consulted.  Unable to extubate  ?4/23 MRI brain: Moderate-to-large acute/early subacute right MCA territory infarct within the cortical/subcortical right frontal lobe as well as right insula and subinsular region. Petechial hemorrhage at this site. Local mass effect without significant ventricular ?4/23 NEUROLOGY CONSULTED ? ? ?EMON OSUCH  [id number] DOB: 1956-04-20 DOA:  02/24/2022 ?PCP: Patient, No Pcp Per (Inactive) ? ? ?Assessment & Plan: ?  ?Principal Problem: ?  Post-operative state ?Active Problems: ?  Rupture of carotid artery ([place]) ?  CVA (cerebral vascular accident) [facility]) ?  DM2 (diabetes mellitus, type 2) ([place]) ?  Hypokalemia ?  Dysphagia following cerebral infarction ?  Normocytic anemia ?  Leukocytosis ?  Thrombocytosis ?  Hyponatremia ?  Hip pain ? ?Assessment and Plan: ? ?Acute hypoxic & hypercapnic respiratory failure: s/p intubation, ventilation & extubation. Now weaned off o2, breathing comfortably. At high risk for aspiration ? ?Rupture left carotid: from infected graft, now pod 11 from exploration for bleeding and pod 7 from left ICA stent. Stable, vascular surgery following. Not able to take by mouth. Rectal aspirin ordered, will need addition of plavix when tube feeds begin. No signs re-bleeding.  ? ?Strep constellatus infection: endovascular infection, ID following, advising at least 6 wks IV abx. Advising continue unasyn for now, switch to ceftriaxone as OPAT, through 5/28 ? ?Diarrhea? Sitter says small volume diarrhea earlier today. Nursing not aware. Will order gi pathogen panel if indeed further diarrhea. No fever, no leukocytosis on yesterday's labs, holding on c diff testing but may need ? ?Abdominal pain: labs yesterday and CT imaging unremarkable save for distended bladder though patient able to void spontaneously, ua not suggestive of infection, PVR wnl. Will continue to monitor. ? ?Hyponatremia: resolved ? ?CVA: MRI brain 4/22 shows multiple small acute/early subacute cortical & subcortical infarcts w/ petechial hemorrhage associated w/ some of these infarcts. Chronic infarcts also noted. Now on aspirin and plavix and statin. Echo shows EF 60-65%, no regional wall motion abnormalities, diastolic function is normal, & atrial septum is grossly normal.  Neuro recs apprec, they have signed off. Plan for SNF. Today more delirious, will repeat CT of  head. ? ?Dysphagia: remains at high risk for aspiration. Has self-discontinued NG tube several times. IR declines to place again due to this. Patient agreeable to peg tube but would need to be off asa/plavix 5 days for that. Vascular wants uninterrupted antiplatelet. Vascular has spoken w/ gen surg, plan is open g tube placement on 5/2. TPN has been ordered, will start today after placement of picc ? ?Hip pain: no fall. Not complaining of pain today. X-ray 4/26 w/o signs fracture ? ?DM2: glucose has normalized with stopping d5. ? ?Hypokalemia: monitor and replace as needed ? ?Normocytic anemia: H&H are stable. Will transfuse if Hb < 7.0 ? ? ? ? ? ?DVT prophylaxis: lovenox ?Code Status:  full  ?Family Communication: daughter robin updated telephonically 4/27, no answer when called today ?Disposition Plan: PT recs SNF  ? ?Level of care: Med-Surg ? ?Status is: Inpatient ?Remains inpatient appropriate because: severity of illness ? ? ? ?Consultants:  ?ICU ?Vasc surg  ?Neuro  ? ?Procedures: ? ?Antimicrobials: unasyn  ? ?Subjective: ?No complaints. Complaining of lower abd pain. No vomiting. Sitter says small volume possible diarrhea earlier today. No fevers. ? ?Objective: ?Vitals:  ? 03/07/22 1709 03/07/22 1954 03/08/22 0400 03/08/22 0716  ?BP: (!) 154/63 109/72 110/70 125/64  ?Pulse: 88 95 92 82  ?Resp: 19 18 18 20   ?Temp: 99.3 ?F (37.4 ?C) 98.4 ?F (36.9 ?C) 98.7 ?F (37.1 ?C) 97.6 ?F (36.4 ?C)  ?TempSrc:  Oral Oral Oral  ?SpO2: 94% 95% 96% 95%  ?Weight:      ?Height:      ? ? ?Intake/Output Summary (Last 24 hours) at 03/08/2022 1303 ?Last data filed at 03/08/2022 0130 ?Gross per 24 hour  ?Intake 318.83 ml  ?Output --  ?Net 318.83 ml  ? ?Filed Weights  ? 03/05/22 0500 03/06/22 0500 03/07/22 0418  ?Weight: 77.4 kg 75.7 kg 77.8 kg  ? ? ?Examination: ? ?General exam: Appears calm and comfortable  ?Respiratory system: diminished breath sounds b/l ?Cardiovascular system: S1 & S2 +. No  rubs, gallops or clicks.   ?Gastrointestinal system: Abdomen is nondistended, soft and nontender. Normal bowel sounds heard. ?Central nervous system: Lethargic. Would intermittently follow simple commands. Communicates baseline needs ?Psychiatry: Judgement and insight appears not at baseline. Flat mood and affect ? ? ? ?Data Reviewed: I have personally reviewed following labs and imaging studies ? ?CBC: ?Recent Labs  ?Lab 03/04/22 ?[id number] 03/05/22 ?[id number] 03/06/22 ?[id number] 03/07/22 ?1859 03/07/22 ?2047  ?WBC 9.5 14.5* 10.1 5.6 7.5  ?NEUTROABS  --   --   --  3.3 4.6  ?HGB 9.5* 9.6* 9.9* 7.0* 9.8*  ?HCT 27.4* 28.5* 29.6* 20.6* 28.9*  ?MCV 87.0 88.0 87.8 88.0 86.3  ?PLT 418* 531* 604* 437* 617*  ? ?Basic Metabolic Panel: ?Recent Labs  ?Lab 03/03/22 ?0414 03/03/22 ?1348 03/03/22 ?2040 03/04/22 ?[id number] 03/05/22 ?[id number] 03/06/22 ?[id number] 03/06/22 ?2345 03/07/22 ?0435 03/07/22 ?2047  ?NA 133* 130*  --  133* 133* 135  --  133* 136  ?K 2.7* 3.1*   < > 3.9 3.1* 3.5  --  3.0* 3.6  ?CL 93* 93*  --  96* 97* 94*  --  97* 101  ?CO2 34* 32  --  30 28 30   --  28 25  ?GLUCOSE 129* 81  --  175* 98 139*  --  207* 160*  ?BUN 12 10  --  14 16 13   --  11 11  ?CREATININE 0.35* 0.39*  --  0.34* 0.39* 0.41* 0.35* 0.36* 0.40*  ?CALCIUM 8.3* 8.3*  --  8.5* 8.3* 8.5*  --  8.1* 8.3*  ?MG 1.8  --   --  2.2 2.0 1.9  --  1.9  --   ?PHOS 4.0 3.6  --  4.3 3.9 3.0  --  3.1  --   ? < > = values in this interval not displayed.  ? ?GFR: ?Estimated Creatinine Clearance: 69.5 mL/min (A) (by C-G formula based on SCr of 0.4 mg/dL (L)). ?Liver Function Tests: ?Recent Labs  ?Lab 03/03/22 ?1348 03/07/22 ?2047  ?AST  --  22  ?ALT  --  24  ?ALKPHOS  --  115  ?BILITOT  --  0.8  ?PROT  --  5.9*  ?ALBUMIN 2.4* 2.5*  ? ?No results for input(s): LIPASE, AMYLASE in the last 168 hours. ?No results for input(s): AMMONIA in the last 168 hours. ?Coagulation Profile: ?No results for input(s): INR, PROTIME in the last 168 hours. ?Cardiac Enzymes: ?No results for input(s): CKTOTAL, CKMB, CKMBINDEX, TROPONINI in the  last 168 hours. ?BNP (last 3 results) ?No results for input(s): PROBNP in the last 8760 hours. ?HbA1C: ?No results for input(s): HGBA1C in the last 72 hours. ?CBG: ?Recent Labs  ?Lab 03/07/22 ?1538 03/07/22 ?2049 03/07/22 ?2

## 2022-03-08 NOTE — Progress Notes (Signed)
? ?  Date of Admission:  02/24/2022    ?ID: Veronica Murray is a 66 y.o. female  ?Principal Problem: ?  Post-operative state ?Active Problems: ?  Rupture of carotid artery ([place]) ?  CVA (cerebral vascular accident) [facility]) ?  DM2 (diabetes mellitus, type 2) ([place]) ?  Hypokalemia ?  Dysphagia following cerebral infarction ?  Normocytic anemia ?  Leukocytosis ?  Thrombocytosis ?  Hyponatremia ?  Hip pain ? ? ? ?Subjective: ?Oit of ICU ?Some confusion ?agitation ? ?Medications:  ? aspirin  300 mg Rectal Daily  ? Chlorhexidine Gluconate Cloth  6 each Topical Daily  ? enoxaparin (LOVENOX) injection  0.5 mg/kg Subcutaneous Q24H  ? insulin aspart  0-20 Units Subcutaneous Q4H  ? mouth rinse  15 mL Mouth Rinse BID  ? metoCLOPramide (REGLAN) injection  5 mg Intravenous Q8H  ? pantoprazole sodium  40 mg Per Tube Daily  ? rosuvastatin  10 mg Oral Daily  ? sodium chloride flush  10-40 mL Intracatheter Q12H  ? sodium chloride flush  3 mL Intravenous Q12H  ? ? ?Objective: ?Vital signs in last 24 hours: ?Temp:  [97.6 ?F (36.4 ?C)-99.3 ?F (37.4 ?C)] 97.6 ?F (36.4 ?C) (04/28 [id number]) ?Pulse Rate:  [82-95] 82 (04/28 0716) ?Resp:  [18-35] 20 (04/28 0716) ?BP: (109-154)/(53-72) 125/64 (04/28 0716) ?SpO2:  [94 %-96 %] 95 % (04/28 0716) ? ?PHYSICAL EXAM:  ?General: awake, follows commands ? ?Lungs:b/la ir entry. ?Heart: s1s2. ?Abdomen: Soft, non-tender,not distended. Bowel sounds normal. No masses ?Extremities: left PICc ?Skin: No rashes or lesions. Or bruising ?Lymph: Cervical, supraclavicular normal. ?Neurologic: rt eye ptosis ?Healing wound left side of neck-  ? ?Lab Results ?Recent Labs  ?  03/07/22 ?0435 03/07/22 ?1859 03/07/22 ?2047  ?WBC  --  5.6 7.5  ?HGB  --  7.0* 9.8*  ?HCT  --  20.6* 28.9*  ?NA 133*  --  136  ?K 3.0*  --  3.6  ?CL 97*  --  101  ?CO2 28  --  25  ?BUN 11  --  11  ?CREATININE 0.36*  --  0.40*  ? ?Microbiology: ?Wound culture strep constellatus ?Blood culture NG ? ? ?Assessment/Plan: ?Severe carotid atheroscleoric disease  on the left with amourasis fugax needed endarterectomy and matric patch on 01/25/22 ?  ?Infected graft with blow out and hemorrhage ?Patch removal and repair on 4/17 ?Stent placement on 4/20 ?  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 ?Acute RT MCA infarct ?  ?Anemia has received PRBC multiple units ?  ?DM on insulin ?  ?Discussed the management with her nurse ? ID will follow her peripherally - call if needed ? ?

## 2022-03-08 NOTE — Progress Notes (Signed)
PT Cancellation Note ? ?Patient Details ?Name: Veronica Murray ?MRN: [id number] ?DOB: Mar 29, 1956 ? ? ?Cancelled Treatment:     Attempted x 2 today, Pt remains in room receiving sterile procedure.  Will try to return if time permits. Continue PT per POC. ? ? ?Jannet Askew ?03/08/2022, 3:06 PM ?

## 2022-03-08 NOTE — Progress Notes (Signed)
Nutrition Follow-up ? ?DOCUMENTATION CODES:  ? ?Obesity unspecified ? ?INTERVENTION:  ? ?TPN per pharmacy ?  ?Recommend thiamine 100mg  daily in TPN x 3 days ?  ?Once feeding tube in place, recommend: ?  ?Osmolite 1.5@55ml /hr- Initiate at 68ml/hr and increase by 25ml/hr q 8 hours until goal rate is reached.  ?  ?Pro-Source 3ml BID via tube, provides 40kcal and 11g of protein per serving   ?  ?Free water flushes 9ml q4 hours to maintain tube patency  ?  ?Regimen provides 2060kcal/day, 105g/day protein and 1174ml/day of free water  ?  ?Pt at high refeed risk; recommend monitor potassium, magnesium and phosphorus labs daily until stable ? ?NUTRITION DIAGNOSIS:  ? ?Inadequate oral intake related to inability to eat (pt sedated and ventilated) as evidenced by NPO status. ? ?Ongoing ? ?GOAL:  ? ?Patient will meet greater than or equal to 90% of their needs ? ?Progressing ? ?MONITOR:  ? ?Diet advancement, Weight trends, Labs, Skin, I & O's, Other (Comment) (TPN) ? ?REASON FOR ASSESSMENT:  ? ?Malnutrition Screening Tool, Ventilator ?  ? ?ASSESSMENT:  ? 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 ? ?4/21- CT reveals new MCA infarcts ?4/27- s/p BSE- recommend continue NPO ? ?Pt remains NPO. RN previously attempted NGT placement at bedside unsuccessfully. RD recommended fluoroscopy guided NGT placement, however, fluoroscopy did not replace as pt removed previous NGT. Enteral nutrition is preferred route of nutrition access; using the GI system for enteral feeding helps prevent gut mucosal atrophy, reduces septic complications by decreasing bacterial translocation, stimulates gut motility therefore reducing the  risk of ileus and enhances the intestinal immune system. General surgery plans to place g-tube on 03/12/22. Plan to initiate TPN until g-tube can be placed.  ? ?Case discussed with pharmacist and MD. Plan for PICC placement today to start TPN. Noted that pt has been agitated and pulled out her IV.  ? ?Per pharmacy note, plan to initiate TPN at 40 ml/hr. Regimen to provide 966 kcals and 57 grams protein, which provides 53% of estimated kcal needs and 63% of estimated protein needs.  ? ?Medications reviewed and include reglan.  ? ?Labs reviewed: K: 3.4. CBGS: 108-151 (inpatient orders for glycemic control are 0-20 units insulin aspart every 4 hours).   ? ?Diet Order:   ?Diet Order   ? ?       ?  Diet NPO time specified  Diet effective now       ?  ? ?  ?  ? ?  ? ? ?EDUCATION NEEDS:  ? ?No education needs have been identified at this time ? ?Skin:  Skin Assessment: Skin Integrity Issues: ?Skin Integrity Issues:: Incisions ?Incisions: closed lt neck ? ?Last BM:  03/08/22 (type 7) ? ?Height:  ? ?Ht Readings from Last 1 Encounters:  ?02/25/22 5\' 2"  (1.575 m)  ? ? ?Weight:  ? ?Wt Readings from Last 1 Encounters:  ?03/07/22 77.8 kg  ? ? ?Ideal Body Weight:  50 kg ? ?BMI:  Body mass index is 31.37 kg/m?. ? ?Estimated Nutritional Needs:  ? ?Kcal:  1800-2100kcal/day ? ?Protein:  90-105g/day ? ?Fluid:  1.5-1.8L/day ? ? ? ?Loistine Chance, RD, LDN, CDCES ?Registered Dietitian II ?Certified Diabetes Care and Education Specialist ?  Please refer to [facility] for RD and/or RD on-call/weekend/after hours pager  ?

## 2022-03-09 DIAGNOSIS — Z9889 Other specified postprocedural states: Secondary | ICD-10-CM | POA: Diagnosis not present

## 2022-03-09 LAB — CBC
HCT: 26.9 % — ABNORMAL LOW (ref 36.0–46.0)
Hemoglobin: 9 g/dL — ABNORMAL LOW (ref 12.0–15.0)
MCH: 29.4 pg (ref 26.0–34.0)
MCHC: 33.5 g/dL (ref 30.0–36.0)
MCV: 87.9 fL (ref 80.0–100.0)
Platelets: 599 10*3/uL — ABNORMAL HIGH (ref 150–400)
RBC: 3.06 MIL/uL — ABNORMAL LOW (ref 3.87–5.11)
RDW: 13 % (ref 11.5–15.5)
WBC: 5.4 10*3/uL (ref 4.0–10.5)
nRBC: 0 % (ref 0.0–0.2)

## 2022-03-09 LAB — COMPREHENSIVE METABOLIC PANEL
ALT: 19 U/L (ref 0–44)
AST: 18 U/L (ref 15–41)
Albumin: 2.4 g/dL — ABNORMAL LOW (ref 3.5–5.0)
Alkaline Phosphatase: 100 U/L (ref 38–126)
Anion gap: 8 (ref 5–15)
BUN: 13 mg/dL (ref 8–23)
CO2: 28 mmol/L (ref 22–32)
Calcium: 8.2 mg/dL — ABNORMAL LOW (ref 8.9–10.3)
Chloride: 98 mmol/L (ref 98–111)
Creatinine, Ser: 0.36 mg/dL — ABNORMAL LOW (ref 0.44–1.00)
GFR, Estimated: >60 mL/min (ref >60)
Glucose, Bld: 218 mg/dL — ABNORMAL HIGH (ref 70–99)
Potassium: 3.1 mmol/L — ABNORMAL LOW (ref 3.5–5.1)
Sodium: 134 mmol/L — ABNORMAL LOW (ref 135–145)
Total Bilirubin: 0.5 mg/dL (ref 0.3–1.2)
Total Protein: 5.7 g/dL — ABNORMAL LOW (ref 6.5–8.1)

## 2022-03-09 LAB — MAGNESIUM: Magnesium: 1.9 mg/dL (ref 1.7–2.4)

## 2022-03-09 LAB — GLUCOSE, CAPILLARY
Glucose-Capillary: 130 mg/dL — ABNORMAL HIGH (ref 70–99)
Glucose-Capillary: 213 mg/dL — ABNORMAL HIGH (ref 70–99)
Glucose-Capillary: 225 mg/dL — ABNORMAL HIGH (ref 70–99)
Glucose-Capillary: 233 mg/dL — ABNORMAL HIGH (ref 70–99)
Glucose-Capillary: 203 mg/dL — ABNORMAL HIGH (ref 70–99)
Glucose-Capillary: 166 mg/dL — ABNORMAL HIGH (ref 70–99)

## 2022-03-09 LAB — TRIGLYCERIDES: Triglycerides: 105 mg/dL (ref <150)

## 2022-03-09 LAB — PHOSPHORUS: Phosphorus: 3.5 mg/dL (ref 2.5–4.6)

## 2022-03-09 MED ORDER — TRAVASOL 10 % IV SOLN
INTRAVENOUS | Status: DC
  Filled 2022-03-09: qty 1062

## 2022-03-09 MED ORDER — POTASSIUM CHLORIDE 10 MEQ/100ML IV SOLN
10.0000 meq | INTRAVENOUS | Status: AC
  Administered 2022-03-09 (×2): 10 meq via INTRAVENOUS
  Filled 2022-03-09 (×2): qty 100

## 2022-03-09 MED ORDER — INSULIN GLARGINE-YFGN 100 UNIT/ML ~~LOC~~ SOLN
5.0000 [IU] | Freq: Every day | SUBCUTANEOUS | Status: DC
  Administered 2022-03-09: 5 [IU] via SUBCUTANEOUS
  Filled 2022-03-09: qty 0.05

## 2022-03-09 MED ORDER — POTASSIUM CHLORIDE 10 MEQ/100ML IV SOLN
10.0000 meq | INTRAVENOUS | Status: AC
  Administered 2022-03-09 (×3): 10 meq via INTRAVENOUS
  Filled 2022-03-09 (×3): qty 100

## 2022-03-09 MED ORDER — TRAVASOL 10 % IV SOLN
INTRAVENOUS | Status: AC
  Filled 2022-03-09: qty 1062

## 2022-03-09 NOTE — Progress Notes (Signed)
Inpatient Rehab Admissions Coordinator:  ? ?Per therapy recommendations,  patient was screened for CIR candidacy by Chablis Losh, MS, CCC-SLP. At this time, Pt. Appears to be a a potential candidate for CIR. I will place   order for rehab consult per protocol for full assessment. Please contact me any with questions. ? ?Leza Apsey, MS, CCC-SLP ?Rehab Admissions Coordinator  ?[phone] (celll) ?[phone] (office) ? ?

## 2022-03-09 NOTE — Consult Note (Signed)
PHARMACY - TOTAL PARENTERAL NUTRITION CONSULT NOTE  ? ?Indication:  severe disphagia, not tolerating enteral feedings ? ?Patient Measurements: ?Height: 5\' 2"  (157.5 cm) ?Weight: 74.7 kg (164 lb 10.9 oz) ?IBW/kg (Calculated) : 50.1 ?TPN AdjBW (KG): 56.9 ?Body mass index is 30.12 kg/m?. ?Usual Weight: 82.9 kg on 03/04/22 ? ?Assessment: Pharmacy has been consulted to initiate TPN in 66yo patient admitted s/p left CEA on 01/25/22 secondary to TIA and high grade stenosis. Patient had been doing well, but started developing sudden swelling, pain and murky drainage from incision on 02/20/22. 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 Due to left carotid patch infection, patient remained intubated. On 4/20, underwent left carotid artery stent placement. Patient still having dysphagia and at high risk for aspiration. Has self-discontinued NG tube several times. IR has declined to place again d/t this. General surgery planning to place gastrostomy tube this coming Tuesday, 03/12/22. Plan for TPN to start once PICC line is placed. ? ?Glucose / Insulin: BG 130-225 / SSI 0-20 units,  ?Last 24h: 10 units ?Electrolytes: K 3.1- KCL replaced ?Renal: SCR at baseline ?Hepatic: AST/ALT wnl ?Intake / Output; MIVF:  IVF: NS@35ml /h ?GI Imaging:  ?1. Distended urinary bladder to the umbilicus, suspicious for urinary retention. No bladder wall thickening. ?2. Small to moderate left and small right pleural effusions with associated compressive atelectasis. Mild generalized body wall edema ?in the flanks. ?3. Minimal wall thickening of the distal esophagus, can be seen with reflux or esophagitis. ? ?GI Surgeries / Procedures:  ?Plan for G-tube placement on 5/2 ? ?Central access: PICC to be placed 4/28 ?TPN start date: 4/28 pending PICC  ? ?Nutritional Goals: ?Goal TPN rate is 75 mL/hr (provides 106.2 g of protein and 1810.8 kcals per day) ? ?RD Assessment: ?Estimated Needs ?Total Energy Estimated Needs:  1800-2100kcal/day ?Total Protein Estimated Needs: 90-105g/day ?Total Fluid Estimated Needs: 1.5-1.8L/day ? ?Current Nutrition:  ?NPO ? ?Plan:  ?Increase TPN to goal rate of 75 mL/hr at 1800 ?Electrolytes in TPN: Na 55mEq/L, K 16mEq/L, Ca 35mEq/L, Mg 75mEq/L, and Phos 80mmol/L. Cl:Ac 1:1 ?K 3.1  Ordered KCL 10 meq IV x 3 doses. Will follow with anticipated increase in TPN rate. ?Add standard MVI and trace elements to TPN ?Adding thiamine 100mg  x 3 days(D2) ?Nutritional components(once at full rate) ?Amino acids (using Travasol 10%): 106.2 grams ?Dextrose: 270 grams ?Lipids (using 20% SMOFlipids): 46.8 grams ?kCal: 1810.8 ? ?Initiate Resistant q4h SSI and adjust as needed  ?Discontinue MIVF at 1800 ?Monitor TPN labs on Mon/Thurs, currently ordered as daily until electrolytes stabilize. ? ?Phyillis Dascoli A ?03/09/2022,9:58 AM ? ?

## 2022-03-09 NOTE — Progress Notes (Signed)
Speech Language Pathology Treatment: Dysphagia  ?Patient Details ?Name: Veronica Murray ?MRN: [id number] ?DOB: August 24, 1956 ?Today's Date: 03/09/2022 ?Time: [id number] ?SLP Time Calculation (min) (ACUTE ONLY): 60 min ? ?Assessment / Plan / Recommendation ?Clinical Impression ? Pt seen for ongoing clinical swallowing assessment; appropriateness for oral intake. Pt appears to be improving in her engagement and attention to her environment. She was alert, sitting in chair w/ 2 Daughters present. Noted verbal responses/engagement w/ emotion but w/ continued low volume and mumbled speech at times. She was mildly fidgity in the chair and sliding down needing verbal/tactile cues to self-correct. Sat on pt's Left side to engage Left side awareness. Pt able to engage in self-feeding using both R/LUEs. Pt on RA now.  ?  ?Pt MUCH improved oropharyngeal secretions -- NO drooling noted and NO expectorated secretions into the towel or yaunker use as noted at previous session. Noted oral secretion management clear vocal quality at rest.  ?  ?Min-Mod Left labial-facial weakness and reduced Left orofacial/labial sensation appreciated during OM exs and po tasks but MUCH improved.   ?  ?Post oral care which pt held swabs and engaged w/ light "brushing" of teeth again(further oral care delivered by this Clinician), pt was given trials of Single ice chips x10; 3 trials of thin liquids via Cup; 8 trials of puree and ~2-3 ozs of Nectar liquids. Pt exhibited s/s of oral phase dysphagia c/b oral holding and delayed A-P transfer w/ trials but eventually cleared/swallowed w/ verbal/visual cues. Noted this behavior moreso when distracted. Labial closure and oral-lingual movements and bolus manipulation were adequate, MUCH improved. ?Pharyngeal swallowing was appreciated w/ all trials; suspect delayed swallow initiation moreso w/ thin liquids as evidenced by immediate and delayed Coughing. Less occurrence of audible swallows, multiple swallows,  disorganized transfer and swallowing, and a tongue forward position during the swallows this session, MUCH improved. NO coughing occurred w/ Nectar liquids and puree trials; delayed cough noted w/ ice chips x1-2.  ?  ?W/ pt's current presentation, and MUCH improved swallowing status, MD consulted for a trial initiation of a Dysphagia level 1 (puree) diet w/ Nectar liquids. Pt is at increased risk for aspiration/aspiration pneumonia given degree of CVA/infarcts (see MRI), mental status, dental status, recent/prolonged intubation, but she is demonstrating improved sensorimotor swallowing function and toleration of po trials w/ Supervision and cues. MD agreed w/ POC.   ? ?Recommend a Dysphagia level 1 w/ Nectar liquids via Cup; strict aspiration precautions and Supervision during all oral intake - monitor for swallow and oral clearing b/t bites/sips; reduce distractions during oral intake. Stop po's if increased s/s of aspiration noted.  ?ST services to continue to follow for Dysphagia tx per POC for ongoing clinical swallowing assessment in hopes to continue to upgrade diet consistency. Dietician following and will monitor oral intake for nutrition/hydration needs along w/ Team. Pt will also benefit from cognitive-linguistic evaluation d/t recent CVAs when appropriate. OF NOTE: Daughters mentioned there may have been some Cognitive "changes" ongoing at home prior to CVAs. ?The above was discussed w/ MD/NSG/Daughters who agreed.  ? ? ?  ?HPI HPI: Per 57 H&P "Patient s/p LEFT CEA on 01/25/2022 at our facility secondary to TIA and high grade stenosis. The patient had been well, presented for follow up without issue. Noted on 02/20/2022 she developed sudden swelling, pain and murky drainage from the incision. She was evaluated by urgent care on 02/22/2022 and it was felt she had a wound infection. She was treated with ABX. Last night the  patient developed significant bleeding and hypotension to the 70s. Taken by EMS  to [facility], digital pressure held, transfused pRBC. Neurologically intact. [facility] by Forestine [facility], with active bleeding. That facility and Vascular surgeon did not accept the patient. I accepted the patient for emergent intervention. Upon arrival EMS was holding pressure at neck incision, the patient was awake, alert and mentating and aware of situation. She Understood significant risks of bleeding, stroke and death. Consent obtained.".     CXR on 03/03/2022: while still orally intubated, No acute cardiopulmonary disease identified.  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 Petechial hemorrhage  at this site. Local mass effect without significant ventricular  effacement. No midline shift.     Multiple smaller acute/early subacute cortical and subcortical  infarcts within the left frontoparietal lobes, posterior left  temporal lobe and lateral left occipital lobe (left MCA vascular  territory, as well as left MCA/ACA and left MCA/PCA watershed  territories). Petechial hemorrhage associated with some of these  infarcts. 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 ?  ?   ?SLP Plan ? Continue with current plan of care ? ?  ?  ?Recommendations for follow up therapy are one component of a multi-disciplinary discharge planning process, led by the attending physician.  Recommendations may be updated based on patient status, additional functional criteria and insurance authorization. ?  ? ?Recommendations  ?Diet recommendations: Dysphagia 1 (puree);Nectar-thick liquid ?Liquids provided via: Cup;No straw ?Medication Administration: Crushed with puree ?Supervision: Patient able to self feed;Full  supervision/cueing for compensatory strategies ?Compensations: Minimize environmental distractions;Slow rate;Small sips/bites;Lingual sweep for clearance of pocketing;Follow solids with liquid ?Postural Changes and/or Swallow Maneuvers: Out of bed for meals;Seated upright 90 degrees;Upright 30-60 min after meal  ?   ?    ?   ? ? ? ? General recommendations:  (Dietician f/u) ?Oral Care Recommendations: Oral care BID;Oral care before and after PO;Patient independent with oral care;Staff/trained caregiver to provide oral care (support) ?Follow Up Recommendations: Skilled nursing-short term rehab (<3 hours/day) ?Assistance recommended at discharge: Frequent or constant Supervision/Assistance ?SLP Visit Diagnosis: Dysphagia, oropharyngeal phase (R13.12) ?Plan: Continue with current plan of care ? ? ? ? ?  ?  ? ? ? ? ?Orinda Kenner, MS, CCC-SLP ?Speech Language Pathologist ?Rehab Services; [place] ?[phone] (ascom) ?Birdella Sippel ? ?03/09/2022, 1:26 PM ?

## 2022-03-09 NOTE — Progress Notes (Signed)
Physical Therapy Treatment ?Patient Details ?Name: Veronica Murray ?MRN: [id number] ?DOB: August 22, 1956 ?Today's Date: 03/09/2022 ? ? ?History of Present Illness 66 yo F transferred emergently to [facility] ED for emergent vascular intervention on a ruptured carotid endarterectomy wound. Intubated on 4/16, extubated on 4/24. 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 Petechial hemorrhage at this site."  Hospital course significant for L neck exploration with surture repair to L carotid (02/24/22) and placement of embolic protective device L carotid (02/28/22) ? ?  ?PT Comments  ? ? Pt seen for PT tx with pt agreeable & pt's daughter Efraim Kaufmann) present for session. Pt with low verbal communication during session but daughter says her volume is improving. Pt is able to complete STS with LUE HHA or no UE HHA with min assist. Pt ambulates into hallway with LUE HHA & min<>mod assist with decreased gait speed & slight L lateral lean, pt's daughter assisting with managing IV pole. Pt requires frequent cuing to reach for objects/rails for support with RW. Educated pt on CIR level of therapies & pt states "that would be great!". Pt is a great CIR candidate as she would benefit from intensive multidisciplinary level of therapies to help facilitate return to independent PLOF. Will continue to follow pt acutely to address balance, endurance, and gait with LRAD. ? ?   ?Recommendations for follow up therapy are one component of a multi-disciplinary discharge planning process, led by the attending physician.  Recommendations may be updated based on patient status, additional functional criteria and insurance authorization. ? ?Follow Up Recommendations ? Acute inpatient rehab (3hours/day) ?  ?  ?Assistance Recommended at Discharge Frequent or constant Supervision/Assistance  ?Patient can return  home with the following A lot of help with walking and/or transfers;A lot of help with bathing/dressing/bathroom;Assistance with cooking/housework;Direct supervision/assist for financial management;Assist for transportation;Direct supervision/assist for medications management;Help with stairs or ramp for entrance ?  ?Equipment Recommendations ?  (TBD in next venue)  ?  ?Recommendations for Other Services   ? ? ?  ?Precautions / Restrictions Precautions ?Precautions: Fall ?Restrictions ?Weight Bearing Restrictions: No  ?  ? ?Mobility ? Bed Mobility ?  ?  ?  ?  ?  ?  ?  ?General bed mobility comments: pt received & left sitting in recliner ?  ? ?Transfers ?Overall transfer level: Needs assistance ?Equipment used: 1 person hand held assist, None ?Transfers: Sit to/from Stand ?Sit to Stand: Min assist ?  ?  ?  ?  ?  ?  ?  ? ?Ambulation/Gait ?Ambulation/Gait assistance: Min assist, Mod assist ?Gait Distance (Feet): 75 Feet ?Assistive device: 1 person hand held assist ?Gait Pattern/deviations: Decreased step length - right, Decreased step length - left, Decreased dorsiflexion - right, Decreased dorsiflexion - left, Decreased weight shift to left ?Gait velocity: decreased ?  ?  ?General Gait Details: Pt ambulates with LUE HHA with slight L lateral lean during mobility, 2nd person managing IV pole ? ? ?Stairs ?  ?  ?  ?  ?  ? ? ?Wheelchair Mobility ?  ? ?Modified Rankin (Stroke Patients Only) ?  ? ? ?  ?Balance Overall balance assessment: Needs assistance ?Sitting-balance support: Feet supported, No upper extremity supported, Single extremity supported ?Sitting balance-Leahy Scale: Good ?  ?  ?Standing balance support: Single extremity supported, During functional activity ?Standing balance-Leahy Scale: Poor ?Standing balance comment: slight L lateral lean during standing/gait ?  ?  ?  ?  ?  ?  ?  ?  ?  ?  ?  ?  ? ?  ?  Cognition Arousal/Alertness: Awake/alert ?Behavior During Therapy: Flat affect ?Overall Cognitive Status:  Impaired/Different from baseline ?Area of Impairment: Orientation, Attention, Memory, Following commands, Safety/judgement, Awareness, Problem solving ?  ?  ?  ?  ?  ?  ?  ?  ?Orientation Level: Person, Place (aware of name & DOB but unable to recall current age, state's she in the hospital but states it's [facility]) ?  ?Memory: Decreased short-term memory ?Following Commands: Follows one step commands with increased time, Follows one step commands inconsistently ?Safety/Judgement: Decreased awareness of deficits, Decreased awareness of safety ?Awareness: Intellectual ?Problem Solving: Slow processing, Decreased initiation, Difficulty sequencing, Requires verbal cues, Requires tactile cues ?  ?  ?  ? ?  ?Exercises Other Exercises ?Other Exercises: Pt performed 5x STS without BUE support with pt requiring max cuing for technique & sustained attention to task; pt able to perform STS with min assist. ?Other Exercises: Pt picks object up off of floor with min assist. ? ?  ?General Comments General comments (skin integrity, edema, etc.): improved participation ?  ?  ? ?Pertinent Vitals/Pain Pain Assessment ?Pain Assessment: No/denies pain  ? ? ?Home Living   ?  ?  ?  ?  ?  ?  ?  ?  ?  ?   ?  ?Prior Function    ?  ?  ?   ? ?PT Goals (current goals can now be found in the care plan section) Acute Rehab PT Goals ?Patient Stated Goal: get better ?PT Goal Formulation: With patient ?Time For Goal Achievement: 03/18/22 ?Potential to Achieve Goals: Good ?Progress towards PT goals: Progressing toward goals ? ?  ?Frequency ? ? ? 7X/week ? ? ? ?  ?PT Plan Discharge plan needs to be updated  ? ? ?Co-evaluation   ?  ?  ?  ?  ? ?  ?AM-PAC PT "6 Clicks" Mobility   ?Outcome Measure ? Help needed turning from your back to your side while in a flat bed without using bedrails?: A Little ?Help needed moving from lying on your back to sitting on the side of a flat bed without using bedrails?: A Little ?Help needed moving to and  from a bed to a chair (including a wheelchair)?: A Little ?Help needed standing up from a chair using your arms (e.g., wheelchair or bedside chair)?: A Little ?Help needed to walk in hospital room?: A Lot ?Help needed climbing 3-5 steps with a railing? : A Lot ?6 Click Score: 16 ? ?  ?End of Session   ?Activity Tolerance: Patient tolerated treatment well ?Patient left: in chair;with chair alarm set;with call bell/phone within reach;with family/visitor present;with nursing/sitter in room (BUE mittens donned) ?  ?PT Visit Diagnosis: Muscle weakness (generalized) (M62.81);Difficulty in walking, not elsewhere classified (R26.2);Hemiplegia and hemiparesis;Unsteadiness on feet (R26.81) ?Hemiplegia - Right/Left: Left ?Hemiplegia - dominant/non-dominant: Non-dominant ?Hemiplegia - caused by: Cerebral infarction ?  ? ? ?Time: 2130-8657 ?PT Time Calculation (min) (ACUTE ONLY): 23 min ? ?Charges:  $Therapeutic Activity: 8-22 mins ?$Neuromuscular Re-education: 8-22 mins          ?          ? ?Aleda Grana, PT, DPT ?03/09/22, 11:07 AM ? ? ? ?Sandi Mariscal ?03/09/2022, 11:07 AM ? ?

## 2022-03-09 NOTE — Progress Notes (Signed)
?PROGRESS NOTE ? ? ?HPI was taken from Dr. Lorenso Courier: ?History of Present Illness: Patient s/p LEFT CEA on 01/25/2022 at our facility secondary to TIA and high grade stenosis. The patient had been well, presented for follow up without issue. Noted on 02/20/2022 she developed sudden swelling, pain and murky drainage from the incision. She was evaluated by urgent care on 02/22/2022 and it was felt she had a wound infection. She was treated with ABX. Last night the patient developed significant bleeding and hypotension to the 70s. Taken by EMS to [facility], digital pressure held, transfused pRBC. Neurologically intact. [facility] Forest[facility], with active bleeding. That facility and Vascular surgeon did not accept the patient. I accepted the patient for emergent intervention. Upon arrival EMS was holding pressure at neck incision, the patient was awake, alert and mentating and aware of situation. She Understood significant risks of bleeding, stroke and death. Consent obtained. ?  ?As per Dr. Mortimer Fries: ?02/25/22: Admit to ICU s/p left carotid artery repair in the setting of endarterectomy carotid rupture due to left carotid patch infection remained mechanically intubated postop ?02/28/22: Pt underwent left carotid artery stent placement  ?02/28/22: Will perform SBT with plans for possible extubation.  Pt unable to follow commands during W[facility] CT Head revealed subacute right MCA stroke as well as recent but older-appearing left MCA strokes x2.  Neurology consulted.  Unable to extubate  ?4/23 MRI brain: Moderate-to-large acute/early subacute right MCA territory infarct within the cortical/subcortical right frontal lobe as well as right insula and subinsular region. Petechial hemorrhage at this site. Local mass effect without significant ventricular ?4/23 NEUROLOGY CONSULTED ? ? ?Veronica Murray  [id number] DOB: 24-Sep-1956 DOA:  02/24/2022 ?PCP: Patient, No Pcp Per (Inactive) ? ? ?Assessment & Plan: ?  ?Principal Problem: ?  Post-operative state ?Active Problems: ?  Rupture of carotid artery ([place]) ?  CVA (cerebral vascular accident) [facility]) ?  DM2 (diabetes mellitus, type 2) ([place]) ?  Hypokalemia ?  Dysphagia following cerebral infarction ?  Normocytic anemia ?  Leukocytosis ?  Thrombocytosis ?  Hyponatremia ?  Hip pain ? ?Assessment and Plan: ? ?Acute hypoxic & hypercapnic respiratory failure: s/p intubation, ventilation & extubation. Now weaned off o2, breathing comfortably. At high risk for aspiration ? ?Rupture left carotid: from infected graft, now pod 11 from exploration for bleeding and pod 7 from left ICA stent. Stable, vascular surgery following. Not able to take by mouth. Rectal aspirin ordered, will need addition of plavix when tube feeds begin. No signs re-bleeding.  ? ?Strep constellatus infection: endovascular infection, ID following, advising at least 6 wks IV abx. Advising continue unasyn for now, switch to ceftriaxone as OPAT, through 5/28 ? ?Diarrhea? Sitter says small volume diarrhea on 4/28. Nursing collected loose stool, gi pathogen panel neg. No fever, no leukocytosis, and no diarrhea today, thus holding off on further testing. ? ?Abdominal pain: labs and CT imaging unremarkable save for distended bladder though patient able to void spontaneously, ua not suggestive of infection, PVR wnl. Will continue to monitor. ? ?Hyponatremia: resolved ? ?CVA: MRI brain 4/22 shows multiple small acute/early subacute cortical & subcortical infarcts w/ petechial hemorrhage associated w/ some of these infarcts. Chronic infarcts also noted. Now on aspirin and statin. Needs plavix when can take po. Echo shows EF 60-65%, no regional wall motion abnormalities, diastolic function is normal, & atrial septum is grossly normal. Neuro recs  apprec, they have signed off. CIR evaluating. Repeat head ct on 4/28 stable cva, no hemorrhage. ? ?Dysphagia:  remains at high risk for aspiration. Has self-discontinued NG tube several times. IR declines to place again due to this. Patient agreeable to peg tube but would need to be off asa/plavix 5 days for that from IR Vascular wants uninterrupted antiplatelet. Vascular has spoken w/ gen surg, plan is open g tube placement on 5/2. TPN has been ordered and started on 4/28. No signs refeeding on today's labs. Today speech says she has progressed, will trial some PO. ? ?Hip pain: no fall. Pain resolved. X-ray 4/26 w/o signs fracture ? ?DM2: glucose elevated with tpn, will start semglee 5, continue SSI ? ?Hypokalemia: monitor and replace as needed. 30 IV this am, will order 20 more. K 3.1 this morning. ? ?Normocytic anemia: H&H are stable. Will transfuse if Hb < 7.0 ? ? ? ? ? ?DVT prophylaxis: lovenox ?Code Status:  full  ?Family Communication: daughter robin updated telephonically 4/29 ?Disposition Plan: snf vs CIR ? ?Level of care: Med-Surg ? ?Status is: Inpatient ?Remains inpatient appropriate because: severity of illness ? ? ? ?Consultants:  ?ICU ?Vasc surg  ?Neuro  ? ?Procedures: ? ?Antimicrobials: unasyn  ? ?Subjective: ?No complaints. Lower abd pain stable. No diarrhea. Voiding spontaneously. Tolerated some food today. ? ?Objective: ?Vitals:  ? 03/09/22 0416 03/09/22 0548 03/09/22 1100 03/09/22 1300  ?BP:  (!) 147/55 135/63 (!) 133/56  ?Pulse:  89  83  ?Resp:  16  18  ?Temp:  98.7 ?F (37.1 ?C)  (!) 97.3 ?F (36.3 ?C)  ?TempSrc:  Oral  Oral  ?SpO2:  97%  98%  ?Weight: 74.7 kg     ?Height:      ? ?No intake or output data in the 24 hours ending 03/09/22 1521 ? ?Filed Weights  ? 03/06/22 0500 03/07/22 0418 03/09/22 0416  ?Weight: 75.7 kg 77.8 kg 74.7 kg  ? ? ?Examination: ? ?General exam: Appears calm and comfortable  ?Respiratory system: diminished breath sounds b/l ?Cardiovascular system: S1 & S2 +. No  rubs, gallops or clicks.  ?Gastrointestinal system: Abdomen is nondistended, soft and nontender. Normal bowel sounds  heard. ?Central nervous system: awake, alert, slow to respond. Moving all 4 ext ?Psychiatry: calm ? ? ? ?Data Reviewed: I have personally reviewed following labs and imaging studies ? ?CBC: ?Recent Labs  ?Lab 03/05/22 ?[id number] 03/06/22 ?[id number] 03/07/22 ?1859 03/07/22 ?2047 03/09/22 ?0510  ?WBC 14.5* 10.1 5.6 7.5 5.4  ?NEUTROABS  --   --  3.3 4.6  --   ?HGB 9.6* 9.9* 7.0* 9.8* 9.0*  ?HCT 28.5* 29.6* 20.6* 28.9* 26.9*  ?MCV 88.0 87.8 88.0 86.3 87.9  ?PLT 531* 604* 437* 617* 599*  ? ?Basic Metabolic Panel: ?Recent Labs  ?Lab 03/05/22 ?[id number] 03/06/22 ?[id number] 03/06/22 ?2345 03/07/22 ?0435 03/07/22 ?2047 03/08/22 ?1311 03/09/22 ?0510  ?NA 133* 135  --  133* 136 135 134*  ?K 3.1* 3.5  --  3.0* 3.6 3.4* 3.1*  ?CL 97* 94*  --  97* 101 98 98  ?CO2 28 30  --  28 25 27 28   ?GLUCOSE 98 139*  --  207* 160* 129* 218*  ?BUN 16 13  --  11 11 11 13   ?CREATININE 0.39* 0.41* 0.35* 0.36* 0.40* 0.44 0.36*  ?CALCIUM 8.3* 8.5*  --  8.1* 8.3* 8.6* 8.2*  ?MG 2.0 1.9  --  1.9  --  1.9 1.9  ?PHOS 3.9 3.0  --  3.1  --  3.7 3.5  ? ?GFR: ?Estimated Creatinine Clearance: 68.1 mL/min (A) (by C-G formula based on SCr of 0.36 mg/dL (L)). ?Liver Function Tests: ?Recent Labs  ?Lab 03/03/22 ?1348 03/07/22 ?2047 03/08/22 ?1311 03/09/22 ?0510  ?AST  --  22 23 18   ?ALT  --  24 22 19   ?ALKPHOS  --  115 116 100  ?BILITOT  --  0.8 0.8 0.5  ?PROT  --  5.9* 6.2* 5.7*  ?ALBUMIN 2.4* 2.5* 2.6* 2.4*  ? ?No results for input(s): LIPASE, AMYLASE in the last 168 hours. ?No results for input(s): AMMONIA in the last 168 hours. ?Coagulation Profile: ?Recent Labs  ?Lab 03/08/22 ?1311  ?INR 1.1  ? ?Cardiac Enzymes: ?No results for input(s): CKTOTAL, CKMB, CKMBINDEX, TROPONINI in the last 168 hours. ?BNP (last 3 results) ?No results for input(s): PROBNP in the last 8760 hours. ?HbA1C: ?No results for input(s): HGBA1C in the last 72 hours. ?CBG: ?Recent Labs  ?Lab 03/08/22 ?[id number] 03/08/22 ?1550 03/08/22 ?2337 03/09/22 ?[id number] 03/09/22 ?1318  ?GLUCAP 151* 130* 213* 225* 233*  ? ?Lipid  Profile: ?Recent Labs  ?  03/08/22 ?1311 03/09/22 ?0510  ?TRIG 91 105  ? ?Thyroid Function Tests: ?No results for input(s): TSH, T4TOTAL, FREET4, T3FREE, THYROIDAB in the last 72 hours. ?Anemia Panel: ?

## 2022-03-09 NOTE — Progress Notes (Signed)
Occupational Therapy Treatment ?Patient Details ?Name: Veronica Murray ?MRN: [id number] ?DOB: June 08, 1956 ?Today's Date: 03/09/2022 ? ? ?History of present illness 66 yo F transferred emergently to [facility] ED for emergent vascular intervention on a ruptured carotid endarterectomy wound. Intubated on 4/16, extubated on 4/24. 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 Petechial hemorrhage at this site."  Hospital course significant for L neck exploration with surture repair to L carotid (02/24/22) and placement of embolic protective device L carotid (02/28/22) ?  ?OT comments ? Chart reviewed to date, RN cleared pt for participation in OT tx session. Pt is with B mitts and sitter on this date. Pt does appear with improved participation  in therapy and decreased assist required for all mobility on this date. Pt performs UB dressing with MOD A, short amb transfer to bedside chair with MIN A with HHA. At this time pt would benefit from and tolerate increased rehab to facilitate return to PLOF, recommendation changed to AIR. Pt daughter in room reports assist with be provided to patient following discharge, they are working out details at this time. Pt is left in bedside chair +B mitts per RN request, sitter present, +chair alarm. OT will continue to follow acutely.   ? ?Recommendations for follow up therapy are one component of a multi-disciplinary discharge planning process, led by the attending physician.  Recommendations may be updated based on patient status, additional functional criteria and insurance authorization. ?   ?Follow Up Recommendations ? Acute inpatient rehab (3hours/day)  ?  ?Assistance Recommended at Discharge Frequent or constant Supervision/Assistance  ?Patient can return home with the following ? Assistance with cooking/housework;Assist for  transportation;Direct supervision/assist for medications management;Direct supervision/assist for financial management;Help with stairs or ramp for entrance;A lot of help with walking and/or transfers;A lot of help with bathing/dressing/bathroom ?  ?Equipment Recommendations ? Other (comment) (per next venue of care)  ?  ?Recommendations for Other Services   ? ?  ?Precautions / Restrictions Precautions ?Precautions: Fall ?Restrictions ?Weight Bearing Restrictions: No  ? ? ?  ? ?Mobility Bed Mobility ?Overal bed mobility: Needs Assistance ?Bed Mobility: Supine to Sit ?  ?  ?Supine to sit: Supervision, HOB elevated ?  ?  ?  ?  ? ?Transfers ?Overall transfer level: Needs assistance ?Equipment used: 1 person hand held assist, None ?Transfers: Sit to/from Stand ?Sit to Stand: Min assist ?  ?  ?  ?  ?  ?  ?  ?  ?Balance Overall balance assessment: Needs assistance ?Sitting-balance support: Feet supported, No upper extremity supported, Single extremity supported ?Sitting balance-Leahy Scale: Good ?Sitting balance - Comments: no LOB or lean noted on this date ?  ?Standing balance support: Single extremity supported, During functional activity ?Standing balance-Leahy Scale: Poor ?  ?  ?  ?  ?  ?  ?  ?  ?  ?  ?  ?  ?   ? ?ADL either performed or assessed with clinical judgement  ? ?ADL Overall ADL's : Needs assistance/impaired ?  ?Eating/Feeding Details (indicate cue type and reason): NPO pt on TPN ?Grooming: Wash/dry face;Sitting;Set up ?Grooming Details (indicate cue type and reason): at edge of bed, no LOB noted with dynamic/static sitting grooming tasks ?  ?  ?  ?  ?Upper Body Dressing : Minimal assistance;Sitting;Cueing for sequencing ?  ?Lower Body Dressing: Maximal assistance;Bed level ?Lower Body Dressing Details (indicate cue type and reason):  socks ?Toilet Transfer: Minimal assistance ?Toilet Transfer Details (indicate cue type and reason): simulated;short amb transfer to bedside chair with HHA ?  ?  ?  ?   ?Functional mobility during ADLs: Minimal assistance;Cueing for sequencing;Cueing for safety ?  ?  ? ?Extremity/Trunk Assessment   ?  ?  ?  ?  ?  ? ?Vision   ?  ?  ?Perception   ?  ?Praxis   ?  ? ?Cognition Arousal/Alertness: Awake/alert ?Behavior During Therapy: Flat affect, Restless ?Overall Cognitive Status: Impaired/Different from baseline ?Area of Impairment: Orientation, Attention, Memory, Following commands, Safety/judgement, Awareness, Problem solving ?  ?  ?  ?  ?  ?  ?  ?  ?Orientation Level: Disoriented to, Situation, Time ?Current Attention Level: Sustained ?Memory: Decreased short-term memory ?Following Commands: Follows one step commands with increased time ?Safety/Judgement: Decreased awareness of deficits, Decreased awareness of safety ?Awareness: Intellectual ?Problem Solving: Slow processing, Decreased initiation, Difficulty sequencing, Requires verbal cues, Requires tactile cues ?General Comments: Improved sustained attention to ADL tasks on this date ?  ?  ?   ?Exercises   ? ?  ?Shoulder Instructions   ? ? ?  ?General Comments improved participation  ? ? ?Pertinent Vitals/ Pain       Pain Assessment ?Pain Assessment: 0-10 ?Pain Score: 7  ?Pain Location: generalized ?Pain Descriptors / Indicators: Aching, Grimacing, Guarding ?Pain Intervention(s): Repositioned, Limited activity within patient's tolerance, Monitored during session ? ?Home Living   ?  ?  ?  ?  ?  ?  ?  ?  ?  ?  ?  ?  ?  ?  ?  ?  ?  ?  ? ?  ?Prior Functioning/Environment    ?  ?  ?  ?   ? ?Frequency ? Min 4X/week  ? ? ? ? ?  ?Progress Toward Goals ? ?OT Goals(current goals can now be found in the care plan section) ? Progress towards OT goals: Progressing toward goals ? ?Acute Rehab OT Goals ?Patient Stated Goal: go home ?OT Goal Formulation: With patient/family ?Time For Goal Achievement: 03/23/22 ?Potential to Achieve Goals: Fair  ?Plan Discharge plan needs to be updated;Frequency needs to be updated   ? ?Co-evaluation ? ? ?   ?   ?  ?  ?  ? ?  ?AM-PAC OT "6 Clicks" Daily Activity     ?Outcome Measure ? ? Help from another person eating meals?: Total ?Help from another person taking care of personal grooming?: A Little ?Help from another person toileting, which includes using toliet, bedpan, or urinal?: A Lot ?Help from another person bathing (including washing, rinsing, drying)?: A Lot ?Help from another person to put on and taking off regular upper body clothing?: A Little ?Help from another person to put on and taking off regular lower body clothing?: A Lot ?6 Click Score: 13 ? ?  ?End of Session   ? ?OT Visit Diagnosis: Other abnormalities of gait and mobility (R26.89);Hemiplegia and hemiparesis;Other symptoms and signs involving cognitive function ?Hemiplegia - Right/Left: Left ?  ?Activity Tolerance Patient tolerated treatment well ?  ?Patient Left in chair;with call bell/phone within reach;with chair alarm set;with nursing/sitter in room;with family/visitor present ?  ?Nurse Communication Mobility status ?  ? ?   ? ?Time: 7902-4097 ?OT Time Calculation (min): 24 min ? ?Charges: OT General Charges ?$OT Visit: 1 Visit ?OT Treatments ?$Self Care/Home Management : 23-37 mins ? ?Oleta Mouse, OTD OTR/L  ?03/09/22, 11:10 AM  ?

## 2022-03-10 DIAGNOSIS — Z9889 Other specified postprocedural states: Secondary | ICD-10-CM | POA: Diagnosis not present

## 2022-03-10 LAB — CBC
HCT: 26 % — ABNORMAL LOW (ref 36.0–46.0)
Hemoglobin: 8.6 g/dL — ABNORMAL LOW (ref 12.0–15.0)
MCH: 29.4 pg (ref 26.0–34.0)
MCHC: 33.1 g/dL (ref 30.0–36.0)
MCV: 88.7 fL (ref 80.0–100.0)
Platelets: 497 10*3/uL — ABNORMAL HIGH (ref 150–400)
RBC: 2.93 MIL/uL — ABNORMAL LOW (ref 3.87–5.11)
RDW: 13.1 % (ref 11.5–15.5)
WBC: 5.8 10*3/uL (ref 4.0–10.5)
nRBC: 0 % (ref 0.0–0.2)

## 2022-03-10 LAB — BASIC METABOLIC PANEL
Anion gap: 4 — ABNORMAL LOW (ref 5–15)
BUN: 13 mg/dL (ref 8–23)
CO2: 28 mmol/L (ref 22–32)
Calcium: 8.1 mg/dL — ABNORMAL LOW (ref 8.9–10.3)
Chloride: 101 mmol/L (ref 98–111)
Creatinine, Ser: 0.45 mg/dL (ref 0.44–1.00)
GFR, Estimated: >60 mL/min (ref >60)
Glucose, Bld: 218 mg/dL — ABNORMAL HIGH (ref 70–99)
Potassium: 3.6 mmol/L (ref 3.5–5.1)
Sodium: 133 mmol/L — ABNORMAL LOW (ref 135–145)

## 2022-03-10 LAB — GLUCOSE, CAPILLARY
Glucose-Capillary: 168 mg/dL — ABNORMAL HIGH (ref 70–99)
Glucose-Capillary: 204 mg/dL — ABNORMAL HIGH (ref 70–99)
Glucose-Capillary: 208 mg/dL — ABNORMAL HIGH (ref 70–99)
Glucose-Capillary: 226 mg/dL — ABNORMAL HIGH (ref 70–99)
Glucose-Capillary: 239 mg/dL — ABNORMAL HIGH (ref 70–99)
Glucose-Capillary: 252 mg/dL — ABNORMAL HIGH (ref 70–99)
Glucose-Capillary: 269 mg/dL — ABNORMAL HIGH (ref 70–99)

## 2022-03-10 LAB — MAGNESIUM: Magnesium: 1.8 mg/dL (ref 1.7–2.4)

## 2022-03-10 LAB — PHOSPHORUS: Phosphorus: 3.8 mg/dL (ref 2.5–4.6)

## 2022-03-10 MED ORDER — MELATONIN 5 MG PO TABS
5.0000 mg | ORAL_TABLET | Freq: Once | ORAL | Status: AC
  Administered 2022-03-10: 5 mg via ORAL
  Filled 2022-03-10: qty 1

## 2022-03-10 MED ORDER — INSULIN GLARGINE-YFGN 100 UNIT/ML ~~LOC~~ SOLN
10.0000 [IU] | Freq: Every day | SUBCUTANEOUS | Status: DC
  Administered 2022-03-10: 10 [IU] via SUBCUTANEOUS
  Filled 2022-03-10: qty 0.1

## 2022-03-10 MED ORDER — CLOPIDOGREL BISULFATE 75 MG PO TABS
75.0000 mg | ORAL_TABLET | Freq: Every day | ORAL | Status: DC
  Administered 2022-03-10 – 2022-03-15 (×6): 75 mg via ORAL
  Filled 2022-03-10 (×6): qty 1

## 2022-03-10 MED ORDER — TRAVASOL 10 % IV SOLN
INTRAVENOUS | Status: AC
  Filled 2022-03-10: qty 1062

## 2022-03-10 MED ORDER — ASPIRIN EC 81 MG PO TBEC
81.0000 mg | DELAYED_RELEASE_TABLET | Freq: Every day | ORAL | Status: DC
  Administered 2022-03-11 – 2022-03-15 (×5): 81 mg via ORAL
  Filled 2022-03-10 (×5): qty 1

## 2022-03-10 MED ORDER — MAGNESIUM SULFATE 2 GM/50ML IV SOLN
2.0000 g | Freq: Once | INTRAVENOUS | Status: AC
  Administered 2022-03-10: 2 g via INTRAVENOUS
  Filled 2022-03-10: qty 50

## 2022-03-10 NOTE — Consult Note (Signed)
PHARMACY - TOTAL PARENTERAL NUTRITION CONSULT NOTE  ? ?Indication:  severe disphagia, not tolerating enteral feedings ? ?Patient Measurements: ?Height: 5\' 2"  (157.5 cm) ?Weight: 74.7 kg (164 lb 10.9 oz) ?IBW/kg (Calculated) : 50.1 ?TPN AdjBW (KG): 56.9 ?Body mass index is 30.12 kg/m?. ?Usual Weight: 82.9 kg on 03/04/22 ? ?Assessment: Pharmacy has been consulted to initiate TPN in 65yo patient admitted s/p left CEA on 01/25/22 secondary to TIA and high grade stenosis. Patient had been doing well, but started developing sudden swelling, pain and murky drainage from incision on 02/20/22. 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 Due to left carotid patch infection, patient remained intubated. On 4/20, underwent left carotid artery stent placement. Patient still having dysphagia and at high risk for aspiration. Has self-discontinued NG tube several times. IR has declined to place again d/t this. General surgery planning to place gastrostomy tube this coming Tuesday, 03/12/22. Plan for TPN to start once PICC line is placed. ? ?Glucose / Insulin: BG 166-252/ SSI 0-20 units,  ?Last 24h: 43 units ?MD added Lantus 5 units at bedtime on 4/29 ? ?Electrolytes: Mag 1.8- replaced w/ 2 gm Mag IV ?Renal: SCR at baseline ?Hepatic: AST/ALT wnl ?Intake / Output; MIVF:   ?GI Imaging:  ?1. Distended urinary bladder to the umbilicus, suspicious for urinary retention. No bladder wall thickening. ?2. Small to moderate left and small right pleural effusions with associated compressive atelectasis. Mild generalized body wall edema ?in the flanks. ?3. Minimal wall thickening of the distal esophagus, can be seen with reflux or esophagitis. ? ?GI Surgeries / Procedures:  ?Plan for G-tube placement on 5/2 ? ?Central access: PICC to be placed 4/28 ?TPN start date: 4/28 pending PICC  ? ?Nutritional Goals: ?Goal TPN rate is 75 mL/hr (provides 106.2 g of protein and 1810.8 kcals per day) ? ?RD  Assessment: ?Estimated Needs ?Total Energy Estimated Needs: 1800-2100kcal/day ?Total Protein Estimated Needs: 90-105g/day ?Total Fluid Estimated Needs: 1.5-1.8L/day ? ?Current Nutrition:  ?Dysphagia 1 diet ordered 4/29 ? ?Plan:  ?Continue TPN at goal rate of 75 mL/hr at 1800 ?Electrolytes in TPN: Na 79mEq/L, K 63mEq/L, Ca 70mEq/L, Mg 21mEq/L, and Phos 50mmol/L. Cl:Ac 1:1 ?Mag 1.8-Ordered Magnesium sulfate 1 gram IV x1 dose. Patient had only received ~12 hrs of *goal*rate TPN when labs drawn  ?Add standard MVI and trace elements to TPN ?Adding thiamine 100mg  x 3 days(Day3) ?Nutritional components(once at full rate) ?Amino acids (using Travasol 10%): 106.2 grams ?Dextrose: 270 grams ?Lipids (using 20% SMOFlipids): 46.8 grams ?kCal: 1810.8 ? ?Initiate Resistant q4h SSI and adjust as needed  ?Lantus 5 units added 4/29 evening by MD. Based on total SSI insulin given(last 24 hrs) of 43 units, will increase to 10 units in evening for 4/30 ?Monitor TPN labs on Mon/Thurs, currently ordered as daily until electrolytes stabilize. ? ?Veronica Murray A ?03/10/2022,9:46 AM ? ?

## 2022-03-10 NOTE — Progress Notes (Signed)
Inpatient Rehab Admissions Coordinator:  ? ?I spoke with Pt. And husband regarding potential CIR admit. They state that they wish for pt. To go home with home health and do not wish to pursue rehab in an inpatient setting. CIR will sign off at this time. I will notify TOC. ? ?Megan Salon, MS, CCC-SLP ?Rehab Admissions Coordinator  ?[phone] (celll) ?[phone] (office) ? ?

## 2022-03-10 NOTE — Progress Notes (Signed)
?PROGRESS NOTE ? ? ?HPI was taken from Dr. Lorenso Courier: ?History of Present Illness: Patient s/p LEFT CEA on 01/25/2022 at our facility secondary to TIA and high grade stenosis. The patient had been well, presented for follow up without issue. Noted on 02/20/2022 she developed sudden swelling, pain and murky drainage from the incision. She was evaluated by urgent care on 02/22/2022 and it was felt she had a wound infection. She was treated with ABX. Last night the patient developed significant bleeding and hypotension to the 70s. Taken by EMS to [facility], digital pressure held, transfused pRBC. Neurologically intact. [facility] Forestine [facility], with active bleeding. That facility and Vascular surgeon did not accept the patient. I accepted the patient for emergent intervention. Upon arrival EMS was holding pressure at neck incision, the patient was awake, alert and mentating and aware of situation. She Understood significant risks of bleeding, stroke and death. Consent obtained. ?  ?As per Dr. Mortimer Fries: ?02/25/22: Admit to ICU s/p left carotid artery repair in the setting of endarterectomy carotid rupture due to left carotid patch infection remained mechanically intubated postop ?02/28/22: Pt underwent left carotid artery stent placement  ?02/28/22: Will perform SBT with plans for possible extubation.  Pt unable to follow commands during WUA CT Head revealed subacute right MCA stroke as well as recent but older-appearing left MCA strokes x2.  Neurology consulted.  Unable to extubate  ?4/23 MRI brain: Moderate-to-large acute/early subacute right MCA territory infarct within the cortical/subcortical right frontal lobe as well as right insula and subinsular region. Petechial hemorrhage at this site. Local mass effect without significant ventricular ?4/23 NEUROLOGY CONSULTED ? ? ?Veronica Murray  [id number] DOB: [birth year] DOA:  02/24/2022 ?PCP: Patient, No Pcp Per (Inactive) ? ? ?Assessment & Plan: ?  ?Principal Problem: ?  Post-operative state ?Active Problems: ?  Rupture of carotid artery ([place]) ?  CVA (cerebral vascular accident) [facility]) ?  DM2 (diabetes mellitus, type 2) ([place]) ?  Hypokalemia ?  Dysphagia following cerebral infarction ?  Normocytic anemia ?  Leukocytosis ?  Thrombocytosis ?  Hyponatremia ?  Hip pain ? ?Assessment and Plan: ? ?Acute hypoxic & hypercapnic respiratory failure: s/p intubation, ventilation & extubation. Now weaned off o2, breathing comfortably. At high risk for aspiration ? ?Rupture left carotid: from infected graft, now pod 11 from exploration for bleeding and pod 7 from left ICA stent. Stable, vascular surgery following. Now taking some by mouth, will convert aspirin to oral and re-start plavix. No signs re-bleeding.  ? ?Strep constellatus infection: endovascular infection, ID following, advising at least 6 wks IV abx. Advising continue unasyn for now, switch to ceftriaxone as OPAT, through 5/28 ? ?Diarrhea? Sitter says small volume diarrhea on 4/28. Nursing collected loose stool, gi pathogen panel neg. No fever, no leukocytosis, and no diarrhea today, thus holding off on further testing. ? ?Abdominal pain: labs and CT imaging unremarkable save for distended bladder though patient able to void spontaneously, ua not suggestive of infection, PVR wnl. Will continue to monitor. ? ?Hyponatremia: resolved ? ?CVA: MRI brain 4/22 shows multiple small acute/early subacute cortical & subcortical infarcts w/ petechial hemorrhage associated w/ some of these infarcts. Chronic infarcts also noted. Now on aspirin and statin. Needs plavix when can take po. Echo shows EF 60-65%, no regional wall motion abnormalities, diastolic function is normal, & atrial septum is grossly normal. Neuro recs apprec, they have signed off.  Repeat head ct on 4/28 stable cva, no hemorrhage. CIR has signed off because family said no but today they  may have a change of heart, will discuss as a family.  ? ?Dysphagia: remains at high risk for aspiration. Has self-discontinued NG tube several times. IR declines to place again due to this. Patient agreeable to peg tube but would need to be off asa/plavix 5 days for that from IR Vascular wants uninterrupted antiplatelet. Vascular has spoken w/ gen surg, plan is open g tube placement on 5/2. TPN has been ordered and started on 4/28. No signs refeeding on today's labs. Now tolerating some oral feeds so hopeful we can defer g-tube ? ?Hip pain: no fall. Pain resolved. X-ray 4/26 w/o signs fracture ? ?DM2: glucose elevated with tpn, will increase semglee to 10, continue SSI ? ?Hypokalemia: monitor and replace as needed.  ? ?Normocytic anemia: H&H are stable. Will transfuse if Hb < 7.0 ? ? ? ? ? ?DVT prophylaxis: lovenox ?Code Status:  full  ?Family Communication: daughters updated bedside 4/30 ?Disposition Plan: tbd ? ?Level of care: Med-Surg ? ?Status is: Inpatient ?Remains inpatient appropriate because: severity of illness ? ? ? ?Consultants:  ?ICU ?Vasc surg  ?Neuro  ? ?Procedures: ? ?Antimicrobials: unasyn  ? ?Subjective: ?No complaints. Lower abd pain stable. No diarrhea. Voiding spontaneously. Tolerated some food today. ? ?Objective: ?Vitals:  ? 03/09/22 1810 03/09/22 1911 03/10/22 [id number] 03/10/22 0724  ?BP: 118/62 107/74 116/65 (!) 144/67  ?Pulse: 87 86 87 84  ?Resp: 16 19 19 18   ?Temp: 98.4 ?F (36.9 ?C) 98.3 ?F (36.8 ?C) 98.4 ?F (36.9 ?C) 98.1 ?F (36.7 ?C)  ?TempSrc: Oral Oral    ?SpO2: 99% 95% 98% 98%  ?Weight:      ?Height:      ? ? ?Intake/Output Summary (Last 24 hours) at 03/10/2022 1301 ?Last data filed at 03/10/2022 1217 ?Gross per 24 hour  ?Intake 3226.08 ml  ?Output 900 ml  ?Net 2326.08 ml  ? ? ?Filed Weights  ? 03/06/22 0500 03/07/22 0418 03/09/22 0416  ?Weight: 75.7 kg 77.8 kg 74.7 kg  ? ? ?Examination: ? ?General exam: Appears calm and comfortable  ?Respiratory system: diminished breath sounds  b/l ?Cardiovascular system: S1 & S2 +. No  rubs, gallops or clicks.  ?Gastrointestinal system: Abdomen is nondistended, soft and nontender. Normal bowel sounds heard. ?Central nervous system: awake, alert, slow to respond. Moving all 4 ext ?Psychiatry: calm ? ? ? ?Data Reviewed: I have personally reviewed following labs and imaging studies ? ?CBC: ?Recent Labs  ?Lab 03/06/22 ?[id number] 03/07/22 ?1859 03/07/22 ?2047 03/09/22 ?0510 03/10/22 ?0439  ?WBC 10.1 5.6 7.5 5.4 5.8  ?NEUTROABS  --  3.3 4.6  --   --   ?HGB 9.9* 7.0* 9.8* 9.0* 8.6*  ?HCT 29.6* 20.6* 28.9* 26.9* 26.0*  ?MCV 87.8 88.0 86.3 87.9 88.7  ?PLT 604* 437* 617* 599* 497*  ? ?Basic Metabolic Panel: ?Recent Labs  ?Lab 03/06/22 ?[id number] 03/06/22 ?2345 03/07/22 ?0435 03/07/22 ?2047 03/08/22 ?1311 03/09/22 ?0510 03/10/22 ?0439  ?NA 135  --  133* 136 135 134* 133*  ?K 3.5  --  3.0* 3.6 3.4* 3.1* 3.6  ?CL 94*  --  97* 101 98 98 101  ?CO2 30  --  28 25 27 28 28   ?GLUCOSE 139*  --  207* 160* 129* 218* 218*  ?BUN 13  --  11 11 11 13 13   ?CREATININE 0.41*   < > 0.36* 0.40* 0.44 0.36* 0.45  ?CALCIUM 8.5*  --  8.1* 8.3* 8.6* 8.2* 8.1*  ?MG 1.9  --  1.9  --  1.9 1.9 1.8  ?PHOS 3.0  --  3.1  --  3.7 3.5 3.8  ? < > = values in this interval not displayed.  ? ?GFR: ?Estimated Creatinine Clearance: 68.1 mL/min (by C-G formula based on SCr of 0.45 mg/dL). ?Liver Function Tests: ?Recent Labs  ?Lab 03/03/22 ?1348 03/07/22 ?2047 03/08/22 ?1311 03/09/22 ?0510  ?AST  --  22 23 18   ?ALT  --  24 22 19   ?ALKPHOS  --  115 116 100  ?BILITOT  --  0.8 0.8 0.5  ?PROT  --  5.9* 6.2* 5.7*  ?ALBUMIN 2.4* 2.5* 2.6* 2.4*  ? ?No results for input(s): LIPASE, AMYLASE in the last 168 hours. ?No results for input(s): AMMONIA in the last 168 hours. ?Coagulation Profile: ?Recent Labs  ?Lab 03/08/22 ?1311  ?INR 1.1  ? ?Cardiac Enzymes: ?No results for input(s): CKTOTAL, CKMB, CKMBINDEX, TROPONINI in the last 168 hours. ?BNP (last 3 results) ?No results for input(s): PROBNP in the last 8760  hours. ?HbA1C: ?No results for input(s): HGBA1C in the last 72 hours. ?CBG: ?Recent Labs  ?Lab 03/09/22 ?2019 03/10/22 ?0018 03/10/22 ?[id number] 03/10/22 ?0732 03/10/22 ?1146  ?GLUCAP 166* 252* 208* 168* 269*  ? ?Lipid Profile: ?Re

## 2022-03-10 NOTE — Progress Notes (Addendum)
Speech Language Pathology Treatment:    ?Patient Details ?Name: Veronica Murray ?MRN: [id number] ?DOB: 1956/04/10 ?Today's Date: 03/10/2022 ?Time: [id number] ?SLP Time Calculation (min) (ACUTE ONLY): 10 min ? ?Assessment / Plan / Recommendation ?Clinical Impression ? Pt seen for diet tolerance and trials of upgraded textures. Pt in bed. Alert with flat affect, R gaze preference, and inconsistently delayed verbal responses.  ? ?Pt observed with trials of puree and nectar-thick liquids via cup sip. Set up assistance needed to promote self feeding. Pt continues to present with s/sx moderate oral dysphagia c/b inconsistent oral holding and delayed oral transit with pureed textures as well as anterior loss of nectar-thick liquids and pureed textures on L. Oral deficits likely due to orofacial weakness/incoordination and exacerbated by reduced sustained attention. No overt s/sx pharyngeal dysphagia noted across trials given; however, seemingly inconsistently delayed swallow initiation appreciated. Pt declined further PO trials including trials of thin liquids.  ? ?Per chart review, temp and WBC WNL. No recent CXR. ? ?Recommend continuation of a pureed diet with nectar-thick liquids and safe swallowing stategies/aspiration precautions as outlined below.  ? ?Pt continues to be at increased risk for aspiration/aspiration PNA given recent CVA, mental status, dental status, and recent/prolonged intubation. ? ?Pt, RN, and MD made aware of results, recommendations, and SLP POC. ?full understanding by pt.  ? ?SLP to f/u next date for further PO trials and determination of candidacy/readiness for MBS Study.  ?  ?HPI HPI: Per 17 H&P "Patient s/p LEFT CEA on 01/25/2022 at our facility secondary to TIA and high grade stenosis. The patient had been well, presented for follow up without issue. Noted on 02/20/2022 she developed sudden swelling, pain and murky drainage from the incision. She was evaluated by urgent care on 02/22/2022  and it was felt she had a wound infection. She was treated with ABX. Last night the patient developed significant bleeding and hypotension to the 70s. Taken by EMS to [facility], digital pressure held, transfused pRBC. Neurologically intact. [facility] Forestine [facility], with active bleeding. That facility and Vascular surgeon did not accept the patient. I accepted the patient for emergent intervention. Upon arrival EMS was holding pressure at neck incision, the patient was awake, alert and mentating and aware of situation. She Understood significant risks of bleeding, stroke and death. Consent obtained.".     CXR on 03/03/2022: while still orally intubated, No acute cardiopulmonary disease identified.  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 Petechial hemorrhage  at this site. Local mass effect without significant ventricular  effacement. No midline shift.     Multiple smaller acute/early subacute cortical and subcortical  infarcts within the left frontoparietal lobes, posterior left  temporal lobe and lateral left occipital lobe (left MCA vascular  territory, as well as left MCA/ACA and left MCA/PCA watershed  territories). Petechial hemorrhage associated with some of these  infarcts. 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 ?  ?   ?SLP Plan ? Continue with current plan of care ? ?Patient needs continued Speech Lanaguage Pathology Services ?  ?Recommendations for follow up therapy are one component of a multi-disciplinary discharge planning process, led by the attending physician.  Recommendations may be updated based on patient status, additional functional  criteria and insurance  authorization. ?  ? ?Recommendations  ?Diet recommendations: Dysphagia 1 (puree);Nectar-thick liquid ?Liquids provided via: Cup;No straw ?Medication Administration: Crushed with puree ?Supervision: Patient able to self feed;Full supervision/cueing for compensatory strategies ?Compensations: Minimize environmental distractions;Slow rate;Small sips/bites;Lingual sweep for clearance of pocketing;Follow solids with liquid ?Postural Changes and/or Swallow Maneuvers: Out of bed for meals;Seated upright 90 degrees;Upright 30-60 min after meal  ?   ?    ?   ? ? ? ? General recommendations:  (dietician consult) ?Oral Care Recommendations: Oral care BID;Oral care before and after PO;Patient independent with oral care;Staff/trained caregiver to provide oral care ?Follow Up Recommendations: Skilled nursing-short term rehab (<3 hours/day) ?Assistance recommended at discharge: Frequent or constant Supervision/Assistance ?SLP Visit Diagnosis: Dysphagia, oropharyngeal phase (R13.12) ?Plan: Continue with current plan of care ? ? ? ? ?  ?  ? ?Cherrie Gauze, M.S., CCC-SLP ?Speech-Language Pathologist ?[facility] ?[phone] ([place]) ? ?Quintella Baton ? ?03/10/2022, 1:43 PM ?

## 2022-03-10 NOTE — Evaluation (Addendum)
Speech Language Pathology Evaluation Patient Details Name: Veronica Murray MRN: [id number] DOB: 12/26/1955 Today's Date: 03/10/2022 Time: 0915-0930 SLP Time Calculation (min) (ACUTE ONLY): 15 min  Problem List:  Patient Active Problem List   Diagnosis Date Noted   Hip pain 03/06/2022   CVA (cerebral vascular accident) (HCC) 03/05/2022   DM2 (diabetes mellitus, type 2) (HCC) 03/05/2022   Hypokalemia 03/05/2022   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   Normocytic anemia 03/05/2022   Leukocytosis 03/05/2022   Thrombocytosis 03/05/2022   Hyponatremia 03/05/2022   Post-operative state 02/25/2022   Rupture of carotid artery (HCC)    Adjustment disorder with mixed anxiety and depressed mood 01/25/2022   Carotid stenosis, left 01/24/2022   Carotid stenosis 01/18/2022   CAD (coronary artery disease) 01/18/2022   Essential hypertension 01/18/2022   Abnormal stress test    Troponin I above reference range 06/25/2019   Past Medical History:  Past Medical History:  Diagnosis Date   Aortic arch atherosclerosis (HCC)    Diabetes mellitus without complication (HCC)    Essential hypertension    Fibromyalgia    Tobacco abuse    Past Surgical History:  Past Surgical History:  Procedure Laterality Date   CAROTID PTA/STENT INTERVENTION Left 01/24/2022   Procedure: CAROTID PTA/STENT INTERVENTION;  Surgeon: [name], MD;  Location: [facility];  Service: Cardiovascular;  Laterality: Left;   CAROTID PTA/STENT INTERVENTION Left 02/28/2022   Procedure: CAROTID PTA/STENT INTERVENTION;  Surgeon: [name], MD;  Location: [facility];  Service: Cardiovascular;  Laterality: Left;   CERVICAL BIOPSY     CHOLECYSTECTOMY     ENDARTERECTOMY Left 01/25/2022   Procedure: ENDARTERECTOMY CAROTID, possible ligation;  Surgeon: [name], MD;  Location: [facility];  Service: Vascular;  Laterality: Left;   ENDARTERECTOMY Left 02/25/2022   Procedure: ENDARTERECTOMY CAROTID  revision;  Surgeon: Bertram Denver, MD;  Location: [facility];  Service: Vascular;  Laterality: Left;   HERNIA REPAIR     LEFT HEART CATH AND CORONARY ANGIOGRAPHY Left 07/05/2019   Procedure: LEFT HEART CATH AND CORONARY ANGIOGRAPHY;  Surgeon: Iran Ouch, MD;  Location: [facility];  Service: Cardiovascular;  Laterality: Left;   HPI:  Per Physician's H&P "Patient s/p LEFT CEA on 01/25/2022 at our facility secondary to TIA and high grade stenosis. The patient had been well, presented for follow up without issue. Noted on 02/20/2022 she developed sudden swelling, pain and murky drainage from the incision. She was evaluated by urgent care on 02/22/2022 and it was felt she had a wound infection. She was treated with ABX. Last night the patient developed significant bleeding and hypotension to the 70s. Taken by EMS to [facility], digital pressure held, transfused pRBC. Neurologically intact. Initial [facility] patient was [facility], with active bleeding. That facility and Vascular surgeon did not accept the patient. I accepted the patient for emergent intervention. Upon arrival EMS was holding pressure at neck incision, the patient was awake, alert and mentating and aware of situation. She Understood significant risks of bleeding, stroke and death. Consent obtained.".     CXR on 03/03/2022: while still orally intubated, No acute cardiopulmonary disease identified.  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 Petechial hemorrhage  at this site. Local mass effect without significant ventricular  effacement. No midline shift.     Multiple smaller acute/early subacute cortical and subcortical  infarcts within the left frontoparietal lobes, posterior left  temporal lobe and lateral  left occipital lobe (left MCA vascular  territory, as well as left MCA/ACA and left MCA/PCA watershed  territories). Petechial hemorrhage associated with some of these  infarcts. 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   Assessment / Plan / Recommendation Clinical Impression  Pt seen for cognitive-linguistic evaluation. Pt received in bed. Pt alert. Assessment completed via informal means.   Pt presents with cognitive-linguistic deficits affecting attention (sustained, visual), memory (immediate, short term, functional), problem solving (verbal/non-verbal), abstract reasoning, insight, and spatial/temporal/situations orientation. Attention affecting auditory comprehension (basic and complex) and verbal expression (basic and complex). Pt with impaired pragmatic language including reduced eye contact, initiation, and turn taking/topic maintenance. Pt easily distracted by environmental stimuli. Pt benefited from reduced environmental distractors, verbal/tactile cueing, and repetition of stimuli to improve sustained attention. Additionally, pt with reduced speech intelligibility due to reduced vocal loudness and hoarse vocal quality. Verbal cueing did not improve intelligibility.  Based on today's assessment, anticipate need for frequent/constant supervisions, assistance with ADLs/iADLs, and post-acute SLP services.  SLP to f/u per POC for continued cognitive-linguistic tx.   Pt and RN made aware of results, recommendations, and SLP POC. ?full understanding by pt.     SLP Assessment  SLP Recommendation/Assessment: Patient needs continued Speech Lanaguage Pathology Services SLP Visit Diagnosis: Attention and concentration deficit;Frontal lobe and executive function deficit;Cognitive communication deficit (R41.841)    Recommendations for follow up therapy are one component of a multi-disciplinary discharge planning  process, led by the attending physician.  Recommendations may be updated based on patient status, additional functional criteria and insurance authorization.    Follow Up Recommendations  Skilled nursing-short term rehab (<3 hours/day)    Assistance Recommended at Discharge  Frequent or constant Supervision/Assistance  Functional Status Assessment Patient has had a recent decline in their functional status and demonstrates the ability to make significant improvements in function in a reasonable and predictable amount of time.  Frequency and Duration min 2x/week         SLP Evaluation Cognition  Overall Cognitive Status: Impaired/Different from baseline Arousal/Alertness: Awake/alert Orientation Level: Oriented to person;Disoriented to place;Disoriented to time;Disoriented to situation Memory: Impaired Memory Impairment: Storage deficit;Retrieval deficit Awareness: Impaired Awareness Impairment: Intellectual impairment Problem Solving: Impaired Problem Solving Impairment: Verbal basic Executive Function: Reasoning;Sequencing;Initiating Reasoning: Impaired Reasoning Impairment: Verbal basic Sequencing: Impaired Sequencing Impairment: Verbal basic Initiating: Impaired Initiating Impairment: Verbal basic Behaviors: Restless Safety/Judgment: Impaired       Comprehension  Auditory Comprehension Overall Auditory Comprehension: Impaired Yes/No Questions:  (inconsistent basic and complex) Commands: Impaired One Step Basic Commands:  (inconsistent; 4/5) Two Step Basic Commands:  (inconsistent; 1/4) Interfering Components: Attention EffectiveTechniques: Extra processing time;Repetition (reducing environmental distractions) Visual Recognition/Discrimination Discrimination: Not tested Reading Comprehension Reading Status: Not tested    Expression Expression Primary Mode of Expression: Verbal Verbal Expression Overall Verbal Expression: Impaired Initiation: Impaired Automatic  Speech: Social Response Repetition: Impaired Level of Impairment:  (inconsistent; suspect due to attention) Naming: Impairment Responsive:  (suspect due to attention) Confrontation:  (inconsistent; suspect due to attention) Pragmatics: Impairment Impairments: Abnormal affect;Eye contact;Topic maintenance;Topic appropriateness;Turn Taking Interfering Components: Attention Written Expression Dominant Hand: Right Written Expression: Not tested   Oral / Motor  Oral Motor/Sensory Function Overall Oral Motor/Sensory Function: Moderate impairment Facial ROM: Reduced left Facial Symmetry: Abnormal  symmetry left Facial Strength: Reduced left Facial Sensation: Reduced left Lingual ROM: Reduced left Lingual Symmetry: Within Functional Limits Lingual Strength: Reduced Motor Speech Overall Motor Speech: Impaired Respiration: Impaired (reduced vocal loudness; hoarseness) Phonation: Low vocal intensity Resonance: Within functional limits Articulation: Within functional limitis Intelligibility: Intelligibility reduced Phrase: 50-74% accurate Sentence: 50-74% accurate Conversation: 50-74% accurate Motor Planning: Witnin functional limits             Clyde Canterbury, M.S., CCC-SLP Speech-Language Pathologist [facility] [phone] ([facility])   Woodroe Chen 03/10/2022, 1:41 PM

## 2022-03-10 NOTE — Progress Notes (Addendum)
Physical Therapy Treatment ?Patient Details ?Name: Veronica Murray ?MRN: [id number] ?DOB: 10/19/1956 ?Today's Date: 03/10/2022 ? ? ?History of Present Illness 66 yo F transferred emergently to [facility] ED for emergent vascular intervention on a ruptured carotid endarterectomy wound. Intubated on 4/16, extubated on 4/24. 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 Petechial hemorrhage at this site."  Hospital course significant for L neck exploration with surture repair to L carotid (02/24/22) and placement of embolic protective device L carotid (02/28/22) ? ?  ?PT Comments  ? ? Pt seen for PT tx with pt received in bed reporting she's confused today with PT providing encouragement & orientation throughout session. Pt is able to complete supine>sit but does have BUE support & HOB elevated to do so. Pt is able to ambulate 2 laps around nurses station without AD with seated rest break in between. During gait pt requires min assist but does have several LOB requiring mod assist to correct. Pt appears to have R gaze preference but can locate PT on L with cued to do so. Pt externally & internally distracted when ambulating in hallway with PT providing cuing for sustained attention to task. Per chart, pt & spouse are declining CIR so d/c recommendations have been updated to reflect this. Will continue to follow pt acutely to address balance, neuromuscular re-education, and gait with LRAD. ? ?Addendum: late entry - PT notified pt's nurse yesterday of pt's L neck incision appearing to drain brown liquid. Nurse reports she's already aware. ? ?   ?Recommendations for follow up therapy are one component of a multi-disciplinary discharge planning process, led by the attending physician.  Recommendations may be updated based on patient status, additional functional criteria and insurance  authorization. ? ?Follow Up Recommendations ? Skilled nursing-short term rehab (<3 hours/day) ?  ?  ?Assistance Recommended at Discharge Frequent or constant Supervision/Assistance  ?Patient can return home with the following A lot of help with walking and/or transfers;A lot of help with bathing/dressing/bathroom;Assistance with cooking/housework;Direct supervision/assist for financial management;Assist for transportation;Direct supervision/assist for medications management;Help with stairs or ramp for entrance ?  ?Equipment Recommendations ? Rolling walker (2 wheels)  ?  ?Recommendations for Other Services   ? ? ?  ?Precautions / Restrictions Precautions ?Precautions: Fall ?Restrictions ?Weight Bearing Restrictions: No  ?  ? ?Mobility ? Bed Mobility ?Overal bed mobility: Needs Assistance ?Bed Mobility: Supine to Sit ?  ?  ?Supine to sit: Min assist, HOB elevated (Cuing to hold to bed rail & pt also holds to PT's hand with other UE; HOB elevated ~half way) ?  ?  ?  ?  ? ?Transfers ?Overall transfer level: Needs assistance ?Equipment used: 1 person hand held assist ?Transfers: Sit to/from Stand ?Sit to Stand: Min assist ?  ?  ?  ?  ?  ?  ?  ? ?Ambulation/Gait ?Ambulation/Gait assistance: Min assist, Mod assist ?Gait Distance (Feet): 180 Feet (+ 180 ft) ?Assistive device: None ?Gait Pattern/deviations: Decreased step length - right, Decreased step length - left, Decreased weight shift to left ?Gait velocity: decreased ?  ?  ?General Gait Details: Decreased L hip flexion during swing phase. ? ? ?Stairs ?  ?  ?  ?  ?  ? ? ?Wheelchair Mobility ?  ? ?Modified Rankin (Stroke Patients Only) ?  ? ? ?  ?Balance Overall balance assessment: Needs assistance ?  ?Sitting balance-Leahy Scale: Good ?Sitting balance -  Comments: Pt able to don sock sitting EOB without LOB with supervision ?  ?Standing balance support: No upper extremity supported ?Standing balance-Leahy Scale: Poor ?  ?  ?  ?  ?  ?  ?  ?  ?  ?  ?  ?  ?  ? ?   ?Cognition Arousal/Alertness: Awake/alert ?Behavior During Therapy: Flat affect, Restless ?Overall Cognitive Status: Impaired/Different from baseline ?Area of Impairment: Orientation, Attention, Memory, Following commands, Safety/judgement, Awareness, Problem solving ?  ?  ?  ?  ?  ?  ?  ?  ?Orientation Level: Disoriented to, Situation, Time ?  ?Memory: Decreased short-term memory ?Following Commands: Follows one step commands with increased time, Follows one step commands inconsistently ?Safety/Judgement: Decreased awareness of deficits, Decreased awareness of safety ?Awareness: Intellectual ?Problem Solving: Slow processing, Decreased initiation, Difficulty sequencing, Requires verbal cues, Requires tactile cues ?General Comments: Impaired sustained attention to tasks ?  ?  ? ?  ?Exercises   ? ?  ?General Comments   ?  ?  ? ?Pertinent Vitals/Pain Pain Assessment ?Pain Assessment: No/denies pain  ? ? ?Home Living   ?  ?  ?  ?  ?  ?  ?  ?  ?  ?   ?  ?Prior Function    ?  ?  ?   ? ?PT Goals (current goals can now be found in the care plan section) Acute Rehab PT Goals ?Patient Stated Goal: get better ?PT Goal Formulation: With patient ?Time For Goal Achievement: 03/18/22 ?Potential to Achieve Goals: Good ?Progress towards PT goals: Progressing toward goals ? ?  ?Frequency ? ? ? 7X/week ? ? ? ?  ?PT Plan Discharge plan needs to be updated  ? ? ?Co-evaluation   ?  ?  ?  ?  ? ?  ?AM-PAC PT "6 Clicks" Mobility   ?Outcome Measure ? Help needed turning from your back to your side while in a flat bed without using bedrails?: A Little ?Help needed moving from lying on your back to sitting on the side of a flat bed without using bedrails?: A Little ?Help needed moving to and from a bed to a chair (including a wheelchair)?: A Little ?Help needed standing up from a chair using your arms (e.g., wheelchair or bedside chair)?: A Little ?Help needed to walk in hospital room?: A Lot ?Help needed climbing 3-5 steps with a railing? :  A Lot ?6 Click Score: 16 ? ?  ?End of Session   ?Activity Tolerance: Patient tolerated treatment well ?Patient left: in chair;with chair alarm set;with call bell/phone within reach;with nursing/sitter in room ?Nurse Communication: Mobility status ?PT Visit Diagnosis: Muscle weakness (generalized) (M62.81);Difficulty in walking, not elsewhere classified (R26.2);Hemiplegia and hemiparesis;Unsteadiness on feet (R26.81) ?Hemiplegia - Right/Left: Left ?Hemiplegia - dominant/non-dominant: Non-dominant ?Hemiplegia - caused by: Cerebral infarction ?  ? ? ?Time: 4709-6283 ?PT Time Calculation (min) (ACUTE ONLY): 23 min ? ?Charges:  $Therapeutic Activity: 8-22 mins ?$Neuromuscular Re-education: 8-22 mins          ?          ? ?Aleda Grana, PT, DPT ?03/10/22, 8:12 AM ? ? ? ?Sandi Mariscal ?03/10/2022, 1:14 PM ? ?

## 2022-03-11 ENCOUNTER — Inpatient Hospital Stay: Payer: Medicare HMO

## 2022-03-11 DIAGNOSIS — Z9889 Other specified postprocedural states: Secondary | ICD-10-CM | POA: Diagnosis not present

## 2022-03-11 LAB — GLUCOSE, CAPILLARY
Glucose-Capillary: 282 mg/dL — ABNORMAL HIGH (ref 70–99)
Glucose-Capillary: 239 mg/dL — ABNORMAL HIGH (ref 70–99)
Glucose-Capillary: 247 mg/dL — ABNORMAL HIGH (ref 70–99)
Glucose-Capillary: 256 mg/dL — ABNORMAL HIGH (ref 70–99)
Glucose-Capillary: 240 mg/dL — ABNORMAL HIGH (ref 70–99)

## 2022-03-11 LAB — COMPREHENSIVE METABOLIC PANEL
ALT: 13 U/L (ref 0–44)
AST: 17 U/L (ref 15–41)
Albumin: 2.5 g/dL — ABNORMAL LOW (ref 3.5–5.0)
Alkaline Phosphatase: 82 U/L (ref 38–126)
Anion gap: 6 (ref 5–15)
BUN: 19 mg/dL (ref 8–23)
CO2: 26 mmol/L (ref 22–32)
Calcium: 8.3 mg/dL — ABNORMAL LOW (ref 8.9–10.3)
Chloride: 102 mmol/L (ref 98–111)
Creatinine, Ser: 0.39 mg/dL — ABNORMAL LOW (ref 0.44–1.00)
GFR, Estimated: >60 mL/min (ref >60)
Glucose, Bld: 279 mg/dL — ABNORMAL HIGH (ref 70–99)
Potassium: 4.1 mmol/L (ref 3.5–5.1)
Sodium: 134 mmol/L — ABNORMAL LOW (ref 135–145)
Total Bilirubin: 0.5 mg/dL (ref 0.3–1.2)
Total Protein: 5.9 g/dL — ABNORMAL LOW (ref 6.5–8.1)

## 2022-03-11 LAB — CBC
HCT: 27.5 % — ABNORMAL LOW (ref 36.0–46.0)
Hemoglobin: 9.4 g/dL — ABNORMAL LOW (ref 12.0–15.0)
MCH: 30.4 pg (ref 26.0–34.0)
MCHC: 34.2 g/dL (ref 30.0–36.0)
MCV: 89 fL (ref 80.0–100.0)
Platelets: 508 10*3/uL — ABNORMAL HIGH (ref 150–400)
RBC: 3.09 MIL/uL — ABNORMAL LOW (ref 3.87–5.11)
RDW: 13 % (ref 11.5–15.5)
WBC: 7.3 10*3/uL (ref 4.0–10.5)
nRBC: 0 % (ref 0.0–0.2)

## 2022-03-11 LAB — TRIGLYCERIDES: Triglycerides: 101 mg/dL (ref <150)

## 2022-03-11 LAB — MAGNESIUM: Magnesium: 1.9 mg/dL (ref 1.7–2.4)

## 2022-03-11 LAB — PHOSPHORUS: Phosphorus: 3.8 mg/dL (ref 2.5–4.6)

## 2022-03-11 MED ORDER — INSULIN GLARGINE-YFGN 100 UNIT/ML ~~LOC~~ SOLN
15.0000 [IU] | Freq: Every day | SUBCUTANEOUS | Status: DC
  Administered 2022-03-11: 15 [IU] via SUBCUTANEOUS
  Filled 2022-03-11 (×2): qty 0.15

## 2022-03-11 MED ORDER — TRAVASOL 10 % IV SOLN
INTRAVENOUS | Status: AC
  Filled 2022-03-11: qty 1062

## 2022-03-11 MED ORDER — NICOTINE 21 MG/24HR TD PT24
21.0000 mg | MEDICATED_PATCH | Freq: Every day | TRANSDERMAL | Status: DC
  Administered 2022-03-11 – 2022-03-15 (×5): 21 mg via TRANSDERMAL
  Filled 2022-03-11 (×5): qty 1

## 2022-03-11 MED ORDER — ENSURE ENLIVE PO LIQD
237.0000 mL | Freq: Two times a day (BID) | ORAL | Status: DC
  Administered 2022-03-12 – 2022-03-15 (×7): 237 mL via ORAL

## 2022-03-11 NOTE — Progress Notes (Signed)
Speech Language Pathology Treatment:    ?Patient Details ?Name: Veronica Murray ?MRN: [id number] ?DOB: April 28, 1956 ?Today's Date: 03/11/2022 ?Time: [id number] ?SLP Time Calculation (min) (ACUTE ONLY): 10 min ? ?Assessment / Plan / Recommendation ?Clinical Impression ? Pt seen for diet tolerance and trials of upgraded textures. Pt alert, distractible. R gaze preference. Sitter at bedside. ? ?Observed with trials of puree, nectar-thick liquids, and thin liquids. Pt with s/sx moderate oral dysphagia c/b intermittent oral holding across trials and anterior loss of thin liquids via cup sip. Pharyngeally, pt with seemingly inconsistently delayed swallow initiation (vs AP transit). No overt or subtle s/sx pharyngeal dysphagia.  ? ?Recommend continuation of pureed diet with nectar-thick liquids and safe swallowing stategies/aspiration precuations as outlined below. ? ?MBSS tentatively scheduled for today given pt's increased risk for silent aspiration in setting of stroke and recent/prolonged intubation.  ? ?Pt, RN, MD, and NT made aware of results, recommendations, and SLP POC. Pt agreeable to recommendations and pursuing MBSS this date.  ?  ?HPI HPI: Per 61 H&P "Patient s/p LEFT CEA on 01/25/2022 at our facility secondary to TIA and high grade stenosis. The patient had been well, presented for follow up without issue. Noted on 02/20/2022 she developed sudden swelling, pain and murky drainage from the incision. She was evaluated by urgent care on 02/22/2022 and it was felt she had a wound infection. She was treated with ABX. Last night the patient developed significant bleeding and hypotension to the 70s. Taken by EMS to [facility], digital pressure held, transfused pRBC. Neurologically intact. [facility] Ver[facility], with active bleeding. That facility and Vascular surgeon did not accept the patient. I accepted  the patient for emergent intervention. Upon arrival EMS was holding pressure at neck incision, the patient was awake, alert and mentating and aware of situation. She Understood significant risks of bleeding, stroke and death. Consent obtained.".     CXR on 03/03/2022: while still orally intubated, No acute cardiopulmonary disease identified.  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 Petechial hemorrhage  at this site. Local mass effect without significant ventricular  effacement. No midline shift.     Multiple smaller acute/early subacute cortical and subcortical  infarcts within the left frontoparietal lobes, posterior left  temporal lobe and lateral left occipital lobe (left MCA vascular  territory, as well as left MCA/ACA and left MCA/PCA watershed  territories). Petechial hemorrhage associated with some of these  infarcts. 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 ?  ?   ?SLP Plan ? MBS (will be completed today) ? ?  ?  ?Recommendations for follow up therapy are one component of a multi-disciplinary discharge planning process, led by the attending physician.  Recommendations may be updated based on patient status, additional functional criteria and insurance authorization. ?  ? ?Recommendations  ?Diet recommendations: Dysphagia 1 (puree);Nectar-thick liquid ?Liquids provided via: Cup;Straw ?Medication Administration: Crushed with puree ?Supervision: Patient able to self feed;Full supervision/cueing for compensatory strategies ?Compensations: Minimize environmental distractions;Slow rate;Small sips/bites;Lingual sweep for clearance of pocketing;Follow solids with liquid ?Postural Changes and/or Swallow Maneuvers: Out of bed for meals;Seated upright 90 degrees;Upright 30-60 min after meal  ?    ?    ?   ? ? ? ?  Oral Care Recommendations: Oral care BID;Oral care before and after PO;Patient independent with oral care;Staff/trained caregiver to provide oral care ?Follow Up Recommendations: Skilled nursing-short term rehab (<3 hours/day) ?Assistance recommended at discharge: Frequent or constant Supervision/Assistance ?SLP Visit Diagnosis: Dysphagia, oropharyngeal phase (R13.12) ?Plan: MBS (will be completed today) ? ? ? ? ?  ?  ? ?Cherrie Gauze, M.S., CCC-SLP ?Speech-Language Pathologist ?[facility] ?[phone] ([place])  ? ?Quintella Baton ? ?03/11/2022, 1:47 PM ?

## 2022-03-11 NOTE — Progress Notes (Signed)
?PROGRESS NOTE ? ? ?HPI was taken from Dr. Lorenso Courier: ?History of Present Illness: Patient s/p LEFT CEA on 01/25/2022 at our facility secondary to TIA and high grade stenosis. The patient had been well, presented for follow up without issue. Noted on 02/20/2022 she developed sudden swelling, pain and murky drainage from the incision. She was evaluated by urgent care on 02/22/2022 and it was felt she had a wound infection. She was treated with ABX. Last night the patient developed significant bleeding and hypotension to the 70s. Taken by EMS to [facility], digital pressure held, transfused pRBC. Neurologically intact. Initial [facility] by Forestine [facility], with active bleeding. That facility and Vascular surgeon did not accept the patient. I accepted the patient for emergent intervention. Upon arrival EMS was holding pressure at neck incision, the patient was awake, alert and mentating and aware of situation. She Understood significant risks of bleeding, stroke and death. Consent obtained. ?  ?As per Dr. Mortimer Fries: ?02/25/22: Admit to ICU s/p left carotid artery repair in the setting of endarterectomy carotid rupture due to left carotid patch infection remained mechanically intubated postop ?02/28/22: Pt underwent left carotid artery stent placement  ?02/28/22: Will perform SBT with plans for possible extubation.  Pt unable to follow commands during WUA CT Head revealed subacute right MCA stroke as well as recent but older-appearing left MCA strokes x2.  Neurology consulted.  Unable to extubate  ?4/23 MRI brain: Moderate-to-large acute/early subacute right MCA territory infarct within the cortical/subcortical right frontal lobe as well as right insula and subinsular region. Petechial hemorrhage at this site. Local mass effect without significant ventricular ?4/23 NEUROLOGY CONSULTED ? ? ?ISABELLAH FERREBEE  [id number] DOB: [birth year] DOA:  02/24/2022 ?PCP: Patient, No Pcp Per (Inactive) ? ? ?Assessment & Plan: ?  ?Principal Problem: ?  Post-operative state ?Active Problems: ?  Rupture of carotid artery ([place]) ?  CVA (cerebral vascular accident) [facility]) ?  DM2 (diabetes mellitus, type 2) ([place]) ?  Hypokalemia ?  Dysphagia following cerebral infarction ?  Normocytic anemia ?  Leukocytosis ?  Thrombocytosis ?  Hyponatremia ?  Hip pain ? ?Assessment and Plan: ? ?Acute hypoxic & hypercapnic respiratory failure: s/p intubation, ventilation & extubation. Now weaned off o2, breathing comfortably. At high risk for aspiration ? ?Rupture left carotid: from infected graft, now pod 11 from exploration for bleeding and pod 7 from left ICA stent. Stable, vascular surgery following. Continuing aspirin and plavix. No signs re-bleeding.  ? ?Strep constellatus infection: endovascular infection, ID following, advising at least 6 wks IV abx. Advising continue unasyn for now, switch to ceftriaxone as OPAT, through 5/28 ? ?Diarrhea? Sitter says small volume diarrhea on 4/28. Nursing collected loose stool, gi pathogen panel neg. No fever, no leukocytosis, and no diarrhea today, thus holding off on further testing. ? ?Abdominal pain: labs and CT imaging unremarkable save for distended bladder though patient able to void spontaneously, ua not suggestive of infection, PVR wnl. Will continue to monitor. ? ?Hyponatremia: resolved ? ?CVA: MRI brain 4/22 shows multiple small acute/early subacute cortical & subcortical infarcts w/ petechial hemorrhage associated w/ some of these infarcts. Chronic infarcts also noted. Now on aspirin and statin. Needs plavix when can take po. Echo shows EF 60-65%, no regional wall motion abnormalities, diastolic function is normal, & atrial septum is grossly normal. Neuro recs apprec, they have signed off. Repeat head ct on 4/28 stable cva, no hemorrhage.  CIR in process of evaluation. Otherwise snf ? ?Dysphagia: remains at high risk for aspiration. Has  self-discontinued NG tube several times. IR declines to place again due to this. Patient agreeable to peg tube. Plan was placement tomorrow but over weekend has started swallowing, slp is advancing diet today. Continuing tpn for the time being. .  ? ?Hip pain: no fall. Pain resolved. X-ray 4/26 w/o signs fracture ? ?DM2: glucose elevated with tpn, will increase semglee to 15, continue SSI ? ?Hypokalemia: monitor and replace as needed.  ? ?Normocytic anemia: H&H are stable. Will transfuse if Hb < 7.0 ? ? ? ? ? ?DVT prophylaxis: lovenox ?Code Status:  full  ?Family Communication: daughters updated bedside 4/30. Message left w/ daughter robin today ?Disposition Plan: cir vs snf ? ?Level of care: Med-Surg ? ?Status is: Inpatient ?Remains inpatient appropriate because: severity of illness ? ? ? ?Consultants:  ?ICU ?Vasc surg  ?Neuro  ? ?Procedures: ? ?Antimicrobials: unasyn  ? ?Subjective: ?No complaints. No diarrhea. Voiding spontaneously. Tolerated some food today. ? ?Objective: ?Vitals:  ? 03/10/22 2338 03/11/22 0500 03/11/22 0544 03/11/22 0729  ?BP: (!) 138/59  130/64 (!) 147/66  ?Pulse: 92  87 93  ?Resp: 17  17 16   ?Temp: 98.6 ?F (37 ?C)  98.8 ?F (37.1 ?C) 98.6 ?F (37 ?C)  ?TempSrc: Oral   Oral  ?SpO2: 96%  96% 97%  ?Weight:  75.3 kg    ?Height:      ? ? ?Intake/Output Summary (Last 24 hours) at 03/11/2022 1221 ?Last data filed at 03/11/2022 1007 ?Gross per 24 hour  ?Intake 1923.53 ml  ?Output 800 ml  ?Net 1123.53 ml  ? ? ?Filed Weights  ? 03/07/22 0418 03/09/22 0416 03/11/22 0500  ?Weight: 77.8 kg 74.7 kg 75.3 kg  ? ? ?Examination: ? ?General exam: Appears calm and comfortable  ?Respiratory system: diminished breath sounds b/l ?Cardiovascular system: S1 & S2 +. No  rubs, gallops or clicks.  ?Gastrointestinal system: Abdomen is nondistended, soft and nontender. Normal bowel sounds heard. ?Central nervous system: awake, alert, slow to respond. Moving all 4 ext ?Psychiatry: calm ? ? ? ?Data Reviewed: I have personally  reviewed following labs and imaging studies ? ?CBC: ?Recent Labs  ?Lab 03/07/22 ?1859 03/07/22 ?2047 03/09/22 ?0510 03/10/22 ?[id number] 03/11/22 ?[id number]  ?WBC 5.6 7.5 5.4 5.8 7.3  ?NEUTROABS 3.3 4.6  --   --   --   ?HGB 7.0* 9.8* 9.0* 8.6* 9.4*  ?HCT 20.6* 28.9* 26.9* 26.0* 27.5*  ?MCV 88.0 86.3 87.9 88.7 89.0  ?PLT 437* 617* 599* 497* 508*  ? ?Basic Metabolic Panel: ?Recent Labs  ?Lab 03/07/22 ?0435 03/07/22 ?2047 03/08/22 ?1311 03/09/22 ?0510 03/10/22 ?[id number] 03/11/22 ?[id number]  ?NA 133* 136 135 134* 133* 134*  ?K 3.0* 3.6 3.4* 3.1* 3.6 4.1  ?CL 97* 101 98 98 101 102  ?CO2 28 25 27 28 28 26   ?GLUCOSE 207* 160* 129* 218* 218* 279*  ?BUN 11 11 11 13 13 19   ?CREATININE 0.36* 0.40* 0.44 0.36* 0.45 0.39*  ?CALCIUM 8.1* 8.3* 8.6* 8.2* 8.1* 8.3*  ?MG 1.9  --  1.9 1.9 1.8 1.9  ?PHOS 3.1  --  3.7 3.5 3.8 3.8  ? ?GFR: ?Estimated Creatinine Clearance: 68.4 mL/min (A) (by C-G formula based on SCr of 0.39 mg/dL (L)). ?Liver Function Tests: ?Recent Labs  ?Lab 03/07/22 ?2047 03/08/22 ?1311 03/09/22 ?0510 03/11/22 ?0458  ?AST 22 23 18 17   ?ALT 24 22 19 13   ?ALKPHOS 115 116 100 82  ?BILITOT  0.8 0.8 0.5 0.5  ?PROT 5.9* 6.2* 5.7* 5.9*  ?ALBUMIN 2.5* 2.6* 2.4* 2.5*  ? ?No results for input(s): LIPASE, AMYLASE in the last 168 hours. ?No results for input(s): AMMONIA in the last 168 hours. ?Coagulation Profile: ?Recent Labs  ?Lab 03/08/22 ?1311  ?INR 1.1  ? ?Cardiac Enzymes: ?No results for input(s): CKTOTAL, CKMB, CKMBINDEX, TROPONINI in the last 168 hours. ?BNP (last 3 results) ?No results for input(s): PROBNP in the last 8760 hours. ?HbA1C: ?No results for input(s): HGBA1C in the last 72 hours. ?CBG: ?Recent Labs  ?Lab 03/10/22 ?1941 03/10/22 ?2334 03/11/22 ?[id number] 03/11/22 ?[id number] 03/11/22 ?1156  ?GLUCAP 239* 226* 282* 239* 247*  ? ?Lipid Profile: ?Recent Labs  ?  03/09/22 ?0510 03/11/22 ?0458  ?TRIG 105 101  ? ?Thyroid Function Tests: ?No results for input(s): TSH, T4TOTAL, FREET4, T3FREE, THYROIDAB in the last 72 hours. ?Anemia Panel: ?No results  for input(s): VITAMINB12, FOLATE, FERRITIN, TIBC, IRON, RETICCTPCT in the last 72 hours. ?Sepsis Labs: ?Recent Labs  ?Lab 03/07/22 ?1751  ?LATICACIDVEN 0.9  ? ? ? ?Recent Results (from the past 240 hour(s

## 2022-03-11 NOTE — Consult Note (Signed)
PHARMACY - TOTAL PARENTERAL NUTRITION CONSULT NOTE  ? ?Indication:  severe disphagia, not tolerating enteral feedings ? ?Patient Measurements: ?Height: 5\' 2"  (157.5 cm) ?Weight: 75.3 kg (166 lb) ?IBW/kg (Calculated) : 50.1 ?TPN AdjBW (KG): 56.9 ?Body mass index is 30.36 kg/m?. ?Usual Weight: 82.9 kg on 03/04/22 ? ?Assessment: Pharmacy has been consulted to initiate TPN in 65yo patient admitted s/p left CEA on 01/25/22 secondary to TIA and high grade stenosis. Patient had been doing well, but started developing sudden swelling, pain and murky drainage from incision on 02/20/22. 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 Due to left carotid patch infection, patient remained intubated. On 4/20, underwent left carotid artery stent placement. Patient still having dysphagia and at high risk for aspiration. Has self-discontinued NG tube several times. IR has declined to place again d/t this. General surgery planning to place gastrostomy tube this coming Tuesday, 03/12/22 ? ?Glucose / Insulin: BG 168 - 279 / SSI 0-20 units,  ?Last 24h: 47 units ?MD added Lantus 10 units at bedtime on 4/30 ? ?Electrolytes: electrolytes wnl ?Renal: SCR at baseline ?Hepatic: AST/ALT wnl ?Intake / Output; MIVF:   ?GI Imaging:  ?1. Distended urinary bladder to the umbilicus, suspicious for urinary retention. No bladder wall thickening. ?2. Small to moderate left and small right pleural effusions with associated compressive atelectasis. Mild generalized body wall edema ?in the flanks. ?3. Minimal wall thickening of the distal esophagus, can be seen with reflux or esophagitis. ? ?GI Surgeries / Procedures:  ?Plan for G-tube placement on 5/2 ? ?Central access: PICC placed 4/28 ?TPN start date: 4/28  ? ?Nutritional Goals: ?Goal TPN rate is 75 mL/hr (provides 106.2 g of protein and 1810.8 kcals per day) ? ?RD Assessment: ?Estimated Needs ?Total Energy Estimated Needs: 1800-2100kcal/day ?Total Protein Estimated  Needs: 90-105g/day ?Total Fluid Estimated Needs: 1.5-1.8L/day ? ?Current Nutrition:  ?Dysphagia 1 diet ordered 4/29 ? ?Plan:  ?Continue TPN at goal rate of 75 mL/hr (total volume including overfill 1900 mL) ?Electrolytes in TPN: Na 38mEq/L, K 59mEq/L, Ca 28mEq/L, Mg 44mEq/L, and Phos 26mmol/L. Cl:Ac 1:1 ?Add standard MVI and trace elements to TPN ?Nutritional components ?Amino acids (using Travasol 10%): 106.2 grams ?Dextrose: 270 grams ?Lipids (using 20% SMOFlipids): 46.8 grams ?kCal: 1810.8 ?continue Resistant q4h SSI and adjust as needed  ?Lantus 5 units added 4/29 evening by MD. Based on total SSI insulin given(last 24 hrs) of 47 units, will increase to 15 units in evening for 05/01 ?Monitor TPN labs on Mon/Thurs, currently ordered as daily until electrolytes stabilize. ? ?Dallie Piles ?03/11/2022,7:34 AM ? ?

## 2022-03-11 NOTE — Progress Notes (Signed)
Occupational Therapy Treatment ?Patient Details ?Name: Veronica Murray ?MRN: [id number] ?DOB: 05/20/1956 ?Today's Date: 03/11/2022 ? ? ?History of present illness 66 yo F transferred emergently to [facility] ED for emergent vascular intervention on a ruptured carotid endarterectomy wound. Intubated on 4/16, extubated on 4/24. 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 Petechial hemorrhage at this site."  Hospital course significant for L neck exploration with surture repair to L carotid (02/24/22) and placement of embolic protective device L carotid (02/28/22) ?  ?OT comments ? Veronica Murray was seen for OT treatment on this date. Upon arrival to room pt seated on [facility], siter at bedside, pt agreeable to tx. Pt requires MIN A for BSC t/f with no AD - cues for hand placement and lines mgmt. MIN A clothing mgmt and pericare in standing - assist for balance and pulling up briefs on L side. MIN A + MOD cues hand washing standing sink side. MOD A hair brushing - pt attempts to brush hair with comb inside of ziploc bag and unable to recognize or correct error with cues. Pt unable to locate washcloth at far left peripheral vission with MAX cues - pt uses a packaged underwear liner as facecloth, does not ID error. MIN A wash face, cues for LUE integration. Session limited by transport arriving and pt needing to return to bed. Pt making good progress toward goals. Will continue to follow POC. Discharge recommendation remains appropriate.  ?  ? ?Recommendations for follow up therapy are one component of a multi-disciplinary discharge planning process, led by the attending physician.  Recommendations may be updated based on patient status, additional functional criteria and insurance authorization. ?   ?Follow Up Recommendations ? Acute inpatient rehab (3hours/day)  ?  ?Assistance Recommended at  Discharge Frequent or constant Supervision/Assistance  ?Patient can return home with the following ? Assistance with cooking/housework;Assist for transportation;Direct supervision/assist for medications management;Direct supervision/assist for financial management;Help with stairs or ramp for entrance;A lot of help with walking and/or transfers;A lot of help with bathing/dressing/bathroom ?  ?Equipment Recommendations ? BSC/3in1  ?  ?Recommendations for Other Services   ? ?  ?Precautions / Restrictions Precautions ?Precautions: Fall ?Restrictions ?Weight Bearing Restrictions: No  ? ? ?  ? ?Mobility Bed Mobility ?Overal bed mobility: Modified Independent ?Bed Mobility: Sit to Supine ?  ?  ?  ?Sit to supine: Min assist ?  ?  ?  ? ?Transfers ?Overall transfer level: Needs assistance ?Equipment used: None ?Transfers: Sit to/from Stand ?Sit to Stand: Min assist ?  ?  ?  ?  ?  ?  ?  ?  ?Balance Overall balance assessment: Needs assistance ?Sitting-balance support: No upper extremity supported, Feet supported ?Sitting balance-Leahy Scale: Good ?  ?  ?Standing balance support: No upper extremity supported, During functional activity ?Standing balance-Leahy Scale: Poor ?  ?  ?  ?  ?  ?  ?  ?  ?  ?  ?  ?  ?   ? ?ADL either performed or assessed with clinical judgement  ? ?ADL Overall ADL's : Needs assistance/impaired ?  ?  ?  ?  ?  ?  ?  ?  ?  ?  ?  ?  ?  ?  ?  ?  ?  ?  ?  ?General ADL Comments: MIN A for BSC t/f with no AD - cues for hand placement and lines  mgmt. MIN A clothing mgmt and pericare in standing - assist for balance and pulling up briefs on L side. MIN A + MOD cues hand washing standing sink side. MOD A hair brushing - pt attempts to brush hair with comb inside of ziploc bag and unable to recognize or correct error with cues. Pt unable to locate washcloth at far left peripheral vission with MAX cues - pt uses a packaged underwear liner as facecloth, does not ID error. MIN A wash face, cues for LUE integration. ?   ? ? ? ?Cognition Arousal/Alertness: Awake/alert ?Behavior During Therapy: Flat affect, Restless ?Overall Cognitive Status: Impaired/Different from baseline ?Area of Impairment: Attention, Memory, Following commands, Safety/judgement, Problem solving ?  ?  ?  ?  ?  ?  ?  ?  ?  ?  ?Memory: Decreased short-term memory ?Following Commands: Follows one step commands with increased time, Follows one step commands inconsistently ?Safety/Judgement: Decreased awareness of deficits, Decreased awareness of safety ?  ?Problem Solving: Slow processing, Decreased initiation, Difficulty sequencing, Requires verbal cues, Requires tactile cues ?General Comments: cues to correctly identify objects. Pt attempts to brush hair with comb in plastic bag ?  ?  ?   ? ?Pertinent Vitals/ Pain       Pain Assessment ?Pain Assessment: No/denies pain ? ? ?Frequency ? Min 4X/week  ? ? ? ? ?  ?Progress Toward Goals ? ?OT Goals(current goals can now be found in the care plan section) ? Progress towards OT goals: Progressing toward goals ? ?Acute Rehab OT Goals ?Patient Stated Goal: to lie down ?OT Goal Formulation: With patient/family ?Time For Goal Achievement: 03/23/22 ?Potential to Achieve Goals: Fair ?ADL Goals ?Pt Will Perform Grooming: sitting;with min assist ?Pt Will Transfer to Toilet: with mod assist;with +2 assist;bedside commode;stand pivot transfer ?Additional ADL Goal #1: Pt will engage in ADL task requiring MIN VC for sequencing with pt able to follow simple commands with increased time to complete, 5/5 opportunities.  ?Plan Discharge plan remains appropriate;Frequency remains appropriate   ? ?Co-evaluation ? ? ?   ?  ?  ?  ?  ? ?  ?AM-PAC OT "6 Clicks" Daily Activity     ?Outcome Measure ? ? Help from another person eating meals?: A Little ?Help from another person taking care of personal grooming?: A Little ?Help from another person toileting, which includes using toliet, bedpan, or urinal?: A Little ?Help from another person  bathing (including washing, rinsing, drying)?: A Lot ?Help from another person to put on and taking off regular upper body clothing?: A Little ?Help from another person to put on and taking off regular lower body clothing?: A Lot ?6 Click Score: 16 ? ?  ?End of Session   ? ?OT Visit Diagnosis: Other abnormalities of gait and mobility (R26.89);Hemiplegia and hemiparesis;Other symptoms and signs involving cognitive function ?Hemiplegia - Right/Left: Left ?Hemiplegia - dominant/non-dominant: Non-Dominant ?Hemiplegia - caused by: Cerebral infarction ?  ?Activity Tolerance Patient tolerated treatment well ?  ?Patient Left in bed (transport in room) ?  ?Nurse Communication   ?  ? ?   ? ?Time: 1101-1110 ?OT Time Calculation (min): 9 min ? ?Charges: OT General Charges ?$OT Visit: 1 Visit ?OT Treatments ?$Self Care/Home Management : 8-22 mins ? ?Dessie Coma, M.S. OTR/L  ?03/11/22, 12:31 PM  ?ascom [phone] ? ?

## 2022-03-11 NOTE — Procedures (Addendum)
Objective Swallowing Evaluation: Type of Study: FEES-Fiberoptic Endoscopic Evaluation of Swallow   Patient Details  Name: Veronica Murray MRN: [id number] Date of Birth: 29-Dec-1955  Today's Date: 03/11/2022 Time: SLP Start Time (ACUTE ONLY): 1115 -SLP Stop Time (ACUTE ONLY): 1145  SLP Time Calculation (min) (ACUTE ONLY): 30 min   Past Medical History:  Past Medical History:  Diagnosis Date   Aortic arch atherosclerosis (HCC)    Diabetes mellitus without complication (HCC)    Essential hypertension    Fibromyalgia    Tobacco abuse    Past Surgical History:  Past Surgical History:  Procedure Laterality Date   CAROTID PTA/STENT INTERVENTION Left 01/24/2022   Procedure: CAROTID PTA/STENT INTERVENTION;  Surgeon: [name], MD;  Location: [facility];  Service: Cardiovascular;  Laterality: Left;   CAROTID PTA/STENT INTERVENTION Left 02/28/2022   Procedure: CAROTID PTA/STENT INTERVENTION;  Surgeon: [name], MD;  Location: [facility] CV [facility];  Service: Cardiovascular;  Laterality: Left;   CERVICAL BIOPSY     CHOLECYSTECTOMY     ENDARTERECTOMY Left 01/25/2022   Procedure: ENDARTERECTOMY CAROTID, possible ligation;  Surgeon: [name], MD;  Location: [facility];  Service: Vascular;  Laterality: Left;   ENDARTERECTOMY Left 02/25/2022   Procedure: ENDARTERECTOMY CAROTID revision;  Surgeon: Bertram Denver, MD;  Location: [facility] OR[facility];  Service: Vascular;  Laterality: Left;   HERNIA REPAIR     LEFT HEART CATH AND CORONARY ANGIOGRAPHY Left 07/05/2019   Procedure: LEFT HEART CATH AND CORONARY ANGIOGRAPHY;  Surgeon: Iran Ouch, MD;  Location: [facility];  Service: Cardiovascular;  Laterality: Left;   HPI: Per Physician's H&P "Patient s/p LEFT CEA on 01/25/2022 at our facility secondary to TIA and high grade stenosis. The patient had been well, presented for follow up without issue. Noted on 02/20/2022 she developed sudden swelling, pain and murky drainage from the  incision. She was evaluated by urgent care on 02/22/2022 and it was felt she had a wound infection. She was treated with ABX. Last night the patient developed significant bleeding and hypotension to the 70s. Taken by EMS to [facility], digital pressure held, transfused pRBC. Neurologically intact. [facility], with active bleeding. That facility and Vascular surgeon did not accept the patient. I accepted the patient for emergent intervention. Upon arrival EMS was holding pressure at neck incision, the patient was awake, alert and mentating and aware of situation. She Understood significant risks of bleeding, stroke and death. Consent obtained.".     CXR on 03/03/2022: while still orally intubated, No acute cardiopulmonary disease identified.  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 Petechial hemorrhage  at this site. Local mass effect without significant ventricular  effacement. No midline shift.     Multiple smaller acute/early subacute cortical and subcortical  infarcts within the left frontoparietal lobes, posterior left  temporal lobe and lateral left occipital lobe (left MCA vascular  territory, as well as left MCA/ACA and left MCA/PCA watershed  territories). Petechial hemorrhage associated with some of these  infarcts. 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   Subjective: Pt alert. Flat affect. Slow, inconsistent responses. R gaze preference. Sitter (NT) accompanied pt to fluoro suite.    Recommendations  for follow up therapy are one component of a multi-disciplinary discharge planning process, led by the attending physician.  Recommendations may be updated based  on patient status, additional functional criteria and insurance authorization.  Assessment / Plan / Recommendation     03/11/2022    2:08 PM  Clinical Impressions  Clinical Impression MBSS completed. Pt alert. Flat affect. Slow, inconsistent responses. R gaze preference. Pt easily distracted by environmental stimuli and fluorscopy equipment. Sitter (NT) accompanied pt to fluoro suite.   Pt presents with a mild-moderate oropharyngeal dysphagia. Oral phase notable for oral holding and prolonged A-P transit across textures, prolonged mastication of solids, anterior loss of thin liquids on L when presented via cup and teaspoon, and trace-mild oral stasis with pudding and solid. Suspect oral deficits exacerbated by pt's distractibility/reduced sustained attention. Pharyngeal phase notable for inconsistent, transient during the swallow penetration of thin liquids via cup sip, inconsistent during the swallow transient aspiration of thin liquids via teaspoon, transient before the swallow aspiration of thin liquids via straw sip, and after the swallow penetration of pureed which remained above the vocal cords. All penetration/aspiration was in trace amount. Penetration and aspiration with thin liquids cleared with laryngeal vestibule closure. Penetration with pudding appeared to be secondary due oral residual, although this was not directly visualized under fluoroscopy. Pt sensate to penetration with pudding and it cleared minimally with spontaneous throat clear. Pharyngeal deficits secondary to: reduced/mistimed laryngeal vestibule closure and suspected passive oral residual spillage. Use of cup sip appeared to improve efficiency/timeliness of pharyngeal swallow which is likely due to self feeding.   Recommend cautious diet upgrade to Dysphagia 2 Diet (minced meats) with THIN LIQUIDS - NO STRAWS - and safe swallowing strategies/aspiration precuations as outlined below. SLP to f/u per POC for diet tolerance and  further pt/family education in less distracting environment.         03/11/2022    2:03 PM  Treatment Recommendations  Treatment Recommendations Therapy as outlined in treatment plan below        03/11/2022    2:04 PM  Prognosis  Prognosis for Safe Diet Advancement Fair  Barriers to Reach Goals Cognitive deficits;Severity of deficits       03/11/2022    2:03 PM  Diet Recommendations  SLP Diet Recommendations Dysphagia 2 (Fine chop) solids;Thin liquid  Liquid Administration via No straw  Medication Administration Crushed with puree  Compensations Minimize environmental distractions;Slow rate;Small sips/bites;Lingual sweep for clearance of pocketing;Follow solids with liquid  Postural Changes Remain semi-upright after after feeds/meals (Comment);Seated upright at 90 degrees         03/11/2022    2:03 PM  Other Recommendations  Recommended Consults --  Oral Care Recommendations Oral care BID;Staff/trained caregiver to provide oral care  Follow Up Recommendations Skilled nursing-short term rehab (<3 hours/day)  Assistance recommended at discharge Frequent or constant Supervision/Assistance  Functional Status Assessment Patient has had a recent decline in their functional status and demonstrates the ability to make significant improvements in function in a reasonable and predictable amount of time.       03/11/2022    2:03 PM  Frequency and Duration   Speech Therapy Frequency (ACUTE ONLY) min 2x/week  Treatment Duration 2 weeks         03/11/2022    1:55 PM  Oral Phase  Oral Phase Impaired  Oral - Thin Teaspoon Holding of bolus;Delayed oral transit;Left anterior bolus loss  Oral - Thin Cup Left anterior bolus loss;Holding of bolus;Delayed oral transit  Oral - Thin Straw Holding of bolus;Delayed oral transit  Oral - Puree Holding of bolus;Delayed oral transit;Lingual/palatal residue  Oral - Mech Soft NT  Oral - Regular Impaired mastication;Lingual/palatal residue  Oral -  Multi-Consistency NT  Oral - Pill NT       03/11/2022    2:00 PM  Pharyngeal Phase  Pharyngeal- Thin Teaspoon Delayed swallow initiation-pyriform sinuses;Reduced airway/laryngeal closure;Penetration/Aspiration during swallow;Trace aspiration  Pharyngeal Material does not enter airway;Material enters airway, passes BELOW cords then ejected out  Pharyngeal- Thin Cup Delayed swallow initiation-pyriform sinuses;Reduced airway/laryngeal closure;Penetration/Aspiration during swallow;Trace aspiration  Pharyngeal Material does not enter airway;Material enters airway, remains ABOVE vocal cords then ejected out  Pharyngeal- Thin Straw Delayed swallow initiation-pyriform sinuses;Reduced airway/laryngeal closure;Penetration/Aspiration before swallow;Trace aspiration  Pharyngeal- Puree Delayed swallow initiation-vallecula;Penetration/Apiration after swallow  Pharyngeal Material does not enter airway;Material enters airway, remains ABOVE vocal cords and not ejected out  Pharyngeal- Regular Delayed swallow initiation-vallecula  Pharyngeal Material does not enter airway        03/11/2022    2:07 PM  Cervical Esophageal Phase   Cervical Esophageal Phase [facility]    Clyde Canterbury, M.S., CCC-SLP Speech-Language Pathologist [facility] [phone] (ASCOM)   Woodroe Chen 03/11/2022, 2:15 PM

## 2022-03-11 NOTE — Progress Notes (Signed)
Physical Therapy Treatment ?Patient Details ?Name: Veronica Murray ?MRN: [id number] ?DOB: 1955-11-23 ?Today's Date: 03/11/2022 ? ? ?History of Present Illness 66 yo F transferred emergently to [facility] ED for emergent vascular intervention on a ruptured carotid endarterectomy wound. Intubated on 4/16, extubated on 4/24. 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 Petechial hemorrhage at this site."  Hospital course significant for L neck exploration with surture repair to L carotid (02/24/22) and placement of embolic protective device L carotid (02/28/22) ? ?  ?PT Comments  ? ? Pt seen for PT tx with pt agreeable to tx. Pt still with flat affect but reports she's doing well & is motivated. Pt is able to complete bed mobility without physical assistance & STS with close supervision<>CGA with extra time. Pt ambulates 2 laps around nurses station upon encouragement from PT as pt reports need to sit after 1 lap; after rest & later during session pt ambulates additional lap. Pt is able to ambulate without AD & min assist with pt continuing to demonstrate R gaze preference. Pt with poor sustained attention to task & is internally/externally distracted throughout. Attempted to have pt engage in cognitive task during ambulation but pt unable. Pt also unable to follow simple commands in quiet, controlled room environment to perform tandem standing for balance re-training. Pt does engage in retrograde gait x ~30 ft without AD & min assist. This PT continues to recommend CIR as pt would benefit from intensive therapy to help facilitate return to PLOF as pt continues to be limited by impaired balance, neuromuscular control, and cognition. If pt's family truly wishes to take her home they would benefit from family training prior to pt's d/c -- team notified.  ?   ?Recommendations for follow  up therapy are one component of a multi-disciplinary discharge planning process, led by the attending physician.  Recommendations may be updated based on patient status, additional functional criteria and insurance authorization. ? ?Follow Up Recommendations ? Acute inpatient rehab (3hours/day) ?  ?  ?Assistance Recommended at Discharge Frequent or constant Supervision/Assistance  ?Patient can return home with the following A lot of help with walking and/or transfers;A lot of help with bathing/dressing/bathroom;Assistance with cooking/housework;Direct supervision/assist for financial management;Assist for transportation;Direct supervision/assist for medications management;Help with stairs or ramp for entrance ?  ?Equipment Recommendations ?    ?  ?Recommendations for Other Services   ? ? ?  ?Precautions / Restrictions Precautions ?Precautions: Fall ?Restrictions ?Weight Bearing Restrictions: No  ?  ? ?Mobility ? Bed Mobility ?Overal bed mobility: Modified Independent ?Bed Mobility: Supine to Sit ?  ?  ?Supine to sit: Supervision, HOB elevated (bed rails) ?  ?  ?  ?  ? ?Transfers ?Overall transfer level: Needs assistance ?Equipment used: None ?Transfers: Sit to/from Stand ?Sit to Stand: Min guard ?  ?  ?  ?  ?  ?  ?  ? ?Ambulation/Gait ?Ambulation/Gait assistance: Min assist ?Gait Distance (Feet): 370 Feet (+ 175 ft) ?Assistive device: None ?Gait Pattern/deviations: Decreased step length - right, Decreased step length - left, Decreased weight shift to left ?Gait velocity: decreased ?  ?  ?General Gait Details: Decreased L hip flexion during swing phase. ? ? ?Stairs ?  ?  ?  ?  ?  ? ? ?Wheelchair Mobility ?  ? ?Modified Rankin (Stroke Patients Only) ?  ? ? ?  ?Balance Overall balance assessment: Needs assistance ?Sitting-balance support:  No upper extremity supported, Feet supported ?Sitting balance-Leahy Scale: Good ?  ?  ?Standing balance support: No upper extremity supported, During functional activity ?Standing  balance-Leahy Scale: Poor ?  ?  ?  ?  ?  ?  ?  ?  ?High Level Balance Comments: Attempted to engage pt in tandem stance for balance re-training but pt unable to follow commands without tactile cuing, manual facilitation & pt with very poor sustained attention to task so unable to participate. ?  ?  ?  ?  ? ?  ?Cognition Arousal/Alertness: Awake/alert ?Behavior During Therapy: Flat affect, Restless ?Overall Cognitive Status: Impaired/Different from baseline ?Area of Impairment: Attention, Memory, Following commands, Safety/judgement, Problem solving, Awareness, Orientation ?  ?  ?  ?  ?  ?  ?  ?  ?Orientation Level: Disoriented to, Time, Situation ?Current Attention Level: Sustained ?Memory: Decreased short-term memory ?Following Commands: Follows one step commands with increased time, Follows one step commands inconsistently ?Safety/Judgement: Decreased awareness of deficits, Decreased awareness of safety ?Awareness: Intellectual ?Problem Solving: Slow processing, Decreased initiation, Difficulty sequencing, Requires verbal cues, Requires tactile cues ?  ?  ?  ? ?  ?Exercises   ? ?  ?General Comments   ?  ?  ? ?Pertinent Vitals/Pain Pain Assessment ?Pain Assessment: No/denies pain  ? ? ?Home Living   ?  ?  ?  ?  ?  ?  ?  ?  ?  ?   ?  ?Prior Function    ?  ?  ?   ? ?PT Goals (current goals can now be found in the care plan section) Acute Rehab PT Goals ?Patient Stated Goal: get better ?PT Goal Formulation: With patient ?Time For Goal Achievement: 03/18/22 ?Potential to Achieve Goals: Good ?Progress towards PT goals: Progressing toward goals ? ?  ?Frequency ? ? ? 7X/week ? ? ? ?  ?PT Plan Discharge plan needs to be updated  ? ? ?Co-evaluation   ?  ?  ?  ?  ? ?  ?AM-PAC PT "6 Clicks" Mobility   ?Outcome Measure ? Help needed turning from your back to your side while in a flat bed without using bedrails?: None ?Help needed moving from lying on your back to sitting on the side of a flat bed without using bedrails?: A  Little ?Help needed moving to and from a bed to a chair (including a wheelchair)?: A Little ?Help needed standing up from a chair using your arms (e.g., wheelchair or bedside chair)?: A Little ?Help needed to walk in hospital room?: A Little ?Help needed climbing 3-5 steps with a railing? : A Lot ?6 Click Score: 18 ? ?  ?End of Session Equipment Utilized During Treatment: Gait belt ?Activity Tolerance: Patient tolerated treatment well ?Patient left: in chair;with chair alarm set;with call bell/phone within reach;with nursing/sitter in room ?Nurse Communication: Mobility status ?PT Visit Diagnosis: Muscle weakness (generalized) (M62.81);Difficulty in walking, not elsewhere classified (R26.2);Hemiplegia and hemiparesis;Unsteadiness on feet (R26.81) ?Hemiplegia - Right/Left: Left ?Hemiplegia - dominant/non-dominant: Non-dominant ?Hemiplegia - caused by: Cerebral infarction ?  ? ? ?Time: 1696-7893 ?PT Time Calculation (min) (ACUTE ONLY): 24 min ? ?Charges:  $Therapeutic Activity: 8-22 mins ?$Neuromuscular Re-education: 8-22 mins          ?          ? ?Aleda Grana, PT, DPT ?03/11/22, 3:28 PM ? ? ? ?Sandi Mariscal ?03/11/2022, 3:23 PM ? ?

## 2022-03-11 NOTE — Progress Notes (Signed)
Nutrition Follow-up ? ?DOCUMENTATION CODES:  ? ?Obesity unspecified ? ?INTERVENTION:  ? ?-TPN management per pharmacy ?-Ensure Enlive po BID, each supplement provides 350 kcal and 20 grams of protein ? ?NUTRITION DIAGNOSIS:  ? ?Inadequate oral intake related to inability to eat (pt sedated and ventilated) as evidenced by NPO status. ? ?Progressing; advanced to PO diet on 03/09/22 ? ?GOAL:  ? ?Patient will meet greater than or equal to 90% of their needs ? ?Progressing  ? ?MONITOR:  ? ?PO intake, Supplement acceptance, Diet advancement, Labs, Weight trends, Skin, I & O's, Other (Comment) (TPN) ? ?REASON FOR ASSESSMENT:  ? ?Malnutrition Screening Tool, Ventilator ?  ? ?ASSESSMENT:  ? 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 ? ?4/21- CT reveals new MCA infarcts ?4/27- s/p BSE- recommend continue NPO ?4/28- PICC placed, TPN initiated ?4/29- s/p BSE- advanced to dysphagia 1 diet with nectar thick liquids ?5/1- s/p FEES- advanced to dysphagia 2 diet with thin liquids ? ?Reviewed I/O's: + 1.6 L x 24 hours and +7.6 L siunce admission ? ?UOP: 800 ml x 24 hours  ? ?Spoke with nurse tech, who reports pt is tolerating current diet well. She consumed 100% of lunch and worked well with PT. ? ?Spoke with pt, who was sitting in recliner chair at time of visit. Pt is happy to eat and states she has "been eating up a storm". Discussed importance of good meal and supplement intake to promote healing, help prevent PEG placement and weaning of TPN. Pt is amenable to supplements.  ? ?Case discussed with pharmacists and MD; PEG placement is on hold due to pt tolerating PO diet well. Plan to wean TPN once pt can  demonstrate adequate intake. Per discussion with MD, will continue to follow pt's intake closely for 1-2 days to determine adequacy.  ? ?Pt remains on TPN at goal rate of 75 ml/hr, which provides 1811 kcals and 106 grams protein, meeting 100% of estimated kcals and protein.  ? ?Medications reviewed and include reglan.  ? ?Labs reviewed: CBGS: [id number] (inpatient orders for glycemic control are 0-20 units insulin aspart every 4 hours and 1 units insulin glargine-yfgn daily).   ? ?Diet Order:   ?Diet Order   ? ?       ?  DIET DYS 2 Room service appropriate? Yes; Fluid consistency: Thin  Diet effective now       ?  ? ?  ?  ? ?  ? ? ?EDUCATION NEEDS:  ? ?Education needs have been addressed ? ?Skin:  Skin Assessment: Skin Integrity Issues: ?Skin Integrity Issues:: Incisions ?Incisions: closed lt neck ? ?Last BM:  03/09/22 ? ?Height:  ? ?Ht Readings from Last 1 Encounters:  ?02/25/22 5\' 2"  (1.575 m)  ? ? ?Weight:  ? ?Wt Readings from Last 1 Encounters:  ?03/11/22 75.3 kg  ? ? ?Ideal Body Weight:  50 kg ? ?BMI:  Body mass index is 30.36 kg/m?. ? ?Estimated Nutritional Needs:  ? ?Kcal:  1800-2100kcal/day ? ?Protein:  90-105g/day ? ?Fluid:  1.5-1.8L/day ? ? ? ?Loistine Chance, RD, LDN, CDCES ?Registered Dietitian II ?Certified Diabetes Care and Education Specialist ?Please refer to [facility] for RD and/or RD on-call/weekend/after hours pager  ?

## 2022-03-11 NOTE — Progress Notes (Addendum)
Speech Language Pathology Treatment:    ?Patient Details ?Name: DAZAH LOSIER ?MRN: [id number] ?DOB: 06/10/1956 ?Today's Date: 03/11/2022 ?Time: [id number] ?SLP Time Calculation (min) (ACUTE ONLY): 13 min ? ?Assessment / Plan / Recommendation ?Clinical Impression ? Pt seen for f/u education following MBSS. Pt alert. Flat affect. Slow, inconsistent responses. R gaze preference. Improved attention in quiet environment. Sitter (NT) present.  ? ?Based on MBSS this date, pt presents with a mild-moderate oropharyngeal dysphagia with silent transient penetration/aspiration with thin liquids and audible after the swallow aspiration of pudding. See MBSS report for full details.  ? ?Reviewed diet recommendations, safe swallowing strategies/aspiration precautions, rationale for diet recommendations/safe swallowing strategies, and POC with pt. Pt verbalized understanding/agreement; however, suspect need for reinforcement of content by medical team. Pt eager for diet upgrade. Sitter, RN, and MD made aware of results, recommendations, and SLP POC. White board in pt's room and signs in room updated to reflect changes.  ? ?Recommend cautious diet upgrade to Dysphagia 2 Diet (minced meats) with THIN LIQUIDS - NO STRAWS - and safe swallowing strategies/aspiration precuations as outlined below. ? ?SLP to f/u per POC for diet tolerance and continued cognitive-linguistic tx. Recommend post-acute SLP services for dysphagia management and cognitive-linguistic tx. Anticipate need for frequent/constant supervision at d/c given cognitive-linguistic deficits.  ?  ?HPI HPI: Per 21 H&P "Patient s/p LEFT CEA on 01/25/2022 at our facility secondary to TIA and high grade stenosis. The patient had been well, presented for follow up without issue. Noted on 02/20/2022 she developed sudden swelling, pain and murky drainage from the incision. She was evaluated by urgent care on 02/22/2022 and it was felt she had a wound infection. She was treated  with ABX. Last night the patient developed significant bleeding and hypotension to the 70s. Taken by EMS to [facility], digital pressure held, transfused pRBC. Neurologically intact. [facility] by Forest[facility] was [facility], with active bleeding. That facility and Vascular surgeon did not accept the patient. I accepted the patient for emergent intervention. Upon arrival EMS was holding pressure at neck incision, the patient was awake, alert and mentating and aware of situation. She Understood significant risks of bleeding, stroke and death. Consent obtained.".     CXR on 03/03/2022: while still orally intubated, No acute cardiopulmonary disease identified.  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 Petechial hemorrhage  at this site. Local mass effect without significant ventricular  effacement. No midline shift.     Multiple smaller acute/early subacute cortical and subcortical  infarcts within the left frontoparietal lobes, posterior left  temporal lobe and lateral left occipital lobe (left MCA vascular  territory, as well as left MCA/ACA and left MCA/PCA watershed  territories). Petechial hemorrhage associated with some of these  infarcts. 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 ?  ?   ?SLP Plan ? Continue with current plan of care ? ?  ?  ?Recommendations for follow up therapy are one component of a multi-disciplinary discharge planning process, led by the attending physician.  Recommendations may be updated based on patient status, additional functional criteria and insurance authorization. ?  ? ?Recommendations  ?Diet recommendations: Dysphagia 2 (fine chop);Thin liquid ?Liquids provided via: No  straw;Cup ?Medication Administration: Crushed with puree ?Supervision: Patient  able to self feed;Full supervision/cueing for compensatory strategies ?Compensations: Minimize environmental distractions;Slow rate;Small sips/bites;Lingual sweep for clearance of pocketing;Follow solids with liquid ?Postural Changes and/or Swallow Maneuvers: Out of bed for meals;Seated upright 90 degrees;Upright 30-60 min after meal  ?   ?    ?   ? ? ? ? General recommendations:  (dietician consult) ?Oral Care Recommendations: Oral care BID;Oral care before and after PO;Patient independent with oral care;Staff/trained caregiver to provide oral care ?Follow Up Recommendations: Skilled nursing-short term rehab (<3 hours/day) ?Assistance recommended at discharge: Frequent or constant Supervision/Assistance ?SLP Visit Diagnosis: Dysphagia, oropharyngeal phase (R13.12) ?Plan: Continue with current plan of care ? ? ? ? ?  ?  ? ?Cherrie Gauze, M.S., CCC-SLP ?Speech-Language Pathologist ?[facility] ?[phone] ([place]) ? ?Quintella Baton ? ?03/11/2022, 2:24 PM ?

## 2022-03-12 ENCOUNTER — Encounter
Admission: EM | Disposition: A | Discharge status: Skilled Nursing Facility | Payer: Medicare HMO | Source: Home / Self Care | Attending: Obstetrics and Gynecology

## 2022-03-12 DIAGNOSIS — Z9889 Other specified postprocedural states: Secondary | ICD-10-CM | POA: Diagnosis not present

## 2022-03-12 LAB — BASIC METABOLIC PANEL
Anion gap: 10 (ref 5–15)
BUN: 21 mg/dL (ref 8–23)
CO2: 25 mmol/L (ref 22–32)
Calcium: 8.2 mg/dL — ABNORMAL LOW (ref 8.9–10.3)
Chloride: 100 mmol/L (ref 98–111)
Creatinine, Ser: 0.47 mg/dL (ref 0.44–1.00)
GFR, Estimated: >60 mL/min (ref >60)
Glucose, Bld: 168 mg/dL — ABNORMAL HIGH (ref 70–99)
Potassium: 3.5 mmol/L (ref 3.5–5.1)
Sodium: 135 mmol/L (ref 135–145)

## 2022-03-12 LAB — CBC
HCT: 28 % — ABNORMAL LOW (ref 36.0–46.0)
Hemoglobin: 9.2 g/dL — ABNORMAL LOW (ref 12.0–15.0)
MCH: 29.3 pg (ref 26.0–34.0)
MCHC: 32.9 g/dL (ref 30.0–36.0)
MCV: 89.2 fL (ref 80.0–100.0)
Platelets: 507 10*3/uL — ABNORMAL HIGH (ref 150–400)
RBC: 3.14 MIL/uL — ABNORMAL LOW (ref 3.87–5.11)
RDW: 13.3 % (ref 11.5–15.5)
WBC: 7.8 10*3/uL (ref 4.0–10.5)
nRBC: 0 % (ref 0.0–0.2)

## 2022-03-12 LAB — GLUCOSE, CAPILLARY
Glucose-Capillary: 152 mg/dL — ABNORMAL HIGH (ref 70–99)
Glucose-Capillary: 164 mg/dL — ABNORMAL HIGH (ref 70–99)
Glucose-Capillary: 181 mg/dL — ABNORMAL HIGH (ref 70–99)
Glucose-Capillary: 209 mg/dL — ABNORMAL HIGH (ref 70–99)
Glucose-Capillary: 242 mg/dL — ABNORMAL HIGH (ref 70–99)
Glucose-Capillary: 248 mg/dL — ABNORMAL HIGH (ref 70–99)
Glucose-Capillary: 324 mg/dL — ABNORMAL HIGH (ref 70–99)
Glucose-Capillary: 329 mg/dL — ABNORMAL HIGH (ref 70–99)

## 2022-03-12 SURGERY — INSERTION OF GASTROSTOMY TUBE
Anesthesia: Choice

## 2022-03-12 MED ORDER — INSULIN GLARGINE-YFGN 100 UNIT/ML ~~LOC~~ SOLN
20.0000 [IU] | Freq: Every day | SUBCUTANEOUS | Status: DC
  Administered 2022-03-12 – 2022-03-14 (×3): 20 [IU] via SUBCUTANEOUS
  Filled 2022-03-12 (×3): qty 0.2

## 2022-03-12 MED ORDER — ADULT MULTIVITAMIN W/MINERALS CH
1.0000 | ORAL_TABLET | Freq: Every day | ORAL | Status: DC
  Administered 2022-03-13 – 2022-03-15 (×3): 1 via ORAL
  Filled 2022-03-12 (×3): qty 1

## 2022-03-12 MED ORDER — TRAVASOL 10 % IV SOLN
INTRAVENOUS | Status: AC
  Filled 2022-03-12: qty 566.4

## 2022-03-12 NOTE — Progress Notes (Signed)
Occupational Therapy Treatment ?Patient Details ?Name: Veronica Murray ?MRN: [id number] ?DOB: [birth year] ?Today's Date: 03/12/2022 ? ? ?History of present illness 66 yo F transferred emergently to [facility] ED for emergent vascular intervention on a ruptured carotid endarterectomy wound. Intubated on 4/16, extubated on 4/24. 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 Petechial hemorrhage at this site."  Hospital course significant for L neck exploration with surture repair to L carotid (02/24/22) and placement of embolic protective device L carotid (02/28/22) ?  ?OT comments ? Pt received in recliner, holding phone to ear as if listening, with nurse reporting that pt had been talking with a friend on the phone. Therapist takes phone, however, and discovers that no one is on line -- phone is making beeping, disconnected noise. Engaged pt in UE therex, reaching beyond BOS in sitting. Pt able to reach in all directions with both R and L UE, although has difficultly attending to L field, requires verbal and tactile cueing to switch between reaching with R vs L hand. Pt with limited verbalizations, does not respond to questioning re: pain, place, or time. When asked what sort of building we are in, pt responds "brick." Pt able to initiate and sequence toothbrushing without cueing, but requires multimodal cueing to terminate task after several minutes. Able to write first but not last name. With clock-drawing exercise, draws only in L lower quadrant, where she makes a "2" and a "3." Pt continues to make improvements in engagement, alertness, and verbalization but requires max A and multimodal cueing for problem solving and for ADL task initiation, sequencing, and termination. Pt will benefit from intensive post-acute rehab, to improve fxl mobility, ADL/IADL task performance, safety,  and independence.   ? ?Recommendations for follow up therapy are one component of a multi-disciplinary discharge planning process, led by the attending physician.  Recommendations may be updated based on patient status, additional functional criteria and insurance authorization. ?   ?Follow Up Recommendations ? Acute inpatient rehab (3hours/day)  ?  ?Assistance Recommended at Discharge Frequent or constant Supervision/Assistance  ?Patient can return home with the following ? Assistance with cooking/housework;Assist for transportation;Direct supervision/assist for medications management;Direct supervision/assist for financial management;Help with stairs or ramp for entrance;A lot of help with walking and/or transfers;A lot of help with bathing/dressing/bathroom;Assistance with feeding ?  ?Equipment Recommendations ?    ?  ?Recommendations for Other Services   ? ?  ?Precautions / Restrictions Precautions ?Precautions: Fall ?Restrictions ?Weight Bearing Restrictions: No  ? ? ?  ? ?Mobility Bed Mobility ?  ?  ?  ?  ?  ?  ?  ?General bed mobility comments: seated in recliner beginning/end of treatment session ?  ? ?Transfers ?Overall transfer level: Needs assistance ?Equipment used: None, Rolling walker (2 wheels) ?Transfers: Sit to/from Stand ?Sit to Stand: Min assist ?  ?  ?  ?  ?  ?General transfer comment: Verbal and tactile cueing to initiate standing/sitting ?  ?  ?Balance Overall balance assessment: Needs assistance ?Sitting-balance support: No upper extremity supported, Feet supported ?Sitting balance-Leahy Scale: Good ?  ?Postural control: Right lateral lean ?Standing balance support: Bilateral upper extremity supported ?Standing balance-Leahy Scale: Poor ?  ?  ?  ?  ?  ?  ?  ?  ?  ?  ?  ?  ?   ? ?ADL either performed or assessed with clinical judgement  ? ?ADL Overall ADL's :  Needs assistance/impaired ?Eating/Feeding: Set up;Minimal assistance ?Eating/Feeding Details (indicate cue type and reason): with cueing,  pt able to lift cup, utensils and bring to mouth ?Grooming: Oral care ?Grooming Details (indicate cue type and reason): handed toothbrush, pt able to place in mouth and spontaneously rotates to various positions w/in mouth. Requires verbal and tactile cueing to terminate task ?  ?  ?  ?  ?  ?  ?  ?  ?  ?  ?  ?  ?  ?  ?  ?General ADL Comments: pt able to perform toothbrushing following set up and INDly moves brush to different areas of mouth. Requires cueing, however, to discontinue tasks. ?  ? ?Extremity/Trunk Assessment Upper Extremity Assessment ?Upper Extremity Assessment: Generalized weakness;Difficult to assess due to impaired cognition ?  ?Lower Extremity Assessment ?Lower Extremity Assessment: Generalized weakness;Difficult to assess due to impaired cognition ?  ?  ?  ? ?Vision   ?Additional Comments: Pt demonstrates L side inattention, able to visually track and scan and L side ?  ?Perception   ?  ?Praxis   ?  ? ?Cognition Arousal/Alertness: Awake/alert ?Behavior During Therapy: Flat affect ?Overall Cognitive Status: Impaired/Different from baseline ?  ?  ?  ?  ?  ?  ?  ?  ?  ?  ?  ?  ?Following Commands: Follows one step commands inconsistently, Follows one step commands with increased time ?Safety/Judgement: Decreased awareness of deficits, Decreased awareness of safety ?  ?Problem Solving: Slow processing, Decreased initiation, Difficulty sequencing, Requires verbal cues, Requires tactile cues ?General Comments: Inconsistently follows one-step directions. Frequently requires verbal, tactile cueing to initiate, continue, and terminate tasks. ?  ?  ?   ?Exercises Other Exercises ?Other Exercises: Writing exercise: pt able to write first but not last name; in clock-drawing task, pt draws only in lower left quadrant of clock face. ? ?  ?Shoulder Instructions   ? ? ?  ?General Comments    ? ? ?Pertinent Vitals/ Pain       Pain Assessment ?Pain Assessment: PAINAD ?Breathing: normal ?Negative Vocalization:  none ?Facial Expression: smiling or inexpressive ?Body Language: tense, distressed pacing, fidgeting ?Consolability: no need to console ?PAINAD Score: 1 ? ?Home Living   ?  ?  ?  ?  ?  ?  ?  ?  ?  ?  ?  ?  ?  ?  ?  ?  ?  ?  ? ?  ?Prior Functioning/Environment    ?  ?  ?  ?   ? ?Frequency ? Min 4X/week  ? ? ? ? ?  ?Progress Toward Goals ? ?OT Goals(current goals can now be found in the care plan section) ? Progress towards OT goals: Progressing toward goals ? ?Acute Rehab OT Goals ?OT Goal Formulation: With patient/family ?Time For Goal Achievement: 03/23/22 ?Potential to Achieve Goals: Good  ?Plan Discharge plan remains appropriate;Frequency remains appropriate   ? ?Co-evaluation ? ? ?   ?  ?  ?  ?  ? ?  ?AM-PAC OT "6 Clicks" Daily Activity     ?Outcome Measure ? ? Help from another person eating meals?: A Little ?Help from another person taking care of personal grooming?: A Lot ?Help from another person toileting, which includes using toliet, bedpan, or urinal?: A Lot ?Help from another person bathing (including washing, rinsing, drying)?: A Lot ?Help from another person to put on and taking off regular upper body clothing?: A Little ?Help from another person to put on  and taking off regular lower body clothing?: A Lot ?6 Click Score: 14 ? ?  ?End of Session Equipment Utilized During Treatment: Rolling walker (2 wheels) ? ?OT Visit Diagnosis: Other abnormalities of gait and mobility (R26.89);Hemiplegia and hemiparesis;Other symptoms and signs involving cognitive function ?Hemiplegia - Right/Left: Left ?Hemiplegia - caused by: Cerebral infarction ?  ?Activity Tolerance Patient tolerated treatment well ?  ?Patient Left in chair;with chair alarm set;with call bell/phone within reach ?  ?Nurse Communication   ?  ? ?   ? ?Time: [id number] ?OT Time Calculation (min): 18 min ? ?Charges: OT General Charges ?$OT Visit: 1 Visit ?OT Treatments ?$Self Care/Home Management : 8-22 mins ? ?Josiah Lobo, PhD, MS,  OTR/L ?03/12/22, 1:01 PM ? ?

## 2022-03-12 NOTE — Plan of Care (Signed)

## 2022-03-12 NOTE — Progress Notes (Addendum)
Nutrition Follow Up Note  ? ?DOCUMENTATION CODES:  ? ?Obesity unspecified ? ?INTERVENTION:  ? ?TPN per pharmacy- decreased to half rate today ? ?Ensure Enlive po BID, each supplement provides 350 kcal and 20 grams of protein. ? ?MVI po daily  ? ?48 hour calorie count  ? ?NUTRITION DIAGNOSIS:  ? ?Inadequate oral intake related to inability to eat (pt sedated and ventilated) as evidenced by NPO status. ?-pt advanced to a dysphagia 2 diet but continues to have poor oral intake ? ?GOAL:  ? ?Patient will meet greater than or equal to 90% of their needs ?-not met  ? ?MONITOR:  ? ?PO intake, Supplement acceptance, Diet advancement, Labs, Weight trends, Skin, I & O's, Other (Comment) (TPN) ? ?ASSESSMENT:  ? 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 ? ?CT scan from 4/21 reports new MCA infarcts  ? ?Met with pt in room today. Pt sitting up in chair and reports that she wants to go home. Pt's daughter is at bedside. Pt upgraded to a dysphagia 2 diet yesterday and initially consumed 100% of her breakfast but pt documented to have eaten only 20% of dinner last night and 20% of breakfast this morning. Pt eating lunch at time of RD visit and has only taken bites of some egg salad. Pt is only drinking small amounts of Ensure. RD discussed with pt and daughter the importance of adequate nutrition needed to preserve lean muscle. Pt previously scheduled for surgical G-tube placement but this was on hold pending pt's oral intake. RD will initiate at 48 hour calorie count to determine how much of her estimated needs that pt is meeting via oral intake. Pt and daughter report that they are in agreement to place G-tube if  patient needs it. Per chart, pt is down 4lbs(2%) since admission. Repeat NFPE today is similar to pt's exam on admission. Pt tolerating TPN well at goal rate. Pt is noted to have hyperglycemia. Will decrease TPN to half rate tonight and continue to re-assess nutritional needs daily. Would recommend addition of insulin into TPN is blood sugars remain elevated.  ? ?Medications reviewed and include: aspirin, plavix, lovenox, insulin, reglan, nicotine, protonix, unasyn ? ?Labs reviewed: Na 135 wnl, K 3.5 wnl ?P 3.8 wnl, Mg 1.9 wnl- 5/1 ?Hgb 9.2(L), Hct 28.0(L) ?Cbgs- 329, 324, 248, 164, 242 x 24 hrs ? ?Nutrition Focused Physical Exam: ? ?Flowsheet Row Most Recent Value  ?Orbital Region No depletion  ?Upper Arm Region No depletion  ?Thoracic and Lumbar Region No depletion  ?Buccal Region No depletion  ?Temple Region No depletion  ?Clavicle Bone Region Mild depletion  ?Clavicle and Acromion Bone Region Mild depletion  ?Scapular Bone Region No depletion  ?Dorsal Hand No depletion  ?Patellar Region Moderate depletion  ?Anterior Thigh Region Mild depletion  ?Posterior Calf Region Mild depletion  ?Edema (RD Assessment) Mild  ?Hair Reviewed  ?Eyes Reviewed  ?Mouth Reviewed  ?Skin Reviewed  ?Nails Reviewed  ? ?Diet Order:   ?Diet Order   ? ?       ?  DIET DYS 2 Room service appropriate? Yes; Fluid consistency: Thin  Diet effective now       ?  ? ?  ?  ? ?  ? ?  EDUCATION NEEDS:  ? ?Education needs have been addressed ? ?Skin:  Skin Assessment: Skin Integrity Issues: ?Skin Integrity Issues:: Incisions ?Incisions: closed lt neck ? ?Last BM:  5/2 ? ?Height:  ? ?Ht Readings from Last 1 Encounters:  ?02/25/22 _0  (1.575 m)  ? ? ?Weight:  ? ?Wt Readings from Last 1 Encounters:  ?03/11/22 75.3 kg  ? ? ?Ideal Body Weight:  50 kg ? ?BMI:  Body mass index is 30.36 kg/m?. ? ?Estimated Nutritional Needs:  ? ?Kcal:  1800-2100kcal/day ? ?Protein:  90-105g/day ? ?Fluid:  1.5-1.8L/day ? ?Koleen Distance MS, RD, LDN ?Please refer to AMION for RD  and/or RD on-call/weekend/after hours pager ? ?

## 2022-03-12 NOTE — Plan of Care (Signed)

## 2022-03-12 NOTE — Progress Notes (Signed)
PHARMACY CONSULT NOTE FOR: ? ?OUTPATIENT  PARENTERAL ANTIBIOTIC THERAPY (OPAT) ? ?Indication: S constellatus endarterectomy graft infection ?Regimen: ceftriaxone 2gm IV q24h ?End date: 04/07/2022 ? ?IV antibiotic discharge orders are pended. ?To discharging provider:  please sign these orders via discharge navigator,  ?Select New Orders & click on the button choice - Manage This Unsigned Work.  ?  ? ?Thank you for allowing pharmacy to be a part of this patient's care. ? ?Doreene Eland, PharmD, BCPS, BCIDP ?Work Cell: [phone] ?03/12/2022 12:57 PM ? ? ?

## 2022-03-12 NOTE — Consult Note (Signed)
PHARMACY - TOTAL PARENTERAL NUTRITION CONSULT NOTE  ? ?Indication:  severe disphagia, not tolerating enteral feedings ? ?Patient Measurements: ?Height: 5\' 2"  (157.5 cm) ?Weight: 75.3 kg (166 lb) ?IBW/kg (Calculated) : 50.1 ?TPN AdjBW (KG): 56.9 ?Body mass index is 30.36 kg/m?. ?Usual Weight: 82.9 kg on 03/04/22 ? ?Assessment: Pharmacy has been consulted to initiate TPN in 65yo patient admitted s/p left CEA on 01/25/22 secondary to TIA and high grade stenosis. Patient had been doing well, but started developing sudden swelling, pain and murky drainage from incision on 02/20/22. 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 Due to left carotid patch infection, patient remained intubated. On 4/20, underwent left carotid artery stent placement. Patient still having dysphagia and at high risk for aspiration. Has self-discontinued NG tube several times. IR has declined to place again d/t this.  ? ?Glucose / Insulin: BG 168 - 256 / SSI 0-20 units,  ?Last 24h: 43 units + glargine 15 units hs ? ?Electrolytes: electrolytes wnl ?Renal: SCR at baseline ?Hepatic: AST/ALT wnl ?Intake / Output; MIVF:   ?GI Imaging:  ?1. Distended urinary bladder to the umbilicus, suspicious for urinary retention. No bladder wall thickening. ?2. Small to moderate left and small right pleural effusions with associated compressive atelectasis. Mild generalized body wall edema ?in the flanks. ?3. Minimal wall thickening of the distal esophagus, can be seen with reflux or esophagitis. ? ?GI Surgeries / Procedures:  ?Plan for G-tube placement on 5/2 ? ?Central access: PICC placed 4/28 ?TPN start date: 4/28  ? ?Nutritional Goals: ?Goal TPN rate is 75 mL/hr (provides 106.2 g of protein and 1810.8 kcals per day) ? ?RD Assessment: ?Estimated Needs ?Total Energy Estimated Needs: 1800-2100kcal/day ?Total Protein Estimated Needs: 90-105g/day ?Total Fluid Estimated Needs: 1.5-1.8L/day ? ?Current Nutrition:  ?Dysphagia 1  diet ordered 4/29 ? ?Plan:  ?reduce TPN to 40 mL/hr (total volume including overfill 1060 mL) ?Electrolytes in TPN: Na 22mEq/L, K 55mEq/L, Ca 50mEq/L, Mg 55mEq/L, and Phos 80mmol/L. Cl:Ac 1:1 ?Add standard MVI and trace elements to TPN ?Nutritional components ?Amino acids (using Travasol 10%): 56.6 grams ?Dextrose: 144 grams ?Lipids (using 20% SMOFlipids): 25 grams ?kCal: 966 ?continue Resistant q4h SSI and adjust as needed  ?Continue glargine 15 units in evening  ?Monitor TPN labs on Mon/Thurs, currently ordered as daily until electrolytes stabilize. ? ?Dallie Piles ?03/12/2022,6:53 AM ? ?

## 2022-03-12 NOTE — Progress Notes (Signed)
Inpatient Rehab Admissions Coordinator:  ? ?Attempted to call pt.'s cell, room phone and daughter's home and cell number  to discuss potential CIR admit and have not received a response. I will go by and see the Pt. At [facility], but it will be later this PM. ? ?Megan Salon, MS, CCC-SLP ?Rehab Admissions Coordinator  ?[phone] (celll) ?[phone] (office) ? ?

## 2022-03-12 NOTE — Progress Notes (Signed)
Inpatient Diabetes Program Recommendations ? ?AACE/ADA: New Consensus Statement on Inpatient Glycemic Control (2015) ? ?Target Ranges:  Prepandial:   less than 140 mg/dL ?     Peak postprandial:   less than 180 mg/dL (1-2 hours) ?     Critically ill patients:  140 - 180 mg/dL  ? ?Lab Results  ?Component Value Date  ? GLUCAP 329 (H) 03/12/2022  ? HGBA1C 8.5 (H) 02/25/2022  ? ? ?Review of Glycemic Control ? Latest Reference Range & Units 03/11/22 07:24 03/11/22 11:56 03/11/22 16:28 03/11/22 20:43 03/12/22 00:09 03/12/22 04:04 03/12/22 08:15 03/12/22 09:43 03/12/22 11:32  ?Glucose-Capillary 70 - 99 mg/dL 604 (H) 540 (H) 981 (H) 240 (H) 242 (H) 164 (H) 248 (H) 324 (H) 329 (H)  ? ?Diabetes history: DM 2 ?Outpatient Diabetes medications: Lantus 20 units bid ?Current orders for Inpatient glycemic control:  ?Semglee 20 units qhs ?Novolog 0-20 units Q4 hours ? ?Ensure enlive bid between meals ?TPN- no insulin added at this time reduced rate to 40 ml/hour ? ?Inpatient Diabetes Program Recommendations:   ? ?- may consider Novolog 4 units tid meal coverage if eating consuming >50% of meals/supplements ? ?Thanks, ? ?Christena Deem RN, MSN, BC-ADM ?Inpatient Diabetes Coordinator ?Team Pager [phone] (8a-5p) ? ? ? ?

## 2022-03-12 NOTE — Progress Notes (Signed)
?PROGRESS NOTE ? ? ?HPI was taken from Dr. Lorenso Courier: ?History of Present Illness: Patient s/p LEFT CEA on 01/25/2022 at our facility secondary to TIA and high grade stenosis. The patient had been well, presented for follow up without issue. Noted on 02/20/2022 she developed sudden swelling, pain and murky drainage from the incision. She was evaluated by urgent care on 02/22/2022 and it was felt she had a wound infection. She was treated with ABX. Last night the patient developed significant bleeding and hypotension to the 70s. Taken by EMS to [facility], digital pressure held, transfused pRBC. Neurologically intact. [facility] Forest[facility] was [facility], with active bleeding. That facility and Vascular surgeon did not accept the patient. I accepted the patient for emergent intervention. Upon arrival EMS was holding pressure at neck incision, the patient was awake, alert and mentating and aware of situation. She Understood significant risks of bleeding, stroke and death. Consent obtained. ?  ?As per Dr. Mortimer Fries: ?02/25/22: Admit to ICU s/p left carotid artery repair in the setting of endarterectomy carotid rupture due to left carotid patch infection remained mechanically intubated postop ?02/28/22: Pt underwent left carotid artery stent placement  ?02/28/22: Will perform SBT with plans for possible extubation.  Pt unable to follow commands during WUA CT Head revealed subacute right MCA stroke as well as recent but older-appearing left MCA strokes x2.  Neurology consulted.  Unable to extubate  ?4/23 MRI brain: Moderate-to-large acute/early subacute right MCA territory infarct within the cortical/subcortical right frontal lobe as well as right insula and subinsular region. Petechial hemorrhage at this site. Local mass effect without significant ventricular ?4/23 NEUROLOGY CONSULTED ? ? ?Veronica Murray  [id number] DOB: 1955-12-24 DOA:  02/24/2022 ?PCP: Patient, No Pcp Per (Inactive) ? ? ?Assessment & Plan: ?  ?Principal Problem: ?  Post-operative state ?Active Problems: ?  Rupture of carotid artery ([place]) ?  CVA (cerebral vascular accident) [facility]) ?  DM2 (diabetes mellitus, type 2) ([place]) ?  Hypokalemia ?  Dysphagia following cerebral infarction ?  Normocytic anemia ?  Leukocytosis ?  Thrombocytosis ?  Hyponatremia ?  Hip pain ? ?Assessment and Plan: ? ?Acute hypoxic & hypercapnic respiratory failure: s/p intubation, ventilation & extubation. Now weaned off o2, breathing comfortably. At high risk for aspiration ? 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 Stable, vascular surgery following. Continuing aspirin and plavix. No signs re-bleeding.  ? ?Strep constellatus infection: endovascular infection, ID following, advising at least 6 wks IV abx. Advising continue unasyn for now, switch to ceftriaxone as OPAT, through 5/28 ? ?CVA: MRI brain 4/22 shows multiple small acute/early subacute cortical & subcortical infarcts w/ petechial hemorrhage associated w/ some of these infarcts. Chronic infarcts also noted. Now on aspirin and plavix and statin.  Echo shows EF 60-65%, no regional wall motion abnormalities, diastolic function is normal, & atrial septum is grossly normal. Neuro recs apprec, they have signed off. Repeat head ct on 4/28 stable cva, no hemorrhage. CIR in process of evaluation. Otherwise snf ? ?Dysphagia: remains at high risk for aspiration. Has self-discontinued NG tube several times. IR declines to place again due to this. SLP following, has now advanced diet and patient is tolerating. Continuing tpn for now, can likely decrease rate tomorrow. RD and pharmacy assisting w/ tpn mgmt. Had planned for g-tube placement but now doesn't appear we will need it. ? ?  Diarrhea? Sitter says small volume diarrhea on 4/28. Nursing collected loose stool, gi pathogen panel neg. No  fever, no leukocytosis, and no diarrhea since, thus holding off on further testing. ? ?Abdominal pain: resolved. labs and CT imaging unremarkable save for distended bladder though patient able to void spontaneously, ua not suggestive of infection, PVR wnl. Will continue to monitor. ? ?Hyponatremia: resolved ? ?Hip pain: no fall. Pain resolved. X-ray 4/26 w/o signs fracture ? ?DM2: glucose elevated with tpn, will increase semglee to 20, continue SSI ? ?Hypokalemia: monitor and replace as needed.  ? ?Normocytic anemia: H&H are stable. Will transfuse if Hb < 7.0 ? ? ? ? ? ?DVT prophylaxis: lovenox ?Code Status:  full  ?Family Communication: daughter robin updated telephonically 5/2 ?Disposition Plan: cir vs snf ? ?Level of care: Med-Surg ? ?Status is: Inpatient ?Remains inpatient appropriate because: severity of illness ? ? ? ?Consultants:  ?ICU ?Vasc surg  ?Neuro  ? ?Procedures: ? ?Antimicrobials: unasyn  ? ?Subjective: ?No complaints. No diarrhea. Voiding spontaneously. Tolerated some food today. ? ?Objective: ?Vitals:  ? 03/11/22 1604 03/11/22 2047 03/12/22 0320 03/12/22 [id number]  ?BP: (!) 160/74 (!) 157/75 121/68 117/67  ?Pulse: 93 94 91 98  ?Resp: 20 16 20    ?Temp: 99.6 ?F (37.6 ?C) 98 ?F (36.7 ?C) 98.1 ?F (36.7 ?C) 97.8 ?F (36.6 ?C)  ?TempSrc: Oral Oral Oral Oral  ?SpO2: 100% 98% 98% 99%  ?Weight:      ?Height:      ? ? ?Intake/Output Summary (Last 24 hours) at 03/12/2022 1320 ?Last data filed at 03/12/2022 0911 ?Gross per 24 hour  ?Intake 2217.62 ml  ?Output --  ?Net 2217.62 ml  ? ? ?Filed Weights  ? 03/07/22 0418 03/09/22 0416 03/11/22 0500  ?Weight: 77.8 kg 74.7 kg 75.3 kg  ? ? ?Examination: ? ?General exam: Appears calm and comfortable  ?Respiratory system: diminished breath sounds b/l ?Cardiovascular system: S1 & S2 +. No  rubs, gallops or clicks.  ?Gastrointestinal system: Abdomen is nondistended, soft and nontender. Normal bowel sounds heard. ?Central nervous system: awake, alert, slow to respond. Moving all 4  ext ?Psychiatry: calm ? ? ? ?Data Reviewed: I have personally reviewed following labs and imaging studies ? ?CBC: ?Recent Labs  ?Lab 03/07/22 ?1859 03/07/22 ?2047 03/09/22 ?0510 03/10/22 ?[id number] 03/11/22 ?[id number] 03/12/22 ?0354  ?WBC 5.6 7.5 5.4 5.8 7.3 7.8  ?NEUTROABS 3.3 4.6  --   --   --   --   ?HGB 7.0* 9.8* 9.0* 8.6* 9.4* 9.2*  ?HCT 20.6* 28.9* 26.9* 26.0* 27.5* 28.0*  ?MCV 88.0 86.3 87.9 88.7 89.0 89.2  ?PLT 437* 617* 599* 497* 508* 507*  ? ?Basic Metabolic Panel: ?Recent Labs  ?Lab 03/07/22 ?0435 03/07/22 ?2047 03/08/22 ?1311 03/09/22 ?0510 03/10/22 ?[id number] 03/11/22 ?[id number] 03/12/22 ?0354  ?NA 133*   < > 135 134* 133* 134* 135  ?K 3.0*   < > 3.4* 3.1* 3.6 4.1 3.5  ?CL 97*   < > 98 98 101 102 100  ?CO2 28   < > 27 28 28 26 25   ?GLUCOSE 207*   < > 129* 218* 218* 279* 168*  ?BUN 11   < > 11 13 13 19 21   ?CREATININE 0.36*   < > 0.44 0.36* 0.45 0.39* 0.47  ?CALCIUM 8.1*   < > 8.6* 8.2* 8.1* 8.3* 8.2*  ?MG 1.9  --  1.9 1.9 1.8 1.9  --   ?PHOS 3.1  --  3.7 3.5 3.8 3.8  --   ? < > =  values in this interval not displayed.  ? ?GFR: ?Estimated Creatinine Clearance: 68.4 mL/min (by C-G formula based on SCr of 0.47 mg/dL). ?Liver Function Tests: ?Recent Labs  ?Lab 03/07/22 ?2047 03/08/22 ?1311 03/09/22 ?0510 03/11/22 ?0458  ?AST 22 23 18 17   ?ALT 24 22 19 13   ?ALKPHOS 115 116 100 82  ?BILITOT 0.8 0.8 0.5 0.5  ?PROT 5.9* 6.2* 5.7* 5.9*  ?ALBUMIN 2.5* 2.6* 2.4* 2.5*  ? ?No results for input(s): LIPASE, AMYLASE in the last 168 hours. ?No results for input(s): AMMONIA in the last 168 hours. ?Coagulation Profile: ?Recent Labs  ?Lab 03/08/22 ?1311  ?INR 1.1  ? ?Cardiac Enzymes: ?No results for input(s): CKTOTAL, CKMB, CKMBINDEX, TROPONINI in the last 168 hours. ?BNP (last 3 results) ?No results for input(s): PROBNP in the last 8760 hours. ?HbA1C: ?No results for input(s): HGBA1C in the last 72 hours. ?CBG: ?Recent Labs  ?Lab 03/12/22 ?0009 03/12/22 ?0404 03/12/22 ?[id number] 03/12/22 ?[id number] 03/12/22 ?1132  ?GLUCAP 242* 164* 248* 324* 329*   ? ?Lipid Profile: ?Recent Labs  ?  03/11/22 ?0458  ?TRIG 101  ? ?Thyroid Function Tests: ?No results for input(s): TSH, T4TOTAL, FREET4, T3FREE, THYROIDAB in the last 72 hours. ?Anemia Panel: ?No results for

## 2022-03-12 NOTE — Progress Notes (Signed)
Physical Therapy Treatment ?Patient Details ?Name: Veronica Murray ?MRN: [id number] ?DOB: 05-Oct-1956 ?Today's Date: 03/12/2022 ? ? ?History of Present Illness 66 yo F transferred emergently to [facility] ED for emergent vascular intervention on a ruptured carotid endarterectomy wound. Intubated on 4/16, extubated on 4/24. 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 Petechial hemorrhage at this site."  Hospital course significant for L neck exploration with surture repair to L carotid (02/24/22) and placement of embolic protective device L carotid (02/28/22) ? ?  ?PT Comments  ? ? Patient with progressive improvement in alertness and overall participation with session; remains globally confused with limited insight into deficits and need for assist.  Poor ability to initiate, problem-solve and sequence activities; often requiring max, step-by-step cuing from therapist to sequence activities.   ?Completes transfers and gait with min assist initially; progressing to min/mod assist as fatigue and distraction increases.  Frequent lateral LOB with head turns and obstacle negotiation, requiring physical assist from therapist for balance correction.  Poor attention/awareness of L visual field despite cuing from therapist. ?Unsafe to attempt mobilization without physical assist +1 at all times.  Continues to demonstrate excellent potential for further functional improvement (and decreased caregiver burden) with access to optimal level of post-acute rehab services. ?   ?Recommendations for follow up therapy are one component of a multi-disciplinary discharge planning process, led by the attending physician.  Recommendations may be updated based on patient status, additional functional criteria and insurance authorization. ? ?Follow Up Recommendations ? Acute inpatient rehab (3hours/day) ?   ?  ?Assistance Recommended at Discharge Frequent or constant Supervision/Assistance  ?Patient can return home with the following A lot of help with walking and/or transfers;A lot of help with bathing/dressing/bathroom;Assistance with cooking/housework;Direct supervision/assist for financial management;Assist for transportation;Direct supervision/assist for medications management;Help with stairs or ramp for entrance ?  ?Equipment Recommendations ?    ?  ?Recommendations for Other Services   ? ? ?  ?Precautions / Restrictions Precautions ?Precautions: Fall ?Restrictions ?Weight Bearing Restrictions: No  ?  ? ?Mobility ? Bed Mobility ?  ?  ?  ?  ?  ?  ?  ?General bed mobility comments: seated in recliner beginning/end of treatment session ?  ? ?Transfers ?Overall transfer level: Needs assistance ?Equipment used: None ?Transfers: Sit to/from Stand ?Sit to Stand: Min assist ?  ?  ?  ?  ?  ?General transfer comment: min cuing for UE support to assist with lift off ?  ? ?Ambulation/Gait ?Ambulation/Gait assistance: Min assist ?Gait Distance (Feet):  (>100) ?Assistive device: None ?  ?  ?  ?  ?General Gait Details: broad BOS, inconsistent step height/length; excessive sway to R LE.  Moderate deviations with dynamic gait components (head turns, divided attention), min/mod assist to recover and maintain balance ? ? ?Stairs ?  ?  ?  ?  ?  ? ? ?Wheelchair Mobility ?  ? ?Modified Rankin (Stroke Patients Only) ?  ? ? ?  ?Balance Overall balance assessment: Needs assistance ?Sitting-balance support: No upper extremity supported, Feet supported ?Sitting balance-Leahy Scale: Good ?  ?  ?Standing balance support: No upper extremity supported ?Standing balance-Leahy Scale: Poor ?  ?  ?  ?  ?  ?  ?  ?  ?  ?  ?  ?  ?  ? ?  ?Cognition Arousal/Alertness: Awake/alert ?Behavior During Therapy: Flat affect ?Overall Cognitive Status: Impaired/Different from baseline ?  ?  ?  ?  ?  ?  ?  ?  ?  ?  ?  ?  ?  ?  ?  ?  ?  General Comments: Follows  simple commands with frequent redirection to task; often requiring step by step cuing to process and sequence tasks; significant L inattention persists ?  ?  ? ?  ?Exercises Other Exercises ?Other Exercises: Found soiled in stool upon arrival to room; patient unaware and unable to initiate/sequence requesting assist for hygiene.  Assisted with ADL to clean up--max assist for clothing management, max/dep assist for hygiene, mod/max assist for awareness and integration of items in L visual field.  Easily distractible by external environment; frequent redirection to task at hand for completion. ? ?  ?General Comments   ?  ?  ? ?Pertinent Vitals/Pain Pain Assessment ?Pain Assessment: No/denies pain  ? ? ?Home Living   ?  ?  ?  ?  ?  ?  ?  ?  ?  ?   ?  ?Prior Function    ?  ?  ?   ? ?PT Goals (current goals can now be found in the care plan section) Acute Rehab PT Goals ?Patient Stated Goal: get better ?PT Goal Formulation: With patient ?Time For Goal Achievement: 03/18/22 ?Potential to Achieve Goals: Good ?Progress towards PT goals: Progressing toward goals ? ?  ?Frequency ? ? ? 7X/week ? ? ? ?  ?PT Plan Current plan remains appropriate  ? ? ?Co-evaluation   ?  ?  ?  ?  ? ?  ?AM-PAC PT "6 Clicks" Mobility   ?Outcome Measure ? Help needed turning from your back to your side while in a flat bed without using bedrails?: None ?Help needed moving from lying on your back to sitting on the side of a flat bed without using bedrails?: A Little ?Help needed moving to and from a bed to a chair (including a wheelchair)?: A Little ?Help needed standing up from a chair using your arms (e.g., wheelchair or bedside chair)?: A Little ?Help needed to walk in hospital room?: A Lot ?Help needed climbing 3-5 steps with a railing? : A Lot ?6 Click Score: 17 ? ?  ?End of Session Equipment Utilized During Treatment: Gait belt ?Activity Tolerance: Patient tolerated treatment well ?Patient left: in chair;with chair alarm set;with call bell/phone  within reach;with nursing/sitter in room ?Nurse Communication: Mobility status ?PT Visit Diagnosis: Muscle weakness (generalized) (M62.81);Difficulty in walking, not elsewhere classified (R26.2);Hemiplegia and hemiparesis;Unsteadiness on feet (R26.81) ?Hemiplegia - Right/Left: Left ?Hemiplegia - dominant/non-dominant: Non-dominant ?Hemiplegia - caused by: Cerebral infarction ?  ? ? ?Time: 1962-2297 ?PT Time Calculation (min) (ACUTE ONLY): 32 min ? ?Charges:  $Gait Training: 8-22 mins ?$Therapeutic Activity: 8-22 mins          ?          ? ?Melah Ebling H. Manson Passey, PT, DPT, NCS ?03/12/22, 11:46 AM ?[phone] ? ? ?

## 2022-03-12 NOTE — Progress Notes (Signed)
Inpatient Rehab Admissions Coordinator:  ? ?I spoke with pt.'s daughter Melina Schools over the phone to discuss CIR and whether or not family can provide 24/7 support. She states that pt. Is separated from her husband and that if she were to return home with him, she feels the environment would be unsafe and that Pt.'s husband is physically unable to assist Pt.Melina Schools herself works full time and cannot provide 24/7 support. She states that she would prefer SNF for short term rehab as she would like to have a longer period to sort out long term options for pt. CIR will not pursue admission for this Pt.  ? ?Megan Salon, MS, CCC-SLP ?Rehab Admissions Coordinator  ?[phone] (celll) ?[phone] (office) ? ?

## 2022-03-12 NOTE — Progress Notes (Signed)
Mobility Specialist - Progress Note ? ? 03/12/22 1542  ?Mobility  ?Activity Ambulated with assistance to bathroom;Ambulated with assistance in room  ?Level of Assistance Standby assist, set-up cues, supervision of patient - no hands on  ?Assistive Device Front wheel walker  ?Distance Ambulated (ft) 25 ft  ?Activity Response Tolerated well  ?$Mobility charge 1 Mobility  ? ? ? ?Pt sitting in recliner upon arrival, utilizing RA. Noted bowel on linen, with CGA to stand pt was assisted to bathroom for urinal out and peri-hygiene. New linen provided. Pt ambulated back to chair with alarm set, needs in reach.  ? ? ?Veronica Murray ?Mobility Specialist ?03/12/22, 4:00 PM ? ? ? ?

## 2022-03-13 DIAGNOSIS — Z9889 Other specified postprocedural states: Secondary | ICD-10-CM | POA: Diagnosis not present

## 2022-03-13 LAB — BASIC METABOLIC PANEL
Anion gap: 8 (ref 5–15)
BUN: 17 mg/dL (ref 8–23)
CO2: 27 mmol/L (ref 22–32)
Calcium: 9.1 mg/dL (ref 8.9–10.3)
Chloride: 101 mmol/L (ref 98–111)
Creatinine, Ser: 0.45 mg/dL (ref 0.44–1.00)
GFR, Estimated: >60 mL/min (ref >60)
Glucose, Bld: 165 mg/dL — ABNORMAL HIGH (ref 70–99)
Potassium: 3.7 mmol/L (ref 3.5–5.1)
Sodium: 136 mmol/L (ref 135–145)

## 2022-03-13 LAB — GLUCOSE, CAPILLARY
Glucose-Capillary: 167 mg/dL — ABNORMAL HIGH (ref 70–99)
Glucose-Capillary: 167 mg/dL — ABNORMAL HIGH (ref 70–99)
Glucose-Capillary: 258 mg/dL — ABNORMAL HIGH (ref 70–99)
Glucose-Capillary: 237 mg/dL — ABNORMAL HIGH (ref 70–99)
Glucose-Capillary: 282 mg/dL — ABNORMAL HIGH (ref 70–99)

## 2022-03-13 LAB — MAGNESIUM: Magnesium: 1.9 mg/dL (ref 1.7–2.4)

## 2022-03-13 MED ORDER — TRAVASOL 10 % IV SOLN
INTRAVENOUS | Status: AC
  Filled 2022-03-13: qty 566.4

## 2022-03-13 NOTE — Consult Note (Signed)
PHARMACY - TOTAL PARENTERAL NUTRITION CONSULT NOTE  ? ?Indication:  severe disphagia, not tolerating enteral feedings ? ?Patient Measurements: ?Height: 5\' 2"  (157.5 cm) ?Weight: 75.3 kg (166 lb) ?IBW/kg (Calculated) : 50.1 ?TPN AdjBW (KG): 56.9 ?Body mass index is 30.36 kg/m?. ?Usual Weight: 82.9 kg on 03/04/22 ? ?Assessment: Pharmacy has been consulted to initiate TPN in 66yo patient admitted s/p left CEA on 01/25/22 secondary to TIA and high grade stenosis. Patient had been doing well, but started developing sudden swelling, pain and murky drainage from incision on 02/20/22. 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 Due to left carotid patch infection, patient remained intubated. On 4/20, underwent left carotid artery stent placement. Patient still having dysphagia and at high risk for aspiration. Has self-discontinued NG tube several times. IR has declined to place again d/t this.  ? ?Glucose / Insulin: BG 152 - 329 / SSI 0-20 units,  ?Last 24h: 49 units + glargine 20 units hs ? ?Electrolytes: electrolytes wnl ?Renal: SCR at baseline ?Hepatic: AST/ALT wnl ?Intake / Output; MIVF:   ?GI Imaging:  ?1. Distended urinary bladder to the umbilicus, suspicious for urinary retention. No bladder wall thickening. ?2. Small to moderate left and small right pleural effusions with associated compressive atelectasis. Mild generalized body wall edema ?in the flanks. ?3. Minimal wall thickening of the distal esophagus, can be seen with reflux or esophagitis. ? ?GI Surgeries / Procedures:  ?Plan for G-tube placement on 5/2 ? ?Central access: PICC placed 4/28 ?TPN start date: 4/28  ? ?Nutritional Goals: ?Goal TPN rate is 75 mL/hr (provides 106.2 g of protein and 1810.8 kcals per day) ? ?RD Assessment: ?Estimated Needs ?Total Energy Estimated Needs: 1800-2100kcal/day ?Total Protein Estimated Needs: 90-105g/day ?Total Fluid Estimated Needs: 1.5-1.8L/day ? ?Current Nutrition:  ?Dysphagia 1  diet ordered 4/29 ? ?Plan:  ?Continue reduced rate TPN at 40 mL/hr (total volume including overfill 1060 mL) ?Electrolytes in TPN: Na 76mEq/L, K 67mEq/L, Ca 12mEq/L, Mg 51mEq/L, and Phos 28mmol/L. Cl:Ac 1:1 ?Add standard MVI and trace elements to TPN ?Nutritional components ?Amino acids (using Travasol 10%): 56.6 grams ?Dextrose: 144 grams ?Lipids (using 20% SMOFlipids): 25 grams ?kCal: 966 ?continue Resistant q4h SSI and adjust as needed  ?Continue glargine 20 units in evening  ?Monitor TPN labs on Mon/Thurs, currently ordered as daily until electrolytes stabilize. ? ?Dallie Piles ?03/13/2022,6:51 AM ? ?

## 2022-03-13 NOTE — Progress Notes (Addendum)
Speech Language Pathology Treatment: Dysphagia  ?Patient Details ?Name: Veronica Murray ?MRN: [id number] ?DOB: [birth year] ?Today's Date: 03/13/2022 ?Time: [id number] ?SLP Time Calculation (min) (ACUTE ONLY): 40 min ? ?Assessment / Plan / Recommendation ?Clinical Impression ? Pt seen for diet upgrade this afternoon. Pt has been tolerating her current dys level 2 (minced foods) diet w/out s/s of aspiration; min slow oral phase management ongoing and ease to be distracted by her environment. Pt is receiving TPN per chart notes w/ Dietician performing a calorie count d/t pt's reduced oral intake overall.  ?Pt appeared to adequately manage increased textured trials orally w/ Mild+ oral phase deficits c/b Left buccal and lingual residue. Pt was able to clear this residue using finger and lingual sweeps; f/u swallow. Min verbal and visual cues given to pt for follow through. Dtr present and agreed w/ education and instruction w/ the diet upgrade. ?  ?In order to support oral intake safely, recommend upgrade to dys. Level 3 (mech soft) w/ moistened foods; thin liquids. Recommend supervision w/ po's for cues and follow through w/ swallowing strategies and aspiration precautions. Family wanted to bring in foods of pt's liking; this was encouraged. NSG updated. ST services will f/u next 1-2 days for monitoring of status and education. Will also f/u w/ ongoing cog-linguistic tx.  ? ? ?   ?HPI HPI: Per 42 H&P "Patient s/p LEFT CEA on 01/25/2022 at our facility secondary to TIA and high grade stenosis. The patient had been well, presented for follow up without issue. Noted on 02/20/2022 she developed sudden swelling, pain and murky drainage from the incision. She was evaluated by urgent care on 02/22/2022 and it was felt she had a wound infection. She was treated with ABX. Last night the patient developed significant bleeding and hypotension to the 70s. Taken by EMS to [facility], digital pressure held, transfused  pRBC. Neurologically intact. [facility] Forest[facility], with active bleeding. That facility and Vascular surgeon did not accept the patient. I accepted the patient for emergent intervention. Upon arrival EMS was holding pressure at neck incision, the patient was awake, alert and mentating and aware of situation. She Understood significant risks of bleeding, stroke and death. Consent obtained.".     CXR on 03/03/2022: while still orally intubated, No acute cardiopulmonary disease identified.  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 Petechial hemorrhage  at this site. Local mass effect without significant ventricular  effacement. No midline shift.     Multiple smaller acute/early subacute cortical and subcortical  infarcts within the left frontoparietal lobes, posterior left  temporal lobe and lateral left occipital lobe (left MCA vascular  territory, as well as left MCA/ACA and left MCA/PCA watershed  territories). Petechial hemorrhage associated with some of these  infarcts. 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 ?  ?   ?SLP Plan ? Continue with current plan of care ? ?  ?  ?Recommendations for follow up therapy are one component of a multi-disciplinary discharge planning process, led by the attending physician.  Recommendations may be updated based on patient status, additional functional criteria and insurance authorization. ?  ? ?Recommendations  ?Diet recommendations: Dysphagia 3 (mechanical soft);Thin liquid (gravies to moisten) ?Liquids provided via: Cup;No straw ?  Medication Administration: Whole meds with puree (vs Crushed as needed per NSG) ?Supervision: Patient able to self feed;Staff to  assist with self feeding;Intermittent supervision to cue for compensatory strategies ?Compensations: Minimize environmental distractions;Slow rate;Small sips/bites;Lingual sweep for clearance of pocketing;Follow solids with liquid (Finger sweep in left buccal area, tongue) ?Postural Changes and/or Swallow Maneuvers: Out of bed for meals;Seated upright 90 degrees;Upright 30-60 min after meal  ?   ?    ?   ? ? ? ? General recommendations:  (Dietician f/u) ?Oral Care Recommendations: Oral care BID;Oral care before and after PO;Patient independent with oral care (support) ?Follow Up Recommendations: Skilled nursing-short term rehab (<3 hours/day) ?Assistance recommended at discharge: Intermittent Supervision/Assistance ?SLP Visit Diagnosis: Dysphagia, oral phase (R13.11);Cognitive communication deficit (R41.841) (R MCA and frontal lobe strokes) ?Plan: Continue with current plan of care ? ? ? ? ?  ?  ? ? ? ? ?Orinda Kenner, MS, CCC-SLP ?Speech Language Pathologist ?Rehab Services; [place] ?[phone] (ascom) ?Veronica Murray ? ?03/13/2022, 7:04 PM ?

## 2022-03-13 NOTE — Care Management Important Message (Signed)
Important Message ? ?Patient Details  ?Name: Veronica Murray ?MRN: [id number] ?Date of Birth: 10/27/1956 ? ? ?Medicare Important Message Given:  N/A - LOS <3 / Initial given by admissions ? ? ? ? ?Olegario Messier A Connelly [place] ?03/13/2022, 2:35 PM ?

## 2022-03-13 NOTE — Progress Notes (Signed)
?PROGRESS NOTE ? ? ?HPI was taken from Dr. Lorenso Courier: ?History of Present Illness: Patient s/p LEFT CEA on 01/25/2022 at our facility secondary to TIA and high grade stenosis. The patient had been well, presented for follow up without issue. Noted on 02/20/2022 she developed sudden swelling, pain and murky drainage from the incision. She was evaluated by urgent care on 02/22/2022 and it was felt she had a wound infection. She was treated with ABX. Last night the patient developed significant bleeding and hypotension to the 70s. Taken by EMS to [facility], digital pressure held, transfused pRBC. Neurologically intact. Initial [facility] by Forestine Na [facility], with active bleeding. That facility and Vascular surgeon did not accept the patient. I accepted the patient for emergent intervention. Upon arrival EMS was holding pressure at neck incision, the patient was awake, alert and mentating and aware of situation. She Understood significant risks of bleeding, stroke and death. Consent obtained. ?  ?As per Dr. Mortimer Fries: ?02/25/22: Admit to ICU s/p left carotid artery repair in the setting of endarterectomy carotid rupture due to left carotid patch infection remained mechanically intubated postop ?02/28/22: Pt underwent left carotid artery stent placement  ?02/28/22: Will perform SBT with plans for possible extubation.  Pt unable to follow commands during [facility] CT Head revealed subacute right MCA stroke as well as recent but older-appearing left MCA strokes x2.  Neurology consulted.  Unable to extubate  ?4/23 MRI brain: Moderate-to-large acute/early subacute right MCA territory infarct within the cortical/subcortical right frontal lobe as well as right insula and subinsular region. Petechial hemorrhage at this site. Local mass effect without significant ventricular ?4/23 NEUROLOGY CONSULTED ? ?5/3: Resumed care today.  Patient with no new  complaints.  P.o. intake remained poor, only consuming 30% of the required nutrition.  Dietitian is requesting TPN for another day with a slower rate and can consider G-tube placement.  Patient and daughter to decide tomorrow, if he agrees then we will order G-tube placement. ?No family support after the CIR as recommended by PT initially, will be going to SNF for rehab. ? ?Assessment & Plan: ?  ?Principal Problem: ?  Post-operative state ?Active Problems: ?  Rupture of carotid artery ([place]) ?  CVA (cerebral vascular accident) [facility]) ?  DM2 (diabetes mellitus, type 2) ([place]) ?  Hypokalemia ?  Dysphagia following cerebral infarction ?  Normocytic anemia ?  Leukocytosis ?  Thrombocytosis ?  Hyponatremia ?  Hip pain ? ?Assessment and Plan: ? ?Acute hypoxic & hypercapnic respiratory failure: s/p intubation, ventilation & extubation. Now weaned off o2, breathing comfortably. At high risk for aspiration ? 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 Stable, vascular surgery following. Continuing aspirin and plavix. No signs re-bleeding.  ? ?Strep constellatus infection: endovascular infection, ID following, advising at least 6 wks IV abx. Advising continue unasyn for now, switch to ceftriaxone as OPAT, through 5/28 ? ?CVA: MRI brain 4/22 shows multiple small acute/early subacute cortical & subcortical infarcts w/ petechial hemorrhage associated w/ some of these infarcts. Chronic infarcts also noted. Now on aspirin and plavix and statin.  Echo shows EF 60-65%, no regional wall motion abnormalities, diastolic function is normal, & atrial septum is grossly normal. Neuro recs apprec, they have signed off. Repeat head ct on 4/28 stable cva, no hemorrhage.  No family support after CIR, daughter is interested in going to  SNF for rehab. ? ?Dysphagia: remains at high risk for aspiration. Has self-discontinued NG tube several times. IR declines to place again  due to this. SLP following, has now advanced diet and patient is tolerating. Continuing tpn for now, with a slow rate.  Dietitian consult of only consuming 30% of nutritional requirement, might need G-tube placement. ? ?Diarrhea? Sitter says small volume diarrhea on 4/28. Nursing collected loose stool, gi pathogen panel neg. No fever, no leukocytosis, and no diarrhea since, thus holding off on further testing. ? ?Abdominal pain: resolved. labs and CT imaging unremarkable save for distended bladder though patient able to void spontaneously, ua not suggestive of infection, PVR wnl. Will continue to monitor. ? ?Hyponatremia: resolved ? ?Hip pain: no fall. Pain resolved. X-ray 4/26 w/o signs fracture ? ?DM2: glucose elevated with tpn, will increase semglee to 20, continue SSI ? ?Hypokalemia: monitor and replace as needed.  ? ?Normocytic anemia: H&H are stable. Will transfuse if Hb < 7.0 ? ? ?DVT prophylaxis: lovenox ?Code Status:  full  ?Family Communication:  ?Disposition Plan: SNF ? ?Level of care: Med-Surg ? ?Status is: Inpatient ?Remains inpatient appropriate because: severity of illness ? ? ?Consultants:  ?ICU ?Vasc surg  ?Neuro  ? ?Procedures: ? ?Antimicrobials: unasyn  ? ?Subjective: ?No complaints. No diarrhea. Voiding spontaneously. Tolerated some food today. ? ?Objective: ?Vitals:  ? 03/12/22 2006 03/13/22 0401 03/13/22 0741 03/13/22 1548  ?BP: (!) 151/79 132/61 123/71 108/77  ?Pulse: 97 95 98 96  ?Resp: 16 16 18 18   ?Temp: 98.6 ?F (37 ?C) 97.9 ?F (36.6 ?C) (!) 97.3 ?F (36.3 ?C) 97.7 ?F (36.5 ?C)  ?TempSrc:   Oral Oral  ?SpO2: 100% 99% 99% 100%  ?Weight:      ?Height:      ? ? ?Intake/Output Summary (Last 24 hours) at 03/13/2022 1805 ?Last data filed at 03/13/2022 1012 ?Gross per 24 hour  ?Intake 1064.11 ml  ?Output --  ?Net 1064.11 ml  ? ? ? ?Filed Weights  ? 03/07/22 0418 03/09/22 0416 03/11/22 0500  ?Weight: 77.8 kg 74.7 kg 75.3 kg  ? ? ?Examination: ? ?General.  Ill-appearing lady, in no acute  distress. ?Pulmonary.  Lungs clear bilaterally, normal respiratory effort. ?CV.  Regular rate and rhythm, no JVD, rub or murmur. ?Abdomen.  Soft, nontender, nondistended, BS positive. ?CNS.  Alert and oriented .  No focal neurologic deficit. ?Extremities.  No edema, no cyanosis, pulses intact and symmetrical. ?Psychiatry.  Judgment and insight appears normal.  ? ? ?Data Reviewed: I have personally reviewed following labs and imaging studies ? ?CBC: ?Recent Labs  ?Lab 03/07/22 ?1859 03/07/22 ?2047 03/09/22 ?0510 03/10/22 ?[id number] 03/11/22 ?[id number] 03/12/22 ?0354  ?WBC 5.6 7.5 5.4 5.8 7.3 7.8  ?NEUTROABS 3.3 4.6  --   --   --   --   ?HGB 7.0* 9.8* 9.0* 8.6* 9.4* 9.2*  ?HCT 20.6* 28.9* 26.9* 26.0* 27.5* 28.0*  ?MCV 88.0 86.3 87.9 88.7 89.0 89.2  ?PLT 437* 617* 599* 497* 508* 507*  ? ? ?Basic Metabolic Panel: ?Recent Labs  ?Lab 03/07/22 ?0435 03/07/22 ?2047 03/08/22 ?1311 03/09/22 ?[id number] 03/10/22 ?[id number] 03/11/22 ?[id number] 03/12/22 ?0354 03/13/22 ?0409  ?NA 133*   < > 135 134* 133* 134* 135 136  ?K 3.0*   < > 3.4* 3.1* 3.6 4.1 3.5 3.7  ?CL 97*   < > 98 98 101 102 100 101  ?CO2 28   < > 27 28 28 26 25 27   ?GLUCOSE 207*   < > 129* 218* 218*  279* 168* 165*  ?BUN 11   < > 11 13 13 19 21 17   ?CREATININE 0.36*   < > 0.44 0.36* 0.45 0.39* 0.47 0.45  ?CALCIUM 8.1*   < > 8.6* 8.2* 8.1* 8.3* 8.2* 9.1  ?MG 1.9  --  1.9 1.9 1.8 1.9  --  1.9  ?PHOS 3.1  --  3.7 3.5 3.8 3.8  --   --   ? < > = values in this interval not displayed.  ? ? ?GFR: ?Estimated Creatinine Clearance: 68.4 mL/min (by C-G formula based on SCr of 0.45 mg/dL). ?Liver Function Tests: ?Recent Labs  ?Lab 03/07/22 ?2047 03/08/22 ?1311 03/09/22 ?0510 03/11/22 ?0458  ?AST 22 23 18 17   ?ALT 24 22 19 13   ?ALKPHOS 115 116 100 82  ?BILITOT 0.8 0.8 0.5 0.5  ?PROT 5.9* 6.2* 5.7* 5.9*  ?ALBUMIN 2.5* 2.6* 2.4* 2.5*  ? ? ?No results for input(s): LIPASE, AMYLASE in the last 168 hours. ?No results for input(s): AMMONIA in the last 168 hours. ?Coagulation Profile: ?Recent Labs  ?Lab  03/08/22 ?1311  ?INR 1.1  ? ? ?Cardiac Enzymes: ?No results for input(s): CKTOTAL, CKMB, CKMBINDEX, TROPONINI in the last 168 hours. ?BNP (last 3 results) ?No results for input(s): PROBNP in the last 8760 hours. ?HbA1C: ?No results for

## 2022-03-13 NOTE — TOC Progression Note (Addendum)
Transition of Care (TOC) - Progression Note  ? ? ?Patient Details  ?Name: ZOELLE MARKUS ?MRN: [id number] ?Date of Birth: 04-Feb-1956 ? ?Transition of Care (TOC) CM/SW Contact  ?Margarito Liner, LCSW ?Phone Number: ?03/13/2022, 9:39 AM ? ?Clinical Narrative:   CSW continues to follow patient's progress. Holding off on giving bed offers until nutrition plan determined as this may alter her offers. ? ?11:36 am: Per MD, plan to wean TPN off tomorrow. Updated SNF referral and sent back out. ? ?1:13 pm: [facility] can still offer a bed. [facility] is checking. Left message for admissions coordinator at [facility]. ? ?3:00 pm: Responses still pending from [facility] and [facility]. ? ?3:39 pm: Reviewed bed offers with patient and daughter. Daughter asked about [facility] facilities. Sent out referral. ? ?Expected Discharge Plan: Skilled Nursing Facility ?Barriers to Discharge: Continued Medical Work up ? ?Expected Discharge Plan and Services ?Expected Discharge Plan: Skilled Nursing Facility ?  ?Discharge Planning Services: CM Consult ?Post Acute Care Choice: Skilled Nursing Facility ?Living arrangements for the past 2 months: Hotel/Motel ?                ?DME Arranged: N/A ?DME Agency: NA ?  ?  ?  ?  ?  ?  ?  ?  ? ? ?Social Determinants of Health (SDOH) Interventions ?  ? ?Readmission Risk Interventions ?   ? View : No data to display.  ?  ?  ?  ? ? ?

## 2022-03-13 NOTE — [place] FL2 (Signed)
?[place] MEDICAID FL2 LEVEL OF CARE SCREENING TOOL  ?  ? ?IDENTIFICATION  ?Patient Name: ?Veronica Murray Birthdate: 06/14/1956 Sex: female Admission Date (Current Location): ?02/24/2022  ?[place] Number: ? [place] ?  Facility and Address:  ?[facility], [address], [place], [place] [address] ?     Provider Number: ?[id number]  ?Attending Physician Name and Address:  ?Arnetha Courser, MD ? Relative Name and Phone Number:  ?  ?   ?Current Level of Care: ?Hospital Recommended Level of Care: ?Skilled Nursing Facility Prior Approval Number: ?  ? ?Date Approved/Denied: ?  PASRR Number: ?[id number] E. Expires 5/26 ? ?Discharge Plan: ?SNF ?  ? ?Current Diagnoses: ?Patient Active Problem List  ? Diagnosis Date Noted  ? Hip pain 03/06/2022  ? CVA (cerebral vascular accident) (HCC) 03/05/2022  ? DM2 (diabetes mellitus, type 2) (HCC) 03/05/2022  ? Hypokalemia 03/05/2022  ? 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  ? Normocytic anemia 03/05/2022  ? Leukocytosis 03/05/2022  ? Thrombocytosis 03/05/2022  ? Hyponatremia 03/05/2022  ? Post-operative state 02/25/2022  ? Rupture of carotid artery (HCC)   ? Adjustment disorder with mixed anxiety and depressed mood 01/25/2022  ? Carotid stenosis, left 01/24/2022  ? Carotid stenosis 01/18/2022  ? CAD (coronary artery disease) 01/18/2022  ? Essential hypertension 01/18/2022  ? Abnormal stress test   ? Troponin I above reference range 06/25/2019  ? ? ?Orientation RESPIRATION BLADDER Height & Weight   ?  ?Self, Time, Situation ? Normal Incontinent, External catheter Weight: 166 lb (75.3 kg) ?Height:  5\' 2"  (157.5 cm)  ?BEHAVIORAL SYMPTOMS/MOOD NEUROLOGICAL BOWEL NUTRITION STATUS  ? (None)  (CVA) Continent Diet (DYS 2. NO STRAWS! Minced meats. Will wean TPN off tomorrow (5/4).)  ?AMBULATORY STATUS COMMUNICATION OF NEEDS Skin   ?Limited Assist Verbally Skin abrasions, Bruising, Other (Comment), Surgical wounds (Erythema/redness. Incision  left neck: No dressing.) ?  ?  ?  ?    ?     ?     ? ? ?Personal Care Assistance Level of Assistance  ?Bathing, Feeding, Dressing Bathing Assistance: Limited assistance ?Feeding assistance: Limited assistance ?Dressing Assistance: Limited assistance ?   ? ?Functional Limitations Info  ?Sight, Hearing, Speech Sight Info: Adequate ?Hearing Info: Adequate ?Speech Info: Adequate (Delayed responses)  ? ? ?SPECIAL CARE FACTORS FREQUENCY  ?PT (By licensed PT), OT (By licensed OT), Speech therapy   ?  ?PT Frequency: 5 x week ?OT Frequency: 5 x week ?  ?  ?Speech Therapy Frequency: 5 x week ?   ? ? ?Contractures Contractures Info: Not present  ? ? ?Additional Factors Info  ?Code Status, Allergies Code Status Info: Full code ?Allergies Info: Codeine, Toradol (Ketorolac Tromethamine), Tramadol Hcl ?Psychotropic Info: Adjustment disorder with mixed anxiety/depression. ?  ?  ?   ? ?Current Medications (03/13/2022):  This is the current hospital active medication list ?Current Facility-Administered Medications  ?Medication Dose Route Frequency Provider Last Rate Last Admin  ? 0.9 %  sodium chloride infusion   Intravenous PRN 05/13/2022, MD   Stopped at 03/07/22 1649  ? 0.9 %  sodium chloride infusion  250 mL Intravenous PRN Esco, Miechia A, MD      ? acetaminophen (TYLENOL) tablet 650 mg  650 mg Oral Q4H PRN Esco, Miechia A, MD   650 mg at 03/12/22 1758  ? Ampicillin-Sulbactam (UNASYN) 3 g in sodium chloride 0.9 % 100 mL IVPB  3 g Intravenous Q6H 05/12/22, MD 200 mL/hr at 03/13/22 0559 3 g at  03/13/22 0559  ? aspirin EC tablet 81 mg  81 mg Oral Daily Kathrynn RunningWouk, Noah Bedford, MD   81 mg at 03/13/22 0818  ? Chlorhexidine Gluconate Cloth 2 % PADS 6 each  6 each Topical Daily Erin FullingKasa, Kurian, MD   6 each at 03/13/22 0818  ? clopidogrel (PLAVIX) tablet 75 mg  75 mg Oral Daily Kathrynn RunningWouk, Noah Bedford, MD   75 mg at 03/13/22 0818  ? enoxaparin (LOVENOX) injection 37.5 mg  0.5 mg/kg Subcutaneous Q24H Kathrynn RunningWouk, Noah Bedford, MD   37.5 mg at 03/12/22  2120  ? feeding supplement (ENSURE ENLIVE / ENSURE PLUS) liquid 237 mL  237 mL Oral BID BM Wouk, Wilfred CurtisNoah Bedford, MD   237 mL at 03/13/22 0818  ? hydrALAZINE (APRESOLINE) injection 10-20 mg  10-20 mg Intravenous Q6H PRN Rust-Chester, Britton L, NP   20 mg at 03/07/22 0414  ? insulin aspart (novoLOG) injection 0-20 Units  0-20 Units Subcutaneous Q4H Judithe ModestKeene, Jeremiah D, NP   4 Units at 03/13/22 0820  ? insulin glargine-yfgn (SEMGLEE) injection 20 Units  20 Units Subcutaneous QHS Kathrynn RunningWouk, Noah Bedford, MD   20 Units at 03/12/22 2122  ? labetalol (NORMODYNE) injection 20 mg  20 mg Intravenous Q2H PRN Salena SanerGonzalez, Carmen L, MD   20 mg at 03/04/22 1841  ? MEDLINE mouth rinse  15 mL Mouth Rinse BID Erin FullingKasa, Kurian, MD   15 mL at 03/13/22 0818  ? metoCLOPramide (REGLAN) injection 5 mg  5 mg Intravenous Q8H Salena SanerGonzalez, Carmen L, MD   5 mg at 03/13/22 [id number]  ? multivitamin with minerals tablet 1 tablet  1 tablet Oral Daily Kathrynn RunningWouk, Noah Bedford, MD   1 tablet at 03/13/22 0818  ? nicotine (NICODERM CQ - dosed in mg/24 hours) patch 21 mg  21 mg Transdermal Daily Kathrynn RunningWouk, Noah Bedford, MD   21 mg at 03/13/22 [id number]  ? ondansetron (ZOFRAN) injection 4 mg  4 mg Intravenous Q6H PRN Esco, Miechia A, MD   4 mg at 03/09/22 1638  ? pantoprazole sodium (PROTONIX) 40 mg/20 mL oral suspension 40 mg  40 mg Per Tube Daily Salena SanerGonzalez, Carmen L, MD   40 mg at 03/13/22 0818  ? rosuvastatin (CRESTOR) tablet 10 mg  10 mg Oral Daily Lowella BandyGrubb, Rodney D, RPH   10 mg at 03/13/22 0818  ? sodium chloride flush (NS) 0.9 % injection 10-40 mL  10-40 mL Intracatheter Q12H Esco, Miechia A, MD   10 mL at 03/12/22 2131  ? sodium chloride flush (NS) 0.9 % injection 10-40 mL  10-40 mL Intracatheter PRN Esco, Miechia A, MD      ? sodium chloride flush (NS) 0.9 % injection 10-40 mL  10-40 mL Intracatheter Q12H Wouk, Wilfred CurtisNoah Bedford, MD   10 mL at 03/12/22 2124  ? sodium chloride flush (NS) 0.9 % injection 10-40 mL  10-40 mL Intracatheter PRN Wouk, Wilfred CurtisNoah Bedford, MD      ? sodium chloride flush  (NS) 0.9 % injection 3 mL  3 mL Intravenous Q12H Esco, Miechia A, MD   3 mL at 03/13/22 0819  ? sodium chloride flush (NS) 0.9 % injection 3 mL  3 mL Intravenous PRN Esco, Miechia A, MD   3 mL at 03/11/22 2040  ? TPN ADULT (ION)   Intravenous Continuous TPN Lowella BandyGrubb, Rodney D, RPH 40 mL/hr at 03/13/22 [id number] Infusion Verify at 03/13/22 0427  ? TPN ADULT (ION)   Intravenous Continuous TPN Lowella BandyGrubb, Rodney D, RPH      ? ? ? ?Discharge Medications: ?  Please see discharge summary for a list of discharge medications. ? ?Relevant Imaging Results: ? ?Relevant Lab Results: ? ? ?Additional Information ?SS#: [id number]. Will discharge on IV abx: Ceftriaxone 2gm IV q24h  End date: 04/07/2022 ? ?Margarito Liner, LCSW ? ? ? ? ?

## 2022-03-13 NOTE — Progress Notes (Signed)
? ?Date of Admission:  02/24/2022    ?ID: Veronica Murray is a 66 y.o. female  ?Principal Problem: ?  Post-operative state ?Active Problems: ?  Rupture of carotid artery (HCC) ?  CVA (cerebral vascular accident) [facility] ?  DM2 (diabetes mellitus, type 2) (HCC) ?  Hypokalemia ?  Dysphagia following cerebral infarction ?  Normocytic anemia ?  Leukocytosis ?  Thrombocytosis ?  Hyponatremia ?  Hip pain ? ? ? ?Subjective: ?Pt ding better ?Says she is feeling better ?No cough or sob ?Weakness rt arm ? ?Medications:  ? aspirin EC  81 mg Oral Daily  ? Chlorhexidine Gluconate Cloth  6 each Topical Daily  ? clopidogrel  75 mg Oral Daily  ? enoxaparin (LOVENOX) injection  0.5 mg/kg Subcutaneous Q24H  ? feeding supplement  237 mL Oral BID BM  ? insulin aspart  0-20 Units Subcutaneous Q4H  ? insulin glargine-yfgn  20 Units Subcutaneous QHS  ? mouth rinse  15 mL Mouth Rinse BID  ? metoCLOPramide (REGLAN) injection  5 mg Intravenous Q8H  ? multivitamin with minerals  1 tablet Oral Daily  ? nicotine  21 mg Transdermal Daily  ? pantoprazole sodium  40 mg Per Tube Daily  ? rosuvastatin  10 mg Oral Daily  ? sodium chloride flush  10-40 mL Intracatheter Q12H  ? sodium chloride flush  10-40 mL Intracatheter Q12H  ? sodium chloride flush  3 mL Intravenous Q12H  ? ? ?Objective: ?Vital signs in last 24 hours: ?Temp:  [97.3 ?F (36.3 ?C)-98.6 ?F (37 ?C)] 97.3 ?F (36.3 ?C) (05/03 0741) ?Pulse Rate:  [92-98] 98 (05/03 0741) ?Resp:  [16-19] 18 (05/03 0741) ?BP: (123-156)/(61-79) 123/71 (05/03 0741) ?SpO2:  [99 %-100 %] 99 % (05/03 0741) ? ?PHYSICAL EXAM:  ?General: awake, alert, follows commands ?Lungs:b/la ir entry. ?Heart: s1s2. ?Abdomen: Soft, non-tender,not distended. Bowel sounds normal. No masses ?Extremities: left PICc ?Skin: No rashes or lesions. Or bruising ?Lymph: Cervical, supraclavicular normal. ?Neurologic: rt arm weakness ?Surgical suture line left side of the neck healing well ? ?Lab Results ?Recent Labs  ?  03/11/22 ?0458  03/12/22 ?0354 03/13/22 ?0409  ?WBC 7.3 7.8  --   ?HGB 9.4* 9.2*  --   ?HCT 27.5* 28.0*  --   ?NA 134* 135 136  ?K 4.1 3.5 3.7  ?CL 102 100 101  ?CO2 26 25 27   ?BUN 19 21 17   ?CREATININE 0.39* 0.47 0.45  ? ?Microbiology: ?Wound culture strep constellatus ?Blood culture NG ? ? 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  ?Infected graft with blow out and hemorrhage ?Patch removal and repair on 4/17 ?Stent placement on 4/20 ?  ?Streptococcus constellatus infection- this is endovascular infection and to be treated with atleast 6 weeks IV followed by Po ?Currently on uansyn- continue.would do ceftriaxone IV as OPAT on discharge until 04/07/22.  May need p.o. antibiotics following that. ? ?CVA :Acute RT MCA infarct and also multiple small acute/early subacute cortical and subcortical infarcts with petechial hemorrhage associated with some of these infarcts.  Patient is currently on aspirin Plavix and statin. ? ?Dysphagia.  Had NG tube which was self discontinued.  Speech following patient and has advance diet.  Also getting TPN.  Watch for aspiration. ?  ?Anemia has received PRBC multiple units ?  ?DM on insulin ?  ?Discussed the management with patient and pharmacist ?ID will sign off- call if needed ? ? ?OPAT Orders ?  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 ? ?Allergies  ?Allergen Reactions  ? Codeine Hives, Shortness Of Breath and Nausea Only  ? Toradol [Ketorolac Tromethamine] Hives, Shortness Of Breath and Nausea Only  ? Tramadol Hcl Shortness Of Breath and Nausea Only  ?  "Conflicts with bipolar condition"  ? ? ?OPAT Orders ?Discharge antibiotics: ?Ceftriaxone 2 grams IV every 24 hours for a total of 6 weeks ? ?End Date: ?04/07/22 ? ?[facility] Care Per Protocol:including placement of biopatch ? ?Labs weekly while on IV antibiotics: ?X_ CBC with  differential ?_ ?_X_ CMP ? ? ?_X_ Please pull PIC at completion of IV antibiotics ?_ ?Fax weekly labs to [phone] ? ?Clinic Follow Up Appt: with Dr.Kein Carlberg 04/02/22 at 10.30 Am ? ? ?Call [phone] with any questions or concerns ? ?  ? ?

## 2022-03-13 NOTE — Progress Notes (Signed)
Physical Therapy Treatment ?Patient Details ?Name: Veronica HeimlichMargaret M Soland ?MRN: [id number] ?DOB: July 02, 1956 ?Today's Date: 03/13/2022 ? ? ?History of Present Illness 66 yo F transferred emergently to [facility] ED for emergent vascular intervention on a ruptured carotid endarterectomy wound. Intubated on 4/16, extubated on 4/24. 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 Petechial hemorrhage at this site."  Hospital course significant for L neck exploration with surture repair to L carotid (02/24/22) and placement of embolic protective device L carotid (02/28/22) ? ?  ?PT Comments  ? ? Pt received in bed agreeable to PT session despite stomach pain from breakfast. Pt is supervision with increased time to reach sitting EOB. Pt able to stand with minguard and ambulate with decreased step lengths and step heights with UE's in guarded position for balance, ambulating to window to see outside. Pt performing with PT standing dual task of cognitive +L head rotation and L UE reaching outside BOS to sticky notes with varying numbers and letters. Pt able to correctly identify all symbols but with single step commands to tap correct symbol on LUE after PT tells pt which one to touch, pt consistently touches the wrong sticky note. Increased time and max multimodal cues needed to stay on task throughout. Pt then tolerating ~180' of gait around nurses station with focus on L head turns and identifying colors/symbols on wall. Noted decrease in gait speed and increased processing time with max multimodal cues for naming objects/colors with L head turns. Overall x1 LOB butr pt able to correct with minguard with stepping strategy. Pt returned to room in recliner with fall precautions in place. Pt appears to be progressing in gait/balance with reduced instances of LOB and more consistent step through gait  but remains needing mod to max multimodal cuing to stay on task and maintain participation throughout session along with maintaining poor awareness of deficits. If CIR is not appropriate, rec STR at d/c to maximize gait, balance, and strength prior to transitioning to home environment. ?   ?Recommendations for follow up therapy are one component of a multi-disciplinary discharge planning process, led by the attending physician.  Recommendations may be updated based on patient status, additional functional criteria and insurance authorization. ? ?Follow Up Recommendations ? Acute inpatient rehab (3hours/day) ?  ?  ?Assistance Recommended at Discharge Frequent or constant Supervision/Assistance  ?Patient can return home with the following A lot of help with walking and/or transfers;A lot of help with bathing/dressing/bathroom;Assistance with cooking/housework;Direct supervision/assist for financial management;Assist for transportation;Direct supervision/assist for medications management;Help with stairs or ramp for entrance ?  ?Equipment Recommendations ? Rolling walker (2 wheels)  ?  ?Recommendations for Other Services   ? ? ?  ?Precautions / Restrictions Precautions ?Precautions: Fall ?Restrictions ?Weight Bearing Restrictions: No  ?  ? ?Mobility ? Bed Mobility ?Overal bed mobility: Modified Independent, Needs Assistance ?Bed Mobility: Supine to Sit ?  ?  ?Supine to sit: Supervision, HOB elevated ?  ?  ?General bed mobility comments: Requires increased time to perform ?Patient Response: Cooperative ? ?Transfers ?Overall transfer level: Needs assistance ?Equipment used: None ?Transfers: Sit to/from Stand ?Sit to Stand: Min guard ?  ?  ?  ?  ?  ?  ?  ? ?Ambulation/Gait ?Ambulation/Gait assistance: Min guard ?Gait Distance (Feet): 180 Feet ?Assistive device: None ?Gait Pattern/deviations: Decreased step length - right, Decreased step length - left, Decreased weight shift to left ?  ?  ?  ?  General Gait Details:  Improvement in gait with overall consistent step lengths but remains limited in step lengths and height. ONly x1 mino LOB with head turns but pt able to correct on her own without PT physical assist. ? ? ?Stairs ?  ?  ?  ?  ?  ? ? ?Wheelchair Mobility ?  ? ?Modified Rankin (Stroke Patients Only) ?  ? ? ?  ?Balance Overall balance assessment: Needs assistance ?Sitting-balance support: No upper extremity supported, Feet supported ?Sitting balance-Leahy Scale: Good ?  ?  ?Standing balance support: No upper extremity supported ?Standing balance-Leahy Scale: Fair ?Standing balance comment: can maintain static standing without LOB ?  ?  ?  ?  ?  ?  ?  ?  ?  ?  ?  ?  ? ?  ?Cognition Arousal/Alertness: Awake/alert ?Behavior During Therapy: Flat affect ?Overall Cognitive Status: Impaired/Different from baseline ?Area of Impairment: Attention, Following commands, Safety/judgement, Awareness ?  ?  ?  ?  ?  ?  ?  ?  ?  ?Current Attention Level: Divided ?  ?Following Commands: Follows one step commands inconsistently, Follows one step commands with increased time ?Safety/Judgement: Decreased awareness of deficits, Decreased awareness of safety ?  ?Problem Solving: Slow processing, Difficulty sequencing, Requires verbal cues, Requires tactile cues, Decreased initiation ?General Comments: Attention overall divided. Pt requiring frequent multimodal cuing and re-direction to stay on task ?  ?  ? ?  ?Exercises Other Exercises ?Other Exercises: Standing L side dual tasking naming letters/numbers on sticky note with LUE reaching outside BOS (3 minutes) ? ?  ?General Comments   ?  ?  ? ?Pertinent Vitals/Pain Pain Assessment ?Pain Assessment: Faces ?Faces Pain Scale: Hurts a little bit ?Pain Location: abdomen ?Pain Descriptors / Indicators: Aching, Grimacing, Guarding ?Pain Intervention(s): Limited activity within patient's tolerance, Repositioned  ? ? ?Home Living   ?  ?  ?  ?  ?  ?  ?  ?  ?  ?   ?  ?Prior Function    ?  ?  ?   ? ?PT  Goals (current goals can now be found in the care plan section) Acute Rehab PT Goals ?Patient Stated Goal: get better ?PT Goal Formulation: With patient ?Time For Goal Achievement: 03/18/22 ?Potential to Achieve Goals: Good ?Progress towards PT goals: Progressing toward goals ? ?  ?Frequency ? ? ? 7X/week ? ? ? ?  ?PT Plan Current plan remains appropriate  ? ? ?Co-evaluation   ?  ?  ?  ?  ? ?  ?AM-PAC PT "6 Clicks" Mobility   ?Outcome Measure ? Help needed turning from your back to your side while in a flat bed without using bedrails?: None ?Help needed moving from lying on your back to sitting on the side of a flat bed without using bedrails?: A Little ?Help needed moving to and from a bed to a chair (including a wheelchair)?: A Little ?Help needed standing up from a chair using your arms (e.g., wheelchair or bedside chair)?: A Little ?Help needed to walk in hospital room?: A Little ?Help needed climbing 3-5 steps with a railing? : A Lot ?6 Click Score: 18 ? ?  ?End of Session Equipment Utilized During Treatment: Gait belt ?Activity Tolerance: Patient tolerated treatment well ?Patient left: in chair;with chair alarm set;with call bell/phone within reach ?Nurse Communication: Mobility status ?PT Visit Diagnosis: Muscle weakness (generalized) (M62.81);Difficulty in walking, not elsewhere classified (R26.2);Hemiplegia and hemiparesis;Unsteadiness on feet (R26.81) ?Hemiplegia - Right/Left: Left ?Hemiplegia -  dominant/non-dominant: Non-dominant ?Hemiplegia - caused by: Cerebral infarction ?  ? ? ?Time: 9767-3419 ?PT Time Calculation (min) (ACUTE ONLY): 27 min ? ?Charges:  $Neuromuscular Re-education: 23-37 mins          ?          ?Delphia Grates. Fairly IV, PT, DPT ?Physical Therapist- [place]  ?[facility]  ?03/13/2022, 12:18 PM ? ?

## 2022-03-13 NOTE — Progress Notes (Signed)
Calorie Count Day 1 ? ?Estimated Nutritional Needs:  ?  ?Kcal:  1800-2100kcal/day ?Protein:  90-105g/day ?Fluid:  1.5-1.8L/day ? ?Dinner 5/2: 75% egg salad, 100% cottage cheese, 0% Ensure ? ?Total- 194kcal, 21g protein ? ?Breakfast 5/3: 20% of bowl of cheerios with whole milk, 0% Ensure ? ?Total- 88kcal and 4g protein  ? ?Lunch 5/3: bites of grilled chicken, mashed potatoes, broccoli and pineapple, 0% Ensure  ? ?Total- 75kcal and 3g protein  ? ?Total Intake:  357kcal (20% estimated needs) and 28g protein (30% estimated needs) ? ?Betsey Holiday MS, RD, LDN ?Please refer to [facility] for RD and/or RD on-call/weekend/after hours pager ? ? ?

## 2022-03-13 NOTE — Progress Notes (Signed)
Occupational Therapy Treatment ?Patient Details ?Name: Veronica Murray ?MRN: [id number] ?DOB: [birth year] ?Today's Date: 03/13/2022 ? ? ?History of present illness 66 yo F transferred emergently to [facility] ED for emergent vascular intervention on a ruptured carotid endarterectomy wound. Intubated on 4/16, extubated on 4/24. 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 Petechial hemorrhage at this site."  Hospital course significant for L neck exploration with surture repair to L carotid (02/24/22) and placement of embolic protective device L carotid (02/28/22) ?  ?OT comments ? Ms Macfadden was seen for OT treatment on this date. Upon arrival to room pt seated in chair, reports being in a "funk" and agreeable to chair level intervention. Pt requires MOD A don B socks in sitting. MAX step by step cues to complete copying task and line bisection test - identifies 2/30 lines correctly. MIN cues hair brushing seated in chair.  ?CGA sit<>stand x 2 trials. Pt making good progress toward goals. Pt continues to benefit from skilled OT services to maximize return to PLOF and minimize risk of future falls, injury, caregiver burden, and readmission. Will continue to follow POC. Discharge recommendation remains appropriate.  ?  ? ?Recommendations for follow up therapy are one component of a multi-disciplinary discharge planning process, led by the attending physician.  Recommendations may be updated based on patient status, additional functional criteria and insurance authorization. ?   ?Follow Up Recommendations ? Acute inpatient rehab (3hours/day)  ?  ?Assistance Recommended at Discharge Frequent or constant Supervision/Assistance  ?Patient can return home with the following ? Assistance with cooking/housework;Assist for transportation;Direct supervision/assist for medications  management;Direct supervision/assist for financial management;Help with stairs or ramp for entrance;A lot of help with walking and/or transfers;A lot of help with bathing/dressing/bathroom;Assistance with feeding ?  ?Equipment Recommendations ? BSC/3in1  ?  ?Recommendations for Other Services   ? ?  ?Precautions / Restrictions Precautions ?Precautions: Fall ?Restrictions ?Weight Bearing Restrictions: No  ? ? ?  ? ?Mobility Bed Mobility ?  ?  ?  ?  ?  ?  ?  ?General bed mobility comments: received and left in bed ?  ? ?Transfers ?Overall transfer level: Needs assistance ?Equipment used: None ?Transfers: Sit to/from Stand ?Sit to Stand: Min guard ?  ?  ?  ?  ?  ?  ?  ?  ?Balance Overall balance assessment: Needs assistance ?Sitting-balance support: No upper extremity supported, Feet supported ?Sitting balance-Leahy Scale: Good ?  ?  ?Standing balance support: No upper extremity supported ?Standing balance-Leahy Scale: Fair ?  ?  ?  ?  ?  ?  ?  ?  ?  ?  ?  ?  ?   ? ?ADL either performed or assessed with clinical judgement  ? ?ADL Overall ADL's : Needs assistance/impaired ?  ?  ?  ?  ?  ?  ?  ?  ?  ?  ?  ?  ?  ?  ?  ?  ?  ?  ?  ?General ADL Comments: MOD A don B socks in sitting. MAX step by step cues to complete copying task ?  ? ? ? ?Cognition Arousal/Alertness: Awake/alert ?Behavior During Therapy: Flat affect ?Overall Cognitive Status: Impaired/Different from baseline ?  ?  ?  ?  ?  ?  ?  ?  ?  ?  ?  ?  ?  ?  ?  ?  ?General  Comments: L neglect, diffculty initiating and completing tasks ?  ?  ?   ?   ?   ?   ? ? ?Pertinent Vitals/ Pain       Pain Assessment ?Pain Assessment: 0-10 ?Pain Score: 5  ?Pain Location: abdomen ?Pain Descriptors / Indicators: Aching, Grimacing, Guarding ?Pain Intervention(s): Limited activity within patient's tolerance, Repositioned, Patient requesting pain meds-RN notified ? ? ?Frequency ? Min 4X/week  ? ? ? ? ?  ?Progress Toward Goals ? ?OT Goals(current goals can now be found in the care  plan section) ? Progress towards OT goals: Progressing toward goals ? ?Acute Rehab OT Goals ?Patient Stated Goal: to go home ?OT Goal Formulation: With patient/family ?Time For Goal Achievement: 03/23/22 ?Potential to Achieve Goals: Good ?ADL Goals ?Pt Will Perform Grooming: sitting;with min assist ?Pt Will Transfer to Toilet: with mod assist;with +2 assist;bedside commode;stand pivot transfer ?Additional ADL Goal #1: Pt will engage in ADL task requiring MIN VC for sequencing with pt able to follow simple commands with increased time to complete, 5/5 opportunities.  ?Plan Discharge plan remains appropriate;Frequency remains appropriate   ? ?Co-evaluation ? ? ?   ?  ?  ?  ?  ? ?  ?AM-PAC OT "6 Clicks" Daily Activity     ?Outcome Measure ? ? Help from another person eating meals?: A Little ?Help from another person taking care of personal grooming?: A Lot ?Help from another person toileting, which includes using toliet, bedpan, or urinal?: A Lot ?Help from another person bathing (including washing, rinsing, drying)?: A Lot ?Help from another person to put on and taking off regular upper body clothing?: A Little ?Help from another person to put on and taking off regular lower body clothing?: A Lot ?6 Click Score: 14 ? ?  ?End of Session Equipment Utilized During Treatment: Rolling walker (2 wheels) ? ?OT Visit Diagnosis: Other abnormalities of gait and mobility (R26.89);Hemiplegia and hemiparesis;Other symptoms and signs involving cognitive function ?  ?Activity Tolerance Patient tolerated treatment well ?  ?Patient Left in chair;with call bell/phone within reach;with chair alarm set;with family/visitor present;with nursing/sitter in room ?  ?Nurse Communication Mobility status ?  ? ?   ? ?Time: 3382-5053 ?OT Time Calculation (min): 30 min ? ?Charges: OT General Charges ?$OT Visit: 1 Visit ?OT Treatments ?$Self Care/Home Management : 8-22 mins ?$Therapeutic Activity: 8-22 mins ? ?Kathie Dike, M.S. OTR/L  ?03/13/22,  4:35 PM  ?ascom [phone] ? ?

## 2022-03-14 DIAGNOSIS — Z9889 Other specified postprocedural states: Secondary | ICD-10-CM | POA: Diagnosis not present

## 2022-03-14 LAB — COMPREHENSIVE METABOLIC PANEL
ALT: 15 U/L (ref 0–44)
AST: 19 U/L (ref 15–41)
Albumin: 2.6 g/dL — ABNORMAL LOW (ref 3.5–5.0)
Alkaline Phosphatase: 89 U/L (ref 38–126)
Anion gap: 5 (ref 5–15)
BUN: 16 mg/dL (ref 8–23)
CO2: 29 mmol/L (ref 22–32)
Calcium: 8.7 mg/dL — ABNORMAL LOW (ref 8.9–10.3)
Chloride: 102 mmol/L (ref 98–111)
Creatinine, Ser: 0.39 mg/dL — ABNORMAL LOW (ref 0.44–1.00)
GFR, Estimated: >60 mL/min (ref >60)
Glucose, Bld: 143 mg/dL — ABNORMAL HIGH (ref 70–99)
Potassium: 3.9 mmol/L (ref 3.5–5.1)
Sodium: 136 mmol/L (ref 135–145)
Total Bilirubin: 0.3 mg/dL (ref 0.3–1.2)
Total Protein: 5.9 g/dL — ABNORMAL LOW (ref 6.5–8.1)

## 2022-03-14 LAB — GLUCOSE, CAPILLARY
Glucose-Capillary: 157 mg/dL — ABNORMAL HIGH (ref 70–99)
Glucose-Capillary: 159 mg/dL — ABNORMAL HIGH (ref 70–99)
Glucose-Capillary: 163 mg/dL — ABNORMAL HIGH (ref 70–99)
Glucose-Capillary: 203 mg/dL — ABNORMAL HIGH (ref 70–99)
Glucose-Capillary: 207 mg/dL — ABNORMAL HIGH (ref 70–99)
Glucose-Capillary: 224 mg/dL — ABNORMAL HIGH (ref 70–99)

## 2022-03-14 LAB — CREATININE, SERUM
Creatinine, Ser: 0.4 mg/dL — ABNORMAL LOW (ref 0.44–1.00)
GFR, Estimated: >60 mL/min (ref >60)

## 2022-03-14 LAB — PHOSPHORUS: Phosphorus: 4.5 mg/dL (ref 2.5–4.6)

## 2022-03-14 LAB — MAGNESIUM: Magnesium: 1.8 mg/dL (ref 1.7–2.4)

## 2022-03-14 MED ORDER — PANTOPRAZOLE 2 MG/ML SUSPENSION
40.0000 mg | Freq: Every day | ORAL | Status: DC
  Administered 2022-03-14 – 2022-03-15 (×2): 40 mg via ORAL
  Filled 2022-03-14 (×2): qty 20

## 2022-03-14 MED ORDER — TRAVASOL 10 % IV SOLN
INTRAVENOUS | Status: DC
  Filled 2022-03-14: qty 566.4

## 2022-03-14 MED ORDER — HYDROXYZINE HCL 10 MG PO TABS
10.0000 mg | ORAL_TABLET | Freq: Three times a day (TID) | ORAL | Status: DC | PRN
  Administered 2022-03-14: 10 mg via ORAL
  Filled 2022-03-14 (×3): qty 1

## 2022-03-14 NOTE — Progress Notes (Signed)
Mobility Specialist - Progress Note ? ? 03/14/22 1200  ?Mobility  ?Activity Ambulated with assistance in hallway  ?Level of Assistance Standby assist, set-up cues, supervision of patient - no hands on  ?Assistive Device Front wheel walker  ?Distance Ambulated (ft) 200 ft  ?Activity Response Tolerated well  ?$Mobility charge 1 Mobility  ? ? ? ?Pt sitting in recliner upon arrival, utilizing RA. Pt is a little emotional this date---states she's ready to go home. Pt ambulated in hallway with supervision. No LOB. Pt does navigate objects well this date, but does need some cueing to maintain forward glance as pt eyes does tend to wander often. Pt returned to chair with alarm set, needs in reach.  ? ? ?Filiberto Pinks ?Mobility Specialist ?03/14/22, 12:26 PM ? ? ? ? ?

## 2022-03-14 NOTE — Progress Notes (Signed)
Calorie Count Day 2 ? ?Estimated Nutritional Needs:  ?  ?Kcal:  1800-2100kcal/day ?Protein:  90-105g/day ?Fluid:  1.5-1.8L/day ? ?Dinner 5/3: 2 hushpuppies, 1/2 cup shrimp, 1/2 baked potato, 3 bites slaw ? ?Total- 380kcal, 17g protein ? ?Breakfast 5/4: 50% of an omelet, 0% Ensure  ? ?Total- 120kcal and 4g protein  ? ?Lunch 5/4: 75% of pot roast, mashed potatoes and peaches, 0% Ensure ? ?Total- 466kcal and 23g protein  ? ?Total Intake:  966kcal (54% estimated needs) and 44g protein (49% estimated needs) ? ?Koleen Distance MS, RD, LDN ?Please refer to [facility] for RD and/or RD on-call/weekend/after hours pager ? ? ?

## 2022-03-14 NOTE — Progress Notes (Signed)
?PROGRESS NOTE ? ? ?HPI was taken from Dr. Lorenso Courier: ?History of Present Illness: Patient s/p LEFT CEA on 01/25/2022 at our facility secondary to TIA and high grade stenosis. The patient had been well, presented for follow up without issue. Noted on 02/20/2022 she developed sudden swelling, pain and murky drainage from the incision. She was evaluated by urgent care on 02/22/2022 and it was felt she had a wound infection. She was treated with ABX. Last night the patient developed significant bleeding and hypotension to the 70s. Taken by EMS to [facility], digital pressure held, transfused pRBC. Neurologically intact. [facility] by Forestine Na [facility] the [facility] was [facility], with active bleeding. That facility and Vascular surgeon did not accept the patient. I accepted the patient for emergent intervention. Upon arrival EMS was holding pressure at neck incision, the patient was awake, alert and mentating and aware of situation. She Understood significant risks of bleeding, stroke and death. Consent obtained. ?  ?As per Dr. Mortimer Fries: ?02/25/22: Admit to ICU s/p left carotid artery repair in the setting of endarterectomy carotid rupture due to left carotid patch infection remained mechanically intubated postop ?02/28/22: Pt underwent left carotid artery stent placement  ?02/28/22: Will perform SBT with plans for possible extubation.  Pt unable to follow commands during [facility] CT Head revealed subacute right MCA stroke as well as recent but older-appearing left MCA strokes x2.  Neurology consulted.  Unable to extubate  ?4/23 MRI brain: Moderate-to-large acute/early subacute right MCA territory infarct within the cortical/subcortical right frontal lobe as well as right insula and subinsular region. Petechial hemorrhage at this site. Local mass effect without significant ventricular ?4/23 NEUROLOGY CONSULTED ? ?5/3: Resumed care today.  Patient with no new  complaints.  P.o. intake remained poor, only consuming 30% of the required nutrition.  Dietitian is requesting TPN for another day with a slower rate and can consider G-tube placement.  Patient and daughter to decide tomorrow, if he agrees then we will order G-tube placement. ?No family support after the CIR as recommended by PT initially, will be going to SNF for rehab. ? ?5/4: Patient remained stable with improvement in p.o. intake.  Able to consume about 30% of meals.  Does not want any feeding tube placement.  Stating that she will try her best to continue eating as she is feeling that she is improving and her energy is coming back.  TOC is working on placement. ?Dietitian wants to continue TPN till discharge. ? ?Assessment & Plan: ?  ?Principal Problem: ?  Post-operative state ?Active Problems: ?  Rupture of carotid artery ([place]) ?  CVA (cerebral vascular accident) [facility]) ?  DM2 (diabetes mellitus, type 2) ([place]) ?  Hypokalemia ?  Dysphagia following cerebral infarction ?  Normocytic anemia ?  Leukocytosis ?  Thrombocytosis ?  Hyponatremia ?  Hip pain ? ?Assessment and Plan: ? ?Acute hypoxic & hypercapnic respiratory failure: s/p intubation, ventilation & extubation. Now weaned off o2, breathing comfortably. At high risk for aspiration ? 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 Stable, vascular surgery following. Continuing aspirin and plavix. No signs re-bleeding.  ? ?Strep constellatus infection: endovascular infection, ID following, advising at least 6 wks IV abx. Advising continue unasyn for now, switch to ceftriaxone as OPAT, through 5/28. ? ?CVA: MRI brain 4/22 shows multiple small acute/early subacute cortical & subcortical infarcts w/ petechial hemorrhage associated  w/ some of these infarcts. Chronic infarcts also noted. Now on aspirin and plavix and statin.  Echo shows EF 60-65%, no regional wall motion abnormalities, diastolic  function is normal, & atrial septum is grossly normal. Neuro recs apprec, they have signed off. Repeat head ct on 4/28 stable cva, no hemorrhage.  No family support after CIR, daughter is interested in going to SNF for rehab. ? ?Dysphagia: remains at high risk for aspiration. Has self-discontinued NG tube several times. IR declines to place again due to this. SLP following, has now advanced diet and patient is tolerating. Continuing tpn for now, with a slow rate.   ?Patient does not want any G-tube placement and working on p.o. nutrition which seems improving. ?-Continue with TPN at a lower dose until discharge. ? ?Diarrhea? Sitter says small volume diarrhea on 4/28. Nursing collected loose stool, gi pathogen panel neg. No fever, no leukocytosis, and no diarrhea since, thus holding off on further testing. ? ?Abdominal pain: resolved. labs and CT imaging unremarkable save for distended bladder though patient able to void spontaneously, ua not suggestive of infection, PVR wnl. Will continue to monitor. ? ?Hyponatremia: resolved ? ?Hip pain: no fall. Pain resolved. X-ray 4/26 w/o signs fracture ? ?DM2: glucose elevated with tpn, will increase semglee to 20, continue SSI ? ?Hypokalemia: monitor and replace as needed.  ? ?Normocytic anemia: H&H are stable. Will transfuse if Hb < 7.0 ? ? ?DVT prophylaxis: lovenox ?Code Status:  full  ?Family Communication:  ?Disposition Plan: SNF ? ?Level of care: Med-Surg ? ?Status is: Inpatient ?Remains inpatient appropriate because: severity of illness ? ? ?Consultants:  ?ICU ?Vasc surg  ?Neuro  ? ?Procedures: ? ?Antimicrobials: unasyn  ? ?Subjective: ?Patient thinks that she is improving and regaining strength.  Discussed about having a G-tube placement as advised by dietitian but she does not want any feeding tube.  She seems very motivated to keep working on her p.o. intake. ? ?Objective: ?Vitals:  ? 03/13/22 2125 03/14/22 0419 03/14/22 0424 03/14/22 [id number]  ?BP: (!) 144/62  (!)  145/74 131/73  ?Pulse: 92  85 85  ?Resp: 18  18 18   ?Temp: 98.6 ?F (37 ?C)  98.2 ?F (36.8 ?C) 98 ?F (36.7 ?C)  ?TempSrc: Oral   Oral  ?SpO2: 98%  99% 99%  ?Weight:  73.9 kg    ?Height:      ? ? ?Intake/Output Summary (Last 24 hours) at 03/14/2022 1434 ?Last data filed at 03/14/2022 1048 ?Gross per 24 hour  ?Intake 1856.45 ml  ?Output --  ?Net 1856.45 ml  ? ? ? ?Filed Weights  ? 03/09/22 0416 03/11/22 0500 03/14/22 0419  ?Weight: 74.7 kg 75.3 kg 73.9 kg  ? ? ?Examination: ? ?General.  Well-developed lady, in no acute distress. ?Pulmonary.  Lungs clear bilaterally, normal respiratory effort. ?CV.  Regular rate and rhythm, no JVD, rub or murmur. ?Abdomen.  Soft, nontender, nondistended, BS positive. ?CNS.  Alert and oriented .  No focal neurologic deficit. ?Extremities.  No edema, no cyanosis, pulses intact and symmetrical. ?Psychiatry.  Judgment and insight appears normal.  ? ?Data Reviewed: I have personally reviewed following labs and imaging studies ? ?CBC: ?Recent Labs  ?Lab 03/07/22 ?1859 03/07/22 ?2047 03/09/22 ?0510 03/10/22 ?[id number] 03/11/22 ?[id number] 03/12/22 ?0354  ?WBC 5.6 7.5 5.4 5.8 7.3 7.8  ?NEUTROABS 3.3 4.6  --   --   --   --   ?HGB 7.0* 9.8* 9.0* 8.6* 9.4* 9.2*  ?HCT 20.6* 28.9* 26.9* 26.0* 27.5* 28.0*  ?  MCV 88.0 86.3 87.9 88.7 89.0 89.2  ?PLT 437* 617* 599* 497* 508* 507*  ? ? ?Basic Metabolic Panel: ?Recent Labs  ?Lab 03/08/22 ?1311 03/09/22 ?[id number] 03/10/22 ?[id number] 03/11/22 ?[id number] 03/12/22 ?0354 03/13/22 ?[id number] 03/14/22 ?[id number]  ?NA 135 134* 133* 134* 135 136 136  ?K 3.4* 3.1* 3.6 4.1 3.5 3.7 3.9  ?CL 98 98 101 102 100 101 102  ?CO2 27 28 28 26 25 27 29   ?GLUCOSE 129* 218* 218* 279* 168* 165* 143*  ?BUN 11 13 13 19 21 17 16   ?CREATININE 0.44 0.36* 0.45 0.39* 0.47 0.45 0.39*  0.40*  ?CALCIUM 8.6* 8.2* 8.1* 8.3* 8.2* 9.1 8.7*  ?MG 1.9 1.9 1.8 1.9  --  1.9 1.8  ?PHOS 3.7 3.5 3.8 3.8  --   --  4.5  ? ? ?GFR: ?Estimated Creatinine Clearance: 67.7 mL/min (A) (by C-G formula based on SCr of 0.4 mg/dL (L)). ?Liver Function  Tests: ?Recent Labs  ?Lab 03/07/22 ?2047 03/08/22 ?1311 03/09/22 ?0510 03/11/22 ?[id number] 03/14/22 ?[id number]  ?AST 22 23 18 17 19   ?ALT 24 22 19 13 15   ?ALKPHOS 115 116 100 82 89  ?BILITOT 0.8 0.8 0.5 0.5 0.3  ?PROT 5.9* 6

## 2022-03-14 NOTE — TOC Progression Note (Addendum)
Transition of Care (TOC) - Progression Note  ? ? ?Patient Details  ?Name: Veronica Murray ?MRN: [id number] ?Date of Birth: Mar 24, 1956 ? ?Transition of Care (TOC) CM/SW Contact  ?Margarito Liner, LCSW ?Phone Number: ?03/14/2022, 9:20 AM ? ?Clinical Narrative:   Per AD, husband called AC and wants CSW to call him ASAP to talk about why patient is going to a nursing home. Patient reportedly called him and told him she doesn't understand why she has to go to a facility. CSW went by room to discuss with patient. She did say she does not want to go to rehab but we discussed need for IV abx at discharge, lack of supervision during the day, etc.  ? ?Per TOC note on 4/25: "Spoke with patient at the bedside with the bedside RN present.  Patient expressed that she does not want her husband to make decisions for her, she prefers that it be Veronica Murray, her daughter.  She and her husband have been separated and she does not want to live with him, she does agree for short term rehab."   ? ?CSW confirmed with patient that she still wants her daughter to be decision-maker over her husband. Discussed with [facility] supervisor who confirmed as long as patient is AOx4, she can make her own decisions regardless and that husband can address concerns with wife and family. Patient does appear slightly confused this morning. Per RN, she perks up more in the afternoon. Will follow up with her this afternoon. ? ?11:11 am: Followed up with patient regarding our conversation this morning. She said her husband is going to buy a camper for them to live in on some friends land. Discussed that she does not have a PCP so home health may not be able to start immediately. Per ID note, stop date for IV abx is 5/28. Patient agreed that this is not far off and is willing to going to rehab to avoid having to manage IV abx on her own or by husband. This will also give husband more time to get their living situation finalized.  ? ?12:54 pm: Per CMA, husband left message  at 12:08 requesting call back. Patient is aware and wants CSW to continue talking to daughter instead. Will follow up this afternoon with [facility] bed offers. Patient is still agreeable to placement. ? ?3:23 pm: Reviewed bed offers with patient: [facility] and [facility]. She prefers [facility] because it is in [place] and more convenient for family. Called daughter Veronica Murray and she agreed. Uploaded clinicals into [facility] portal to start insurance authorization. ? ?Expected Discharge Plan: Skilled Nursing Facility ?Barriers to Discharge: Continued Medical Work up ? ?Expected Discharge Plan and Services ?Expected Discharge Plan: Skilled Nursing Facility ?  ?Discharge Planning Services: CM Consult ?Post Acute Care Choice: Skilled Nursing Facility ?Living arrangements for the past 2 months: Hotel/Motel ?                ?DME Arranged: N/A ?DME Agency: NA ?  ?  ?  ?  ?  ?  ?  ?  ? ? ?Social Determinants of Health (SDOH) Interventions ?  ? ?Readmission Risk Interventions ?   ? View : No data to display.  ?  ?  ?  ? ? ?

## 2022-03-14 NOTE — Progress Notes (Signed)
SLP Cancellation Note ? ?Patient Details ?Name: Veronica Murray ?MRN: [id number] ?DOB: 30-Jun-1956 ? ? ?Cancelled treatment:       Reason Eval/Treat Not Completed: Patient unavailable  ? ?Cognitive-linguistic tx unable to be completed at this time as pt working with PT. Will continue efforts as appropriate. ? ?Clyde Canterbury, M.S., CCC-SLP ?Speech-Language Pathologist ?[facility] ?[phone])  ? ?Woodroe Chen ?03/14/2022, 10:17 AM ?

## 2022-03-14 NOTE — Plan of Care (Signed)
?  Problem: Education: ?Goal: Understanding of CV disease, CV risk reduction, and recovery process will improve ?Outcome: Progressing ?Goal: Individualized Educational Video(s) ?Outcome: Progressing ?  ?Problem: Activity: ?Goal: Ability to return to baseline activity level will improve ?Outcome: Progressing ?  ?Problem: Cardiovascular: ?Goal: Ability to achieve and maintain adequate cardiovascular perfusion will improve ?Outcome: Progressing ?Goal: Vascular access site(s) Level 0-1 will be maintained ?Outcome: Progressing ?  ?Problem: Health Behavior/Discharge Planning: ?Goal: Ability to safely manage health-related needs after discharge will improve ?Outcome: Progressing ?  ?Problem: Health Behavior/Discharge Planning: ?Goal: Ability to manage health-related needs will improve ?Outcome: Progressing ?  ?Problem: Clinical Measurements: ?Goal: Ability to maintain clinical measurements within normal limits will improve ?Outcome: Progressing ?  ?Problem: Activity: ?Goal: Risk for activity intolerance will decrease ?Outcome: Progressing ?  ?Problem: Nutrition: ?Goal: Adequate nutrition will be maintained ?Outcome: Progressing ?  ?Problem: Elimination: ?Goal: Will not experience complications related to bowel motility ?Outcome: Progressing ?Goal: Will not experience complications related to urinary retention ?Outcome: Progressing ?  ?Problem: Coping: ?Goal: Level of anxiety will decrease ?Outcome: Progressing ?  ?Problem: Pain Managment: ?Goal: General experience of comfort will improve ?Outcome: Progressing ?  ?Problem: Safety: ?Goal: Ability to remain free from injury will improve ?Outcome: Progressing ?  ?Problem: Skin Integrity: ?Goal: Risk for impaired skin integrity will decrease ?Outcome: Progressing ?  ?

## 2022-03-14 NOTE — Progress Notes (Signed)
Speech Language Pathology Treatment:    ?Patient Details ?Name: Veronica Murray ?MRN: [id number] ?DOB: 1956-01-25 ?Today's Date: 03/14/2022 ?Time: 1045-1100 ?SLP Time Calculation (min) (ACUTE ONLY): 15 min ? ?Assessment / Plan / Recommendation ?Clinical Impression ? Pt seen for cognitive-linguistic tx. Pt received upright in recliner. Pt alert and cooperative. Flat affect noted. ? ?Pt continues with s/sx cognitive-linguistic deficits including impaired orientation/memory, attention (sustained), and problem solving. Throughout structured tx tasks, pt required frequent repetition/redirection. Pt easily distracted by environmental stimuli (e.g. noises, items in lap). Pt benefited from reduction in environmental stimuli to facilitate improved participation in skilled tx tasks. Pt oriented to self, place (with use of environmental aids), month (not date or year), and situation. Pt with emerging, but inconsistent use of environmental aids to improve orientation. Functional problem solving address. Pt provided logical solutions to routine hospital/household problems with 50% accuracy which improved to 60% with cueing. Pt unable to recall use of call bell or locate nurse icon on call bell initially. After ~10 minute delay, pt able to recall and demonstrate use of call bell. Pt continues with low volume/hoarse vocal quality - pt required mod verbal cues to speak louder while participating in skilled SLP services.  ? ?Based on today's assessment, anticipate need for frequent/constant supervisions, assistance with ADLs/iADLs, and post-acute SLP services. ?  ?SLP to f/u per POC for continued cognitive-linguistic tx and ongoing dysphagia management.  ? ?Pt and RN made aware of results, recommendations, and SLP POC. ?full understanding by pt. ?  ?HPI HPI: Per 15 H&P "Patient s/p LEFT CEA on 01/25/2022 at our facility secondary to TIA and high grade stenosis. The patient had been well, presented for follow up without issue.  Noted on 02/20/2022 she developed sudden swelling, pain and murky drainage from the incision. She was evaluated by urgent care on 02/22/2022 and it was felt she had a wound infection. She was treated with ABX. Last night the patient developed significant bleeding and hypotension to the 70s. Taken by EMS to [facility], digital pressure held, transfused pRBC. Neurologically intact. Initial [facility] by Forestine Na [facility] patient [facility] unstable, with active bleeding. That facility and Vascular surgeon did not accept the patient. I accepted the patient for emergent intervention. Upon arrival EMS was holding pressure at neck incision, the patient was awake, alert and mentating and aware of situation. She Understood significant risks of bleeding, stroke and death. Consent obtained.".     CXR on 03/03/2022: while still orally intubated, No acute cardiopulmonary disease identified.  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 Petechial hemorrhage  at this site. Local mass effect without significant ventricular  effacement. No midline shift.     Multiple smaller acute/early subacute cortical and subcortical  infarcts within the left frontoparietal lobes, posterior left  temporal lobe and lateral left occipital lobe (left MCA vascular  territory, as well as left MCA/ACA and left MCA/PCA watershed  territories). Petechial hemorrhage associated with some of these  infarcts. 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 ?  ?   ?SLP Plan ? Continue with current plan of care ? ?  ?  ?Recommendations for follow up therapy are one component of a multi-disciplinary discharge planning process, led by the attending physician.  Recommendations may be  updated based on patient status, additional functional criteria and insurance authorization. ?  ? ?Recommendations   ? ? ? ? Follow Up Recommendations: Skilled nursing-short term rehab (<3 hours/day) (consider HH if pt has 24 hour supervision/assistance) ?Assistance recommended at discharge: Frequent or constant Supervision/Assistance ?SLP Visit Diagnosis: Cognitive communication deficit (R41.841) ?Plan: Continue with current plan of care ? ? ? ? ?  ?  ? ?Cherrie Gauze, M.S., CCC-SLP ?Speech-Language Pathologist ?[facility] ?[phone] ([place]) ? ?Quintella Baton ? ?03/14/2022, 11:35 AM ?

## 2022-03-14 NOTE — Consult Note (Signed)
PHARMACY - TOTAL PARENTERAL NUTRITION CONSULT NOTE  ? ?Indication:  severe disphagia, not tolerating enteral feedings ? ?Patient Measurements: ?Height: 5\' 2"  (157.5 cm) ?Weight: 73.9 kg (162 lb 14.7 oz) ?IBW/kg (Calculated) : 50.1 ?TPN AdjBW (KG): 56.9 ?Body mass index is 29.8 kg/m?. ?Usual Weight: 82.9 kg on 03/04/22 ? ?Assessment: Pharmacy has been consulted to initiate TPN in 65yo patient admitted s/p left CEA on 01/25/22 secondary to TIA and high grade stenosis. Patient had been doing well, but started developing sudden swelling, pain and murky drainage from incision on 02/20/22. 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 Due to left carotid patch infection, patient remained intubated. On 4/20, underwent left carotid artery stent placement. Patient still having dysphagia and at high risk for aspiration. Has self-discontinued NG tube several times. IR has declined to place again d/t this.  ? ?Glucose / Insulin: BG 143 - 282 / SSI 0-20 units,  ?Last 24h: 41 units + glargine 20 units hs ? ?Electrolytes: electrolytes wnl ?Renal: SCR at baseline ?Hepatic: AST/ALT wnl ?Intake / Output; MIVF:   ?GI Imaging:  ?1. Distended urinary bladder to the umbilicus, suspicious for urinary retention. No bladder wall thickening. ?2. Small to moderate left and small right pleural effusions with associated compressive atelectasis. Mild generalized body wall edema ?in the flanks. ?3. Minimal wall thickening of the distal esophagus, can be seen with reflux or esophagitis. ? ?GI Surgeries / Procedures:  ?Plan for G-tube placement on 5/2 ? ?Central access: PICC placed 4/28 ?TPN start date: 4/28  ? ?Nutritional Goals: ?Goal TPN rate is 75 mL/hr (provides 106.2 g of protein and 1810.8 kcals per day) ? ?RD Assessment: ?Estimated Needs ?Total Energy Estimated Needs: 1800-2100kcal/day ?Total Protein Estimated Needs: 90-105g/day ?Total Fluid Estimated Needs: 1.5-1.8L/day ? ?Current Nutrition:   ?Dysphagia 1 diet ordered 4/29 ? ?Plan:  ?Continue reduced rate TPN at 40 mL/hr (total volume including overfill 1060 mL) ?Electrolytes in TPN: Na 25mEq/L, K 14mEq/L, Ca 22mEq/L, Mg 4mEq/L, and Phos 43mmol/L. Cl:Ac 1:1 ?Add standard MVI and trace elements to TPN ?Nutritional components ?Amino acids (using Travasol 10%): 56.6 grams ?Dextrose: 144 grams ?Lipids (using 20% SMOFlipids): 25 grams ?kCal: 966 ?continue Resistant q4h SSI and adjust as needed  ?Continue glargine 20 units in evening  ?Monitor TPN labs on Mon/Thurs, currently ordered as daily until electrolytes stabilize. ? ?Dallie Piles ?03/14/2022,6:58 AM ? ?

## 2022-03-14 NOTE — Progress Notes (Signed)
Speech Language Pathology Treatment: Dysphagia  ?Patient Details ?Name: Veronica Murray ?MRN: [id number] ?DOB: [birth year] ?Today's Date: 03/14/2022 ?Time: [id number] ?SLP Time Calculation (min) (ACUTE ONLY): 40 min ? ?Assessment / Plan / Recommendation ?Clinical Impression ? Pt seen for toleration of diet upgrade this afternoon. Per report, and pt description, she has tolerated the diet upgrade -- had shrimp dinner last night. No reports of difficulty swallowing at meals w/ the diet consistency upgrade. Pt was sitting in chair w/ tray in front of her but had not eaten much.  ?Reviewed labs, chart notes. Pt has been receiving TPN per chart notes w/ Dietician performing a calorie count d/t pt's reduced oral intake overall. Pt remains easily distracted by her environment -- this might be impacting her focus and concentration at meals.  ? ?To REDUCE distraction, prepared tray/area on table for meal by removing all items except pt's 2 food items(bowl of soup w/ chopped pot roast added to it, and peaches); 1 drink cup was on the tray at a time. Pt was sitting up in the chair and fed self. Min-Mod cues given for initiation of self-feeding tasks -- prepped the spoon intermittently for self-feeding. Pt was able to consume: ~90% of the peaches, ~8ozs of liquids including beginnings of an Ensure via cup w/ straw, ~60% of the soup. Visual and verbal cues appeared helpful for pt to complete the self-feeding task. She required verbal cues to remember to use finger and lingual sweeping on Left side mouth/buccal area. ?Pt appeared to adequately manage increased textured trials orally w/ Mild+ oral phase deficits c/b Left buccal and lingual residue. Pt was able to clear this residue using finger and lingual sweeps; f/u swallow. Noted anterior leakage x2 w/ vegetables as pt manipulated food in her mouth -- d/t Left oral weakness.  ?No overt clinical s/s of aspiration were noted w/ consistencies consumed including a mixed consistency  food. Clear vocal quality b/t trials noted.  ?  ?In order to support oral intake safely, recommend  continue a dys. Level 3 (mech soft) w/ moistened foods; thin liquids. Recommend supervision w/ po's for cues and follow through w/ swallowing strategies and aspiration precautions -- Left buccal/oral clearing. Family wanted to bring in foods of pt's liking; this was encouraged. NSG updated. ST services will f/u next 1-2 days for monitoring of status and education. Will also f/u w/ ongoing cog-linguistic tx which needs to be continued at [facility]. SW updated. Dtr(Summer) called on the phone and was updated on her progress w/ this meal; options of foods/prep of foods.  ? ? ?  ?HPI HPI: Per 2 H&P "Patient s/p LEFT CEA on 01/25/2022 at our facility secondary to TIA and high grade stenosis. The patient had been well, presented for follow up without issue. Noted on 02/20/2022 she developed sudden swelling, pain and murky drainage from the incision. She was evaluated by urgent care on 02/22/2022 and it was felt she had a wound infection. She was treated with ABX. Last night the patient developed significant bleeding and hypotension to the 70s. Taken by EMS to [facility], digital pressure held, transfused pRBC. Neurologically intact. [facility] by Forestine Na [facility], with active bleeding. That facility and Vascular surgeon did not accept the patient. I accepted the patient for emergent intervention. Upon arrival EMS was holding pressure at neck incision, the patient was awake, alert and mentating and aware of situation. She Understood significant  risks of bleeding, stroke and death. Consent obtained.".     CXR on 03/03/2022: while still orally intubated, No acute cardiopulmonary disease identified.  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 Petechial hemorrhage  at this site. Local mass effect without significant ventricular  effacement. No midline shift.     Multiple smaller acute/early subacute cortical and subcortical  infarcts within the left frontoparietal lobes, posterior left  temporal lobe and lateral left occipital lobe (left MCA vascular  territory, as well as left MCA/ACA and left MCA/PCA watershed  territories). Petechial hemorrhage associated with some of these  infarcts. 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 ?  ?   ?SLP Plan ? Continue with current plan of care ? ?  ?  ?Recommendations for follow up therapy are one component of a multi-disciplinary discharge planning process, led by the attending physician.  Recommendations may be updated based on patient status, additional functional criteria and insurance authorization. ?  ? ?Recommendations  ?Diet recommendations: Dysphagia 3 (mechanical soft);Thin liquid ?Liquids provided via: Cup;Straw ?Medication Administration: Whole meds with puree (vs need to Crush) ?Supervision: Patient able to self feed;Intermittent supervision to cue for compensatory strategies (verbal cues and encouragement to eat/drink) ?Compensations: Minimize environmental distractions;Slow rate;Small sips/bites;Lingual sweep for clearance of pocketing;Follow solids with liquid (Finger sweep in left buccal area, tongue) ?Postural Changes and/or Swallow Maneuvers: Out of bed for meals;Seated upright 90 degrees;Upright 30-60 min after meal  ?   ?    ?   ? ? ? ? General recommendations:  (Dietician following) ?Oral Care Recommendations: Oral care BID;Oral care before and after PO;Patient independent with oral care (support) ?Follow Up Recommendations: Skilled nursing-short term rehab (<3 hours/day) ?Assistance recommended at discharge: Intermittent Supervision/Assistance (at meals  for oral intake) ?SLP Visit Diagnosis: Dysphagia, oral phase (R13.11) ?Plan: Continue with current plan of care ? ? ? ? ?  ?  ? ? ? ? ?Orinda Kenner, MS, CCC-SLP ?Speech Language Pathologist ?Rehab Services; [place] ?[phone] (ascom) ?Taten Merrow ? ?03/14/2022, 3:38 PM ?

## 2022-03-14 NOTE — Progress Notes (Signed)
Patient has expressed to this nurses that she would like for nursing staff to continue communicating with her daughter Veronica Murray and not her husband. ?

## 2022-03-14 NOTE — Progress Notes (Signed)
Mobility Specialist - Progress Note ? ? ? 03/14/22 1642  ?Mobility  ?Activity Ambulated with assistance in hallway;Stood at bedside;Dangled on edge of bed  ?Level of Assistance Modified independent, requires aide device or extra time  ?Assistive Device Front wheel walker  ?Distance Ambulated (ft) 480 ft  ?Activity Response Tolerated well  ?$Mobility charge 1 Mobility  ? ? ? ?Pt supine upon arrival using RA. Pt completes bed mobility with MinA HHA and STS with MinA. Pt ambulates 3 laps with RW supervision + vc due to drifting left. Pt is emotional throughout stating "shes leaving tomorrow and just cant stay here anymore." Pt is left with needs in reach and chair alarm set. ? ?Clarisa Schools ?Mobility Specialist ?03/14/22, 4:48 PM ? ? ? ? ?

## 2022-03-14 NOTE — Progress Notes (Signed)
Occupational Therapy Treatment ?Patient Details ?Name: Veronica Murray ?MRN: [id number] ?DOB: [birth year] ?Today's Date: 03/14/2022 ? ? ?History of present illness 66 yo F transferred emergently to [facility] ED for emergent vascular intervention on a ruptured carotid endarterectomy wound. Intubated on 4/16, extubated on 4/24. 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 Petechial hemorrhage at this site."  Hospital course significant for L neck exploration with surture repair to L carotid (02/24/22) and placement of embolic protective device L carotid (02/28/22) ?  ?OT comments ? Ms Ringle was seen for OT treatment on this date. Upon arrival to room pt seated in chair, agreeable to tx. Pt requires CGA + rail for toilet t/f - assist for lines mgmt and multiple attempts to achieve standing from commode. MIN A clothing mgmt - pt walks ~10 ft with underwear around knees, unable to correct with cues only. MAX step by step cues for card sorting activity - attempted to sort by color and pt unable to achieve. Attempted to order one suit in numerical order and pt achieves with 1-3 cards only. Pt making good progress toward goals. Pt continues to benefit from skilled OT services to maximize return to PLOF and minimize risk of future falls, injury, caregiver burden, and readmission. Will continue to follow POC. Discharge recommendation remains appropriate.  ?  ? ?Recommendations for follow up therapy are one component of a multi-disciplinary discharge planning process, led by the attending physician.  Recommendations may be updated based on patient status, additional functional criteria and insurance authorization. ?   ?Follow Up Recommendations ? Skilled nursing-short term rehab (<3 hours/day)  ?  ?Assistance Recommended at Discharge Frequent or constant Supervision/Assistance  ?Patient can  return home with the following ? A little help with walking and/or transfers;A lot of help with bathing/dressing/bathroom ?  ?Equipment Recommendations ? BSC/3in1  ?  ?Recommendations for Other Services   ? ?  ?Precautions / Restrictions Precautions ?Precautions: Fall ?Restrictions ?Weight Bearing Restrictions: No  ? ? ?  ? ?Mobility Bed Mobility ?Overal bed mobility: Modified Independent, Needs Assistance ?  ?  ?  ?  ?  ?  ?  ?  ? ?Transfers ?Overall transfer level: Needs assistance ?Equipment used: None ?Transfers: Sit to/from Stand ?Sit to Stand: Min guard ?  ?  ?  ?  ?  ?  ?  ?  ?Balance Overall balance assessment: Needs assistance ?Sitting-balance support: No upper extremity supported, Feet supported ?Sitting balance-Leahy Scale: Good ?  ?  ?Standing balance support: No upper extremity supported, During functional activity ?Standing balance-Leahy Scale: Good ?  ?  ?  ?  ?  ?  ?  ?  ?  ?  ?  ?  ?   ? ?ADL either performed or assessed with clinical judgement  ? ?ADL Overall ADL's : Needs assistance/impaired ?  ?  ?  ?  ?  ?  ?  ?  ?  ?  ?  ?  ?  ?  ?  ?  ?  ?  ?  ?General ADL Comments: CGA + rail for toilet t/f - assist for lines mgmt and multiple attempts to achieve standing from commode. MIN A clothing mgmt - pt walks ~10 ft with underwear around knees, unable to correct with cues only. MAX step by step cues for card sorting activity ?  ? ? ?Cognition Arousal/Alertness: Awake/alert ?Behavior During Therapy: Flat affect ?  Overall Cognitive Status: Impaired/Different from baseline ?Area of Impairment: Attention, Memory, Following commands ?  ?  ?  ?  ?  ?  ?  ?  ?  ?Current Attention Level: Selective ?Memory: Decreased short-term memory ?Following Commands: Follows one step commands inconsistently, Follows one step commands with increased time ?  ?  ?Problem Solving: Slow processing, Difficulty sequencing ?  ?  ?  ?   ?Exercises Other Exercises ?Other Exercises: card sorting activity ? ?  ?   ?   ? ? ?Pertinent  Vitals/ Pain       Pain Assessment ?Pain Assessment: No/denies pain ? ? ?Frequency ? Min 4X/week  ? ? ? ? ?  ?Progress Toward Goals ? ?OT Goals(current goals can now be found in the care plan section) ? Progress towards OT goals: Progressing toward goals ? ?Acute Rehab OT Goals ?Patient Stated Goal: to go home ?OT Goal Formulation: With patient/family ?Time For Goal Achievement: 03/23/22 ?Potential to Achieve Goals: Good ?ADL Goals ?Pt Will Perform Grooming: sitting;with min assist ?Pt Will Transfer to Toilet: with mod assist;with +2 assist;bedside commode;stand pivot transfer ?Additional ADL Goal #1: Pt will engage in ADL task requiring MIN VC for sequencing with pt able to follow simple commands with increased time to complete, 5/5 opportunities.  ?Plan Discharge plan remains appropriate;Frequency remains appropriate   ? ?   ?AM-PAC OT "6 Clicks" Daily Activity     ?Outcome Measure ? ? Help from another person eating meals?: A Little ?Help from another person taking care of personal grooming?: A Little ?Help from another person toileting, which includes using toliet, bedpan, or urinal?: A Little ?Help from another person bathing (including washing, rinsing, drying)?: A Lot ?Help from another person to put on and taking off regular upper body clothing?: A Little ?Help from another person to put on and taking off regular lower body clothing?: A Lot ?6 Click Score: 16 ? ?  ?End of Session   ? ?OT Visit Diagnosis: Other abnormalities of gait and mobility (R26.89);Hemiplegia and hemiparesis;Other symptoms and signs involving cognitive function ?Hemiplegia - Right/Left: Left ?Hemiplegia - dominant/non-dominant: Non-Dominant ?Hemiplegia - caused by: Cerebral infarction ?  ?Activity Tolerance Patient tolerated treatment well ?  ?Patient Left in bed;with call bell/phone within reach;with bed alarm set ?  ?Nurse Communication   ?  ? ?   ? ?Time: 6659-9357 ?OT Time Calculation (min): 16 min ? ?Charges: OT General  Charges ?$OT Visit: 1 Visit ?OT Treatments ?$Self Care/Home Management : 8-22 mins ? ?Kathie Dike, M.S. OTR/L  ?03/14/22, 4:24 PM  ?ascom [phone] ? ?

## 2022-03-14 NOTE — Progress Notes (Signed)
Physical Therapy Treatment ?Patient Details ?Name: Veronica Murray ?MRN: [id number] ?DOB: Oct 14, 1956 ?Today's Date: 03/14/2022 ? ? ?History of Present Illness 66 yo F transferred emergently to [facility] ED for emergent vascular intervention on a ruptured carotid endarterectomy wound. Intubated on 4/16, extubated on 4/24. 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 Petechial hemorrhage at this site."  Hospital course significant for L neck exploration with surture repair to L carotid (02/24/22) and placement of embolic protective device L carotid (02/28/22) ? ?  ?PT Comments  ? ? Pt resting in recliner upon PT arrival; pt agreeable to PT session.  Pt oriented to person, place, general situation, and month/year but general confusion and impaired insight into impairments noted during session.  Pt tearful at times d/t being in hospital.  During session pt CGA ambulating 240 feet (no AD use) and CGA to SBA ambulating 100 feet with RW.  Pt mildly unsteady with intermittent altered stepping patterns requiring CGA for safety ambulating without UE support; improved balance and safety noted ambulating with RW. Limited sessions activities d/t pt fatigue.  Anticipate pt would be able to safely discharge home if she has 24/7 supervision/assist (d/t balance and cognitive concerns); if pt does not have that support available, pt would benefit from ongoing therapy at SNF. ?   ?Recommendations for follow up therapy are one component of a multi-disciplinary discharge planning process, led by the attending physician.  Recommendations may be updated based on patient status, additional functional criteria and insurance authorization. ? ?Follow Up Recommendations ? Acute inpatient rehab (3hours/day) ?  ?  ?Assistance Recommended at Discharge Frequent or constant Supervision/Assistance  ?Patient can  return home with the following A little help with walking and/or transfers;A little help with bathing/dressing/bathroom;Assistance with cooking/housework;Direct supervision/assist for financial management;Direct supervision/assist for medications management;Help with stairs or ramp for entrance;Assist for transportation ?  ?Equipment Recommendations ? Rolling walker (2 wheels)  ?  ?Recommendations for Other Services   ? ? ?  ?Precautions / Restrictions Precautions ?Precautions: Fall ?Restrictions ?Weight Bearing Restrictions: No  ?  ? ?Mobility ? Bed Mobility ?  ?  ?  ?  ?  ?  ?  ?General bed mobility comments: Deferred (pt in recliner beginning/end of session) ?  ? ?Transfers ?Overall transfer level: Needs assistance ?Equipment used: None, Rolling walker (2 wheels) ?Transfers: Sit to/from Stand ?Sit to Stand: Min guard ?  ?  ?  ?  ?  ?General transfer comment: mildly unsteady standing from recliner without UE support (CGA for safety) but improved steadiness noted using RW for transfers (vc's required for UE placement) ?  ? ?Ambulation/Gait ?Ambulation/Gait assistance: Min guard ?Gait Distance (Feet):  (240 feet no AD use; 100 feet with RW use) ?Assistive device: None, Rolling walker (2 wheels) ?  ?Gait velocity: decreased ?  ?  ?General Gait Details: mildly unsteady with intermittent altered stepping patterns requiring CGA for safety ambulating without UE support; CGA to SBA ambulating with RW use ? ? ?Stairs ?  ?  ?  ?  ?  ? ? ?Wheelchair Mobility ?  ? ?Modified Rankin (Stroke Patients Only) ?  ? ? ?  ?Balance Overall balance assessment: Needs assistance ?Sitting-balance support: No upper extremity supported, Feet supported ?Sitting balance-Leahy Scale: Good ?Sitting balance - Comments: steady sitting reaching within BOS ?  ?Standing balance support: No upper extremity supported, During functional activity ?Standing balance-Leahy Scale: Fair ?Standing balance  comment: mild unsteadiness with ambulation (no AD use)  requiring CGA for safety) ?  ?  ?  ?  ?  ?  ?  ?  ?  ?  ?  ?  ? ?  ?Cognition Arousal/Alertness: Awake/alert ?Behavior During Therapy: Flat affect (tearful at times (about wanting to go home)) ?Overall Cognitive Status: Impaired/Different from baseline ?Area of Impairment: Attention, Following commands, Safety/judgement, Awareness ?  ?  ?  ?  ?  ?  ?  ?  ?Orientation Level: Disoriented to, Time (Able to state month and year only) ?Current Attention Level: Divided ?Memory: Decreased short-term memory ?Following Commands: Follows one step commands inconsistently, Follows one step commands with increased time ?Safety/Judgement: Decreased awareness of deficits, Decreased awareness of safety ?Awareness: Intellectual ?Problem Solving: Slow processing, Difficulty sequencing, Requires verbal cues, Requires tactile cues, Decreased initiation ?  ?  ?  ? ?  ?Exercises   ? ?  ?General Comments  Nursing cleared pt for participation in physical therapy.  Pt agreeable to PT session. ?  ?  ? ?Pertinent Vitals/Pain Pain Assessment ?Pain Assessment: No/denies pain ?Pain Intervention(s): Limited activity within patient's tolerance, Monitored during session, Repositioned\ ?Vitals (HR and O2 on room air) stable and WFL throughout treatment session.  ? ? ?Home Living   ?  ?  ?  ?  ?  ?  ?  ?  ?  ?   ?  ?Prior Function    ?  ?  ?   ? ?PT Goals (current goals can now be found in the care plan section) Acute Rehab PT Goals ?Patient Stated Goal: get better ?PT Goal Formulation: With patient ?Time For Goal Achievement: 03/18/22 ?Potential to Achieve Goals: Good ?Progress towards PT goals: Progressing toward goals ? ?  ?Frequency ? ? ? 7X/week ? ? ? ?  ?PT Plan Current plan remains appropriate  ? ? ?Co-evaluation   ?  ?  ?  ?  ? ?  ?AM-PAC PT "6 Clicks" Mobility   ?Outcome Measure ? Help needed turning from your back to your side while in a flat bed without using bedrails?: None ?Help needed moving from lying on your back to sitting on the  side of a flat bed without using bedrails?: A Little ?Help needed moving to and from a bed to a chair (including a wheelchair)?: A Little ?Help needed standing up from a chair using your arms (e.g., wheelchair or bedside chair)?: A Little ?Help needed to walk in hospital room?: A Little ?Help needed climbing 3-5 steps with a railing? : A Little ?6 Click Score: 19 ? ?  ?End of Session Equipment Utilized During Treatment: Gait belt ?Activity Tolerance: Patient tolerated treatment well ?Patient left: in chair;with call bell/phone within reach;with chair alarm set;Other (comment) (fall mat in place) ?Nurse Communication: Mobility status;Precautions ?PT Visit Diagnosis: Muscle weakness (generalized) (M62.81);Difficulty in walking, not elsewhere classified (R26.2);Hemiplegia and hemiparesis;Unsteadiness on feet (R26.81) ?Hemiplegia - Right/Left: Left ?Hemiplegia - dominant/non-dominant: Non-dominant ?Hemiplegia - caused by: Cerebral infarction ?  ? ? ?Time: 8719-5974 ?PT Time Calculation (min) (ACUTE ONLY): 28 min ? ?Charges:  $Gait Training: 23-37 mins          ?          ?Hendricks Limes, PT ?03/14/22, 11:37 AM ? ? ?

## 2022-03-15 DIAGNOSIS — Z9889 Other specified postprocedural states: Secondary | ICD-10-CM | POA: Diagnosis not present

## 2022-03-15 LAB — MAGNESIUM: Magnesium: 2 mg/dL (ref 1.7–2.4)

## 2022-03-15 LAB — BASIC METABOLIC PANEL
Anion gap: 9 (ref 5–15)
BUN: 14 mg/dL (ref 8–23)
CO2: 28 mmol/L (ref 22–32)
Calcium: 8.8 mg/dL — ABNORMAL LOW (ref 8.9–10.3)
Chloride: 100 mmol/L (ref 98–111)
Creatinine, Ser: 0.44 mg/dL (ref 0.44–1.00)
GFR, Estimated: >60 mL/min (ref >60)
Glucose, Bld: 148 mg/dL — ABNORMAL HIGH (ref 70–99)
Potassium: 3.7 mmol/L (ref 3.5–5.1)
Sodium: 137 mmol/L (ref 135–145)

## 2022-03-15 LAB — GLUCOSE, CAPILLARY
Glucose-Capillary: 155 mg/dL — ABNORMAL HIGH (ref 70–99)
Glucose-Capillary: 162 mg/dL — ABNORMAL HIGH (ref 70–99)
Glucose-Capillary: 168 mg/dL — ABNORMAL HIGH (ref 70–99)
Glucose-Capillary: 229 mg/dL — ABNORMAL HIGH (ref 70–99)
Glucose-Capillary: 273 mg/dL — ABNORMAL HIGH (ref 70–99)

## 2022-03-15 MED ORDER — INSULIN GLARGINE-YFGN 100 UNIT/ML ~~LOC~~ SOLN
23.0000 [IU] | Freq: Every day | SUBCUTANEOUS | Status: DC
  Filled 2022-03-15: qty 0.23

## 2022-03-15 MED ORDER — ENSURE ENLIVE PO LIQD
237.0000 mL | Freq: Two times a day (BID) | ORAL | 12 refills | Status: DC
Start: 2022-03-16 — End: 2022-06-04

## 2022-03-15 MED ORDER — SODIUM CHLORIDE 0.9 % IV SOLN
2.0000 g | INTRAVENOUS | Status: DC
  Administered 2022-03-15: 2 g via INTRAVENOUS
  Filled 2022-03-15: qty 2

## 2022-03-15 MED ORDER — NICOTINE 21 MG/24HR TD PT24: 21.0000 mg | MEDICATED_PATCH | Freq: Every day | TRANSDERMAL | 0 refills | Status: DC

## 2022-03-15 MED ORDER — ADULT MULTIVITAMIN W/MINERALS CH
1.0000 | ORAL_TABLET | Freq: Every day | ORAL | Status: DC
Start: 2022-03-16 — End: 2023-01-22

## 2022-03-15 MED ORDER — HYDROXYZINE HCL 10 MG PO TABS: 10.0000 mg | ORAL_TABLET | Freq: Three times a day (TID) | ORAL | 0 refills | Status: DC | PRN

## 2022-03-15 MED ORDER — CLOPIDOGREL BISULFATE 75 MG PO TABS: 75.0000 mg | ORAL_TABLET | Freq: Every day | ORAL | Status: DC

## 2022-03-15 MED ORDER — TRAVASOL 10 % IV SOLN
INTRAVENOUS | Status: DC
  Filled 2022-03-15: qty 566.4

## 2022-03-15 MED ORDER — PANTOPRAZOLE SODIUM 40 MG PO PACK: 40.0000 mg | PACK | Freq: Every day | ORAL | Status: DC

## 2022-03-15 MED ORDER — CEFTRIAXONE IV (FOR PTA / DISCHARGE USE ONLY): 2.0000 g | INTRAVENOUS | 0 refills | Status: DC

## 2022-03-15 MED ORDER — ASPIRIN 81 MG PO CHEW: 81.0000 mg | CHEWABLE_TABLET | Freq: Every day | ORAL | Status: DC

## 2022-03-15 NOTE — TOC Progression Note (Addendum)
Transition of Care (TOC) - Progression Note  ? ? ?Patient Details  ?Name: Veronica Murray ?MRN: [id number] ?Date of Birth: 1956/03/16 ? ?Transition of Care (TOC) CM/SW Contact  ?Candie Chroman, LCSW ?Phone Number: ?03/15/2022, 8:21 AM ? ?Clinical Narrative: Insurance authorization still pending.   ? ?8:52 am: SNF will need daughter to complete admissions paperwork prior to her admitting to the facility. They tried calling her but no call back yet. CSW left her a voicemail.  ? ?9:16 am: Daughter is going to facility at 1:30 to complete admissions paperwork. ? ?9:53 am: Saw patient walking in the hallway. She asked for update on [facility]. Told her Josem Kaufmann is still pending and daughter is doing paperwork at 1:30. ? ?2:15 pm: Auth approved: [id number]. Valid 5/5-5/9. Left message for SNF admissions coordinator to notify. Sent secure chat to MD. Patient is aware. Asked her if she had told her husband yet but she said he would be the last person she told. ? ?Expected Discharge Plan: [place] ?Barriers to Discharge: Continued Medical Work up ? ?Expected Discharge Plan and Services ?Expected Discharge Plan: [place] ?  ?Discharge Planning Services: CM Consult ?Post Acute Care Choice: [place] ?Living arrangements for the past 2 months: Hotel/Motel ?                ?DME Arranged: N/A ?DME Agency: NA ?  ?  ?  ?  ?  ?  ?  ?  ? ? ?Social Determinants of Health (SDOH) Interventions ?  ? ?Readmission Risk Interventions ?   ? View : No data to display.  ?  ?  ?  ? ? ?

## 2022-03-15 NOTE — Discharge Summary (Signed)
?Physician Discharge Summary ?  ?Patient: Veronica Murray MRN: [id number] DOB: [birth year]  ?Admit date:     02/24/2022  ?Discharge date: 03/15/22  ?Discharge Physician: Lorella Nimrod  ? ?PCP: Patient, No Pcp Per (Inactive)  ? ?Recommendations at discharge:  ?Please obtain CBC, BMP, magnesium and phosphorous levels within a week. ?Follow-up with neurology ?Follow-up with primary care provider ?Follow-up with vascular surgery ? ?Discharge Diagnoses: ?Principal Problem: ?  Post-operative state ?Active Problems: ?  Rupture of carotid artery ([place]) ?  CVA (cerebral vascular accident) [facility]) ?  DM2 (diabetes mellitus, type 2) ([place]) ?  Hypokalemia ?  Dysphagia following cerebral infarction ?  Normocytic anemia ?  Leukocytosis ?  Thrombocytosis ?  Hyponatremia ?  Hip pain ? ? ?Hospital Course: ?History of Present Illness: Patient s/p LEFT CEA on 01/25/2022 at our facility secondary to TIA and high grade stenosis. The patient had been well, presented for follow up without issue. Noted on 02/20/2022 she developed sudden swelling, pain and murky drainage from the incision. She was evaluated by urgent care on 02/22/2022 and it was felt she had a wound infection. She was treated with ABX. Last night the patient developed significant bleeding and hypotension to the 70s. Taken by EMS to [facility], digital pressure held, transfused pRBC. Neurologically intact. Initial [facility] by Forestine Na [facility] was unstable, with active bleeding. That facility and Vascular surgeon did not accept the patient. I accepted the patient for emergent intervention. Upon arrival EMS was holding pressure at neck incision, the patient was awake, alert and mentating and aware of situation. She Understood significant risks of bleeding, stroke and death. Consent obtained. ?  ?As per Dr. Mortimer Fries: ?02/25/22: Admit to ICU s/p left carotid artery repair in the setting of endarterectomy  carotid rupture due to left carotid patch infection remained mechanically intubated postop ?02/28/22: Pt underwent left carotid artery stent placement  ?02/28/22: Will perform SBT with plans for possible extubation.  Pt unable to follow commands during WUA CT Head revealed subacute right MCA stroke as well as recent but older-appearing left MCA strokes x2.  Neurology consulted.  Unable to extubate  ?4/23 MRI brain: Moderate-to-large acute/early subacute right MCA territory infarct within the cortical/subcortical right frontal lobe as well as right insula and subinsular region. Petechial hemorrhage at this site. Local mass effect without significant ventricular ?4/23 NEUROLOGY CONSULTED. ? ?Patient slowly improved, no significant neurologic deficit except mild behavioral issues due to involvement of frontal lobe.  Patient received TPN for many days due to inability to take p.o.  She is slowly increased her p.o. intake and TPN was weaned off. ?We did discuss the possibility of getting G-tube for nutritional support patient declined. ?She seems very motivated to increase her p.o. intake and get better, stating that her daughter is getting married in September and she wants to be there for her. ? ?Her wound from ruptured left carotid healed very well.  No sign of infection or rebleeding.  She will continue aspirin and Plavix. ? ?Patient also found to have strep constellatus infection.  ID was following and they are recommending IV antibiotics for 6 weeks, she received Unasyn while in the hospital and discharged on ceftriaxone for the once daily dose, she is being discharged with PICC line and will continue with ceftriaxone until 04/07/2022. ?She need to follow-up with infectious disease and PICC line to be removed after finishing antibiotics. ? ?Patient was also found  to have dysphagia, she was high risk for aspiration.  Initially NG tube was placed but she discontinued several times and declined G-tube.  She did received  TPN for many days and now able to start p.o.  Please encourage nutritional supplements in between meals. ? ?Patient remained stable.  Our PT and OT initially recommended CIR but due to having not much family support after the discharge, family decided to go to rehab to regain the strength back before returning home. ? ?She will continue on current medications and will follow-up with his providers. ? ?  ? ? ?Consultants: PCCM, neurology, infectious disease, vascular surgery ?Procedures performed: Left carotid artery repair.  Mechanical ventilation.  PICC line ?Disposition: Skilled nursing facility ?Diet recommendation:  ?Discharge Diet Orders (From admission, onward)  ? ?  Start     Ordered  ? 03/15/22 0000  Diet - low sodium heart healthy       ? 03/15/22 1425  ? ?  ?  ? ?  ? ?Cardiac and Carb modified diet ?DISCHARGE MEDICATION: ?Allergies as of 03/15/2022   ? ?   Reactions  ? Codeine Hives, Shortness Of Breath, Nausea Only  ? Toradol [ketorolac Tromethamine] Hives, Shortness Of Breath, Nausea Only  ? Tramadol Hcl Shortness Of Breath, Nausea Only  ? "Conflicts with bipolar condition"  ? ?  ? ?  ?Medication List  ?  ? ?STOP taking these medications   ? ?apixaban 5 MG Tabs tablet ?Commonly known as: Eliquis ?  ?BIOFREEZE EX ?  ?cephALEXin 500 MG capsule ?Commonly known as: KEFLEX ?  ?Ibuprofen 200 MG Caps ?  ?oxyCODONE-acetaminophen 5-325 MG tablet ?Commonly known as: PERCOCET/ROXICET ?  ? ?  ? ?TAKE these medications   ? ?aspirin 81 MG EC tablet ?Take 1 tablet (81 mg total) by mouth daily. Swallow whole. ?What changed: Another medication with the same name was removed. Continue taking this medication, and follow the directions you see here. ?  ?cefTRIAXone  IVPB ?Commonly known as: ROCEPHIN ?Inject 2 g into the vein daily for 25 days. Indication: S constellatus endarterectomy graft infection ?Last Day of Therapy:  04/07/2022 ?Labs - Once weekly:  CBC/D and CMP ?  ?clopidogrel 75 MG tablet ?Commonly known as:  PLAVIX ?Take 1 tablet (75 mg total) by mouth daily. ?Start taking on: Mar 16, 2022 ?  ?feeding supplement Liqd ?Take 237 mLs by mouth 2 (two) times daily between meals. ?Start taking on: Mar 16, 2022 ?  ?gabapentin 300 MG capsule ?Commonly known as: NEURONTIN ?Take 300 mg by mouth 3 (three) times daily. ?  ?hydrOXYzine 10 MG tablet ?Commonly known as: ATARAX ?Take 1 tablet (10 mg total) by mouth 3 (three) times daily as needed for anxiety or itching. ?  ?Insulin Pen Needle 30G X 8 MM Misc ?Commonly known as: NOVOFINE ?Inject 10 each into the skin as needed. ?  ?Lantus 100 UNIT/ML injection ?Generic drug: insulin glargine ?Inject 20 Units into the skin 2 (two) times daily. ?  ?multivitamin with minerals Tabs tablet ?Take 1 tablet by mouth daily. ?Start taking on: Mar 16, 2022 ?  ?nicotine 21 mg/24hr patch ?Commonly known as: NICODERM CQ - dosed in mg/24 hours ?Place 1 patch (21 mg total) onto the skin daily. ?Start taking on: Mar 16, 2022 ?  ?pantoprazole sodium 40 mg ?Commonly known as: PROTONIX ?Take 40 mg by mouth daily. ?  ?rosuvastatin 10 MG tablet ?Commonly known as: Crestor ?Take 1 tablet (10 mg total) by mouth daily. ?  ? ?  ? ?  ?  ? ? ?  ?  Home Infusion Instuctions  ?(From admission, onward)  ?  ? ? ?  ? ?  Start     Ordered  ? 03/15/22 0000  Home infusion instructions       ?Question:  Instructions  Answer:  Flushing of vascular access device: 0.9% NaCl pre/post medication administration and prn patency; Heparin 100 u/ml, 23ml for implanted ports and Heparin 10u/ml, 61ml for all other central venous catheters.  ? 03/15/22 1425  ? ?  ?  ? ?  ? ? ?  ?Discharge Care Instructions  ?(From admission, onward)  ?  ? ? ?  ? ?  Start     Ordered  ? 03/15/22 0000  No dressing needed       ? 03/15/22 1425  ? ?  ?  ? ?  ? ? Contact information for after-discharge care   ? ? Destination   ? ? [facility] Preferred SNF .   ?Service: Skilled Nursing ?Contact information: ?[place] ?[place] ?[phone] ? ?  ?  ? ?  ?  ? ?  ?  ? ?  ? ?Discharge Exam: ?Filed Weights  ? 03/09/22 0416 03/11/22 0500 03/14/22 0419  ?Weight: 74.7 kg 75.3 kg 73.9 kg  ? ?General.     In no acute distress. ?Pulm

## 2022-03-15 NOTE — Progress Notes (Signed)
RN attempted x2 to call report to [facility]. Call back number left with on voicemail.  ?

## 2022-03-15 NOTE — Consult Note (Signed)
PHARMACY - TOTAL PARENTERAL NUTRITION CONSULT NOTE  ? ?Indication:  severe disphagia, not tolerating enteral feedings ? ?Patient Measurements: ?Height: 5\' 2"  (157.5 cm) ?Weight: 73.9 kg (162 lb 14.7 oz) ?IBW/kg (Calculated) : 50.1 ?TPN AdjBW (KG): 56.9 ?Body mass index is 29.8 kg/m?. ?Usual Weight: 82.9 kg on 03/04/22 ? ?Assessment: Pharmacy has been consulted to initiate TPN in 66yo patient admitted s/p left CEA on 01/25/22 secondary to TIA and high grade stenosis. Patient had been doing well, but started developing sudden swelling, pain and murky drainage from incision on 02/20/22. 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 Due to left carotid patch infection, patient remained intubated. On 4/20, underwent left carotid artery stent placement. Patient still having dysphagia and at high risk for aspiration. Has self-discontinued NG tube several times. IR has declined to place again d/t this.  ? ?Glucose / Insulin: BG 148-224/ SSI 0-20 units,  ?Last 24h:  SSI:33 units , + glargine 20 units hs ? ?Electrolytes: electrolytes wnl ?Renal: SCR at baseline ?Hepatic: AST/ALT wnl ?Intake / Output; MIVF:   ?GI Imaging:  ?1. Distended urinary bladder to the umbilicus, suspicious for urinary retention. No bladder wall thickening. ?2. Small to moderate left and small right pleural effusions with associated compressive atelectasis. Mild generalized body wall edema ?in the flanks. ?3. Minimal wall thickening of the distal esophagus, can be seen with reflux or esophagitis. ? ?GI Surgeries / Procedures:  ? ? ?Central access: PICC placed 4/28 ?TPN start date: 4/28  ? ?Nutritional Goals: ?Goal TPN rate is 75 mL/hr (provides 106.2 g of protein and 1810.8 kcals per day) ? ?RD Assessment: ?Estimated Needs ?Total Energy Estimated Needs: 1800-2100kcal/day ?Total Protein Estimated Needs: 90-105g/day ?Total Fluid Estimated Needs: 1.5-1.8L/day ? ?Current Nutrition:  ?Dysphagia 1 diet ordered  4/29 ?Dysphagia 3 on 5/3 ? ?Plan:  ?Continue reduced rate TPN at 40 mL/hr (total volume including overfill 1060 mL) ?Electrolytes in TPN: Na 39mEq/L, K 20mEq/L, Ca 29mEq/L, Mg 73mEq/L, and Phos 45mmol/L. Cl:Ac 1:1 ?Add standard MVI and trace elements to TPN ?Nutritional components ?Amino acids (using Travasol 10%): 56.6 grams ?Dextrose: 144 grams ?Lipids (using 20% SMOFlipids): 25 grams ?kCal: 966 ?continue Resistant q4h SSI and adjust as needed  ?Increase glargine to 23 units in evening  ?Monitor TPN labs on Mon/Thurs, currently ordered as daily until electrolytes stabilize. ? ?Veronica Murray ?03/15/2022,9:52 AM ? ?

## 2022-03-15 NOTE — Progress Notes (Signed)
Mobility Specialist - Progress Note ? ? ? 03/15/22 1000  ?Mobility  ?Activity Ambulated with assistance in hallway;Stood at bedside;Dangled on edge of bed;Transferred from bed to chair  ?Level of Assistance Standby assist, set-up cues, supervision of patient - no hands on  ?Assistive Device Front wheel walker  ?Distance Ambulated (ft) 350 ft  ?Activity Response Tolerated well  ?$Mobility charge 1 Mobility  ? ? ? ?Pt supine upon arrival using RA. Pt completes bed mobility with MinA HHA to sit EOB, and STS ModI + extra time. Ambulates 2 laps with supervision + vc to prevent drifting. Pt returns to chair with alarm set and needs in reach. ? ?Clarisa Schools ?Mobility Specialist ?03/15/22, 10:10 AM ? ? ? ? ?

## 2022-03-15 NOTE — Care Management Important Message (Signed)
Important Message ? ?Patient Details  ?Name: Veronica Murray ?MRN: [id number] ?Date of Birth: [birth year] ? ? ?Medicare Important Message Given:  Yes ? ? ? ? ?Johnell Comings ?03/15/2022, 11:03 AM ?

## 2022-03-15 NOTE — TOC Transition Note (Signed)
Transition of Care (TOC) - CM/SW Discharge Note ? ? ?Patient Details  ?Name: Veronica Murray ?MRN: [id number] ?Date of Birth: 11-14-1955 ? ?Transition of Care (TOC) CM/SW Contact:  ?Margarito Liner, LCSW ?Phone Number: ?03/15/2022, 2:41 PM ? ? ?Clinical Narrative:  Patient has orders to discharge to [facility] today. RN will call report to [phone] (Room [phone]). Daughter will transport her to the facility. Patient is very excited to be discharging to the facility today in order to continue therapy, get her IV antibiotics, and get well enough to discharge home. No further concerns. CSW signing off.   ? ?Final next level of care: Skilled Nursing Facility ?Barriers to Discharge: Barriers Resolved ? ? ?Patient Goals and CMS Choice ?Patient states their goals for this hospitalization and ongoing recovery are:: patient is unable to voice goals ?CMS Medicare.gov Compare Post Acute Care list provided to:: Patient ?Choice offered to / list presented to : Patient, Adult Children ? ?Discharge Placement ?PASRR number recieved: 03/06/22 ?           ?Patient [facility] bed at: [facility] ?Patient to be transferred to facility by: Daughter ?Name of family member notified: Rutherford Nail ?Patient and family notified of of transfer: 03/15/22 ? ?Discharge Plan and Services ?  ?Discharge Planning Services: CM Consult ?Post Acute Care Choice: Skilled Nursing Facility          ?DME Arranged: N/A ?DME Agency: NA ?  ?  ?  ?  ?  ?  ?  ?  ? ?Social Determinants of Health (SDOH) Interventions ?  ? ? ?Readmission Risk Interventions ?   ? View : No data to display.  ?  ?  ?  ? ? ? ? ? ?

## 2022-03-15 NOTE — Progress Notes (Signed)
Nutrition Follow Up Note  ? ?DOCUMENTATION CODES:  ? ?Obesity unspecified ? ?INTERVENTION:  ? ?Discontinue TPN  ? ?Ensure Enlive po BID, each supplement provides 350 kcal and 20 grams of protein. ? ?Vital Cuisine TID, each supplement provides 520kcal and 22g of protein.  ? ?MVI po daily  ? ?NUTRITION DIAGNOSIS:  ? ?Inadequate oral intake related to inability to eat (pt sedated and ventilated) as evidenced by NPO status. ?-pt advanced to a dysphagia 2 diet but continues to have poor oral intake ? ?GOAL:  ? ?Patient will meet greater than or equal to 90% of their needs ?-progressing  ? ?MONITOR:  ? ?PO intake, Supplement acceptance, Diet advancement, Labs, Weight trends, Skin, I & O's, Other (Comment) (TPN) ? ?ASSESSMENT:  ? 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 ? ?CT scan from 4/21 reports new MCA infarcts  ? ?Forty eight hour calorie count completed. Pt meeting 30-50% of her estimated needs via oral intake. Pt is eating better foods brought from outside the hospital. Pt is refusing most supplements. G-tube discussed with pt by MD; pt has decided that she does not want a feeding tube. Pt reports that she is motivated to try and eat more. Plan is to discontinue TPN today. RD will add additional supplements to meal trays. Per chart, pt is down 8lbs(4%) since admission. Insurance authorization pending for SNF.  ? ?Medications reviewed and include: aspirin, plavix, lovenox, insulin, reglan, nicotine, protonix, ceftriaxone  ? ?Labs reviewed: K 3.7 wnl, Mg 2.0 wnl ?P 4.5 wnl- 5/4 ?Hgb 9.2(L), Hct 28.0(L) ?Cbgs- 229, 162, 168, 155 x 24 hrs ? ?Diet Order:   ?Diet Order   ? ?       ?  DIET DYS 3 Room  service appropriate? Yes with Assist; Fluid consistency: Thin  Diet effective now       ?  ? ?  ?  ? ?  ? ?EDUCATION NEEDS:  ? ?Education needs have been addressed ? ?Skin:  Skin Assessment: Skin Integrity Issues: ?Skin Integrity Issues:: Incisions ?Incisions: closed lt neck ? ?Last BM:  5/4 ? ?Height:  ? ?Ht Readings from Last 1 Encounters:  ?02/25/22 5\' 2"  (1.575 m)  ? ? ?Weight:  ? ?Wt Readings from Last 1 Encounters:  ?03/14/22 73.9 kg  ? ? ?Ideal Body Weight:  50 kg ? ?BMI:  Body mass index is 29.8 kg/m?. ? ?Estimated Nutritional Needs:  ? ?Kcal:  1800-2100kcal/day ? ?Protein:  90-105g/day ? ?Fluid:  1.5-1.8L/day ? ?Koleen Distance MS, RD, LDN ?Please refer to [facility] for RD and/or RD on-call/weekend/after hours pager ? ? ? ?

## 2022-03-29 ENCOUNTER — Telehealth (INDEPENDENT_AMBULATORY_CARE_PROVIDER_SITE_OTHER): Payer: Self-pay | Admitting: Vascular Surgery

## 2022-03-29 NOTE — Telephone Encounter (Signed)
Carotid duplex and should see JD

## 2022-03-29 NOTE — Telephone Encounter (Signed)
Spoke with Bjorn Loser from [facility] - she states that the patient needs a hospital f/u but I cna't see what exactly we need. All I see is f/u with vascular surgery. Please advise if we need any studies. Thanks.

## 2022-04-02 ENCOUNTER — Other Ambulatory Visit
Admission: RE | Admit: 2022-04-02 | Discharge: 2022-04-02 | Disposition: A | Discharge status: Home / Self Care | Payer: Medicare HMO | Attending: Infectious Diseases | Admitting: Infectious Diseases

## 2022-04-02 ENCOUNTER — Encounter: Payer: Self-pay | Admitting: Infectious Diseases

## 2022-04-02 ENCOUNTER — Ambulatory Visit: Payer: Medicare HMO | Attending: Infectious Diseases | Admitting: Infectious Diseases

## 2022-04-02 VITALS — BP 130/85 | HR 82 | Temp 97.0°F | Ht 62.0 in | Wt 162.0 lb

## 2022-04-02 DIAGNOSIS — G453 Amaurosis fugax: Secondary | ICD-10-CM | POA: Diagnosis not present

## 2022-04-02 DIAGNOSIS — Y832 Surgical operation with anastomosis, bypass or graft as the cause of abnormal reaction of the patient, or of later complication, without mention of misadventure at the time of the procedure: Secondary | ICD-10-CM | POA: Insufficient documentation

## 2022-04-02 DIAGNOSIS — I10 Essential (primary) hypertension: Secondary | ICD-10-CM | POA: Diagnosis not present

## 2022-04-02 DIAGNOSIS — Z792 Long term (current) use of antibiotics: Secondary | ICD-10-CM | POA: Diagnosis not present

## 2022-04-02 DIAGNOSIS — Z7982 Long term (current) use of aspirin: Secondary | ICD-10-CM | POA: Insufficient documentation

## 2022-04-02 DIAGNOSIS — F17210 Nicotine dependence, cigarettes, uncomplicated: Secondary | ICD-10-CM | POA: Insufficient documentation

## 2022-04-02 DIAGNOSIS — Z794 Long term (current) use of insulin: Secondary | ICD-10-CM | POA: Diagnosis not present

## 2022-04-02 DIAGNOSIS — E119 Type 2 diabetes mellitus without complications: Secondary | ICD-10-CM | POA: Insufficient documentation

## 2022-04-02 DIAGNOSIS — I700 Atherosclerosis of aorta: Secondary | ICD-10-CM | POA: Insufficient documentation

## 2022-04-02 DIAGNOSIS — Z9889 Other specified postprocedural states: Secondary | ICD-10-CM | POA: Insufficient documentation

## 2022-04-02 DIAGNOSIS — D649 Anemia, unspecified: Secondary | ICD-10-CM | POA: Insufficient documentation

## 2022-04-02 DIAGNOSIS — I639 Cerebral infarction, unspecified: Secondary | ICD-10-CM | POA: Insufficient documentation

## 2022-04-02 DIAGNOSIS — Z95828 Presence of other vascular implants and grafts: Secondary | ICD-10-CM | POA: Insufficient documentation

## 2022-04-02 DIAGNOSIS — T827XXA Infection and inflammatory reaction due to other cardiac and vascular devices, implants and grafts, initial encounter: Secondary | ICD-10-CM | POA: Diagnosis not present

## 2022-04-02 DIAGNOSIS — Z7902 Long term (current) use of antithrombotics/antiplatelets: Secondary | ICD-10-CM | POA: Insufficient documentation

## 2022-04-02 DIAGNOSIS — B954 Other streptococcus as the cause of diseases classified elsewhere: Secondary | ICD-10-CM | POA: Diagnosis not present

## 2022-04-02 DIAGNOSIS — T827XXD Infection and inflammatory reaction due to other cardiac and vascular devices, implants and grafts, subsequent encounter: Secondary | ICD-10-CM | POA: Diagnosis not present

## 2022-04-02 LAB — COMPREHENSIVE METABOLIC PANEL
ALT: 18 U/L (ref 0–44)
AST: 27 U/L (ref 15–41)
Albumin: 3.5 g/dL (ref 3.5–5.0)
Alkaline Phosphatase: 129 U/L — ABNORMAL HIGH (ref 38–126)
Anion gap: 11 (ref 5–15)
BUN: 12 mg/dL (ref 8–23)
CO2: 27 mmol/L (ref 22–32)
Calcium: 9.1 mg/dL (ref 8.9–10.3)
Chloride: 101 mmol/L (ref 98–111)
Creatinine, Ser: 0.78 mg/dL (ref 0.44–1.00)
GFR, Estimated: >60 mL/min (ref >60)
Glucose, Bld: 158 mg/dL — ABNORMAL HIGH (ref 70–99)
Potassium: 4.3 mmol/L (ref 3.5–5.1)
Sodium: 139 mmol/L (ref 135–145)
Total Bilirubin: 0.4 mg/dL (ref 0.3–1.2)
Total Protein: 7.5 g/dL (ref 6.5–8.1)

## 2022-04-02 LAB — CBC WITH DIFFERENTIAL/PLATELET
Abs Immature Granulocytes: 0.01 10*3/uL (ref 0.00–0.07)
Basophils Absolute: 0 10*3/uL (ref 0.0–0.1)
Basophils Relative: 1 %
Eosinophils Absolute: 0.2 10*3/uL (ref 0.0–0.5)
Eosinophils Relative: 4 %
HCT: 33 % — ABNORMAL LOW (ref 36.0–46.0)
Hemoglobin: 11 g/dL — ABNORMAL LOW (ref 12.0–15.0)
Immature Granulocytes: 0 %
Lymphocytes Relative: 36 %
Lymphs Abs: 2.2 10*3/uL (ref 0.7–4.0)
MCH: 30.1 pg (ref 26.0–34.0)
MCHC: 33.3 g/dL (ref 30.0–36.0)
MCV: 90.2 fL (ref 80.0–100.0)
Monocytes Absolute: 0.4 10*3/uL (ref 0.1–1.0)
Monocytes Relative: 6 %
Neutro Abs: 3.3 10*3/uL (ref 1.7–7.7)
Neutrophils Relative %: 53 %
Platelets: 315 10*3/uL (ref 150–400)
RBC: 3.66 MIL/uL — ABNORMAL LOW (ref 3.87–5.11)
RDW: 13.6 % (ref 11.5–15.5)
WBC: 6.1 10*3/uL (ref 4.0–10.5)
nRBC: 0 % (ref 0.0–0.2)

## 2022-04-02 LAB — C-REACTIVE PROTEIN: CRP: 0.7 mg/dL (ref <1.0)

## 2022-04-02 LAB — SEDIMENTATION RATE: Sed Rate: 67 mm/hr — ABNORMAL HIGH (ref 0–30)

## 2022-04-02 MED ORDER — AMOXICILLIN-POT CLAVULANATE 875-125 MG PO TABS: 1.0000 | ORAL_TABLET | Freq: Two times a day (BID) | ORAL | 1 refills | Status: DC

## 2022-04-02 NOTE — Progress Notes (Signed)
NAME: Veronica Murray  DOB: 10/09/1956  MRN: [id number]  Date/Time: 04/02/2022 11:04 AM   Subjective:   ?follow up aftr recent hospitalization- is currently in SNF getting IV ceftriaxone  Veronica Murray is a 66 y.o. with a history of diabetes mellitus, aortic arch atherosclerosis, hypertension, tobacco use, carotid stenosis with amaurosis fugax underwent left carotid endarterectomy with CorMatrix arterial patch reconstruction on 01/25/2022. On 02/22/2022 she presented to [facility] urgent care with with 2-day history of swelling pain and drainage from the left neck wound.  She did not have any fever.  She was discharged home on Keflex.  She returned to [facility] ED on 02/24/2022 with blowout of the wound and severe bleeding.  She received blood transfusion and pressure on the wound and was transferred to [facility] on 02/25/2022.  She was taken to the OR by vascular surgeon Dr. Lorenso Courier and underwent left neck exploration, hemostasis and partial CorMatrix patch excision, primary suture repair of common carotid and internal carotid artery with reconstruction.  The culture that was taken had Streptococcus constellatus and I was consulted and put her on IV Unasyn when she was in the hospital.  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  Petechial hemorrhage was also seen.  She had a neuro consult.  She improved significantly except for some mild behavioral issues.  Which also gradually improved.  She was also placed on aspirin and Plavix.  On 03/15/2022 she was transferred to skilled nursing facility on IV ceftriaxone which would finish on 04/07/2022 for a total of 6 weeks and after that she will be getting p.o. Augmentin for another 6 weeks.  Today she is here for follow-up.  She is doing much better.  She has no fever.  She is taking IV antibiotics.   She wants to go home.  She is tired of being in the skilled nursing facility.  She wants to know whether she can switch to p.o. antibiotics earlier than 04/07/2022.  Past Medical History:  Diagnosis Date   Aortic arch atherosclerosis ([place])    Diabetes mellitus without complication ([place])    Essential hypertension    Fibromyalgia    Tobacco abuse     Past Surgical History:  Procedure Laterality Date   CAROTID PTA/STENT INTERVENTION Left 01/24/2022   Procedure: CAROTID PTA/STENT INTERVENTION;  Surgeon: Algernon Huxley, MD;  Location: [place] [facility];  Service: Cardiovascular;  Laterality: Left;   CAROTID PTA/STENT INTERVENTION Left 02/28/2022   Procedure: CAROTID PTA/STENT INTERVENTION;  Surgeon: Algernon Huxley, MD;  Location: [place] [facility];  Service: Cardiovascular;  Laterality: Left;   CERVICAL BIOPSY     CHOLECYSTECTOMY     ENDARTERECTOMY Left 01/25/2022   Procedure: ENDARTERECTOMY CAROTID, possible ligation;  Surgeon: Algernon Huxley, MD;  Location: [facility];  Service: Vascular;  Laterality: Left;   ENDARTERECTOMY Left 02/25/2022   Procedure: ENDARTERECTOMY CAROTID revision;  Surgeon: Evaristo Bury, MD;  Location: [facility] OR[facility];  Service: Vascular;  Laterality: Left;   HERNIA REPAIR     LEFT HEART CATH AND CORONARY ANGIOGRAPHY Left 07/05/2019   Procedure: LEFT HEART CATH AND CORONARY ANGIOGRAPHY;  Surgeon: Wellington Hampshire, MD;  Location: [place] [facility];  Service: Cardiovascular;  Laterality: Left;    Social History   Socioeconomic History   Marital status: Married    Spouse name:  Not on file   Number of children: Not on file   Years of education: Not on file   Highest education level: Not on file  Occupational History   Not on file  Tobacco Use   Smoking status: Every Day    Packs/day: 1.50    Years: 50.00    Pack years: 75.00    Types: Cigarettes   Smokeless tobacco: Never  Substance and Sexual Activity   Alcohol use: Not Currently   Drug use: Yes    Frequency:  7.0 times per week    Types: Marijuana   Sexual activity: Not on file  Other Topics Concern   Not on file  Social History Narrative   Not on file   Social Determinants of Health   Financial Resource Strain: Not on file  Food Insecurity: Not on file  Transportation Needs: Not on file  Physical Activity: Not on file  Stress: Not on file  Social Connections: Not on file  Intimate Partner Violence: Not on file    Family History  Problem Relation Age of Onset   CAD Neg Hx    Allergies  Allergen Reactions   Codeine Hives, Shortness Of Breath and Nausea Only   Toradol [Ketorolac Tromethamine] Hives, Shortness Of Breath and Nausea Only   Tramadol Hcl Shortness Of Breath and Nausea Only    "Conflicts with bipolar condition"   I? Current Outpatient Medications  Medication Sig Dispense Refill   aspirin EC 81 MG EC tablet Take 1 tablet (81 mg total) by mouth daily. Swallow whole. 30 tablet 11   cefTRIAXone (ROCEPHIN) IVPB Inject 2 g into the vein daily for 25 days. Indication: S constellatus endarterectomy graft infection Last Day of Therapy:  04/07/2022 Labs - Once weekly:  CBC/D and CMP 26 Units 0   clopidogrel (PLAVIX) 75 MG tablet Take 1 tablet (75 mg total) by mouth daily.     escitalopram (LEXAPRO) 5 MG tablet Take 5 mg by mouth daily.     feeding supplement (ENSURE ENLIVE / ENSURE PLUS) LIQD Take 237 mLs by mouth 2 (two) times daily between meals. 237 mL 12   gabapentin (NEURONTIN) 300 MG capsule Take 300 mg by mouth 3 (three) times daily.     hydrOXYzine (ATARAX) 10 MG tablet Take 1 tablet (10 mg total) by mouth 3 (three) times daily as needed for anxiety or itching. 30 tablet 0   Insulin Pen Needle (NOVOFINE) 30G X 8 MM MISC Inject 10 each into the skin as needed. 100 each 0   LANTUS 100 UNIT/ML injection Inject 20 Units into the skin 2 (two) times daily.     Multiple Vitamin (MULTIVITAMIN WITH MINERALS) TABS tablet Take 1 tablet by mouth daily.     nicotine (NICODERM CQ -  DOSED IN MG/24 HOURS) 21 mg/24hr patch Place 1 patch (21 mg total) onto the skin daily. 28 patch 0   pantoprazole sodium (PROTONIX) 40 mg Take 40 mg by mouth daily.     rosuvastatin (CRESTOR) 10 MG tablet Take 1 tablet (10 mg total) by mouth daily. 30 tablet 5   No current facility-administered medications for this visit.     Abtx:  Anti-infectives (From admission, onward)    None       REVIEW OF SYSTEMS:  Const: negative fever, negative chills, negative weight loss Eyes: negative diplopia or visual changes, negative eye pain ENT: negative coryza, negative sore throat Resp: negative cough, hemoptysis, dyspnea Cards: negative for chest pain, palpitations, lower extremity edema GU:  negative for frequency, dysuria and hematuria GI: Negative for abdominal pain, diarrhea, bleeding, constipation Skin: negative for rash and pruritus Heme: negative for easy bruising and gum/nose bleeding MS: negative for myalgias, arthralgias, back pain and muscle weakness Neurolo:negative for headaches, dizziness, vertigo, memory problems  Psych: negative for feelings of anxiety, depression  Endocrine: negative for thyroid, diabetes Allergy/Immunology- as above ? Pertinent Positives include : Objective:  VITALS:  BP 130/85   Pulse 82   Temp (!) 97 F (36.1 C) (Temporal)   Ht 5\' 2"  (1.575 m)   Wt 162 lb (73.5 kg)   BMI 29.63 kg/m  LDA RT PICC  PHYSICAL EXAM:  General: Alert, cooperative, no distress, appears stated age. Oriented in person, place, monthyear Head: Normocephalic, without obvious abnormality, atraumatic. Eyes: Conjunctivae clear, anicteric sclerae. Pupils are equal ENT Nares normal. No drainage or sinus tenderness. Lips, mucosa, and tongue normal. No Thrush Neck:left surgical wound has healed completely   no carotid bruit and no JVD. Back: No CVA tenderness. Lungs: Clear to auscultation bilaterally. No Wheezing or Rhonchi. No rales. Heart: Regular rate and rhythm, no  murmur, rub or gallop. Abdomen: Soft, non-tender,not distended. Bowel sounds normal. No masses Extremities: atraumatic, no cyanosis. No edema. No clubbing Skin: No rashes or lesions. Or bruising Lymph: Cervical, supraclavicular normal. Neurologic: Grossly non-focal Pertinent Labs ? Impression/Recommendation Severe atherosclerotic disease of the left carotid with amaurosis fugax for which she underwent endarterectomy and matrix patch on 01/25/2022. Complication by infected graft with blowout and hemorrhage on 02/24/2022.  Patch removal and repair on 02/25/2022.  Stent placement on 02/28/2022.  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  Being treated like an endovascular infection with 6 weeks of IV antibiotics.  She will finish 6 weeks on 04/07/2022 and after that she will go on p.o. Augmentin.  Patient request that she be switched to p.o. earlier than 04/07/2022.  I do not see why it cannot be done.  So she can finish the IV ceftriaxone tomorrow and the PICC line can be removed.  She can then be switched to p.o. Augmentin 875 mg twice daily for 6 weeks.Marland Kitchen  She would like to leave the rehab and go home as soon as possible.  CVA acute right MCA infarct and also multiple small cortical and subcortical infarcts with petechial hemorrhage.  No residual deficit except for possible mild neurocognitive impairment.  I did not do any test to confirm that.  Patient is currently on Plavix and statin. She will have to follow-up with her PCP, vascular and neurologist.  Anemia while in the hospital she received blood transfusion. Today we will do labs.  Diabetes mellitus on insulin  ? ___________________________________________________ Discussed with patient, in detail Labs done today Showed Hb 11 ( was 9.2 on 03/12/22) Cr 0.78  Note:  This document was prepared using Dragon voice recognition software and may include unintentional dictation errors.

## 2022-04-02 NOTE — Patient Instructions (Addendum)
You are here for follow up of the endovascular infection due to streptococcus- you will complete 6 weeks of Iv ceftriaxone on 04/07/22- You are now in a SNF- you want to complete antibiotic tomorrow and remove PICc and want to go home. That should be fine- finishing 4 days earlier is fine as you start amox/clav after that for 6 more weeks

## 2022-04-09 ENCOUNTER — Ambulatory Visit (INDEPENDENT_AMBULATORY_CARE_PROVIDER_SITE_OTHER): Payer: Medicare HMO | Admitting: Vascular Surgery

## 2022-04-09 ENCOUNTER — Encounter (INDEPENDENT_AMBULATORY_CARE_PROVIDER_SITE_OTHER): Payer: Medicare HMO

## 2022-04-19 ENCOUNTER — Other Ambulatory Visit (INDEPENDENT_AMBULATORY_CARE_PROVIDER_SITE_OTHER): Payer: Self-pay | Admitting: Vascular Surgery

## 2022-04-19 ENCOUNTER — Ambulatory Visit (INDEPENDENT_AMBULATORY_CARE_PROVIDER_SITE_OTHER): Payer: Medicare HMO | Admitting: Vascular Surgery

## 2022-04-19 ENCOUNTER — Other Ambulatory Visit (INDEPENDENT_AMBULATORY_CARE_PROVIDER_SITE_OTHER): Payer: Medicare HMO

## 2022-04-19 DIAGNOSIS — Z959 Presence of cardiac and vascular implant and graft, unspecified: Secondary | ICD-10-CM | POA: Diagnosis not present

## 2022-04-19 DIAGNOSIS — Z9889 Other specified postprocedural states: Secondary | ICD-10-CM

## 2022-04-19 DIAGNOSIS — I6522 Occlusion and stenosis of left carotid artery: Secondary | ICD-10-CM

## 2022-04-23 ENCOUNTER — Encounter (INDEPENDENT_AMBULATORY_CARE_PROVIDER_SITE_OTHER): Payer: Self-pay

## 2022-04-23 ENCOUNTER — Ambulatory Visit (INDEPENDENT_AMBULATORY_CARE_PROVIDER_SITE_OTHER): Payer: Medicare HMO | Admitting: Vascular Surgery

## 2022-05-03 ENCOUNTER — Inpatient Hospital Stay (HOSPITAL_COMMUNITY)
Admission: EM | Admit: 2022-05-03 | Discharge: 2022-05-08 | DRG: 391 | Disposition: A | Discharge status: Home Health | Payer: Medicare HMO | Attending: Internal Medicine | Admitting: Internal Medicine

## 2022-05-03 ENCOUNTER — Emergency Department (HOSPITAL_COMMUNITY): Payer: Medicare HMO

## 2022-05-03 ENCOUNTER — Encounter (HOSPITAL_COMMUNITY): Payer: Self-pay | Admitting: Emergency Medicine

## 2022-05-03 ENCOUNTER — Other Ambulatory Visit: Payer: Self-pay

## 2022-05-03 DIAGNOSIS — E861 Hypovolemia: Secondary | ICD-10-CM | POA: Diagnosis present

## 2022-05-03 DIAGNOSIS — I10 Essential (primary) hypertension: Secondary | ICD-10-CM | POA: Diagnosis present

## 2022-05-03 DIAGNOSIS — E876 Hypokalemia: Secondary | ICD-10-CM | POA: Diagnosis present

## 2022-05-03 DIAGNOSIS — F17210 Nicotine dependence, cigarettes, uncomplicated: Secondary | ICD-10-CM | POA: Diagnosis present

## 2022-05-03 DIAGNOSIS — N179 Acute kidney failure, unspecified: Secondary | ICD-10-CM | POA: Diagnosis present

## 2022-05-03 DIAGNOSIS — I639 Cerebral infarction, unspecified: Secondary | ICD-10-CM | POA: Diagnosis present

## 2022-05-03 DIAGNOSIS — M797 Fibromyalgia: Secondary | ICD-10-CM | POA: Diagnosis present

## 2022-05-03 DIAGNOSIS — Z7902 Long term (current) use of antithrombotics/antiplatelets: Secondary | ICD-10-CM

## 2022-05-03 DIAGNOSIS — Z79891 Long term (current) use of opiate analgesic: Secondary | ICD-10-CM

## 2022-05-03 DIAGNOSIS — I6522 Occlusion and stenosis of left carotid artery: Secondary | ICD-10-CM | POA: Diagnosis present

## 2022-05-03 DIAGNOSIS — R112 Nausea with vomiting, unspecified: Secondary | ICD-10-CM | POA: Diagnosis not present

## 2022-05-03 DIAGNOSIS — Z794 Long term (current) use of insulin: Secondary | ICD-10-CM

## 2022-05-03 DIAGNOSIS — G9341 Metabolic encephalopathy: Secondary | ICD-10-CM | POA: Diagnosis present

## 2022-05-03 DIAGNOSIS — D649 Anemia, unspecified: Secondary | ICD-10-CM | POA: Diagnosis present

## 2022-05-03 DIAGNOSIS — R410 Disorientation, unspecified: Secondary | ICD-10-CM

## 2022-05-03 DIAGNOSIS — E860 Dehydration: Secondary | ICD-10-CM | POA: Diagnosis present

## 2022-05-03 DIAGNOSIS — A084 Viral intestinal infection, unspecified: Principal | ICD-10-CM | POA: Diagnosis present

## 2022-05-03 DIAGNOSIS — Z7982 Long term (current) use of aspirin: Secondary | ICD-10-CM

## 2022-05-03 DIAGNOSIS — G9340 Encephalopathy, unspecified: Secondary | ICD-10-CM | POA: Diagnosis present

## 2022-05-03 DIAGNOSIS — R300 Dysuria: Secondary | ICD-10-CM | POA: Diagnosis present

## 2022-05-03 DIAGNOSIS — Z8673 Personal history of transient ischemic attack (TIA), and cerebral infarction without residual deficits: Secondary | ICD-10-CM

## 2022-05-03 DIAGNOSIS — Z79899 Other long term (current) drug therapy: Secondary | ICD-10-CM

## 2022-05-03 DIAGNOSIS — I2510 Atherosclerotic heart disease of native coronary artery without angina pectoris: Secondary | ICD-10-CM | POA: Diagnosis present

## 2022-05-03 DIAGNOSIS — G8929 Other chronic pain: Secondary | ICD-10-CM | POA: Diagnosis present

## 2022-05-03 DIAGNOSIS — E119 Type 2 diabetes mellitus without complications: Secondary | ICD-10-CM | POA: Diagnosis present

## 2022-05-03 DIAGNOSIS — R350 Frequency of micturition: Secondary | ICD-10-CM | POA: Diagnosis present

## 2022-05-03 DIAGNOSIS — E871 Hypo-osmolality and hyponatremia: Secondary | ICD-10-CM | POA: Diagnosis present

## 2022-05-03 LAB — I-STAT CHEM 8, ED
BUN: 34 mg/dL — ABNORMAL HIGH (ref 8–23)
Calcium, Ion: 1.13 mmol/L — ABNORMAL LOW (ref 1.15–1.40)
Chloride: 92 mmol/L — ABNORMAL LOW (ref 98–111)
Creatinine, Ser: 0.8 mg/dL (ref 0.44–1.00)
Glucose, Bld: 279 mg/dL — ABNORMAL HIGH (ref 70–99)
HCT: 35 % — ABNORMAL LOW (ref 36.0–46.0)
Hemoglobin: 11.9 g/dL — ABNORMAL LOW (ref 12.0–15.0)
Potassium: 3.5 mmol/L (ref 3.5–5.1)
Sodium: 130 mmol/L — ABNORMAL LOW (ref 135–145)
TCO2: 25 mmol/L (ref 22–32)

## 2022-05-03 LAB — COMPREHENSIVE METABOLIC PANEL
ALT: 21 U/L (ref 0–44)
AST: 25 U/L (ref 15–41)
Albumin: 3.5 g/dL (ref 3.5–5.0)
Alkaline Phosphatase: 101 U/L (ref 38–126)
Anion gap: 13 (ref 5–15)
BUN: 36 mg/dL — ABNORMAL HIGH (ref 8–23)
CO2: 26 mmol/L (ref 22–32)
Calcium: 9.2 mg/dL (ref 8.9–10.3)
Chloride: 91 mmol/L — ABNORMAL LOW (ref 98–111)
Creatinine, Ser: 0.92 mg/dL (ref 0.44–1.00)
GFR, Estimated: >60 mL/min (ref >60)
Glucose, Bld: 290 mg/dL — ABNORMAL HIGH (ref 70–99)
Potassium: 3.6 mmol/L (ref 3.5–5.1)
Sodium: 130 mmol/L — ABNORMAL LOW (ref 135–145)
Total Bilirubin: 0.9 mg/dL (ref 0.3–1.2)
Total Protein: 6.8 g/dL (ref 6.5–8.1)

## 2022-05-03 LAB — CBC WITH DIFFERENTIAL/PLATELET
Abs Immature Granulocytes: 0.1 10*3/uL — ABNORMAL HIGH (ref 0.00–0.07)
Basophils Absolute: 0 10*3/uL (ref 0.0–0.1)
Basophils Relative: 0 %
Eosinophils Absolute: 0 10*3/uL (ref 0.0–0.5)
Eosinophils Relative: 0 %
HCT: 33.2 % — ABNORMAL LOW (ref 36.0–46.0)
Hemoglobin: 12 g/dL (ref 12.0–15.0)
Immature Granulocytes: 1 %
Lymphocytes Relative: 15 %
Lymphs Abs: 1.9 10*3/uL (ref 0.7–4.0)
MCH: 31.3 pg (ref 26.0–34.0)
MCHC: 36.1 g/dL — ABNORMAL HIGH (ref 30.0–36.0)
MCV: 86.5 fL (ref 80.0–100.0)
Monocytes Absolute: 0.8 10*3/uL (ref 0.1–1.0)
Monocytes Relative: 7 %
Neutro Abs: 9.4 10*3/uL — ABNORMAL HIGH (ref 1.7–7.7)
Neutrophils Relative %: 77 %
Platelets: 268 10*3/uL (ref 150–400)
RBC: 3.84 MIL/uL — ABNORMAL LOW (ref 3.87–5.11)
RDW: 13.3 % (ref 11.5–15.5)
WBC: 12.2 10*3/uL — ABNORMAL HIGH (ref 4.0–10.5)
nRBC: 0 % (ref 0.0–0.2)

## 2022-05-03 LAB — I-STAT VENOUS BLOOD GAS, ED
Acid-Base Excess: 6 mmol/L — ABNORMAL HIGH (ref 0.0–2.0)
Bicarbonate: 28.4 mmol/L — ABNORMAL HIGH (ref 20.0–28.0)
Calcium, Ion: 1.12 mmol/L — ABNORMAL LOW (ref 1.15–1.40)
HCT: 34 % — ABNORMAL LOW (ref 36.0–46.0)
Hemoglobin: 11.6 g/dL — ABNORMAL LOW (ref 12.0–15.0)
O2 Saturation: 90 %
Potassium: 3.6 mmol/L (ref 3.5–5.1)
Sodium: 130 mmol/L — ABNORMAL LOW (ref 135–145)
TCO2: 29 mmol/L (ref 22–32)
pCO2, Ven: 32.8 mmHg — ABNORMAL LOW (ref 44–60)
pH, Ven: 7.545 — ABNORMAL HIGH (ref 7.25–7.43)
pO2, Ven: 50 mmHg — ABNORMAL HIGH (ref 32–45)

## 2022-05-03 LAB — CBG MONITORING, ED: Glucose-Capillary: 279 mg/dL — ABNORMAL HIGH (ref 70–99)

## 2022-05-03 LAB — ETHANOL: Alcohol, Ethyl (B): <10 mg/dL (ref <10)

## 2022-05-03 MED ORDER — IOHEXOL 9 MG/ML PO SOLN
ORAL | Status: AC
  Filled 2022-05-03: qty 1000

## 2022-05-03 MED ORDER — SODIUM CHLORIDE 0.9 % IV BOLUS
1000.0000 mL | Freq: Once | INTRAVENOUS | Status: AC
  Administered 2022-05-04: 1000 mL via INTRAVENOUS

## 2022-05-04 ENCOUNTER — Emergency Department (HOSPITAL_COMMUNITY): Payer: Medicare HMO

## 2022-05-04 ENCOUNTER — Encounter (HOSPITAL_COMMUNITY): Payer: Self-pay | Admitting: Internal Medicine

## 2022-05-04 DIAGNOSIS — G9340 Encephalopathy, unspecified: Secondary | ICD-10-CM | POA: Diagnosis present

## 2022-05-04 DIAGNOSIS — A084 Viral intestinal infection, unspecified: Secondary | ICD-10-CM

## 2022-05-04 LAB — URINALYSIS, ROUTINE W REFLEX MICROSCOPIC
Bilirubin Urine: NEGATIVE
Glucose, UA: >=500 mg/dL — AB
Ketones, ur: 5 mg/dL — AB
Leukocytes,Ua: NEGATIVE
Nitrite: NEGATIVE
Protein, ur: 100 mg/dL — AB
Specific Gravity, Urine: 1.02 (ref 1.005–1.030)
pH: 6 (ref 5.0–8.0)

## 2022-05-04 LAB — RAPID URINE DRUG SCREEN, HOSP PERFORMED
Amphetamines: NOT DETECTED
Barbiturates: NOT DETECTED
Benzodiazepines: NOT DETECTED
Cocaine: NOT DETECTED
Opiates: NOT DETECTED
Tetrahydrocannabinol: NOT DETECTED

## 2022-05-04 LAB — GLUCOSE, CAPILLARY
Glucose-Capillary: 203 mg/dL — ABNORMAL HIGH (ref 70–99)
Glucose-Capillary: 149 mg/dL — ABNORMAL HIGH (ref 70–99)
Glucose-Capillary: 246 mg/dL — ABNORMAL HIGH (ref 70–99)

## 2022-05-04 LAB — LIPASE, BLOOD: Lipase: 21 U/L (ref 11–51)

## 2022-05-04 LAB — CBG MONITORING, ED: Glucose-Capillary: 229 mg/dL — ABNORMAL HIGH (ref 70–99)

## 2022-05-04 LAB — LACTIC ACID, PLASMA: Lactic Acid, Venous: 1.7 mmol/L (ref 0.5–1.9)

## 2022-05-04 LAB — TROPONIN I (HIGH SENSITIVITY): Troponin I (High Sensitivity): 13 ng/L (ref <18)

## 2022-05-04 MED ORDER — ENOXAPARIN SODIUM 40 MG/0.4ML IJ SOSY
40.0000 mg | PREFILLED_SYRINGE | INTRAMUSCULAR | Status: DC
  Administered 2022-05-04 – 2022-05-08 (×5): 40 mg via SUBCUTANEOUS
  Filled 2022-05-04 (×5): qty 0.4

## 2022-05-04 MED ORDER — AMOXICILLIN-POT CLAVULANATE 875-125 MG PO TABS
1.0000 | ORAL_TABLET | Freq: Two times a day (BID) | ORAL | Status: DC
  Administered 2022-05-04 – 2022-05-08 (×9): 1 via ORAL
  Filled 2022-05-04 (×9): qty 1

## 2022-05-04 MED ORDER — ONDANSETRON HCL 4 MG/2ML IJ SOLN
4.0000 mg | Freq: Once | INTRAMUSCULAR | Status: AC
  Administered 2022-05-04: 4 mg via INTRAVENOUS
  Filled 2022-05-04: qty 2

## 2022-05-04 MED ORDER — INSULIN ASPART 100 UNIT/ML IJ SOLN
0.0000 [IU] | INTRAMUSCULAR | Status: DC
  Administered 2022-05-04 (×2): 5 [IU] via SUBCUTANEOUS
  Administered 2022-05-04: 2 [IU] via SUBCUTANEOUS
  Administered 2022-05-04: 5 [IU] via SUBCUTANEOUS
  Administered 2022-05-05: 2 [IU] via SUBCUTANEOUS
  Administered 2022-05-05 (×3): 3 [IU] via SUBCUTANEOUS

## 2022-05-04 MED ORDER — INSULIN GLARGINE-YFGN 100 UNIT/ML ~~LOC~~ SOLN
5.0000 [IU] | Freq: Every day | SUBCUTANEOUS | Status: DC
  Administered 2022-05-04 – 2022-05-07 (×4): 5 [IU] via SUBCUTANEOUS
  Filled 2022-05-04 (×5): qty 0.05

## 2022-05-04 MED ORDER — IOHEXOL 300 MG/ML  SOLN
100.0000 mL | Freq: Once | INTRAMUSCULAR | Status: AC | PRN
Start: 2022-05-04 — End: 2022-05-04
  Administered 2022-05-04: 100 mL via INTRAVENOUS

## 2022-05-04 MED ORDER — ESCITALOPRAM OXALATE 10 MG PO TABS
5.0000 mg | ORAL_TABLET | Freq: Every day | ORAL | Status: DC
  Administered 2022-05-04: 5 mg via ORAL
  Filled 2022-05-04: qty 1

## 2022-05-04 MED ORDER — ONDANSETRON HCL 4 MG/2ML IJ SOLN
4.0000 mg | Freq: Four times a day (QID) | INTRAMUSCULAR | Status: DC | PRN
  Administered 2022-05-04 – 2022-05-06 (×4): 4 mg via INTRAVENOUS
  Filled 2022-05-04 (×5): qty 2

## 2022-05-04 MED ORDER — ACETAMINOPHEN 650 MG RE SUPP: 650.0000 mg | Freq: Four times a day (QID) | RECTAL | Status: DC | PRN

## 2022-05-04 MED ORDER — NICOTINE 21 MG/24HR TD PT24
21.0000 mg | MEDICATED_PATCH | Freq: Every day | TRANSDERMAL | Status: DC
Start: 2022-05-04 — End: 2022-05-08
  Administered 2022-05-04 – 2022-05-08 (×5): 21 mg via TRANSDERMAL
  Filled 2022-05-04 (×5): qty 1

## 2022-05-04 MED ORDER — LACTATED RINGERS IV BOLUS
1000.0000 mL | Freq: Once | INTRAVENOUS | Status: AC
  Administered 2022-05-04: 1000 mL via INTRAVENOUS

## 2022-05-04 MED ORDER — ROSUVASTATIN CALCIUM 5 MG PO TABS
10.0000 mg | ORAL_TABLET | Freq: Every day | ORAL | Status: DC
  Administered 2022-05-04 – 2022-05-08 (×5): 10 mg via ORAL
  Filled 2022-05-04 (×5): qty 2

## 2022-05-04 MED ORDER — ONDANSETRON HCL 4 MG PO TABS: 4.0000 mg | ORAL_TABLET | Freq: Four times a day (QID) | ORAL | Status: DC | PRN

## 2022-05-04 MED ORDER — ASPIRIN 81 MG PO TBEC
81.0000 mg | DELAYED_RELEASE_TABLET | Freq: Every day | ORAL | Status: DC
  Administered 2022-05-04 – 2022-05-08 (×5): 81 mg via ORAL
  Filled 2022-05-04 (×5): qty 1

## 2022-05-04 MED ORDER — ACETAMINOPHEN 325 MG PO TABS
650.0000 mg | ORAL_TABLET | Freq: Four times a day (QID) | ORAL | Status: DC | PRN
  Administered 2022-05-04 – 2022-05-08 (×10): 650 mg via ORAL
  Filled 2022-05-04 (×12): qty 2

## 2022-05-04 MED ORDER — OXYCODONE-ACETAMINOPHEN 5-325 MG PO TABS
1.0000 | ORAL_TABLET | Freq: Two times a day (BID) | ORAL | Status: DC
  Administered 2022-05-04 – 2022-05-06 (×5): 1 via ORAL
  Filled 2022-05-04 (×5): qty 1

## 2022-05-04 MED ORDER — CLOPIDOGREL BISULFATE 75 MG PO TABS
75.0000 mg | ORAL_TABLET | Freq: Every day | ORAL | Status: DC
  Administered 2022-05-04 – 2022-05-08 (×5): 75 mg via ORAL
  Filled 2022-05-04 (×5): qty 1

## 2022-05-04 NOTE — Evaluation (Signed)
Physical Therapy Evaluation Patient Details Name: Veronica Murray MRN: [id number] DOB: 11-21-1955 Today's Date: 05/04/2022  History of Present Illness  Patient is a 66 y/o female who presents with nausea, vomiting, poor PO intake, weakness and fatigue. Admitted with acute encephalopathy secondary to dehyration from viral gastroenteritis. PMH includes CVA, DM, fibromyalgia, tobacco use disorder, carotid stenosis s/p left carotid endarterectomy 01/2022, s/p left neck exploration/patch excision of CCA and ICA repair and contruction 02/2022, infected left neck wound.  Clinical Impression  Patient presents with back pain (chronic in nature), confusion, weakness, decreased activity tolerance and impaired mobility s/p above. Pt is a poor historian so not sure of accuracy of reported PLOF. Pt states she uses RW PRN and lives with her spouse but he helps her with all mobility and ADLs due to back pain. Today, pt requires Max A for rolling and bed mobility with little to no effort/assist with movement despite max cues. Pt oriented to self and place today. Pt self limiting during session. Will need to further assess mobility once pain is more controlled to help determine appropriate disposition and support level at home. Will follow acutely.       Recommendations for follow up therapy are one component of a multi-disciplinary discharge planning process, led by the attending physician.  Recommendations may be updated based on patient status, additional functional criteria and insurance authorization.  Follow Up Recommendations Skilled nursing-short term rehab (<3 hours/day) (pending progress) Can patient physically be transported by private vehicle: No    Assistance Recommended at Discharge Frequent or constant Supervision/Assistance  Patient can return home with the following  A lot of help with walking and/or transfers;A lot of help with bathing/dressing/bathroom;Assistance with cooking/housework;Assist for  transportation;Help with stairs or ramp for entrance;Direct supervision/assist for medications management;Direct supervision/assist for financial management;A little help with bathing/dressing/bathroom    Equipment Recommendations None recommended by PT  Recommendations for Other Services       Functional Status Assessment Patient has had a recent decline in their functional status and demonstrates the ability to make significant improvements in function in a reasonable and predictable amount of time.     Precautions / Restrictions Precautions Precautions: Fall Restrictions Weight Bearing Restrictions: No      Mobility  Bed Mobility Overal bed mobility: Needs Assistance Bed Mobility: Rolling, Supine to Sit, Sit to Supine Rolling: Max assist   Supine to sit: Max assist, HOB elevated Sit to supine: Mod assist, HOB elevated   General bed mobility comments: Attempted rolling with Max A however pt reports "that is not going to work." Assist with bringing LEs to EOB and to elevate trunk, little initiation and assist with movement. Sat upright but declined scooting forward or any further movement due to pain.    Transfers                   General transfer comment: Unable to assess    Ambulation/Gait               General Gait Details: Unable to assess  Stairs            Wheelchair Mobility    Modified Rankin (Stroke Patients Only)       Balance                                             Pertinent Vitals/Pain Pain Assessment  Pain Assessment: Faces Pain Score: 10-Worst pain ever Pain Location: chronic back pain Pain Descriptors / Indicators: Grimacing, Guarding, Discomfort, Sore, Sharp Pain Intervention(s): Monitored during session, Limited activity within patient's tolerance, Repositioned, Patient requesting pain meds-RN notified    Home Living Family/patient expects to be discharged to:: Private residence Living  Arrangements: Spouse/significant other Available Help at Discharge: Family Type of Home: House Home Access: Level entry       Home Layout: One level Home Equipment: Agricultural consultant (2 wheels) Additional Comments: Pt poor historian so unsure of accuracy of reported PLOF. Per chart from 2 months ago, pt reports living in a hotel after separating from spouse vs living with spouse in mobile home??    Prior Function Prior Level of Function : Patient poor historian/Family not available             Mobility Comments: per patient, she was ambulatory using RW. ADLs Comments: per patient, she needed assistance for ADLs from husband     Hand Dominance   Dominant Hand: Right    Extremity/Trunk Assessment   Upper Extremity Assessment Upper Extremity Assessment: Defer to OT evaluation    Lower Extremity Assessment Lower Extremity Assessment: Generalized weakness;Difficult to assess due to impaired cognition    Cervical / Trunk Assessment Cervical / Trunk Assessment: Other exceptions Cervical / Trunk Exceptions: chronic back pain  Communication   Communication: Other (comment) (soft spoken)  Cognition Arousal/Alertness: Lethargic Behavior During Therapy: Flat affect Overall Cognitive Status: Impaired/Different from baseline Area of Impairment: Memory, Attention, Following commands, Safety/judgement, Orientation, Problem solving                 Orientation Level: Disoriented to, Time, Situation Current Attention Level: Focused Memory: Decreased short-term memory Following Commands: Follows one step commands inconsistently Safety/Judgement: Decreased awareness of deficits, Decreased awareness of safety   Problem Solving: Slow processing, Difficulty sequencing, Requires verbal cues General Comments: "i got hurt" for why she is here. Poor historian.        General Comments      Exercises     Assessment/Plan    PT Assessment Patient needs continued PT services  PT  Problem List Decreased strength;Decreased range of motion;Decreased mobility;Decreased safety awareness;Decreased balance;Pain;Decreased cognition;Decreased activity tolerance       PT Treatment Interventions Therapeutic activities;Gait training;Therapeutic exercise;Patient/family education;Balance training;Functional mobility training;DME instruction;Cognitive remediation;Stair training    PT Goals (Current goals can be found in the Care Plan section)  Acute Rehab PT Goals Patient Stated Goal: decrease pain PT Goal Formulation: Patient unable to participate in goal setting Time For Goal Achievement: 05/18/22 Potential to Achieve Goals: Fair    Frequency Min 3X/week     Co-evaluation               AM-PAC PT "6 Clicks" Mobility  Outcome Measure Help needed turning from your back to your side while in a flat bed without using bedrails?: Total Help needed moving from lying on your back to sitting on the side of a flat bed without using bedrails?: Total Help needed moving to and from a bed to a chair (including a wheelchair)?: Total Help needed standing up from a chair using your arms (e.g., wheelchair or bedside chair)?: Total Help needed to walk in hospital room?: Total Help needed climbing 3-5 steps with a railing? : Total 6 Click Score: 6    End of Session   Activity Tolerance: Patient limited by pain;Patient limited by lethargy;Other (comment) (confusion) Patient left: in bed;with call bell/phone within reach;with  bed alarm set Nurse Communication: Patient requests pain meds;Mobility status PT Visit Diagnosis: Pain;Muscle weakness (generalized) (M62.81);Difficulty in walking, not elsewhere classified (R26.2) Pain - part of body:  (back)    Time: 1446-1500 PT Time Calculation (min) (ACUTE ONLY): 14 min   Charges:   PT Evaluation $PT Eval Moderate Complexity: 1 Mod          Vale Haven, PT, DPT Acute Rehabilitation Services Secure chat preferred Office  [phone]     Blake Divine A Kimball Appleby 05/04/2022, 3:10 PM

## 2022-05-04 NOTE — H&P (Signed)
Date: 05/04/2022               Patient Name:  Veronica Murray MRN: [id number]  DOB: 07/27/1956 Age / Sex: 66 y.o., female   PCP: Patient, No Pcp Per         Medical Service: Internal Medicine Teaching Service         Attending Physician: Dr. Mayford Knife, Dorene Ar, MD    First Contact: Dr. Park Pope Pager: [phone]  Second Contact: Dr. Doran Stabler  Pager: [phone]       After Hours (After 5p/  First Contact Pager: [phone]  weekends / holidays): Second Contact Pager: [phone]   Chief Complaint: nausea, vomiting  History of Present Illness:   Veronica Murray is a 66 year old female with past medical history of type 2 diabetes, fibromyalgia, tobacco use disorder, R MCA infarct, carotid stenosis status post left carotid endarterectomy 01/2022, s/p left neck exploration/patch excision of CCA and ICA repair and contruction 02/2022, infected left neck wound with Streptococcus constellatus on IV ceftriaxone and Augmentin after, who presents to the ED for nausea, vomiting and acute encephalopathy.  Reports symptoms started about 4 days ago.  States that she felt like she was getting sick with may be fever or chills.  Vomited multiple times a day.  Emesis was nonbloody.  She is unable to tolerate p.o.  Also endorses sharp abdominal pain that worse with vomiting.  She denies diarrhea, melena or hematochezia.  She also endorses dysuria with urinary frequency.  She denies eating any new food or new traveling.  York Spaniel that her husband also has similar symptoms but has resolved within 1 day.  She report adherence to her medication but has missed a few doses of her insulin and also Augmentin due to being sick.  Say that she fell 3 days ago due to not feeling well.  Endorses chronic back pain.  Meds:  Current Meds  Medication Sig   amoxicillin-clavulanate (AUGMENTIN) 875-125 MG tablet Take 1 tablet by mouth 2 (two) times daily.   hydrOXYzine (ATARAX) 10 MG tablet Take 1 tablet (10 mg total) by  mouth 3 (three) times daily as needed for anxiety or itching.   Insulin Glargine (LANTUS SOLOSTAR ) Inject 20 Units into the skin in the morning and at bedtime.   Multiple Vitamin (MULTIVITAMIN WITH MINERALS) TABS tablet Take 1 tablet by mouth daily.   nicotine (NICODERM CQ - DOSED IN MG/24 HOURS) 21 mg/24hr patch Place 1 patch (21 mg total) onto the skin daily.   oxyCODONE-acetaminophen (PERCOCET/ROXICET) 5-325 MG tablet Take 1 tablet by mouth 3 (three) times daily as needed for moderate pain.   pantoprazole sodium (PROTONIX) 40 mg Take 40 mg by mouth daily.   rosuvastatin (CRESTOR) 10 MG tablet Take 1 tablet (10 mg total) by mouth daily.     Allergies: Allergies as of 05/03/2022 - Review Complete 04/02/2022  Allergen Reaction Noted   Codeine Hives, Shortness Of Breath, and Nausea Only 06/25/2019   Toradol [ketorolac tromethamine] Hives, Shortness Of Breath, and Nausea Only 06/29/2019   Tramadol hcl Shortness Of Breath and Nausea Only 06/29/2019   Past Medical History:  Diagnosis Date   Aortic arch atherosclerosis (HCC)    Diabetes mellitus without complication (HCC)    Essential hypertension    Fibromyalgia    Tobacco abuse     Family History:  Denies any pertinent medical history history and family Family History  Problem Relation Age of Onset   CAD Neg Hx  Social History:  -Lives with her husband -Mostly independent with ADLs -Denies alcohol use -Smoke 2 pack a day for the last 5 years -Denies any drug use Does not have a PCP  Review of Systems: A complete ROS was negative except as per HPI.   Physical Exam: Blood pressure (!) 151/78, pulse 85, temperature 98.9 F (37.2 C), temperature source Oral, resp. rate 20, height 5\' 2"  (1.575 m), weight 69.3 kg, SpO2 98 %. Physical Exam Constitutional:      General: She is not in acute distress.    Comments: Patient is oriented to person and place only  HENT:     Head: Normocephalic.     Mouth/Throat:     Mouth:  Mucous membranes are dry.  Eyes:     General:        Right eye: No discharge.        Left eye: No discharge.     Conjunctiva/sclera: Conjunctivae normal.  Cardiovascular:     Rate and Rhythm: Normal rate and regular rhythm.     Comments: No LE edema Pulmonary:     Effort: Pulmonary effort is normal. No respiratory distress.     Breath sounds: Normal breath sounds. No wheezing.  Abdominal:     General: Bowel sounds are normal.     Palpations: Abdomen is soft.     Tenderness: There is no guarding.     Hernia: No hernia is present.     Comments: Mild abdominal tenderness to palpation in the right lower quadrant.  Negative Murphy sign.  Musculoskeletal:        General: Normal range of motion.     Cervical back: Normal range of motion.  Skin:    General: Skin is warm and dry.     Comments: Poor skin turgor  Neurological:     Mental Status: She is alert.     Comments: PERRLA EOM intact No deficit of cranial nerve. 5/5 strength of bilateral upper and lower extremities.  Sensation intact. Normal finger-to-nose test.  Psychiatric:        Mood and Affect: Mood normal.      EKG: personally reviewed my interpretation is sinus rhythm  CXR: personally reviewed my interpretation is unremarkable  Assessment & Plan by Problem: Principal Problem:   Acute encephalopathy Active Problems:   Carotid stenosis, left   CVA (cerebral vascular accident) (HCC)   DM2 (diabetes mellitus, type 2) (HCC)   Viral gastroenteritis  Veronica Murray is a 66 year old female with past medical history of type 2 diabetes, fibromyalgia, tobacco use disorder, R MCA infarct, carotid stenosis status post left carotid endarterectomy 01/2022, s/p left neck exploration/patch excision of CCA and ICA repair and contruction 02/2022, infected left neck wound with Streptococcus constellatus on IV ceftriaxone and Augmentin after, who presents to the ED for nausea, vomiting and acute encephalopathy secondary to  dehydration  Acute encephalopathy Secondary to dehydration from viral gastroenteritis and poor p.o. intake.  Patient was alert and oriented x2.  No focal neurological deficit on physical exam.  CT head was unremarkable. -IV fluid for hydration -Resume home Percocet at a lower dose (BID) to avoid worsening mental status. -Holding other centrally acting medications  Viral gastroenteritis  Patient and husband have similar symptoms.  Her nausea and emesis has improved this morning. -Encourage p.o. intake with clear liquid diet -Zofran as needed for nausea -Hydration with IV fluids  Urinary frequency and dysuria Low suspicion for UTI.  UA was negative for nitrites and leukocytes.  She  was also on Augmentin for her left carotid wound infection so low probability of UTI.  Low yield for urine culture at this time.  Suspect her urinary frequency was due to glucosuria.  AKI Mild hypovolemic hyponatremia Baseline creatinine 0.4, elevated at 0.9 on admission.  Likely prerenal from poor p.o. intake -IV fluid hydration -Encourage p.o. intake -BMP in a.m.  Left carotid stenosis status post repair Left neck wound infection Left endarterectomy and matrix patch on 01/2022. Complication by infected graft and bleeding on 02/24/2022.  Patch removal and repair on 02/25/2022.  Stent placement on 02/28/2022.  Wound culture grew strep constellatus. 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  She is taking 20 days of Lantus at home. -Sliding scale every 4 hours until consistent p.o. intake -Semglee 5 units at bedtime  Clear liquid diet Partial code.  DNI DVT: Lovenox IVF: LR  Dispo: Admit patient to Observation with expected length of stay less than 2 midnights.  SignedDoran Stabler,  DO 05/04/2022, 12:06 PM  Pager: [phone] After 5pm on weekdays and 1pm on weekends: On Call pager: [phone]

## 2022-05-05 ENCOUNTER — Observation Stay (HOSPITAL_COMMUNITY): Payer: Medicare HMO

## 2022-05-05 DIAGNOSIS — I2510 Atherosclerotic heart disease of native coronary artery without angina pectoris: Secondary | ICD-10-CM | POA: Diagnosis present

## 2022-05-05 DIAGNOSIS — E119 Type 2 diabetes mellitus without complications: Secondary | ICD-10-CM | POA: Diagnosis present

## 2022-05-05 DIAGNOSIS — R410 Disorientation, unspecified: Secondary | ICD-10-CM | POA: Diagnosis not present

## 2022-05-05 DIAGNOSIS — D649 Anemia, unspecified: Secondary | ICD-10-CM

## 2022-05-05 DIAGNOSIS — N179 Acute kidney failure, unspecified: Secondary | ICD-10-CM | POA: Diagnosis present

## 2022-05-05 DIAGNOSIS — Z7982 Long term (current) use of aspirin: Secondary | ICD-10-CM | POA: Diagnosis not present

## 2022-05-05 DIAGNOSIS — M25552 Pain in left hip: Secondary | ICD-10-CM | POA: Diagnosis not present

## 2022-05-05 DIAGNOSIS — G9340 Encephalopathy, unspecified: Secondary | ICD-10-CM | POA: Diagnosis not present

## 2022-05-05 DIAGNOSIS — G8929 Other chronic pain: Secondary | ICD-10-CM

## 2022-05-05 DIAGNOSIS — Z8673 Personal history of transient ischemic attack (TIA), and cerebral infarction without residual deficits: Secondary | ICD-10-CM | POA: Diagnosis not present

## 2022-05-05 DIAGNOSIS — E876 Hypokalemia: Secondary | ICD-10-CM | POA: Diagnosis present

## 2022-05-05 DIAGNOSIS — I6522 Occlusion and stenosis of left carotid artery: Secondary | ICD-10-CM | POA: Diagnosis not present

## 2022-05-05 DIAGNOSIS — F17210 Nicotine dependence, cigarettes, uncomplicated: Secondary | ICD-10-CM | POA: Diagnosis present

## 2022-05-05 DIAGNOSIS — E860 Dehydration: Secondary | ICD-10-CM | POA: Diagnosis present

## 2022-05-05 DIAGNOSIS — R350 Frequency of micturition: Secondary | ICD-10-CM | POA: Diagnosis present

## 2022-05-05 DIAGNOSIS — R112 Nausea with vomiting, unspecified: Secondary | ICD-10-CM | POA: Diagnosis present

## 2022-05-05 DIAGNOSIS — Z794 Long term (current) use of insulin: Secondary | ICD-10-CM | POA: Diagnosis not present

## 2022-05-05 DIAGNOSIS — M549 Dorsalgia, unspecified: Secondary | ICD-10-CM

## 2022-05-05 DIAGNOSIS — A084 Viral intestinal infection, unspecified: Secondary | ICD-10-CM

## 2022-05-05 DIAGNOSIS — E861 Hypovolemia: Secondary | ICD-10-CM | POA: Diagnosis present

## 2022-05-05 DIAGNOSIS — R300 Dysuria: Secondary | ICD-10-CM

## 2022-05-05 DIAGNOSIS — G9341 Metabolic encephalopathy: Secondary | ICD-10-CM | POA: Diagnosis present

## 2022-05-05 DIAGNOSIS — Z79891 Long term (current) use of opiate analgesic: Secondary | ICD-10-CM

## 2022-05-05 DIAGNOSIS — M797 Fibromyalgia: Secondary | ICD-10-CM | POA: Diagnosis present

## 2022-05-05 DIAGNOSIS — Z7902 Long term (current) use of antithrombotics/antiplatelets: Secondary | ICD-10-CM | POA: Diagnosis not present

## 2022-05-05 DIAGNOSIS — E871 Hypo-osmolality and hyponatremia: Secondary | ICD-10-CM | POA: Diagnosis present

## 2022-05-05 DIAGNOSIS — Z79899 Other long term (current) drug therapy: Secondary | ICD-10-CM | POA: Diagnosis not present

## 2022-05-05 DIAGNOSIS — I10 Essential (primary) hypertension: Secondary | ICD-10-CM | POA: Diagnosis present

## 2022-05-05 LAB — GLUCOSE, CAPILLARY
Glucose-Capillary: 135 mg/dL — ABNORMAL HIGH (ref 70–99)
Glucose-Capillary: 147 mg/dL — ABNORMAL HIGH (ref 70–99)
Glucose-Capillary: 156 mg/dL — ABNORMAL HIGH (ref 70–99)
Glucose-Capillary: 166 mg/dL — ABNORMAL HIGH (ref 70–99)
Glucose-Capillary: 187 mg/dL — ABNORMAL HIGH (ref 70–99)
Glucose-Capillary: 196 mg/dL — ABNORMAL HIGH (ref 70–99)

## 2022-05-05 LAB — CBC
HCT: 31.3 % — ABNORMAL LOW (ref 36.0–46.0)
Hemoglobin: 11.3 g/dL — ABNORMAL LOW (ref 12.0–15.0)
MCH: 30.9 pg (ref 26.0–34.0)
MCHC: 36.1 g/dL — ABNORMAL HIGH (ref 30.0–36.0)
MCV: 85.5 fL (ref 80.0–100.0)
Platelets: 218 10*3/uL (ref 150–400)
RBC: 3.66 MIL/uL — ABNORMAL LOW (ref 3.87–5.11)
RDW: 12.7 % (ref 11.5–15.5)
WBC: 7.3 10*3/uL (ref 4.0–10.5)
nRBC: 0 % (ref 0.0–0.2)

## 2022-05-05 LAB — BASIC METABOLIC PANEL
Anion gap: 10 (ref 5–15)
BUN: 18 mg/dL (ref 8–23)
CO2: 26 mmol/L (ref 22–32)
Calcium: 8.9 mg/dL (ref 8.9–10.3)
Chloride: 96 mmol/L — ABNORMAL LOW (ref 98–111)
Creatinine, Ser: 0.51 mg/dL (ref 0.44–1.00)
GFR, Estimated: >60 mL/min (ref >60)
Glucose, Bld: 142 mg/dL — ABNORMAL HIGH (ref 70–99)
Potassium: 3.1 mmol/L — ABNORMAL LOW (ref 3.5–5.1)
Sodium: 132 mmol/L — ABNORMAL LOW (ref 135–145)

## 2022-05-05 LAB — RETICULOCYTES
Immature Retic Fract: 9.6 % (ref 2.3–15.9)
RBC.: 3.65 MIL/uL — ABNORMAL LOW (ref 3.87–5.11)
Retic Count, Absolute: 50 10*3/uL (ref 19.0–186.0)
Retic Ct Pct: 1.4 % (ref 0.4–3.1)

## 2022-05-05 LAB — IRON AND TIBC
Iron: 34 ug/dL (ref 28–170)
Saturation Ratios: 14 % (ref 10.4–31.8)
TIBC: 235 ug/dL — ABNORMAL LOW (ref 250–450)
UIBC: 201 ug/dL

## 2022-05-05 LAB — VITAMIN B12: Vitamin B-12: 305 pg/mL (ref 180–914)

## 2022-05-05 LAB — VITAMIN D 25 HYDROXY (VIT D DEFICIENCY, FRACTURES): Vit D, 25-Hydroxy: 29.96 ng/mL — ABNORMAL LOW (ref 30–100)

## 2022-05-05 LAB — HIV ANTIBODY (ROUTINE TESTING W REFLEX): HIV Screen 4th Generation wRfx: NONREACTIVE

## 2022-05-05 LAB — FERRITIN: Ferritin: 460 ng/mL — ABNORMAL HIGH (ref 11–307)

## 2022-05-05 MED ORDER — POTASSIUM CHLORIDE 20 MEQ PO PACK
40.0000 meq | PACK | Freq: Two times a day (BID) | ORAL | Status: AC
  Administered 2022-05-05 (×2): 40 meq via ORAL
  Filled 2022-05-05 (×2): qty 2

## 2022-05-05 MED ORDER — INSULIN ASPART 100 UNIT/ML IJ SOLN
0.0000 [IU] | Freq: Three times a day (TID) | INTRAMUSCULAR | Status: DC
  Administered 2022-05-05 – 2022-05-06 (×2): 2 [IU] via SUBCUTANEOUS
  Administered 2022-05-06 – 2022-05-07 (×3): 3 [IU] via SUBCUTANEOUS
  Administered 2022-05-07: 2 [IU] via SUBCUTANEOUS
  Administered 2022-05-07 – 2022-05-08 (×3): 3 [IU] via SUBCUTANEOUS

## 2022-05-05 MED ORDER — LIDOCAINE 5 % EX PTCH
1.0000 | MEDICATED_PATCH | CUTANEOUS | Status: DC
  Administered 2022-05-05 – 2022-05-08 (×3): 1 via TRANSDERMAL
  Filled 2022-05-05 (×4): qty 1

## 2022-05-05 NOTE — Evaluation (Signed)
Occupational Therapy Evaluation Patient Details Name: Veronica Murray MRN: [id number] DOB: 06-20-1956 Today's Date: 05/05/2022   History of Present Illness Patient is a 66 y/o female who presents with nausea, vomiting, poor PO intake, weakness and fatigue. Admitted with acute encephalopathy secondary to dehyration from viral gastroenteritis. PMH includes CVA (4/23), DM, fibromyalgia, tobacco use disorder, carotid stenosis s/p left carotid endarterectomy 01/2022, s/p left neck exploration/patch excision of CCA and ICA repair and contruction 02/2022, infected left neck wound.   Clinical Impression   Pt states she was living with her husband, who is also on disability. At baseline, pt states she does not use an AD for mobility and is independent with ADL tasks. Unsure of reliability as pt also thinks it is 43. With encouragement, pt requires mod A for bed mobility, min A for stand step transfer to recliner @ RW level, mod A with ADL tasks with exception of total A for hygiene after toileting as pt is incontinent of urine and BM. At this time recommend rehab at [facility], however if pt clears cognitively, she may be able to progress home with [facility]. Pt states she does not want to go to SNF.Acute OT to follow.      Recommendations for follow up therapy are one component of a multi-disciplinary discharge planning process, led by the attending physician.  Recommendations may be updated based on patient status, additional functional criteria and insurance authorization.   Follow Up Recommendations  Skilled nursing-short term rehab (<3 hours/day) (pending progress)    Assistance Recommended at Discharge Frequent or constant Supervision/Assistance  Patient can return home with the following A little help with walking and/or transfers;A lot of help with bathing/dressing/bathroom;Assistance with cooking/housework;Direct supervision/assist for medications management;Direct supervision/assist for financial  management;Assist for transportation;Help with stairs or ramp for entrance    Functional Status Assessment  Patient has had a recent decline in their functional status and demonstrates the ability to make significant improvements in function in a reasonable and predictable amount of time.  Equipment Recommendations  BSC/3in1;Other (comment) (RW)    Recommendations for Other Services       Precautions / Restrictions Precautions Precautions: Fall Restrictions Weight Bearing Restrictions: No      Mobility Bed Mobility Overal bed mobility: Needs Assistance Bed Mobility: Rolling, Sidelying to Sit Rolling: Min assist Sidelying to sit: Mod assist       General bed mobility comments: initially rolled to L side in attempt to sit EOB; Pt quickly rolled back onto her back becuae of a "catch" inher L hip. On second try, bed pad used to help advance hips to EOB then transitioned upright with mod A    Transfers Overall transfer level: Needs assistance Equipment used: Rolling walker (2 wheels) Transfers: Sit to/from Stand, Bed to chair/wheelchair/BSC Sit to Stand: Min assist, +2 safety/equipment     Step pivot transfers: Min assist, +2 safety/equipment            Balance Overall balance assessment: Needs assistance, History of Falls (states she fell at home)                                         ADL either performed or assessed with clinical judgement   ADL Overall ADL's : Needs assistance/impaired Eating/Feeding: Modified independent   Grooming: Set up   Upper Body Bathing: Minimal assistance   Lower Body Bathing: Moderate assistance;Sit to/from stand  Upper Body Dressing : Minimal assistance   Lower Body Dressing: Moderate assistance;Sit to/from stand   Toilet Transfer: Minimal assistance;+2 for safety/equipment;Rolling walker (2 wheels) (stand step)   Toileting- Clothing Manipulation and Hygiene: Total assistance Toileting - Clothing  Manipulation Details (indicate cue type and reason): incontinenet     Functional mobility during ADLs: Minimal assistance;Rolling walker (2 wheels);+2 for safety/equipment General ADL Comments: poor attention during ADL tasks; Nsg reprots incontinent of BM     Vision Patient Visual Report: No change from baseline Additional Comments: will further assess     Perception Perception Comments: Hx of recentR MCA stroke   Praxis      Pertinent Vitals/Pain Pain Assessment Pain Location: chronic back pain Pain Descriptors / Indicators: Grimacing, Guarding, Discomfort, Sore, Sharp     Hand Dominance Right   Extremity/Trunk Assessment Upper Extremity Assessment Upper Extremity Assessment: Generalized weakness   Lower Extremity Assessment Lower Extremity Assessment: Defer to PT evaluation   Cervical / Trunk Assessment Cervical / Trunk Assessment: Other exceptions Cervical / Trunk Exceptions: chronic back pain   Communication Communication Communication: Other (comment) (soft spoken)   Cognition Arousal/Alertness: Lethargic Behavior During Therapy: Flat affect Overall Cognitive Status: No family/caregiver present to determine baseline cognitive functioning Area of Impairment: Memory, Attention, Following commands, Safety/judgement, Orientation, Problem solving                 Orientation Level: Disoriented to, Time, Situation Current Attention Level: Sustained Memory: Decreased short-term memory Following Commands: Follows one step commands consistently Safety/Judgement: Decreased awareness of deficits, Decreased awareness of safety   Problem Solving: Slow processing, Difficulty sequencing, Requires verbal cues General Comments: States it is June 1 of 1993 and that she is in the hospital for rehab     General Comments       Exercises     Shoulder Instructions      Home Living Family/patient expects to be discharged to:: Private residence Living Arrangements:  Spouse/significant other Available Help at Discharge: Family Type of Home: House Home Access: Level entry     Home Layout: One level     Bathroom Shower/Tub: Tub/shower unit;Walk-in shower   Bathroom Toilet: Standard Bathroom Accessibility: Yes How Accessible: Accessible via walker     Additional Comments: Pt states she lives with her spouse who is disabled      Prior Functioning/Environment Prior Level of Function : Independent/Modified Independent             Mobility Comments: Pt told PT she uses a RW; She told OT she was not using any AD.          OT Problem List: Decreased strength;Decreased activity tolerance;Impaired balance (sitting and/or standing);Decreased cognition;Decreased safety awareness;Decreased knowledge of use of DME or AE;Obesity;Pain      OT Treatment/Interventions: Self-care/ADL training;Therapeutic exercise;Energy conservation;DME and/or AE instruction;Therapeutic activities;Cognitive remediation/compensation;Patient/family education;Balance training    OT Goals(Current goals can be found in the care plan section) Acute Rehab OT Goals Patient Stated Goal: to go home and not to a SNF OT Goal Formulation: With patient Time For Goal Achievement: 05/19/22 Potential to Achieve Goals: Good  OT Frequency: Min 2X/week    Co-evaluation              AM-PAC OT "6 Clicks" Daily Activity     Outcome Measure Help from another person eating meals?: None Help from another person taking care of personal grooming?: A Little Help from another person toileting, which includes using toliet, bedpan, or urinal?: Total Help from another  person bathing (including washing, rinsing, drying)?: A Lot Help from another person to put on and taking off regular upper body clothing?: A Little Help from another person to put on and taking off regular lower body clothing?: A Lot 6 Click Score: 15   End of Session Equipment Utilized During Treatment: Gait belt;Rolling  walker (2 wheels) Nurse Communication: Mobility status  Activity Tolerance: Patient tolerated treatment well Patient left: in chair;with call bell/phone within reach;with chair alarm set  OT Visit Diagnosis: Unsteadiness on feet (R26.81);Other abnormalities of gait and mobility (R26.89);Muscle weakness (generalized) (M62.81);History of falling (Z91.81);Other symptoms and signs involving cognitive function;Pain Pain - part of body:  (back)                Time: 1610-9604 OT Time Calculation (min): 18 min Charges:  OT General Charges $OT Visit: 1 Visit OT Evaluation $OT Eval Moderate Complexity: 1 Mod  Tellis Spivak, OT/L   Acute OT Clinical Specialist Acute Rehabilitation Services Pager [phone] Office [phone]   [facility] 05/05/2022, 11:42 AM

## 2022-05-05 NOTE — Hospital Course (Addendum)
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 Hospital course is detailed below:  Delirium  Patient initially presented with concern of acute encephalopathy secondary to unknown source.  Patient did not have any notable metabolic derangements, unremarkable urinalysis, and unremarkable CT head.  Given patient's persistent nausea/vomiting that she initially presented with, suspect that viral gastroenteritis could play a part in worsening delirium/acute encephalopathy.  Patient did endorse that after her right MCA stroke in April, she reported difficulty with attention and short-term memory.  Per spouse, patient also has had some increased irritability since then.  Per spouse and patient, patient has returned to baseline status post MCA stroke.  Viral Gastroenteritis Patient persistent nausea/vomiting at beginning of hospitalization likely secondary to suspected viral gastroenteritis.  Patient w  Left Hip Pain Given patient's history of chronic fractures, active x-ray was obtained which did not show any signs of fracture but did show degenerative changes of left hip.  Type 2 Diabetes Patient normally takes 20 units of Lantus at home but given patient's poor p.o. intake, patient was discharged with recommended 10 units of Lantus daily with plan to titrate up as needed per primary care doctor.  Other chronic conditions were medically managed with home medications and formulary alternatives as necessary (Chronic L1, L5, S3 fracture c/b chronic pain and opiate use, T2DM, Normocytic Anemia) Alert and oriented to person and place. Not  time. Complains of abd and hip pain. Denies any trauma to hip.   6/28 - feeling better. Not moving bowels.

## 2022-05-06 DIAGNOSIS — R410 Disorientation, unspecified: Secondary | ICD-10-CM | POA: Diagnosis not present

## 2022-05-06 DIAGNOSIS — E871 Hypo-osmolality and hyponatremia: Secondary | ICD-10-CM

## 2022-05-06 DIAGNOSIS — I6522 Occlusion and stenosis of left carotid artery: Secondary | ICD-10-CM | POA: Diagnosis not present

## 2022-05-06 DIAGNOSIS — E860 Dehydration: Secondary | ICD-10-CM | POA: Diagnosis not present

## 2022-05-06 LAB — BASIC METABOLIC PANEL
Anion gap: 9 (ref 5–15)
BUN: 15 mg/dL (ref 8–23)
CO2: 23 mmol/L (ref 22–32)
Calcium: 8.5 mg/dL — ABNORMAL LOW (ref 8.9–10.3)
Chloride: 96 mmol/L — ABNORMAL LOW (ref 98–111)
Creatinine, Ser: 0.5 mg/dL (ref 0.44–1.00)
GFR, Estimated: >60 mL/min (ref >60)
Glucose, Bld: 144 mg/dL — ABNORMAL HIGH (ref 70–99)
Potassium: 3.8 mmol/L (ref 3.5–5.1)
Sodium: 128 mmol/L — ABNORMAL LOW (ref 135–145)

## 2022-05-06 LAB — GLUCOSE, CAPILLARY
Glucose-Capillary: 193 mg/dL — ABNORMAL HIGH (ref 70–99)
Glucose-Capillary: 157 mg/dL — ABNORMAL HIGH (ref 70–99)
Glucose-Capillary: 140 mg/dL — ABNORMAL HIGH (ref 70–99)
Glucose-Capillary: 108 mg/dL — ABNORMAL HIGH (ref 70–99)

## 2022-05-06 LAB — MAGNESIUM: Magnesium: 2 mg/dL (ref 1.7–2.4)

## 2022-05-06 MED ORDER — OXYCODONE-ACETAMINOPHEN 5-325 MG PO TABS
1.0000 | ORAL_TABLET | Freq: Three times a day (TID) | ORAL | Status: DC
  Administered 2022-05-06 – 2022-05-08 (×6): 1 via ORAL
  Filled 2022-05-06 (×7): qty 1

## 2022-05-06 NOTE — Discharge Summary (Signed)
Name: Veronica Murray MRN: [id number] DOB: [birth year] 66 y.o. PCP: [facility]  Date of Admission: 05/03/2022 10:39 PM Date of Discharge: 05/08/2022 Attending Physician: Mercie Eon, MD  Discharge Diagnosis: 1.  Acute delirium 2.  Viral gastroenteritis 3.  AKI 4.  Mild degenerative change of left hip 5.  Left carotid stenosis status post repair 6.  Chronic left carotid wound infection on extended course of Augmentin 7.  Normocytic anemia 8.  Chronic L1, L5, S3 fracture 9.  Type 2 diabetes  Discharge Medications: Allergies as of 05/08/2022       Reactions   Codeine Hives, Shortness Of Breath, Nausea Only   Toradol [ketorolac Tromethamine] Hives, Shortness Of Breath, Nausea Only   Tramadol Hcl Shortness Of Breath, Nausea Only   "Conflicts with bipolar condition"        Medication List     TAKE these medications    amoxicillin-clavulanate 875-125 MG tablet Commonly known as: AUGMENTIN Take 1 tablet by mouth 2 (two) times daily.   aspirin EC 81 MG tablet Take 1 tablet (81 mg total) by mouth daily. Swallow whole.   clopidogrel 75 MG tablet Commonly known as: PLAVIX Take 1 tablet (75 mg total) by mouth daily.   escitalopram 5 MG tablet Commonly known as: LEXAPRO Take 5 mg by mouth daily.   feeding supplement Liqd Take 237 mLs by mouth 2 (two) times daily between meals.   gabapentin 300 MG capsule Commonly known as: NEURONTIN Take 300 mg by mouth 3 (three) times daily.   hydrOXYzine 10 MG tablet Commonly known as: ATARAX Take 1 tablet (10 mg total) by mouth 3 (three) times daily as needed for anxiety or itching.   Insulin Pen Needle 30G X 8 MM Misc Commonly known as: NOVOFINE Inject 10 each into the skin as needed.   Lantus SoloStar 100 UNIT/ML Solostar Pen Generic drug: insulin glargine Inject 5 Units into the skin daily. What changed:  medication strength how much to take when to take this   multivitamin with minerals Tabs  tablet Take 1 tablet by mouth daily.   nicotine 21 mg/24hr patch Commonly known as: NICODERM CQ - dosed in mg/24 hours Place 1 patch (21 mg total) onto the skin daily.   ondansetron 4 MG tablet Commonly known as: ZOFRAN Take 1 tablet (4 mg total) by mouth every 6 (six) hours as needed for nausea.   oxyCODONE-acetaminophen 5-325 MG tablet Commonly known as: PERCOCET/ROXICET Take 1 tablet by mouth 3 (three) times daily as needed for moderate pain.   pantoprazole sodium 40 mg Commonly known as: PROTONIX Take 40 mg by mouth daily.   rosuvastatin 10 MG tablet Commonly known as: Crestor Take 1 tablet (10 mg total) by mouth daily.               Durable Medical Equipment  (From admission, onward)           Start     Ordered   05/08/22 1043  For home use only DME 4 wheeled rolling walker with seat  Once       Question:  Patient needs a walker to treat with the following condition  Answer:  Generalized weakness   05/08/22 1042   05/07/22 1347  For home use only DME 4 wheeled rolling walker with seat  Once       Question:  Patient needs a walker to treat with the following condition  Answer:  CVA (cerebral vascular accident) (HCC)   05/07/22 1346  Disposition and follow-up:   [name] was discharged from [facility] in Stable condition.  At the hospital follow up visit please address:  1.   Delirium Physical deconditioning -Can consider obtaining MOCA for baseline mental status.  It looks like patient has been declining after her last CVA. -Minimize central acting agent  -Home health PT  Viral gastroenteritis AKI -Assess resolution of GI symptoms -BMP and repletion of any electrolytes as needed  Tobacco use disorder -Please encourage smoking cessation given her history of carotid stenosis.  Type 2 diabetes A1c 8.5 2 months ago.  Her home regimen includes insulin Lantus 20 units BID.  She reports that she may have taken  up to 70 units.  She only required 5 units of long-acting Semglee during this hospitalization. -Patient will be discharged with reduced dose of the Lantus: 5 units daily. -Reassess her CBG  Left carotid stenosis status post repair Left neck wound infection -Ensure medication compliance with DAPT -Ensure that patient has been continuing Augmentin  Chronic L1, L5, S3 fractures Her vitamin D level was 29.9. -She will benefit from bisphosphonate therapy given multiple fracture -Her home Percocet was resumed  2.  Labs / imaging needed at time of follow-up: CBC, BMP, UA  3.  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 Hospital course is detailed below:  Delirium  Patient initially presented with concern of acute encephalopathy secondary to unknown source.  Patient did not have any notable metabolic derangements, unremarkable urinalysis, and unremarkable CT head.  No focal neurological deficits on physical exam.  Given patient's persistent nausea/vomiting that she initially presented with, suspect that viral gastroenteritis could play a part in worsening delirium/acute encephalopathy.  Patient did endorse that after her right MCA stroke in April, she reported difficulty with attention and short-term memory.  Her mental status returned to baseline at discharge per spouse.  Viral Gastroenteritis AKI Patient presented with  persistent nausea/vomiting, hypovolemia and dehydration at beginning of hospitalization likely secondary to suspected viral gastroenteritis.  Her husband has similar symptoms but has recovered.  CT abdomen pelvis was negative for bowel obstruction or any serious complications.  Patient was able to tolerate p.o. during this admission.  Her AKI has resolved with IV fluid and p.o. intake.  Mild degenerative change of left hip Patient endorses left hip pain 1 day after admission.  Given patient's history of chronic fractures, x-ray was obtained which did not show any signs of fracture but did show degenerative changes of left hip.  Pain was controlled with ice pack and lidocaine patch.  Her home Percocet was resumed.  Discharge Exam:   BP 126/72 (BP Location: Right Arm)   Pulse 77   Temp 98 F (36.7 C) (Oral)   Resp 16   Ht 5\' 2"  (1.575 m)   Wt 68.2 kg   SpO2 98%   BMI 27.50 kg/m  Discharge exam:  Constitutional:      Appearance: She is not toxic-appearing.  HENT:  Head: Normocephalic.  Eyes:     General:        Right eye: No discharge.        Left eye: No discharge.     Conjunctiva/sclera: Conjunctivae normal.  Cardiovascular:     Rate and Rhythm: Normal rate and regular rhythm.     Heart sounds: Normal heart sounds.  Pulmonary:     Effort: Pulmonary effort is normal. No respiratory distress.  Abdominal:     General: Bowel sounds are normal. There is no distension.     Palpations: Abdomen is soft.     Tenderness: Nontender on palpation. No guarding.  Skin:    General: Skin is warm.  Neurological:     Mental Status: She is alert.  Psychiatric:        Mood and Affect: Mood normal.   Pertinent Labs, Studies, and Procedures:     Latest Ref Rng & Units 05/05/2022    3:37 AM 05/03/2022   10:59 PM 05/03/2022   10:58 PM  CBC  WBC 4.0 - 10.5 K/uL 7.3     Hemoglobin 12.0 - 15.0 g/dL 86.7  54.4  92.0   Hematocrit 36.0 - 46.0 % 31.3  34.0  35.0   Platelets 150 - 400 K/uL 218          Latest Ref Rng & Units 05/07/2022    3:49 AM 05/06/2022   12:41 AM 05/05/2022    3:37 AM  CMP  Glucose 70 - 99 mg/dL 100  712  197   BUN 8 - 23 mg/dL 17  15  18    Creatinine 0.44 - 1.00 mg/dL  5.88  3.25   Sodium 135 - 145 mmol/L 131  128  132   Potassium 3.5 - 5.1 mmol/L 3.4  3.8  3.1   Chloride 98 - 111 mmol/L 98  96  96   CO2 22 - 32 mmol/L 24  23  26    Calcium 8.9 - 10.3 mg/dL 8.7  8.5  8.9    CT ABDOMEN PELVIS W CONTRAST  Result Date: 05/04/2022 CLINICAL DATA:  Left upper quadrant pain. Nausea, vomiting, and diarrhea for 1 week EXAM: CT ABDOMEN AND PELVIS WITH CONTRAST TECHNIQUE: Multidetector CT imaging of the abdomen and pelvis was performed using the standard protocol following bolus administration of intravenous contrast. RADIATION DOSE REDUCTION: This exam was performed according to the departmental dose-optimization program which includes automated exposure control, adjustment of the mA and/or kV according to patient size and/or use of iterative reconstruction technique. CONTRAST:  OMNIPAQUE IOHEXOL 300 MG/ML  SOLN COMPARISON:  03/07/2022 FINDINGS: Lower chest: Trace right pleural effusion, decreased from prior. Ill-defined fluid density around the lower esophagus, which itself is unremarkable in appearance. Hepatobiliary: No focal liver abnormality.Cholecystectomy without bile duct dilatation. Pancreas: Unremarkable. Spleen: Unremarkable. Adrenals/Urinary Tract: Negative adrenals. No hydronephrosis or stone. Unremarkable bladder. Stomach/Bowel:  No obstruction. No appendicitis. Vascular/Lymphatic: Extensive atheromatous calcification of the aorta and iliacs. Multifocal bilateral iliac stenosis. No mass or adenopathy. Reproductive:No pathologic findings. Other: No ascites or pneumoperitoneum. Musculoskeletal: L1 body fracture with subacute appearance. The fracture plane is still visible but there is sclerosis and indistinct margins posteriorly. Posterosuperior corner  retropulsion measuring 5 mm; height loss is mild. Remote L5 compression fracture. Cores on full fracture through the lower sacral body at the S3 level with periosteal reaction, interval. IMPRESSION: 1. Negative for bowel obstruction or visible inflammation. 2. Fluid density around the lower esophagus presumably related to history of recent vomiting vomiting. Trace  right pleural effusion, decreased from CT 2 months ago. 3. Subacute L1 body fracture which has occurred since April 2023. 5 mm of posterosuperior corner retropulsion. There is also a healing sacral insufficiency fracture which has developed since that time. Electronically Signed   By: Tiburcio Pea M.D.   On: 05/04/2022 04:56   CT Head Wo Contrast  Result Date: 05/04/2022 CLINICAL DATA:  Delirium. EXAM: CT HEAD WITHOUT CONTRAST TECHNIQUE: Contiguous axial images were obtained from the base of the skull through the vertex without intravenous contrast. RADIATION DOSE REDUCTION: This exam was performed according to the departmental dose-optimization program which includes automated exposure control, adjustment of the mA and/or kV according to patient size and/or use of iterative reconstruction technique. COMPARISON:  03/08/2022. FINDINGS: Brain: No acute intracranial hemorrhage. Stable hypodense regions in the frontal lobes bilaterally and occipital lobe on the left. No acute intracranial hemorrhage or midline shift. Mild periventricular hypodensities are present bilaterally. No hydrocephalus. Vascular: No hyperdense vessel or unexpected calcification. Skull: Normal. Negative for fracture or focal lesion. Sinuses/Orbits: No acute finding. Other: None. IMPRESSION: Stable hypodense regions in the frontal lobes bilaterally compatible with known ischemia. No hemorrhagic transformation or complicating abnormality. Electronically Signed   By: Thornell Sartorius M.D.   On: 05/04/2022 00:37   DG Chest Port 1 View  Result Date: 05/03/2022 CLINICAL DATA:   Abdominal pain and emesis EXAM: PORTABLE CHEST 1 VIEW COMPARISON:  03/03/2022 FINDINGS: Cardiac shadow is within normal limits. Lungs are well aerated bilaterally. No focal infiltrate or effusion is seen. Small calcified granuloma is noted in the left mid lung. No bony abnormality is seen. Left carotid stent is noted. IMPRESSION: No active disease. Electronically Signed   By: Alcide Clever M.D.   On: 05/03/2022 23:38     Discharge Instructions: Discharge Instructions     Call MD for:  difficulty breathing, headache or visual disturbances   Complete by: As directed    Call MD for:  persistant nausea and vomiting   Complete by: As directed    Call MD for:  redness, tenderness, or signs of infection (pain, swelling, redness, odor or green/yellow discharge around incision site)   Complete by: As directed    Call MD for:  severe uncontrolled pain   Complete by: As directed    Call MD for:  temperature >100.4   Complete by: As directed    Diet - low sodium heart healthy   Complete by: As directed    Discharge instructions   Complete by: As directed    Dear Ms. Veronica Murray,  It was a pleasure taking care of you while you are in the hospital.  You were admitted due to altered mental status with nausea and vomiting.  We suspect that the nausea and vomiting are likely secondary to a viral stomach bug.  It will take some time for you to slowly regain appetite but please continue to drink plenty of fluids as well as eat as much as you can.    Regarding your insulin, PLEASE ONLY TAKE 5 UNITS OF LANTUS DAILY. Your outpatient doctor will adjust this as you begin to eat more food.  Please continue working with home health physical therapy to regain your strength.  Take care!   Increase activity slowly   Complete by: As directed        Signed: Doran Stabler, DO 05/08/2022, 12:26 PM   Pager: @[phone] @

## 2022-05-06 NOTE — NC FL2 (Signed)
[place] MEDICAID FL2 LEVEL OF CARE SCREENING TOOL     IDENTIFICATION  Patient Name: Veronica Murray Birthdate: [birth year] Sex: female Admission Date (Current Location): 05/03/2022  [facility] and [place] Number:  Chiropodist and Address:  [facility], [address], [place], [place] [address]      Provider Number: [id number]  Attending Physician Name and Address:  Mercie Eon, MD  Relative Name and Phone Number:       Current Level of Care: Hospital Recommended Level of Care: Skilled Nursing Facility Prior Approval Number:    Date Approved/Denied:   PASRR Number: Pending  Discharge Plan: SNF    Current Diagnoses: Patient Active Problem List   Diagnosis Date Noted   Dehydration    Acute delirium 05/05/2022   Acute encephalopathy 05/05/2022   Viral gastroenteritis 05/04/2022   Hip pain 03/06/2022   CVA (cerebral vascular accident) (HCC) 03/05/2022   DM2 (diabetes mellitus, type 2) (HCC) 03/05/2022   Hypokalemia 03/05/2022   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   Normocytic anemia 03/05/2022   Leukocytosis 03/05/2022   Hyponatremia 03/05/2022   Rupture of carotid artery (HCC)    Adjustment disorder with mixed anxiety and depressed mood 01/25/2022   Carotid stenosis, left 01/24/2022   Carotid stenosis 01/18/2022   CAD (coronary artery disease) 01/18/2022   Essential hypertension 01/18/2022    Orientation RESPIRATION BLADDER Height & Weight     Self, Place  Normal Incontinent Weight: 150 lb 5.7 oz (68.2 kg) Height:  5\' 2"  (157.5 cm)  BEHAVIORAL SYMPTOMS/MOOD NEUROLOGICAL BOWEL NUTRITION STATUS      Incontinent Diet (heart healthy)  AMBULATORY STATUS COMMUNICATION OF NEEDS Skin   Extensive Assist Verbally Normal                       Personal Care Assistance Level of Assistance  Bathing, Feeding, Dressing Bathing Assistance: Limited assistance Feeding assistance: Limited assistance Dressing  Assistance: Limited assistance     Functional Limitations Info             SPECIAL CARE FACTORS FREQUENCY  PT (By licensed PT), OT (By licensed OT)     PT Frequency: 5x/wk OT Frequency: 5x/wk            Contractures Contractures Info: Not present    Additional Factors Info  Code Status, Allergies, Insulin Sliding Scale Code Status Info: Partial Allergies Info: Codeine, Toradol (Ketorolac Tromethamine), Tramadol Hcl   Insulin Sliding Scale Info: see DC summary       Current Medications (05/06/2022):  This is the current hospital active medication list Current Facility-Administered Medications  Medication Dose Route Frequency Provider Last Rate Last Admin   acetaminophen (TYLENOL) tablet 650 mg  650 mg Oral Q6H PRN Doran Stabler, DO   650 mg at 05/06/22 1315   Or   acetaminophen (TYLENOL) suppository 650 mg  650 mg Rectal Q6H PRN Doran Stabler, DO       amoxicillin-clavulanate (AUGMENTIN) 875-125 MG per tablet 1 tablet  1 tablet Oral BID Doran Stabler, DO   1 tablet at 05/06/22 8413   aspirin EC tablet 81 mg  81 mg Oral Daily Doran Stabler, DO   81 mg at 05/06/22 2440   clopidogrel (PLAVIX) tablet 75 mg  75 mg Oral Daily Doran Stabler, DO   75 mg at 05/06/22 0807   enoxaparin (LOVENOX) injection 40 mg  40 mg Subcutaneous Q24H Doran Stabler, DO   40 mg at 05/06/22 [phone]  insulin aspart (novoLOG) injection 0-15 Units  0-15 Units Subcutaneous TID WC Doran Stabler, DO   3 Units at 05/06/22 1209   insulin glargine-yfgn (SEMGLEE) injection 5 Units  5 Units Subcutaneous QHS Doran Stabler, DO   5 Units at 05/05/22 2111   lidocaine (LIDODERM) 5 % 1 patch  1 patch Transdermal Q24H Adron Bene, MD   1 patch at 05/06/22 1209   nicotine (NICODERM CQ - dosed in mg/24 hours) patch 21 mg  21 mg Transdermal Daily Doran Stabler, DO   21 mg at 05/06/22 0806   ondansetron (ZOFRAN) tablet 4 mg  4 mg Oral Q6H PRN Doran Stabler, DO       Or   ondansetron [facility]) injection 4 mg  4 mg Intravenous Q6H  PRN Doran Stabler, DO   4 mg at 05/05/22 2017   oxyCODONE-acetaminophen (PERCOCET/ROXICET) 5-325 MG per tablet 1 tablet  1 tablet Oral BID Doran Stabler, DO   1 tablet at 05/06/22 1610   rosuvastatin (CRESTOR) tablet 10 mg  10 mg Oral Daily Doran Stabler, DO   10 mg at 05/06/22 9604     Discharge Medications: Please see discharge summary for a list of discharge medications.  Relevant Imaging Results:  Relevant Lab Results:   Additional Information SS#: [id number]  Baldemar Lenis, LCSW

## 2022-05-07 DIAGNOSIS — R410 Disorientation, unspecified: Secondary | ICD-10-CM | POA: Diagnosis not present

## 2022-05-07 DIAGNOSIS — E119 Type 2 diabetes mellitus without complications: Secondary | ICD-10-CM

## 2022-05-07 DIAGNOSIS — Z794 Long term (current) use of insulin: Secondary | ICD-10-CM

## 2022-05-07 DIAGNOSIS — G9340 Encephalopathy, unspecified: Secondary | ICD-10-CM

## 2022-05-07 DIAGNOSIS — E860 Dehydration: Secondary | ICD-10-CM | POA: Diagnosis not present

## 2022-05-07 LAB — BASIC METABOLIC PANEL
Anion gap: 9 (ref 5–15)
BUN: 17 mg/dL (ref 8–23)
CO2: 24 mmol/L (ref 22–32)
Calcium: 8.7 mg/dL — ABNORMAL LOW (ref 8.9–10.3)
Chloride: 98 mmol/L (ref 98–111)
Creatinine, Ser: 0.48 mg/dL (ref 0.44–1.00)
GFR, Estimated: >60 mL/min (ref >60)
Glucose, Bld: 115 mg/dL — ABNORMAL HIGH (ref 70–99)
Potassium: 3.4 mmol/L — ABNORMAL LOW (ref 3.5–5.1)
Sodium: 131 mmol/L — ABNORMAL LOW (ref 135–145)

## 2022-05-07 LAB — GLUCOSE, CAPILLARY
Glucose-Capillary: 128 mg/dL — ABNORMAL HIGH (ref 70–99)
Glucose-Capillary: 163 mg/dL — ABNORMAL HIGH (ref 70–99)
Glucose-Capillary: 167 mg/dL — ABNORMAL HIGH (ref 70–99)
Glucose-Capillary: 176 mg/dL — ABNORMAL HIGH (ref 70–99)

## 2022-05-07 LAB — MAGNESIUM: Magnesium: 1.9 mg/dL (ref 1.7–2.4)

## 2022-05-07 MED ORDER — METHOCARBAMOL 500 MG PO TABS
500.0000 mg | ORAL_TABLET | Freq: Once | ORAL | Status: AC | PRN
Start: 2022-05-07 — End: 2022-05-07
  Administered 2022-05-07: 500 mg via ORAL
  Filled 2022-05-07: qty 1

## 2022-05-07 MED ORDER — POTASSIUM CHLORIDE CRYS ER 20 MEQ PO TBCR
40.0000 meq | EXTENDED_RELEASE_TABLET | Freq: Once | ORAL | Status: AC
  Administered 2022-05-07: 40 meq via ORAL
  Filled 2022-05-07: qty 2

## 2022-05-08 DIAGNOSIS — I6522 Occlusion and stenosis of left carotid artery: Secondary | ICD-10-CM | POA: Diagnosis not present

## 2022-05-08 DIAGNOSIS — R410 Disorientation, unspecified: Secondary | ICD-10-CM | POA: Diagnosis not present

## 2022-05-08 DIAGNOSIS — E860 Dehydration: Secondary | ICD-10-CM | POA: Diagnosis not present

## 2022-05-08 DIAGNOSIS — E119 Type 2 diabetes mellitus without complications: Secondary | ICD-10-CM | POA: Diagnosis not present

## 2022-05-08 LAB — GLUCOSE, CAPILLARY
Glucose-Capillary: 162 mg/dL — ABNORMAL HIGH (ref 70–99)
Glucose-Capillary: 161 mg/dL — ABNORMAL HIGH (ref 70–99)

## 2022-05-08 MED ORDER — LANTUS SOLOSTAR 100 UNIT/ML ~~LOC~~ SOPN
5.0000 [IU] | PEN_INJECTOR | Freq: Every day | SUBCUTANEOUS | 0 refills | Status: AC
Start: 2022-05-08 — End: ?

## 2022-05-08 MED ORDER — ONDANSETRON HCL 4 MG PO TABS: 4.0000 mg | ORAL_TABLET | Freq: Four times a day (QID) | ORAL | 0 refills | Status: DC | PRN

## 2022-05-08 MED ORDER — BISACODYL 5 MG PO TBEC
5.0000 mg | DELAYED_RELEASE_TABLET | Freq: Once | ORAL | Status: AC
Start: 2022-05-08 — End: 2022-05-08
  Administered 2022-05-08: 5 mg via ORAL
  Filled 2022-05-08: qty 1

## 2022-05-08 NOTE — Progress Notes (Signed)
Physical Therapy Treatment Patient Details Name: Veronica Murray MRN: [id number] DOB: Apr 29, 1956 Today's Date: 05/08/2022   History of Present Illness Patient is a 66 y/o female who presents with nausea, vomiting, poor PO intake, weakness and fatigue. Admitted with acute encephalopathy secondary to dehyration from viral gastroenteritis. PMH includes CVA (4/23), DM, fibromyalgia, tobacco use disorder, carotid stenosis s/p left carotid endarterectomy 01/2022, s/p left neck exploration/patch excision of CCA and ICA repair and contruction 02/2022, infected left neck wound.    PT Comments    Pt progressing well with mobility today. She required max encouragement, verbal cues, and increased time but no physical assist for mobility. Min guard assist bed mobility, transfers, and ambulation 25' with RW. Pt with primary c/o back pain. Pt declined sitting up in recliner at end of session, returning to bed. RN present in room to administer morning meds, including pain meds. Discharge recommendation updated to HHPT.    Recommendations for follow up therapy are one component of a multi-disciplinary discharge planning process, led by the attending physician.  Recommendations may be updated based on patient status, additional functional criteria and insurance authorization.  Follow Up Recommendations  Home health PT Can patient physically be transported by private vehicle: Yes   Assistance Recommended at Discharge Frequent or constant Supervision/Assistance  Patient can return home with the following Assistance with cooking/housework;Assist for transportation;Help with stairs or ramp for entrance;Direct supervision/assist for medications management;Direct supervision/assist for financial management;A little help with bathing/dressing/bathroom;A little help with walking and/or transfers   Equipment Recommendations  None recommended by PT    Recommendations for Other Services       Precautions /  Restrictions Precautions Precautions: Fall Precaution Comments: Self limiting     Mobility  Bed Mobility Overal bed mobility: Needs Assistance Bed Mobility: Sidelying to Sit, Sit to Sidelying, Rolling Rolling: Modified independent (Device/Increase time) Sidelying to sit: Min guard, HOB elevated     Sit to sidelying: Min guard General bed mobility comments: +rail, increased time, cues for sequencing, no physical assist but max encouragement needed    Transfers Overall transfer level: Needs assistance Equipment used: Rolling walker (2 wheels) Transfers: Sit to/from Stand Sit to Stand: Min guard           General transfer comment: cues for hand placement and sequencing, increased time, max encouragement, no physical assist    Ambulation/Gait Ambulation/Gait assistance: Min guard Gait Distance (Feet): 25 Feet Assistive device: Rolling walker (2 wheels) Gait Pattern/deviations: Step-through pattern, Decreased stride length Gait velocity: very slow Gait velocity interpretation: <1.8 ft/sec, indicate of risk for recurrent falls   General Gait Details: min guard for safety, increased time, max encouragement. Pt screaming "oh god" multiple times during session. She reports this was in response to pain. Moving very slowly but no knee buckling or LOB noted.   Stairs             Wheelchair Mobility    Modified Rankin (Stroke Patients Only)       Balance Overall balance assessment: Needs assistance, History of Falls Sitting-balance support: Feet supported, No upper extremity supported Sitting balance-Leahy Scale: Fair     Standing balance support: Bilateral upper extremity supported, Reliant on assistive device for balance, During functional activity Standing balance-Leahy Scale: Poor                              Cognition Arousal/Alertness: Awake/alert Behavior During Therapy: Flat affect Overall Cognitive Status: No family/caregiver present  to  determine baseline cognitive functioning Area of Impairment: Memory, Problem solving, Safety/judgement, Attention, Following commands                   Current Attention Level: Sustained Memory: Decreased short-term memory Following Commands: Follows one step commands consistently, Follows one step commands with increased time Safety/Judgement: Decreased awareness of deficits, Decreased awareness of safety   Problem Solving: Slow processing, Difficulty sequencing, Requires verbal cues          Exercises      General Comments General comments (skin integrity, edema, etc.): VSS on RA      Pertinent Vitals/Pain Pain Assessment Pain Assessment: 0-10 Pain Score: 10-Worst pain ever Pain Location: chronic back pain Pain Descriptors / Indicators: Grimacing, Guarding, Moaning Pain Intervention(s): Monitored during session, Repositioned, Limited activity within patient's tolerance, RN gave pain meds during session, Heat applied    Home Living                          Prior Function            PT Goals (current goals can now be found in the care plan section) Acute Rehab PT Goals Patient Stated Goal: home Progress towards PT goals: Progressing toward goals    Frequency    Min 3X/week      PT Plan Discharge plan needs to be updated    Co-evaluation              AM-PAC PT "6 Clicks" Mobility   Outcome Measure  Help needed turning from your back to your side while in a flat bed without using bedrails?: None Help needed moving from lying on your back to sitting on the side of a flat bed without using bedrails?: A Little Help needed moving to and from a bed to a chair (including a wheelchair)?: A Little Help needed standing up from a chair using your arms (e.g., wheelchair or bedside chair)?: A Little Help needed to walk in hospital room?: A Little Help needed climbing 3-5 steps with a railing? : A Lot 6 Click Score: 18    End of Session  Equipment Utilized During Treatment: Gait belt Activity Tolerance: Patient limited by pain Patient left: in bed;with call bell/phone within reach Nurse Communication: Mobility status PT Visit Diagnosis: Pain;Muscle weakness (generalized) (M62.81);Difficulty in walking, not elsewhere classified (R26.2)     Time: 0174-9449 PT Time Calculation (min) (ACUTE ONLY): 24 min  Charges:  $Gait Training: 23-37 mins                     Aida Raider, [place]  Office # [phone] Pager [phone]    Ilda Foil 05/08/2022, 10:17 AM

## 2022-05-08 NOTE — TOC Transition Note (Signed)
Transition of Care [facility]) - CM/SW Discharge Note   Patient Details  Name: Veronica Murray MRN: [id number] Date of Birth: [birth year]  Transition of Care [facility]) CM/SW Contact:  Baldemar Lenis, LCSW Phone Number: 05/08/2022, 2:01 PM   Clinical Narrative:   CSW alerted by MD that patient felt to have capacity to make decisions, still refusing SNF. PT evaluated today and cleared for home with home health. CSW ordered walker with seat per husband's request to assist with ambulation, and discussed home health. Husband has no preference for home health provider. CSW gave referral to [facility] who can accept, information placed on AVS. No other needs identified at this time.    Final next level of care: Home w Home Health Services Barriers to Discharge: Barriers Resolved   Patient Goals and CMS Choice Patient states their goals for this hospitalization and ongoing recovery are:: "I want to return home" CMS Medicare.gov Compare Post Acute Care list provided to:: Patient Represenative (must comment) Choice offered to / list presented to : Spouse  Discharge Placement                Patient to be transferred to facility by: Family Name of family member notified: [name] Patient and family notified of of transfer: 05/08/22  Discharge Plan and Services In-house Referral: Clinical Social Work              DME Arranged: Dan Humphreys rolling with seat DME Agency: [employer] Date DME Agency Contacted: 05/08/22   Representative spoke with at DME Agency: Arnold Long HH Arranged: PT HH Agency: [facility] Date [facility] Agency Contacted: 05/08/22   Representative spoke with at [facility] Agency: Candise Bowens  Social Determinants of Health (SDOH) Interventions     Readmission Risk Interventions     No data to display

## 2022-05-08 NOTE — Care Management Important Message (Signed)
Important Message  Patient Details  Name: Veronica Murray MRN: [id number] Date of Birth: January 30, 1956   Medicare Important Message Given:  Yes     Dorena Bodo 05/08/2022, 2:36 PM

## 2022-05-08 NOTE — TOC Progression Note (Addendum)
Transition of Care [facility]) - Progression Note    Patient Details  Name: Veronica Murray MRN: [id number] Date of Birth: 1956/02/22  Transition of Care [facility]) CM/SW Contact  Baldemar Lenis, [place] Phone Number: 05/08/2022, 11:38 AM  Clinical Narrative:   CSW noting per chart review that patient only oriented to person and place, not able to make decisions at this time. CSW contacted patient's spouse via phone to discuss recommendation for SNF. Spouse indicated that the patient would refuse, and CSW explained patient's confusion and deficits and asked if spouse could handle her at home. Spouse said he would not be able to assist at home with his own disability so agrees with SNF recommendation, asking for [facility]. CSW contacted [facility] to see if they can accept, awaiting response. CSW to follow.    Expected Discharge Plan: Skilled Nursing Facility Barriers to Discharge: Continued Medical Work up, English as a second language teacher  Expected Discharge Plan and Services Expected Discharge Plan: Skilled Nursing Facility In-house Referral: Clinical Social Work     Living arrangements for the past 2 months: Single Family Home                                       Social Determinants of Health (SDOH) Interventions    Readmission Risk Interventions     No data to display

## 2022-05-13 ENCOUNTER — Emergency Department (HOSPITAL_COMMUNITY): Payer: Medicare HMO

## 2022-05-13 ENCOUNTER — Other Ambulatory Visit: Payer: Self-pay

## 2022-05-13 ENCOUNTER — Emergency Department (HOSPITAL_COMMUNITY)
Admission: EM | Admit: 2022-05-13 | Discharge: 2022-05-15 | Disposition: A | Discharge status: Skilled Nursing Facility | Payer: Medicare HMO | Attending: Emergency Medicine | Admitting: Emergency Medicine

## 2022-05-13 ENCOUNTER — Encounter (HOSPITAL_COMMUNITY): Payer: Self-pay

## 2022-05-13 DIAGNOSIS — Z794 Long term (current) use of insulin: Secondary | ICD-10-CM | POA: Insufficient documentation

## 2022-05-13 DIAGNOSIS — M25552 Pain in left hip: Secondary | ICD-10-CM | POA: Diagnosis present

## 2022-05-13 DIAGNOSIS — N390 Urinary tract infection, site not specified: Secondary | ICD-10-CM

## 2022-05-13 DIAGNOSIS — Z7902 Long term (current) use of antithrombotics/antiplatelets: Secondary | ICD-10-CM | POA: Diagnosis not present

## 2022-05-13 DIAGNOSIS — R4182 Altered mental status, unspecified: Secondary | ICD-10-CM | POA: Diagnosis not present

## 2022-05-13 HISTORY — DX: Cerebral infarction, unspecified: I63.9

## 2022-05-13 HISTORY — DX: Encephalopathy, unspecified: G93.40

## 2022-05-13 LAB — URINALYSIS, ROUTINE W REFLEX MICROSCOPIC
Bilirubin Urine: NEGATIVE
Glucose, UA: 150 mg/dL — AB
Hgb urine dipstick: NEGATIVE
Ketones, ur: NEGATIVE mg/dL
Nitrite: NEGATIVE
Protein, ur: NEGATIVE mg/dL
Specific Gravity, Urine: 1.008 (ref 1.005–1.030)
pH: 6 (ref 5.0–8.0)

## 2022-05-13 LAB — COMPREHENSIVE METABOLIC PANEL
ALT: 19 U/L (ref 0–44)
AST: 17 U/L (ref 15–41)
Albumin: 3.6 g/dL (ref 3.5–5.0)
Alkaline Phosphatase: 182 U/L — ABNORMAL HIGH (ref 38–126)
Anion gap: 11 (ref 5–15)
BUN: 12 mg/dL (ref 8–23)
CO2: 28 mmol/L (ref 22–32)
Calcium: 9.4 mg/dL (ref 8.9–10.3)
Chloride: 94 mmol/L — ABNORMAL LOW (ref 98–111)
Creatinine, Ser: 0.59 mg/dL (ref 0.44–1.00)
GFR, Estimated: >60 mL/min (ref >60)
Glucose, Bld: 251 mg/dL — ABNORMAL HIGH (ref 70–99)
Potassium: 3.5 mmol/L (ref 3.5–5.1)
Sodium: 133 mmol/L — ABNORMAL LOW (ref 135–145)
Total Bilirubin: 0.5 mg/dL (ref 0.3–1.2)
Total Protein: 7.4 g/dL (ref 6.5–8.1)

## 2022-05-13 LAB — TROPONIN I (HIGH SENSITIVITY): Troponin I (High Sensitivity): 7 ng/L (ref <18)

## 2022-05-13 LAB — CBC
HCT: 34.8 % — ABNORMAL LOW (ref 36.0–46.0)
Hemoglobin: 12.2 g/dL (ref 12.0–15.0)
MCH: 30.7 pg (ref 26.0–34.0)
MCHC: 35.1 g/dL (ref 30.0–36.0)
MCV: 87.4 fL (ref 80.0–100.0)
Platelets: 412 10*3/uL — ABNORMAL HIGH (ref 150–400)
RBC: 3.98 MIL/uL (ref 3.87–5.11)
RDW: 12.7 % (ref 11.5–15.5)
WBC: 9 10*3/uL (ref 4.0–10.5)
nRBC: 0 % (ref 0.0–0.2)

## 2022-05-13 LAB — CBG MONITORING, ED: Glucose-Capillary: 267 mg/dL — ABNORMAL HIGH (ref 70–99)

## 2022-05-13 MED ORDER — HYDROXYZINE HCL 10 MG PO TABS
10.0000 mg | ORAL_TABLET | Freq: Three times a day (TID) | ORAL | Status: DC | PRN
  Administered 2022-05-13 – 2022-05-14 (×2): 10 mg via ORAL
  Filled 2022-05-13 (×2): qty 1

## 2022-05-13 MED ORDER — ESCITALOPRAM OXALATE 10 MG PO TABS
5.0000 mg | ORAL_TABLET | Freq: Every day | ORAL | Status: DC
  Administered 2022-05-13 – 2022-05-15 (×3): 5 mg via ORAL
  Filled 2022-05-13 (×3): qty 1

## 2022-05-13 MED ORDER — NICOTINE 21 MG/24HR TD PT24
21.0000 mg | MEDICATED_PATCH | Freq: Every day | TRANSDERMAL | Status: DC
Start: 2022-05-13 — End: 2022-05-15
  Administered 2022-05-13 – 2022-05-15 (×3): 21 mg via TRANSDERMAL
  Filled 2022-05-13 (×3): qty 1

## 2022-05-13 MED ORDER — INSULIN GLARGINE-YFGN 100 UNIT/ML ~~LOC~~ SOLN
5.0000 [IU] | Freq: Every day | SUBCUTANEOUS | Status: DC
  Administered 2022-05-14 – 2022-05-15 (×2): 5 [IU] via SUBCUTANEOUS
  Filled 2022-05-13 (×2): qty 0.05

## 2022-05-13 MED ORDER — ADULT MULTIVITAMIN W/MINERALS CH
1.0000 | ORAL_TABLET | Freq: Every day | ORAL | Status: DC
  Administered 2022-05-13 – 2022-05-15 (×3): 1 via ORAL
  Filled 2022-05-13 (×3): qty 1

## 2022-05-13 MED ORDER — PANTOPRAZOLE SODIUM 40 MG PO TBEC
40.0000 mg | DELAYED_RELEASE_TABLET | Freq: Every day | ORAL | Status: DC
  Administered 2022-05-14 – 2022-05-15 (×2): 40 mg via ORAL
  Filled 2022-05-13 (×2): qty 1

## 2022-05-13 MED ORDER — ONDANSETRON HCL 4 MG PO TABS: 4.0000 mg | ORAL_TABLET | Freq: Four times a day (QID) | ORAL | Status: DC | PRN

## 2022-05-13 MED ORDER — ROSUVASTATIN CALCIUM 20 MG PO TABS
10.0000 mg | ORAL_TABLET | Freq: Every day | ORAL | Status: DC
  Administered 2022-05-13 – 2022-05-15 (×3): 10 mg via ORAL
  Filled 2022-05-13 (×3): qty 1

## 2022-05-13 MED ORDER — OXYCODONE-ACETAMINOPHEN 5-325 MG PO TABS
1.0000 | ORAL_TABLET | Freq: Three times a day (TID) | ORAL | Status: DC | PRN
  Administered 2022-05-13 – 2022-05-15 (×5): 1 via ORAL
  Filled 2022-05-13 (×5): qty 1

## 2022-05-13 MED ORDER — GABAPENTIN 300 MG PO CAPS
300.0000 mg | ORAL_CAPSULE | Freq: Three times a day (TID) | ORAL | Status: DC
  Administered 2022-05-13 – 2022-05-15 (×7): 300 mg via ORAL
  Filled 2022-05-13 (×7): qty 1

## 2022-05-13 MED ORDER — CLOPIDOGREL BISULFATE 75 MG PO TABS
75.0000 mg | ORAL_TABLET | Freq: Every day | ORAL | Status: DC
  Administered 2022-05-14 – 2022-05-15 (×2): 75 mg via ORAL
  Filled 2022-05-13 (×2): qty 1

## 2022-05-13 MED ORDER — CEFDINIR 300 MG PO CAPS
300.0000 mg | ORAL_CAPSULE | Freq: Two times a day (BID) | ORAL | Status: DC
Start: 2022-05-13 — End: 2022-05-15
  Administered 2022-05-13 – 2022-05-15 (×4): 300 mg via ORAL
  Filled 2022-05-13 (×5): qty 1

## 2022-05-13 NOTE — Progress Notes (Signed)
Transition of Care [facility]) - Emergency Department Mini Assessment   Patient Details  Name: Veronica Murray MRN: [id number] Date of Birth: 27-May-1956  Transition of Care [facility]) CM/SW Contact:    Lavenia Atlas, RN Phone Number: 05/13/2022, 5:33 PM   Clinical Narrative: Patient presents to [facility] via EMS for fall 2 weeks ago. RNCM received TOC consult for HHC/ SNF.     ED Mini Assessment:  RNCM spoke with patient at bedside regarding EDP referral for [facility] services. Per chart review patient has HHC services accepted by [facility] 05/08/22. Patient reports she no longer wants to [facility] she wants to be placed in SNF. This RNCM notified EDP who advised currently patient does not meet admission criteria. This RNCM explained criteria for SNF placement, need 3 midnights. RNCM advised patient she would need to contact her PCP for SNF placement. Patient states " I know I'm going open up a can... we have bed bugs at home and my home situation is not good right now. We don't know what to do." Can you call my husband to have him give me a call tonight."  RNCM called patient's husband Veronica Murray, left voicemail message for return call. Patient reports she can not go back home, however can go stay with her daughter in [place] however she doesn't have her daughter's phone number. Patient reports her husband was working to get them a hotel to stay at.    RNCM notified EDP and RN.    TOC will continue to follow.   Patient Contact and Communications  ALANYS, GODINO (Spouse)  [phone] (Mobile)   Daughter: Veronica Murray phone number unknown at this time.     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 [place] [address] Phone: [phone] Fax: [phone]

## 2022-05-13 NOTE — [place] FL2 (Signed)
[place] MEDICAID FL2 LEVEL OF CARE SCREENING TOOL     IDENTIFICATION  Patient Name: Veronica Murray Birthdate: September 06, 1956 Sex: female Admission Date (Current Location): 05/13/2022  [facility] and [place] Number:  Producer, television/film/video and Address:  [facility],  [address] [place], [place] [address]      Provider Number: [id number]  Attending Physician Name and Address:  Default, Provider, MD  Relative Name and Phone Number:  Melah Ebling [phone] Husband    Current Level of Care: Hospital Recommended Level of Care: Skilled Nursing Facility Prior Approval Number:    Date Approved/Denied:   PASRR Number: [id number]  Discharge Plan: SNF    Current Diagnoses: Patient Active Problem List   Diagnosis Date Noted   Dehydration    Acute delirium 05/05/2022   Acute encephalopathy 05/05/2022   Viral gastroenteritis 05/04/2022   Hip pain 03/06/2022   CVA (cerebral vascular accident) (HCC) 03/05/2022   DM2 (diabetes mellitus, type 2) (HCC) 03/05/2022   Hypokalemia 03/05/2022   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   Normocytic anemia 03/05/2022   Leukocytosis 03/05/2022   Hyponatremia 03/05/2022   Rupture of carotid artery (HCC)    Adjustment disorder with mixed anxiety and depressed mood 01/25/2022   Carotid stenosis, left 01/24/2022   Carotid stenosis 01/18/2022   CAD (coronary artery disease) 01/18/2022   Essential hypertension 01/18/2022    Orientation RESPIRATION BLADDER Height & Weight     Self, Time, Situation, Place  Normal Incontinent Weight: 150 lb 5.7 oz (68.2 kg) Height:  5\' 2"  (157.5 cm)  BEHAVIORAL SYMPTOMS/MOOD NEUROLOGICAL BOWEL NUTRITION STATUS      Incontinent Diet (Heart Healthy)  AMBULATORY STATUS COMMUNICATION OF NEEDS Skin   Limited Assist   Normal                       Personal Care Assistance Level of Assistance  Bathing, Feeding, Dressing Bathing Assistance: Limited assistance Feeding assistance: Limited  assistance Dressing Assistance: Limited assistance     Functional Limitations Info  Sight, Hearing, Speech Sight Info: Adequate Hearing Info: Adequate Speech Info: Adequate    SPECIAL CARE FACTORS FREQUENCY        PT Frequency: 5X week OT Frequency: 5X week            Contractures Contractures Info: Not present    Additional Factors Info  Code Status Code Status Info: Partial Code Allergies Info: Codeine High  Hives, Shortness Of Breath, Nausea Only   Toradol (ketorolac Tromethamine) High  Hives, Shortness Of Breath, Nausea Only   Tramadol Hcl           Current Medications (05/13/2022):  This is the current hospital active medication list Current Facility-Administered Medications  Medication Dose Route Frequency Provider Last Rate Last Admin   cefdinir (OMNICEF) capsule 300 mg  300 mg Oral Q12H 07/14/2022, MD   300 mg at 05/13/22 1842   clopidogrel (PLAVIX) tablet 75 mg  75 mg Oral Daily 07/14/22, MD       escitalopram Lorre Nick) tablet 5 mg  5 mg Oral Daily Judye Bos, MD   5 mg at 05/13/22 1843   gabapentin (NEURONTIN) capsule 300 mg  300 mg Oral TID 07/14/22, MD   300 mg at 05/13/22 1843   hydrOXYzine (ATARAX) tablet 10 mg  10 mg Oral TID PRN 07/14/22, MD   10 mg at 05/13/22 1843   insulin glargine-yfgn (SEMGLEE) injection 5 Units  5 Units Subcutaneous Daily 07/14/22,  MD       multivitamin with minerals tablet 1 tablet  1 tablet Oral Daily Lorre Nick, MD   1 tablet at 05/13/22 1843   nicotine (NICODERM CQ - dosed in mg/24 hours) patch 21 mg  21 mg Transdermal Daily Lorre Nick, MD   21 mg at 05/13/22 1842   ondansetron (ZOFRAN) tablet 4 mg  4 mg Oral Q6H PRN Lorre Nick, MD       oxyCODONE-acetaminophen (PERCOCET/ROXICET) 5-325 MG per tablet 1 tablet  1 tablet Oral TID PRN Lorre Nick, MD   1 tablet at 05/13/22 1843   pantoprazole sodium (PROTONIX) 40 mg  40 mg Oral Daily Lorre Nick, MD       rosuvastatin (CRESTOR) tablet  10 mg  10 mg Oral Daily Lorre Nick, MD   10 mg at 05/13/22 1843   Current Outpatient Medications  Medication Sig Dispense Refill   amoxicillin-clavulanate (AUGMENTIN) 875-125 MG tablet Take 1 tablet by mouth 2 (two) times daily. 60 tablet 1   aspirin EC 81 MG EC tablet Take 1 tablet (81 mg total) by mouth daily. Swallow whole. (Patient not taking: Reported on 05/04/2022) 30 tablet 11   clopidogrel (PLAVIX) 75 MG tablet Take 1 tablet (75 mg total) by mouth daily.     escitalopram (LEXAPRO) 5 MG tablet Take 5 mg by mouth daily.     feeding supplement (ENSURE ENLIVE / ENSURE PLUS) LIQD Take 237 mLs by mouth 2 (two) times daily between meals. (Patient not taking: Reported on 05/04/2022) 237 mL 12   gabapentin (NEURONTIN) 300 MG capsule Take 300 mg by mouth 3 (three) times daily.     hydrOXYzine (ATARAX) 10 MG tablet Take 1 tablet (10 mg total) by mouth 3 (three) times daily as needed for anxiety or itching. 30 tablet 0   insulin glargine (LANTUS SOLOSTAR) 100 UNIT/ML Solostar Pen Inject 5 Units into the skin daily. 3 mL 0   Insulin Pen Needle (NOVOFINE) 30G X 8 MM MISC Inject 10 each into the skin as needed. 100 each 0   Multiple Vitamin (MULTIVITAMIN WITH MINERALS) TABS tablet Take 1 tablet by mouth daily.     nicotine (NICODERM CQ - DOSED IN MG/24 HOURS) 21 mg/24hr patch Place 1 patch (21 mg total) onto the skin daily. 28 patch 0   ondansetron (ZOFRAN) 4 MG tablet Take 1 tablet (4 mg total) by mouth every 6 (six) hours as needed for nausea. 20 tablet 0   oxyCODONE-acetaminophen (PERCOCET/ROXICET) 5-325 MG tablet Take 1 tablet by mouth 3 (three) times daily as needed for moderate pain.     pantoprazole sodium (PROTONIX) 40 mg Take 40 mg by mouth daily.     rosuvastatin (CRESTOR) 10 MG tablet Take 1 tablet (10 mg total) by mouth daily. 30 tablet 5     Discharge Medications: Please see discharge summary for a list of discharge medications.  Relevant Imaging Results:  Relevant Lab  Results:   Additional Information [id number]  Jannette Spanner Jenai Scaletta, LCSW

## 2022-05-13 NOTE — ED Notes (Signed)
Patient transported to CT 

## 2022-05-13 NOTE — Progress Notes (Signed)
CSW talk to Pt and husband, Pt is really needing SNF per Husband request. Pt will get a PT evaluation in the am and TOC will send out new FL2 that is completed in Pt chart.

## 2022-05-13 NOTE — ED Triage Notes (Addendum)
Per EMS- patient had a fall 2 weeks ago and was seen at [facility] ED for the same when fall occurred. Patient states that she  was prescribed Percocet with no relief. Patient c/o right hip pain. Patient told EMS that she has not urinated or had a BM since the fall occurred.

## 2022-05-13 NOTE — ED Provider Triage Note (Signed)
Emergency Medicine Provider Triage Evaluation Note  Veronica Murray , a 66 y.o. female  was evaluated in triage.  Patient with a past medical history of acute encephalopathy, acute delirium, pt complains of right hip pain.  Patient fell 2 weeks ago and was evaluated at [facility] emergency department for similar symptoms.  Patient notes she was sent a prescription for Percocet without relief of her symptoms.  She notified EMS that she has not urinated or had a bowel movement since the fall occurred.  When asked patient about this patient notes that she has not had a bowel movement in 2 weeks also notes that she has not urinated in 2 weeks but notes that she can dribble which is all that she can do.  Patient also notes chest pain, shortness of breath.  Review of Systems  Positive: As per HPI Negative:   Physical Exam  BP 115/77 (BP Location: Left Arm)   Pulse 99   Temp 98 F (36.7 C) (Oral)   Resp 14   Ht 5\' 2"  (1.575 m)   Wt 68.2 kg   SpO2 96%   BMI 27.50 kg/m  Gen:   Awake, no distress   Resp:  Normal effort  MSK:   Moves extremities without difficulty  Other:  Strength and sensation intact to bilateral upper and lower extremities.  No tenderness to palpation noted to right hip.  No tenderness to palpation noted with flexion or extension of right hip.  Medical Decision Making  Medically screening exam initiated at 1:30 PM.  Appropriate orders placed.  was informed that the remainder of the evaluation will be completed by another provider, this initial triage assessment does not replace that evaluation, and the importance of remaining in the ED until their evaluation is complete.  Patient not aware of the year and knows that it is 90.  When asked patient which hip is bothering her she notes her right hip however points to the left hip.  When corrected, patient continues to point to the left hip and note that it is her right hip.  Review of chart denies any history of  dementia.  However patient was recently admitted to the hospital for acute encephalopathy and acute delirium.   1:47 PM - Discussed with RN that patient is in need of a room immediately. RN aware and working on room placement. Work-up initiated.   Dontrel Smethers A, PA-C 05/13/22 1347

## 2022-05-13 NOTE — ED Notes (Signed)
Pt was given additional ice chips and water

## 2022-05-13 NOTE — ED Provider Notes (Signed)
[facility]-EMERGENCY DEPT Provider Note   CSN: [id number] Arrival date & time: 05/13/22  1247     History  Chief Complaint  Patient presents with   Hip Pain    Veronica Murray is a 66 y.o. female.  66 year old female who presents with trouble walking x2 weeks.  Patient was seen for similar symptoms of dehydration.  Patient states that she has not had any falls.  Pain has been in her left hip.  Review of the record shows that she was discharged recently and she was recommended for SNF placement which she deferred.  States that her husband has had trouble taking care of her at home.  Patient states that she has not had a bowel movement in 2 weeks but denies any emesis.  sHe denies any urinary symptoms at this time.       Home Medications Prior to Admission medications   Medication Sig Start Date End Date Taking? Authorizing Provider  amoxicillin-clavulanate (AUGMENTIN) 875-125 MG tablet Take 1 tablet by mouth 2 (two) times daily. 04/02/22   Lynn Ito, MD  aspirin EC 81 MG EC tablet Take 1 tablet (81 mg total) by mouth daily. Swallow whole. Patient not taking: Reported on 05/04/2022 01/27/22   Bertram Denver, MD  clopidogrel (PLAVIX) 75 MG tablet Take 1 tablet (75 mg total) by mouth daily. 03/16/22   Arnetha Courser, MD  escitalopram (LEXAPRO) 5 MG tablet Take 5 mg by mouth daily. 03/18/22   [provider]  feeding supplement (ENSURE ENLIVE / ENSURE PLUS) LIQD Take 237 mLs by mouth 2 (two) times daily between meals. Patient not taking: Reported on 05/04/2022 03/16/22   Arnetha Courser, MD  gabapentin (NEURONTIN) 300 MG capsule Take 300 mg by mouth 3 (three) times daily. 02/01/22   [provider]  hydrOXYzine (ATARAX) 10 MG tablet Take 1 tablet (10 mg total) by mouth 3 (three) times daily as needed for anxiety or itching. 03/15/22   Arnetha Courser, MD  insulin glargine (LANTUS SOLOSTAR) 100 UNIT/ML Solostar Pen Inject 5 Units into the skin daily.  05/08/22   Doran Stabler, DO  Insulin Pen Needle (NOVOFINE) 30G X 8 MM MISC Inject 10 each into the skin as needed. 06/26/19   Milagros Loll, MD  Multiple Vitamin (MULTIVITAMIN WITH MINERALS) TABS tablet Take 1 tablet by mouth daily. 03/16/22   Arnetha Courser, MD  nicotine (NICODERM CQ - DOSED IN MG/24 HOURS) 21 mg/24hr patch Place 1 patch (21 mg total) onto the skin daily. 03/16/22   Arnetha Courser, MD  ondansetron (ZOFRAN) 4 MG tablet Take 1 tablet (4 mg total) by mouth every 6 (six) hours as needed for nausea. 05/08/22   Doran Stabler, DO  oxyCODONE-acetaminophen (PERCOCET/ROXICET) 5-325 MG tablet Take 1 tablet by mouth 3 (three) times daily as needed for moderate pain. 04/16/22   [provider]  pantoprazole sodium (PROTONIX) 40 mg Take 40 mg by mouth daily. 03/15/22   Arnetha Courser, MD  rosuvastatin (CRESTOR) 10 MG tablet Take 1 tablet (10 mg total) by mouth daily. 01/18/22   [name], MD      Allergies    Codeine, Toradol [ketorolac tromethamine], and Tramadol hcl    Review of Systems   Review of Systems  All other systems reviewed and are negative.   Physical Exam Updated Vital Signs BP (!) 120/92   Pulse 86   Temp 98 F (36.7 C) (Oral)   Resp 19   Ht 1.575 m (5\' 2" )  Wt 68.2 kg   SpO2 98%   BMI 27.50 kg/m  Physical Exam Vitals and nursing note reviewed.  Constitutional:      General: She is not in acute distress.    Appearance: Normal appearance. She is well-developed. She is not toxic-appearing.  HENT:     Head: Normocephalic and atraumatic.  Eyes:     General: Lids are normal.     Conjunctiva/sclera: Conjunctivae normal.     Pupils: Pupils are equal, round, and reactive to light.  Neck:     Thyroid: No thyroid mass.     Trachea: No tracheal deviation.  Cardiovascular:     Rate and Rhythm: Normal rate and regular rhythm.     Heart sounds: Normal heart sounds. No murmur heard.    No gallop.  Pulmonary:     Effort: Pulmonary effort is normal. No respiratory  distress.     Breath sounds: Normal breath sounds. No stridor. No decreased breath sounds, wheezing, rhonchi or rales.  Abdominal:     General: There is no distension.     Palpations: Abdomen is soft.     Tenderness: There is no abdominal tenderness. There is no rebound.  Musculoskeletal:        General: No tenderness. Normal range of motion.     Cervical back: Normal range of motion and neck supple.     Comments: No hip shortening or rotation appreciated.  Strength is 5/5 in upper as well as lower extremities.  Skin:    General: Skin is warm and dry.     Findings: No abrasion or rash.  Neurological:     Mental Status: She is alert and oriented to person, place, and time. Mental status is at baseline.     GCS: GCS eye subscore is 4. GCS verbal subscore is 5. GCS motor subscore is 6.     Cranial Nerves: No cranial nerve deficit.     Sensory: No sensory deficit.     Motor: Motor function is intact.  Psychiatric:        Attention and Perception: Attention normal.        Speech: Speech normal.        Behavior: Behavior normal.     ED Results / Procedures / Treatments   Labs (all labs ordered are listed, but only abnormal results are displayed) Labs Reviewed  COMPREHENSIVE METABOLIC PANEL - Abnormal; Notable for the following components:      Result Value   Sodium 133 (*)    Chloride 94 (*)    Glucose, Bld 251 (*)    Alkaline Phosphatase 182 (*)    All other components within normal limits  CBC - Abnormal; Notable for the following components:   HCT 34.8 (*)    Platelets 412 (*)    All other components within normal limits  URINALYSIS, ROUTINE W REFLEX MICROSCOPIC - Abnormal; Notable for the following components:   APPearance HAZY (*)    Glucose, UA 150 (*)    Leukocytes,Ua SMALL (*)    Bacteria, UA RARE (*)    All other components within normal limits  CBG MONITORING, ED - Abnormal; Notable for the following components:   Glucose-Capillary 267 (*)    All other components  within normal limits  TROPONIN I (HIGH SENSITIVITY)  TROPONIN I (HIGH SENSITIVITY)    EKG EKG Interpretation  Date/Time:  Monday May 13 2022 14:26:10 EDT Ventricular Rate:  84 PR Interval:  170 QRS Duration: 98 QT Interval:  374 QTC Calculation:  443 R Axis:   24 Text Interpretation: Sinus rhythm Probable lateral infarct, old Confirmed by Lorre Nick ([id number]) on 05/13/2022 5:34:30 PM  Radiology CT Head Wo Contrast  Result Date: 05/13/2022 CLINICAL DATA:  Mental status change, unknown cause EXAM: CT HEAD WITHOUT CONTRAST TECHNIQUE: Contiguous axial images were obtained from the base of the skull through the vertex without intravenous contrast. RADIATION DOSE REDUCTION: This exam was performed according to the departmental dose-optimization program which includes automated exposure control, adjustment of the mA and/or kV according to patient size and/or use of iterative reconstruction technique. COMPARISON:  CT head May 04, 2022. FINDINGS: Brain: Similar bilateral MCA territory infarcts with similar associated petechial hemorrhage involving the dominant infarcts in bilateral frontal lobes. No progressive mass effect or new/interval acute hemorrhage. No definite interval acute large vascular territory infarct. No midline shift, hydrocephalus or mass lesion. Vascular: No hyperdense vessel identified. Calcific intracranial atherosclerosis. Skull: No acute fracture. Sinuses/Orbits: Clear sinuses. Other: No mastoid effusions. IMPRESSION: 1. Similar bilateral MCA territory infarcts with similar associated petechial hemorrhage involving the dominant infarcts in bilateral frontal lobes. No progressive mass effect or new/interval acute hemorrhage. 2. No definite interval acute large vascular territory infarct. MRI could provide more sensitive evaluation if clinically warranted Electronically Signed   By: Feliberto Harts M.D.   On: 05/13/2022 14:22    Procedures Procedures    Medications Ordered in  ED Medications - No data to display  ED Course/ Medical Decision Making/ A&P                           Medical Decision Making Amount and/or Complexity of Data Reviewed Labs: ordered. Radiology: ordered.  Risk OTC drugs. Prescription drug management.   Patient is EKG per my interpretation shows normal sinus rhythm.  No signs of acute ischemic changes.  Patient did state earlier that she had some confusion and had a head CT which per interpretation showed no new findings from her prior CT.  Complain of having left hip pain and CT of left hip was negative for fracture.  Patient had complained of having trouble with bowel movements and acute abdominal series shows no signs of severe constipation or obstruction.  That was per my review and interpretation.  Patient's urinalysis does show some infection will place on antibiotics.  Patient had vague chest discomfort complaints which did not sound cardiac and troponin was negative.  The troponin was done several hours after her initial symptoms.  Low suspicion for ACS.  After prolonged conversation with patient, her main issue is that her husband cannot take care of her at home.  I have consulted transitions of care.  Physical therapy consult has been placed.  Patient is comfortable with home resources if possible.  Holding orders have been placed        Final Clinical Impression(s) / ED Diagnoses Final diagnoses:  None    Rx / DC Orders ED Discharge Orders     None         Lorre Nick, MD 05/13/22 1736

## 2022-05-13 NOTE — ED Notes (Signed)
Pt resting and watching TV, NAD noted, even RR and unlabored, call bell within reach for assistance, side rails up x2 for safety, pt voiced no concerns or questions at this time, care on going, will continue to monitor.  

## 2022-05-14 LAB — CBG MONITORING, ED: Glucose-Capillary: 186 mg/dL — ABNORMAL HIGH (ref 70–99)

## 2022-05-14 NOTE — ED Notes (Signed)
Pt was reposition in bed for comfort, purwick readjusted, diaper placed on pt for purwick support and for moisture control. Pt pulled up in bed as she had slide down, blankets reapplied to pr, lights turned off for comfort, pt has side rails up x2 for safety and call bell in reach

## 2022-05-14 NOTE — Evaluation (Signed)
Physical Therapy Evaluation Patient Details Name: Veronica Murray MRN: [id number] DOB: 1956/01/17 Today's Date: 05/14/2022  History of Present Illness  Patient is a 66 y/o female who presents with pain, inability to ambulate. In [facility] after a fall , DC'd home 05/08/22. PMH includes CVA (4/23), DM, fibromyalgia, tobacco use disorder, carotid stenosis s/p left carotid endarterectomy 01/2022, s/p left neck exploration/patch excision of CCA and ICA repair and contruction 02/2022, infected left neck wound.  Clinical Impression  The patient reports that she has not been able to ambulate  since leaving [facility] last week. Patient reports spouse has been assisting Veronica Murray  with mobility, not clear on how she managed as patient did not tolerate  sitting on bed edge this visit. Spouse not present  for information. Per PT notes from last week, patient  did not ambulate during admission. Pt admitted with above diagnosis.  Pt currently with functional limitations due to the deficits listed below (see PT Problem List). Pt will benefit from skilled PT to increase their independence and safety with mobility to allow discharge to the venue listed below.        Recommendations for follow up therapy are one component of a multi-disciplinary discharge planning process, led by the attending physician.  Recommendations may be updated based on patient status, additional functional criteria and insurance authorization.  Follow Up Recommendations Skilled nursing-short term rehab (<3 hours/day) Can patient physically be transported by private vehicle: No    Assistance Recommended at Discharge Frequent or constant Supervision/Assistance  Patient can return home with the following  Assistance with cooking/housework;Assist for transportation;Help with stairs or ramp for entrance;Direct supervision/assist for medications management;Direct supervision/assist for financial management;A little help with bathing/dressing/bathroom;Two people to  help with walking and/or transfers    Equipment Recommendations None recommended by PT  Recommendations for Other Services       Functional Status Assessment Patient has had a recent decline in their functional status and demonstrates the ability to make significant improvements in function in a reasonable and predictable amount of time.     Precautions / Restrictions Precautions Precautions: Fall      Mobility  Bed Mobility   Bed Mobility: Rolling, Sidelying to Sit, Sit to Sidelying Rolling: Mod assist Sidelying to sit: Total assist     Sit to sidelying: Total assist General bed mobility comments: attempted to  sit up, assisted legs over bed edge. Patient resisting when tryunk being elevated and stated" I can't". attempted x 2 to sit up on bed edge without success.    Transfers                        Ambulation/Gait                  Stairs            Wheelchair Mobility    Modified Rankin (Stroke Patients Only)       Balance                                             Pertinent Vitals/Pain Pain Assessment Faces Pain Scale: Hurts whole lot Pain Location: chronic back pain, left hip Pain Descriptors / Indicators: Grimacing, Guarding, Moaning Pain Intervention(s): Monitored during session, Repositioned, Limited activity within patient's tolerance    Home Living Family/patient expects to be discharged to:: Private residence Living Arrangements:  Spouse/significant other Available Help at Discharge: Family;Available 24 hours/day Type of Home: House Home Access: Level entry       Home Layout: One level Home Equipment: Rollator (4 wheels);BSC/3in1 Additional Comments: states spouse has been assisting Veronica Murray OOB and rolls on rollator.    Prior Function               Mobility Comments: unsure , patient not clear       Hand Dominance        Extremity/Trunk Assessment   Upper Extremity Assessment Upper  Extremity Assessment: Overall WFL for tasks assessed    Lower Extremity Assessment Lower Extremity Assessment: RLE deficits/detail;LLE deficits/detail RLE Deficits / Details: activeley flexing  hip and  kne in supine LLE Deficits / Details: actively flexing and extending L hip and knee in supine freely    Cervical / Trunk Assessment Cervical / Trunk Assessment: Other exceptions Cervical / Trunk Exceptions: unable to assusme sitting position  Communication      Cognition Arousal/Alertness: Awake/alert Behavior During Therapy: Flat affect Overall Cognitive Status: No family/caregiver present to determine baseline cognitive functioning Area of Impairment: Memory, Problem solving, Safety/judgement, Attention, Following commands                 Orientation Level: Disoriented to, Time, Situation Current Attention Level: Sustained Memory: Decreased short-term memory Following Commands: Follows one step commands consistently, Follows one step commands with increased time       General Comments: Poor insight to current situation        General Comments      Exercises     Assessment/Plan    PT Assessment Patient needs continued PT services  PT Problem List Decreased strength;Decreased range of motion;Decreased mobility;Decreased safety awareness;Decreased balance;Pain;Decreased cognition;Decreased activity tolerance       PT Treatment Interventions Therapeutic activities;Gait training;Therapeutic exercise;Patient/family education;Balance training;Functional mobility training;DME instruction;Cognitive remediation    PT Goals (Current goals can be found in the Care Plan section)  Acute Rehab PT Goals Patient Stated Goal: rehab PT Goal Formulation: Patient unable to participate in goal setting Time For Goal Achievement: 05/28/22 Potential to Achieve Goals: Fair    Frequency Min 2X/week     Co-evaluation               AM-PAC PT "6 Clicks" Mobility  Outcome  Measure Help needed turning from your back to your side while in a flat bed without using bedrails?: Total Help needed moving from lying on your back to sitting on the side of a flat bed without using bedrails?: Total Help needed moving to and from a bed to a chair (including a wheelchair)?: Total Help needed standing up from a chair using your arms (e.g., wheelchair or bedside chair)?: Total Help needed to walk in hospital room?: Total Help needed climbing 3-5 steps with a railing? : Total 6 Click Score: 6    End of Session   Activity Tolerance: Patient limited by pain Patient left: in bed;with call bell/phone within reach Nurse Communication: Mobility status;Need for lift equipment PT Visit Diagnosis: Pain;Muscle weakness (generalized) (M62.81);Difficulty in walking, not elsewhere classified (R26.2) Pain - Right/Left: Left Pain - part of body: Hip    Time: 6759-1638 PT Time Calculation (min) (ACUTE ONLY): 23 min   Charges:   PT Evaluation $PT Eval Low Complexity: 1 Low PT Treatments $Therapeutic Activity: 8-22 mins        Blanchard Kelch PT Acute Rehabilitation Services Office [phone] Weekend pager-[phone]   Rada Hay 05/14/2022, 3:07  PM

## 2022-05-14 NOTE — ED Notes (Signed)
Pt resting and watching TV, NAD noted, even RR and unlabored, call bell within reach for assistance, side rails up x2 for safety, pt voiced no concerns or questions at this time, care on going, will continue to monitor.  

## 2022-05-14 NOTE — NC FL2 (Signed)
[place] MEDICAID FL2 LEVEL OF CARE SCREENING TOOL     IDENTIFICATION  Patient Name: Veronica Murray Birthdate: 1956/06/02 Sex: female Admission Date (Current Location): 05/13/2022  [facility] and [place] Number:  Producer, television/film/video and Address:  [facility],  [address] [place], [place] [address]      Provider Number: [id number]  Attending Physician Name and Address:  Default, Provider, MD  Relative Name and Phone Number:  Alegria Dominique [phone] Husband    Current Level of Care: Hospital Recommended Level of Care: Skilled Nursing Facility Prior Approval Number:    Date Approved/Denied:   PASRR Number: [id number]  Discharge Plan: SNF    Current Diagnoses: Patient Active Problem List   Diagnosis Date Noted   Dehydration    Acute delirium 05/05/2022   Acute encephalopathy 05/05/2022   Viral gastroenteritis 05/04/2022   Hip pain 03/06/2022   CVA (cerebral vascular accident) (HCC) 03/05/2022   DM2 (diabetes mellitus, type 2) (HCC) 03/05/2022   Hypokalemia 03/05/2022   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   Normocytic anemia 03/05/2022   Leukocytosis 03/05/2022   Hyponatremia 03/05/2022   Rupture of carotid artery (HCC)    Adjustment disorder with mixed anxiety and depressed mood 01/25/2022   Carotid stenosis, left 01/24/2022   Carotid stenosis 01/18/2022   CAD (coronary artery disease) 01/18/2022   Essential hypertension 01/18/2022    Orientation RESPIRATION BLADDER Height & Weight     Self, Time, Situation, Place  Normal Incontinent Weight: 150 lb 5.7 oz (68.2 kg) Height:  5\' 2"  (157.5 cm)  BEHAVIORAL SYMPTOMS/MOOD NEUROLOGICAL BOWEL NUTRITION STATUS      Incontinent Diet (Heart Healthy)  AMBULATORY STATUS COMMUNICATION OF NEEDS Skin   Limited Assist   Normal                       Personal Care Assistance Level of Assistance  Bathing, Feeding, Dressing Bathing Assistance: Limited assistance Feeding assistance: Limited  assistance Dressing Assistance: Limited assistance     Functional Limitations Info  Sight, Hearing, Speech Sight Info: Adequate Hearing Info: Adequate Speech Info: Adequate    SPECIAL CARE FACTORS FREQUENCY        PT Frequency: 5X week OT Frequency: 5X week            Contractures Contractures Info: Not present    Additional Factors Info  Code Status Code Status Info: Partial Code Allergies Info: Codeine High  Hives, Shortness Of Breath, Nausea Only   Toradol (ketorolac Tromethamine) High  Hives, Shortness Of Breath, Nausea Only   Tramadol Hcl           Current Medications (05/14/2022):  This is the current hospital active medication list Current Facility-Administered Medications  Medication Dose Route Frequency Provider Last Rate Last Admin   cefdinir (OMNICEF) capsule 300 mg  300 mg Oral Q12H 07/15/2022, MD   300 mg at 05/14/22 1001   clopidogrel (PLAVIX) tablet 75 mg  75 mg Oral Daily 07/15/22, MD   75 mg at 05/14/22 1002   escitalopram (LEXAPRO) tablet 5 mg  5 mg Oral Daily 07/15/22, MD   5 mg at 05/14/22 1001   gabapentin (NEURONTIN) capsule 300 mg  300 mg Oral TID 07/15/22, MD   300 mg at 05/14/22 1550   hydrOXYzine (ATARAX) tablet 10 mg  10 mg Oral TID PRN 07/15/22, MD   10 mg at 05/14/22 0222   insulin glargine-yfgn (SEMGLEE) injection 5 Units  5 Units Subcutaneous  Daily Lorre Nick, MD   5 Units at 05/14/22 1005   multivitamin with minerals tablet 1 tablet  1 tablet Oral Daily Lorre Nick, MD   1 tablet at 05/14/22 1002   nicotine (NICODERM CQ - dosed in mg/24 hours) patch 21 mg  21 mg Transdermal Daily Lorre Nick, MD   21 mg at 05/14/22 1004   ondansetron (ZOFRAN) tablet 4 mg  4 mg Oral Q6H PRN Lorre Nick, MD       oxyCODONE-acetaminophen (PERCOCET/ROXICET) 5-325 MG per tablet 1 tablet  1 tablet Oral TID PRN Lorre Nick, MD   1 tablet at 05/14/22 0223   pantoprazole (PROTONIX) EC tablet 40 mg  40 mg Oral Daily Lorre Nick, MD   40 mg at 05/14/22 1002   rosuvastatin (CRESTOR) tablet 10 mg  10 mg Oral Daily Lorre Nick, MD   10 mg at 05/14/22 1001   Current Outpatient Medications  Medication Sig Dispense Refill   amoxicillin-clavulanate (AUGMENTIN) 875-125 MG tablet Take 1 tablet by mouth 2 (two) times daily. 60 tablet 1   DULoxetine (CYMBALTA) 30 MG capsule Take 30 mg by mouth at bedtime.     gabapentin (NEURONTIN) 300 MG capsule Take 300 mg by mouth 3 (three) times daily.     insulin glargine (LANTUS SOLOSTAR) 100 UNIT/ML Solostar Pen Inject 5 Units into the skin daily. 3 mL 0   ondansetron (ZOFRAN) 4 MG tablet Take 1 tablet (4 mg total) by mouth every 6 (six) hours as needed for nausea. (Patient taking differently: Take 4 mg by mouth daily as needed for nausea or vomiting.) 20 tablet 0   oxyCODONE-acetaminophen (PERCOCET/ROXICET) 5-325 MG tablet Take 1 tablet by mouth 4 (four) times daily as needed for moderate pain (pain).     pantoprazole sodium (PROTONIX) 40 mg Take 40 mg by mouth daily.     rosuvastatin (CRESTOR) 10 MG tablet Take 1 tablet (10 mg total) by mouth daily. 30 tablet 5   aspirin EC 81 MG EC tablet Take 1 tablet (81 mg total) by mouth daily. Swallow whole. (Patient not taking: Reported on 05/14/2022) 30 tablet 11   clopidogrel (PLAVIX) 75 MG tablet Take 1 tablet (75 mg total) by mouth daily.     escitalopram (LEXAPRO) 5 MG tablet Take 5 mg by mouth daily. (Patient not taking: Reported on 05/14/2022)     feeding supplement (ENSURE ENLIVE / ENSURE PLUS) LIQD Take 237 mLs by mouth 2 (two) times daily between meals. (Patient not taking: Reported on 05/04/2022) 237 mL 12   hydrOXYzine (ATARAX) 10 MG tablet Take 1 tablet (10 mg total) by mouth 3 (three) times daily as needed for anxiety or itching. (Patient not taking: Reported on 05/14/2022) 30 tablet 0   Insulin Pen Needle (NOVOFINE) 30G X 8 MM MISC Inject 10 each into the skin as needed. 100 each 0   Multiple Vitamin (MULTIVITAMIN WITH MINERALS)  TABS tablet Take 1 tablet by mouth daily. (Patient not taking: Reported on 05/14/2022)     nicotine (NICODERM CQ - DOSED IN MG/24 HOURS) 21 mg/24hr patch Place 1 patch (21 mg total) onto the skin daily. (Patient not taking: Reported on 05/14/2022) 28 patch 0     Discharge Medications: Please see discharge summary for a list of discharge medications.  Relevant Imaging Results:  Relevant Lab Results:   Additional Information [id number]  Jannette Spanner Pilar Westergaard, LCSW

## 2022-05-14 NOTE — ED Notes (Signed)
Pt appears to be sleeping, even RR and unlabored, NAD noted, call bell in reach, side rails up x2 for safety, care on going, will continue to monitor. 

## 2022-05-14 NOTE — Progress Notes (Signed)
FL2 sent out for Pt. TOC will continue to update.

## 2022-05-14 NOTE — ED Notes (Signed)
Pt now in border status, pt awaiting consult for [facility] team

## 2022-05-15 LAB — CBG MONITORING, ED: Glucose-Capillary: 179 mg/dL — ABNORMAL HIGH (ref 70–99)

## 2022-05-15 MED ORDER — CEFDINIR 300 MG PO CAPS: 300.0000 mg | ORAL_CAPSULE | Freq: Two times a day (BID) | ORAL | 0 refills | Status: AC

## 2022-05-15 NOTE — Progress Notes (Signed)
CSW presented bed offer to pt and spouse, pt and family chose [employer]. CSW submitted Auth . Auth pending.   Valentina Shaggy.Keltie Labell, MSW, LCSWA [facility]  Transitions of Care Clinical Social Worker I Direct Dial: [phone]  Fax: [phone] [email or web address]

## 2022-05-15 NOTE — Discharge Instructions (Signed)
Return for any problem.   Complete course of Omnicef (Cefdinir) as prescribed.

## 2022-05-15 NOTE — Progress Notes (Signed)
Pt's insurance Auth has been approved. Pt to d/c to [facility] place room 305 A, call report to [phone]. MD and RN made aware.  Valentina Shaggy.Virgil Lightner, MSW, LCSWA [facility]  Transitions of Care Clinical Social Worker I Direct Dial: [phone]  Fax: [phone] [email or web address]

## 2022-06-03 ENCOUNTER — Emergency Department (HOSPITAL_COMMUNITY): Payer: Medicare HMO

## 2022-06-03 ENCOUNTER — Telehealth: Payer: Self-pay | Admitting: Neurology

## 2022-06-03 ENCOUNTER — Encounter (HOSPITAL_COMMUNITY): Payer: Self-pay | Admitting: Emergency Medicine

## 2022-06-03 ENCOUNTER — Telehealth: Payer: Self-pay | Admitting: *Deleted

## 2022-06-03 ENCOUNTER — Inpatient Hospital Stay (HOSPITAL_COMMUNITY)
Admission: EM | Admit: 2022-06-03 | Discharge: 2022-06-06 | DRG: 100 | Disposition: A | Discharge status: Home / Self Care | Payer: Medicare HMO | Attending: Internal Medicine | Admitting: Internal Medicine

## 2022-06-03 ENCOUNTER — Observation Stay (HOSPITAL_COMMUNITY): Payer: Medicare HMO

## 2022-06-03 ENCOUNTER — Ambulatory Visit (INDEPENDENT_AMBULATORY_CARE_PROVIDER_SITE_OTHER): Payer: Self-pay | Admitting: Neurology

## 2022-06-03 ENCOUNTER — Other Ambulatory Visit: Payer: Self-pay

## 2022-06-03 VITALS — Ht 62.0 in

## 2022-06-03 DIAGNOSIS — R569 Unspecified convulsions: Secondary | ICD-10-CM | POA: Diagnosis not present

## 2022-06-03 DIAGNOSIS — Z79899 Other long term (current) drug therapy: Secondary | ICD-10-CM

## 2022-06-03 DIAGNOSIS — Z7902 Long term (current) use of antithrombotics/antiplatelets: Secondary | ICD-10-CM

## 2022-06-03 DIAGNOSIS — M797 Fibromyalgia: Secondary | ICD-10-CM | POA: Diagnosis present

## 2022-06-03 DIAGNOSIS — R4182 Altered mental status, unspecified: Secondary | ICD-10-CM | POA: Diagnosis not present

## 2022-06-03 DIAGNOSIS — M1612 Unilateral primary osteoarthritis, left hip: Secondary | ICD-10-CM | POA: Diagnosis present

## 2022-06-03 DIAGNOSIS — N390 Urinary tract infection, site not specified: Secondary | ICD-10-CM

## 2022-06-03 DIAGNOSIS — G9341 Metabolic encephalopathy: Secondary | ICD-10-CM | POA: Diagnosis present

## 2022-06-03 DIAGNOSIS — S3210XA Unspecified fracture of sacrum, initial encounter for closed fracture: Secondary | ICD-10-CM | POA: Diagnosis present

## 2022-06-03 DIAGNOSIS — I10 Essential (primary) hypertension: Secondary | ICD-10-CM | POA: Diagnosis present

## 2022-06-03 DIAGNOSIS — Z794 Long term (current) use of insulin: Secondary | ICD-10-CM

## 2022-06-03 DIAGNOSIS — Z885 Allergy status to narcotic agent status: Secondary | ICD-10-CM

## 2022-06-03 DIAGNOSIS — I959 Hypotension, unspecified: Secondary | ICD-10-CM

## 2022-06-03 DIAGNOSIS — E1165 Type 2 diabetes mellitus with hyperglycemia: Secondary | ICD-10-CM | POA: Diagnosis present

## 2022-06-03 DIAGNOSIS — Z7401 Bed confinement status: Secondary | ICD-10-CM

## 2022-06-03 DIAGNOSIS — E860 Dehydration: Secondary | ICD-10-CM | POA: Diagnosis present

## 2022-06-03 DIAGNOSIS — Z87891 Personal history of nicotine dependence: Secondary | ICD-10-CM

## 2022-06-03 DIAGNOSIS — M25552 Pain in left hip: Secondary | ICD-10-CM

## 2022-06-03 DIAGNOSIS — R55 Syncope and collapse: Secondary | ICD-10-CM | POA: Diagnosis present

## 2022-06-03 DIAGNOSIS — Z7982 Long term (current) use of aspirin: Secondary | ICD-10-CM

## 2022-06-03 DIAGNOSIS — W19XXXA Unspecified fall, initial encounter: Secondary | ICD-10-CM | POA: Diagnosis present

## 2022-06-03 DIAGNOSIS — R410 Disorientation, unspecified: Secondary | ICD-10-CM

## 2022-06-03 DIAGNOSIS — Z9049 Acquired absence of other specified parts of digestive tract: Secondary | ICD-10-CM

## 2022-06-03 DIAGNOSIS — Z8673 Personal history of transient ischemic attack (TIA), and cerebral infarction without residual deficits: Secondary | ICD-10-CM

## 2022-06-03 DIAGNOSIS — Z95828 Presence of other vascular implants and grafts: Secondary | ICD-10-CM

## 2022-06-03 LAB — I-STAT CHEM 8, ED
BUN: 32 mg/dL — ABNORMAL HIGH (ref 8–23)
Calcium, Ion: 1.14 mmol/L — ABNORMAL LOW (ref 1.15–1.40)
Chloride: 92 mmol/L — ABNORMAL LOW (ref 98–111)
Creatinine, Ser: 0.6 mg/dL (ref 0.44–1.00)
Glucose, Bld: 302 mg/dL — ABNORMAL HIGH (ref 70–99)
HCT: 41 % (ref 36.0–46.0)
Hemoglobin: 13.9 g/dL (ref 12.0–15.0)
Potassium: 4 mmol/L (ref 3.5–5.1)
Sodium: 130 mmol/L — ABNORMAL LOW (ref 135–145)
TCO2: 25 mmol/L (ref 22–32)

## 2022-06-03 LAB — URINALYSIS, ROUTINE W REFLEX MICROSCOPIC
Bilirubin Urine: NEGATIVE
Glucose, UA: 50 mg/dL — AB
Ketones, ur: NEGATIVE mg/dL
Nitrite: NEGATIVE
Protein, ur: 30 mg/dL — AB
Specific Gravity, Urine: 1.019 (ref 1.005–1.030)
WBC, UA: >50 WBC/hpf — ABNORMAL HIGH (ref 0–5)
pH: 5 (ref 5.0–8.0)

## 2022-06-03 LAB — RAPID URINE DRUG SCREEN, HOSP PERFORMED
Amphetamines: NOT DETECTED
Barbiturates: NOT DETECTED
Benzodiazepines: NOT DETECTED
Cocaine: NOT DETECTED
Opiates: NOT DETECTED
Tetrahydrocannabinol: NOT DETECTED

## 2022-06-03 LAB — COMPREHENSIVE METABOLIC PANEL WITH GFR
ALT: 27 U/L (ref 0–44)
AST: 28 U/L (ref 15–41)
Albumin: 3.9 g/dL (ref 3.5–5.0)
Alkaline Phosphatase: 158 U/L — ABNORMAL HIGH (ref 38–126)
Anion gap: 15 (ref 5–15)
BUN: 32 mg/dL — ABNORMAL HIGH (ref 8–23)
CO2: 24 mmol/L (ref 22–32)
Calcium: 9.9 mg/dL (ref 8.9–10.3)
Chloride: 92 mmol/L — ABNORMAL LOW (ref 98–111)
Creatinine, Ser: 0.89 mg/dL (ref 0.44–1.00)
GFR, Estimated: >60 mL/min
Glucose, Bld: 308 mg/dL — ABNORMAL HIGH (ref 70–99)
Potassium: 4 mmol/L (ref 3.5–5.1)
Sodium: 131 mmol/L — ABNORMAL LOW (ref 135–145)
Total Bilirubin: 0.7 mg/dL (ref 0.3–1.2)
Total Protein: 7.5 g/dL (ref 6.5–8.1)

## 2022-06-03 LAB — CBC WITH DIFFERENTIAL/PLATELET
Abs Immature Granulocytes: 0.04 10*3/uL (ref 0.00–0.07)
Basophils Absolute: 0 10*3/uL (ref 0.0–0.1)
Basophils Relative: 0 %
Eosinophils Absolute: 0 10*3/uL (ref 0.0–0.5)
Eosinophils Relative: 0 %
HCT: 37.6 % (ref 36.0–46.0)
Hemoglobin: 13.3 g/dL (ref 12.0–15.0)
Immature Granulocytes: 1 %
Lymphocytes Relative: 21 %
Lymphs Abs: 1.6 10*3/uL (ref 0.7–4.0)
MCH: 30.6 pg (ref 26.0–34.0)
MCHC: 35.4 g/dL (ref 30.0–36.0)
MCV: 86.4 fL (ref 80.0–100.0)
Monocytes Absolute: 0.4 10*3/uL (ref 0.1–1.0)
Monocytes Relative: 5 %
Neutro Abs: 5.3 10*3/uL (ref 1.7–7.7)
Neutrophils Relative %: 73 %
Platelets: 311 10*3/uL (ref 150–400)
RBC: 4.35 MIL/uL (ref 3.87–5.11)
RDW: 12.8 % (ref 11.5–15.5)
WBC: 7.4 10*3/uL (ref 4.0–10.5)
nRBC: 0 % (ref 0.0–0.2)

## 2022-06-03 LAB — TROPONIN I (HIGH SENSITIVITY)
Troponin I (High Sensitivity): 16 ng/L
Troponin I (High Sensitivity): 13 ng/L

## 2022-06-03 LAB — MAGNESIUM: Magnesium: 1.9 mg/dL (ref 1.7–2.4)

## 2022-06-03 MED ORDER — OXYCODONE HCL 5 MG PO TABS
5.0000 mg | ORAL_TABLET | Freq: Four times a day (QID) | ORAL | Status: DC | PRN
  Administered 2022-06-03 – 2022-06-06 (×6): 5 mg via ORAL
  Filled 2022-06-03 (×6): qty 1

## 2022-06-03 MED ORDER — SODIUM CHLORIDE 0.9 % IV SOLN
1.0000 g | INTRAVENOUS | Status: DC
  Administered 2022-06-03 – 2022-06-04 (×2): 1 g via INTRAVENOUS
  Filled 2022-06-03 (×2): qty 10

## 2022-06-03 MED ORDER — ORAL CARE MOUTH RINSE
15.0000 mL | OROMUCOSAL | Status: DC
  Administered 2022-06-03 – 2022-06-05 (×6): 15 mL via OROMUCOSAL

## 2022-06-03 MED ORDER — FOLIC ACID 1 MG PO TABS
1.0000 mg | ORAL_TABLET | Freq: Every day | ORAL | Status: DC
Start: 2022-06-03 — End: 2022-06-06
  Administered 2022-06-03 – 2022-06-06 (×4): 1 mg via ORAL
  Filled 2022-06-03 (×4): qty 1

## 2022-06-03 MED ORDER — ROSUVASTATIN CALCIUM 5 MG PO TABS
10.0000 mg | ORAL_TABLET | Freq: Every day | ORAL | Status: DC
  Administered 2022-06-04 – 2022-06-06 (×3): 10 mg via ORAL
  Filled 2022-06-03 (×3): qty 2

## 2022-06-03 MED ORDER — PANTOPRAZOLE SODIUM 40 MG PO TBEC
40.0000 mg | DELAYED_RELEASE_TABLET | Freq: Every day | ORAL | Status: DC
  Administered 2022-06-04 – 2022-06-06 (×3): 40 mg via ORAL
  Filled 2022-06-03 (×3): qty 1

## 2022-06-03 MED ORDER — DULOXETINE HCL 30 MG PO CPEP
30.0000 mg | ORAL_CAPSULE | Freq: Every day | ORAL | Status: DC
  Administered 2022-06-03 – 2022-06-04 (×2): 30 mg via ORAL
  Filled 2022-06-03: qty 1

## 2022-06-03 MED ORDER — DULOXETINE HCL 30 MG PO CPEP
ORAL_CAPSULE | ORAL | Status: AC
  Filled 2022-06-03: qty 1

## 2022-06-03 MED ORDER — ADULT MULTIVITAMIN W/MINERALS CH
1.0000 | ORAL_TABLET | Freq: Every day | ORAL | Status: DC
Start: 2022-06-03 — End: 2022-06-06
  Administered 2022-06-03 – 2022-06-06 (×4): 1 via ORAL
  Filled 2022-06-03 (×4): qty 1

## 2022-06-03 MED ORDER — ASPIRIN 81 MG PO TBEC
81.0000 mg | DELAYED_RELEASE_TABLET | Freq: Every day | ORAL | Status: DC
  Administered 2022-06-03 – 2022-06-06 (×4): 81 mg via ORAL
  Filled 2022-06-03 (×4): qty 1

## 2022-06-03 MED ORDER — LACTATED RINGERS IV SOLN
INTRAVENOUS | Status: AC
Start: 2022-06-03 — End: 2022-06-04

## 2022-06-03 MED ORDER — ENOXAPARIN SODIUM 40 MG/0.4ML IJ SOSY
40.0000 mg | PREFILLED_SYRINGE | INTRAMUSCULAR | Status: DC
  Administered 2022-06-03 – 2022-06-06 (×4): 40 mg via SUBCUTANEOUS
  Filled 2022-06-03 (×4): qty 0.4

## 2022-06-03 MED ORDER — ACETAMINOPHEN 500 MG PO TABS
1000.0000 mg | ORAL_TABLET | Freq: Three times a day (TID) | ORAL | Status: DC
  Administered 2022-06-03 – 2022-06-06 (×8): 1000 mg via ORAL
  Filled 2022-06-03 (×10): qty 2

## 2022-06-03 MED ORDER — ACETAMINOPHEN 500 MG PO TABS: 1000.0000 mg | ORAL_TABLET | Freq: Four times a day (QID) | ORAL | Status: DC | PRN

## 2022-06-03 MED ORDER — CHLORHEXIDINE GLUCONATE 0.12% ORAL RINSE (MEDLINE KIT): 15.0000 mL | Freq: Two times a day (BID) | OROMUCOSAL | Status: DC

## 2022-06-03 MED ORDER — FLUCONAZOLE 100 MG PO TABS
200.0000 mg | ORAL_TABLET | Freq: Every day | ORAL | Status: AC
  Administered 2022-06-03 – 2022-06-05 (×3): 200 mg via ORAL
  Filled 2022-06-03 (×2): qty 2
  Filled 2022-06-03 (×2): qty 1

## 2022-06-03 NOTE — ED Notes (Signed)
EEG tech at bedside. 

## 2022-06-03 NOTE — H&P (Cosign Needed Addendum)
Date: 06/03/2022               Patient Name:  Veronica Murray MRN: [id number]  DOB: [birth year] Age / Sex: 66 y.o., female   PCP: [facility]              Medical Service: Internal Medicine Teaching Service              Attending Physician: Dr. Reymundo Poll, MD    First Contact: Kathalene Frames, MS  Pager: [phone]  Second Contact: Dr. Morene Crocker Pager: [phone]  Third Contact Dr. Merrilyn Puma Pager: [phone]       After Hours (After 5p/  First Contact Pager: [phone]  weekends / holidays): Second Contact Pager: [phone]   Chief Complaint: Altered Mental Status  History of Present Illness: ARDELLE Murray is a 66 y.o. female with PMH of  DM, HTN, fibromyalgia, with recent stroke on 02/2022 presenting to the ED for altered mental status via EMS. Pt husband reports pt was released from rehab 3 weeks ago and was not provided prescriptions for any of her medications. Husband states he called her PCP and they also would not provide prescriptions for medications without being seen first. She was suppose to be seen by her PCP today at 5 PM for medication refills. She has not been taking any medications since 05/31/22. Pt's husband is disabled and has been trying to help care of the patient but is unable to do so. He states she has been pretty much bedbound and needing assistance with ADLS. He states normally she is alert and oriented x 4 and able to carry on a conversation.   History provided by husband: Today, he was assisting her out of bed to get ready for a neurologist appointment. He states she was A&Ox4, having a coherent conversation when she suddenly was "out of it" for about 10 mins with bilateral arms shaking. Following the shaking, he reports patient to be flaccid for about 10 mins before coming around to a confused state that last another 10 mins. He was able to get her to the MD office, when suddenly as she was being assisted out of the car she had another  episode of unresponsiveness and shaking of her arms lasting again another 10 minutes. Inside the office she was not able to follow commands, had incomprehensive speech, was sweaty, but no shaking noted at that time. BP taking in the office was 80/53, HR 57, BG 326. EMS was called and she was brought to [facility] ED. He reports one other episode similar to today in which the pt "falls out." He states she always has nausea and diarrhea the night prior to once of these episodes occurring. He states the patient had nausea and diarrhea last night. He states the patient has been complaining of left hip pain and back pain following the first episode. He reports a fall at that time. He states patient never having episodes like this until after her stroke in April.  Per patient, she reports a fall a month ago and has had left hip pain since. She reports being nonambulatory since the fall. She states she has been taking Percocet 5mg , 3 times daily as well as ibuprofen for her pain. She also reports abdominal pain, nausea, and diarrhea for 4 days. She reports a history of multiple seizures. She is unsure of when her last seizure occurred. She endorses dysuria.  History limited by patient's confusion and husband not at bed side.  Meds:  No current facility-administered medications on file prior to encounter.   Current Outpatient Medications on File Prior to Encounter  Medication Sig Dispense Refill   amoxicillin-clavulanate (AUGMENTIN) 875-125 MG tablet Take 1 tablet by mouth 2 (two) times daily. 60 tablet 1   aspirin EC 81 MG EC tablet Take 1 tablet (81 mg total) by mouth daily. Swallow whole. 30 tablet 11   clopidogrel (PLAVIX) 75 MG tablet Take 1 tablet (75 mg total) by mouth daily.     DULoxetine (CYMBALTA) 30 MG capsule Take 30 mg by mouth at bedtime.     escitalopram (LEXAPRO) 5 MG tablet Take 5 mg by mouth daily. (Patient not taking: Reported on 05/14/2022)     feeding supplement (ENSURE ENLIVE / ENSURE PLUS) LIQD  Take 237 mLs by mouth 2 (two) times daily between meals. (Patient not taking: Reported on 05/04/2022) 237 mL 12   gabapentin (NEURONTIN) 300 MG capsule Take 300 mg by mouth 3 (three) times daily.     hydrOXYzine (ATARAX) 10 MG tablet Take 1 tablet (10 mg total) by mouth 3 (three) times daily as needed for anxiety or itching. 30 tablet 0   insulin glargine (LANTUS SOLOSTAR) 100 UNIT/ML Solostar Pen Inject 5 Units into the skin daily. 3 mL 0   Insulin Pen Needle (NOVOFINE) 30G X 8 MM MISC Inject 10 each into the skin as needed. 100 each 0   Multiple Vitamin (MULTIVITAMIN WITH MINERALS) TABS tablet Take 1 tablet by mouth daily. (Patient not taking: Reported on 05/14/2022)     nicotine (NICODERM CQ - DOSED IN MG/24 HOURS) 21 mg/24hr patch Place 1 patch (21 mg total) onto the skin daily. (Patient not taking: Reported on 05/14/2022) 28 patch 0   ondansetron (ZOFRAN) 4 MG tablet Take 1 tablet (4 mg total) by mouth every 6 (six) hours as needed for nausea. (Patient taking differently: Take 4 mg by mouth daily as needed for nausea or vomiting.) 20 tablet 0   oxyCODONE-acetaminophen (PERCOCET/ROXICET) 5-325 MG tablet Take 1 tablet by mouth 4 (four) times daily as needed for severe pain.     pantoprazole sodium (PROTONIX) 40 mg Take 40 mg by mouth daily.     rosuvastatin (CRESTOR) 10 MG tablet Take 1 tablet (10 mg total) by mouth daily. 30 tablet 5    Allergies: Allergies as of 06/03/2022 - Review Complete 06/03/2022  Allergen Reaction Noted   Codeine Hives, Shortness Of Breath, and Nausea Only 06/25/2019   Toradol [ketorolac tromethamine] Hives, Shortness Of Breath, and Nausea Only 06/29/2019   Tramadol hcl Hives, Shortness Of Breath, and Nausea Only 06/29/2019   Past Medical History:  Diagnosis Date   Aortic arch atherosclerosis (HCC)    Diabetes mellitus without complication (HCC)    Encephalopathy    Essential hypertension    Fibromyalgia    Stroke (HCC)    Tobacco abuse     Family History:  Mom  deceased of cancer of unknown origin Father deceased, PMH of alcohol abuse Husband living, disabled  4 Siblings, one sibling deceased from toxic shock syndrome 8 children in good health 16 grandkids  Social History:  Pt lives at home with her husband who is disabled. Pt denies tobacco use and states she quit 3 months ago. She denies alcohol or drug use.   Review of Systems: A complete ROS was negative except as per HPI.   Physical Exam: Blood pressure (!) 126/91, pulse 89, temperature 97.9 F (36.6 C), temperature source Oral, resp. rate 16, SpO2 97 %.  Constitutional:      General: Confused appearing, resting comfortably in bed. Not in acute distress.    Appearance: Normal appearance.  HENT:     Head: Normocephalic and atraumatic. Mucus membranes dry.    Nose: Nose normal.  Eyes:     Conjunctiva/sclera: Conjunctivae normal.  Cardiovascular:     Rate and Rhythm: Normal rate and regular rhythm.  Pulmonary:     Effort: Pulmonary effort is normal. No respiratory distress.     Breath sounds: Normal breath sounds. No wheezing.  Abdomen: Soft non tender. Normal BS Musculoskeletal:        General: No deformity. No tenderness to palpation. No ecchymosis.  Skin:    Findings: No rash.  Neurological:     Mental Status: She is alert. Oriented to person and place, not time or event.    Comments: Patient moves all 4 extremities without difficulty. Difficulty following directions. Psychological: Mood is labile.  EKG: personally reviewed my interpretation is normal sinus rhythm at a rate of 98.  CXR: personally reviewed my interpretation is: Perihilar fluffiness, normal cardiac silhouette, trachea midline.   Assessment & Plan by Problem: Principal Problem:   Altered mental status  AMEENAH PROSSER is a 66 y.o. female with PMH of  DM, HTN, fibromyalgia, with recent stroke on 02/2022 admitted for altered mental status following seizure like activity.  Altered Mental Status w/ seizure  like activity Pt presenting with altered mental status following seizure like activity per husband. History limited by patient's confusion and husband poor historian. Concerned for encephalopathy due to UTI and dehydration due to diarrhea, vs seizures and post ictal state. UDS negative. CT head showing stable watershed infarcts in the bilateral frontal lobes and left parietal lobe.  - Appreciate Neurology recommendations - Hydrate with IVF - Start ceftriaxone IV 1gm daily for 5 days for UTI - Frequent neuro checks  - Seizure precautions - obtaining EEG - MRI Brain w/o pending - Will not start any AED's at this time. If patient noted to have seizure like activity please call neurology.  - Neurology will continue to follow patient   UTI Pt endorsing dysuria. UA infectious appearing. Concerning for encephalopathy given altered mental status and dehydration. - Urine cultures - Ceftriaxone IV 1mg  day 1 for 5 days  Left Hip Pain Pt reporting left hip pain for 1 month and reports a fall. No tenderness to palpation.  - XR left hip - Tylenol PRN for pain  Stroke Pt with recent history of stroke in 02/2022.  - Restart ASA - Restart Crestor  Diabetes Mellitus Type 2 Pt with diagnosis of DM T2. CBG 302. Will monitor blood glucose. - CBG - SSI - Semglee 5 units  HTN Pt blood pressure currently 126/91. Will continue to monitor. - Monitoring  [facility]    DIET: DYS 3 DVT PPX: Lovenox BOWEL: none CODE: FULL FAM COM: n/a   Signed: 03/2022, Medical Student 06/03/2022, 5:13 PM  Pager: [phone]  Attestation for Student Documentation:  I personally was present and performed or re-performed the history, physical exam and medical decision-making activities of this service and have verified that the service and findings are accurately documented in the student's note.  [phone], MD 06/03/2022, 6:49 PM

## 2022-06-03 NOTE — Telephone Encounter (Signed)
This patient arrived today for her scheduled appointment but when the nurse arrived to check her in she was extremely diaphoretic and was not feeling well.  Her blood pressure was 80 systolic and EMS were called CBG was 325 mg percent and had not taken her insulin for the last several days.  Patient was taken by EMS for evaluation to the emergency room.  Patient's husband and son were at the bedside and voiced understanding.  Patient will be rescheduled to see me once she is feeling better.

## 2022-06-03 NOTE — Consult Note (Addendum)
Neurology Consultation  Reason for Consult: AMS and ?seizure activity Referring Physician: Dr. Durwin Nora  CC: AMS  History is obtained from:husband and medical record   HPI: Veronica Murray is a 66 y.o. female DM, HTN, smoker, recent stroke 02/2022 and discharge from rehab on 05/31/2022, and per husband. was not provided prescriptions for any of her medications. Husband states he called her PCP and they also would not provide prescriptions for medications without being seen first, and subsequently has not had any of her prescribed medications since Friday. Husband also states that since discharged from rehab on Friday she has been vomiting and not keeping a lot down. He states he gave her some pepto yesterday and seemed a little better and was able to hold down a boost and some gatorade. Also, he states she has been pretty much bedbound and needing assistance with ADLS since discharge on Friday. He states normally she is alert and oriented x 4 and carry on normal conversations.  Today he was assisting her out of bed to get ready for a neurologist appointment, she was alert and oriented, having a coherent conversation, he placed her in the seat of her rolling walker when she suddenly was "out of it" for about 10 seconds with bilateral arms shaking. He states once she came to she was noted to be "her normal self". He states she was fine in the ride over to the MD office, when suddenly as she was being assisted out of the care she had another episode of unresponsiveness and shaking of her arms lasting again another 10-30 seconds. Once she stopped she was able to care on a normal conversation. Once she inside the office she suddenly was not able to follow commands, had incomprehensive speech, was sweaty, but no shaking noted at that time. BP taking in the office was 80/53, HR 57, BG 326. EMS was called and she was brought to [facility] ED.   ROS:  Unable to obtain due to altered mental status.   Past Medical History:   Diagnosis Date   Aortic arch atherosclerosis (HCC)    Diabetes mellitus without complication (HCC)    Encephalopathy    Essential hypertension    Fibromyalgia    Stroke (HCC)    Tobacco abuse      Family History  Problem Relation Age of Onset   CAD Neg Hx      Social History:   reports that she has quit smoking. Her smoking use included cigarettes. She has a 75.00 pack-year smoking history. She has never used smokeless tobacco. She reports that she does not currently use alcohol. She reports that she does not currently use drugs after having used the following drugs: Marijuana. Frequency: 7.00 times per week.  Medications No current facility-administered medications for this encounter.  Current Outpatient Medications:    amoxicillin-clavulanate (AUGMENTIN) 875-125 MG tablet, Take 1 tablet by mouth 2 (two) times daily., Disp: 60 tablet, Rfl: 1   aspirin EC 81 MG EC tablet, Take 1 tablet (81 mg total) by mouth daily. Swallow whole., Disp: 30 tablet, Rfl: 11   clopidogrel (PLAVIX) 75 MG tablet, Take 1 tablet (75 mg total) by mouth daily., Disp: , Rfl:    DULoxetine (CYMBALTA) 30 MG capsule, Take 30 mg by mouth at bedtime., Disp: , Rfl:    escitalopram (LEXAPRO) 5 MG tablet, Take 5 mg by mouth daily. (Patient not taking: Reported on 05/14/2022), Disp: , Rfl:    feeding supplement (ENSURE ENLIVE / ENSURE PLUS) LIQD, Take  237 mLs by mouth 2 (two) times daily between meals. (Patient not taking: Reported on 05/04/2022), Disp: 237 mL, Rfl: 12   gabapentin (NEURONTIN) 300 MG capsule, Take 300 mg by mouth 3 (three) times daily., Disp: , Rfl:    hydrOXYzine (ATARAX) 10 MG tablet, Take 1 tablet (10 mg total) by mouth 3 (three) times daily as needed for anxiety or itching., Disp: 30 tablet, Rfl: 0   insulin glargine (LANTUS SOLOSTAR) 100 UNIT/ML Solostar Pen, Inject 5 Units into the skin daily., Disp: 3 mL, Rfl: 0   Insulin Pen Needle (NOVOFINE) 30G X 8 MM MISC, Inject 10 each into the skin as  needed., Disp: 100 each, Rfl: 0   Multiple Vitamin (MULTIVITAMIN WITH MINERALS) TABS tablet, Take 1 tablet by mouth daily. (Patient not taking: Reported on 05/14/2022), Disp: , Rfl:    nicotine (NICODERM CQ - DOSED IN MG/24 HOURS) 21 mg/24hr patch, Place 1 patch (21 mg total) onto the skin daily. (Patient not taking: Reported on 05/14/2022), Disp: 28 patch, Rfl: 0   ondansetron (ZOFRAN) 4 MG tablet, Take 1 tablet (4 mg total) by mouth every 6 (six) hours as needed for nausea. (Patient taking differently: Take 4 mg by mouth daily as needed for nausea or vomiting.), Disp: 20 tablet, Rfl: 0   oxyCODONE-acetaminophen (PERCOCET/ROXICET) 5-325 MG tablet, Take 1 tablet by mouth 4 (four) times daily as needed for severe pain., Disp: , Rfl:    pantoprazole sodium (PROTONIX) 40 mg, Take 40 mg by mouth daily., Disp: , Rfl:    rosuvastatin (CRESTOR) 10 MG tablet, Take 1 tablet (10 mg total) by mouth daily., Disp: 30 tablet, Rfl: 5   Exam: Current vital signs: BP 107/64   Pulse 98   Temp 98.1 F (36.7 C) (Oral)   Resp 16   SpO2 99%  Vital signs in last 24 hours: Temp:  [98.1 F (36.7 C)] 98.1 F (36.7 C) (07/24 0933) Pulse Rate:  [91-98] 98 (07/24 1158) Resp:  [15-16] 16 (07/24 1158) BP: (107-155)/(64-100) 107/64 (07/24 1158) SpO2:  [99 %-100 %] 99 % (07/24 1158)  GENERAL: Awake, alert laying on stretcher in NAD HEENT: - Normocephalic and atraumatic, dry mm, missing teeth LUNGS - Clear to auscultation bilaterally with no wheezes CV - S1S2 RRR, no m/r/g, equal pulses bilaterally. ABDOMEN - Soft, nontender, nondistended with normoactive BS Ext: warm, well perfused, intact peripheral pulses, no edema  NEURO:  Mental Status: AA&Ox1. She is not able to state her age, current month or year or situation. Can follow simple commands, can name simple objects like pen, and glasses and what they are used for. She can repeat Language: speech is clear.   Cranial Nerves: PERRL, EOMI, visual fields with  inconsistent blink to threat bilaterally, unable to count fingers,  no facial asymmetry, facial sensation intact, hearing intact, tongue/uvula/soft palate midline, normal sternocleidomastoid and trapezius muscle strength. No evidence of tongue atrophy or fibrillations Motor: moving all 4 extremities equally and symmetric Tone: is normal and bulk is normal Sensation- Intact to light touch bilaterally Coordination: unable to assess due to AMS Gait- deferred    Labs I have reviewed labs in epic and the results pertinent to this consultation are:  CBC    Component Value Date/Time   WBC 7.4 06/03/2022 1010   RBC 4.35 06/03/2022 1010   HGB 13.9 06/03/2022 1017   HCT 41.0 06/03/2022 1017   PLT 311 06/03/2022 1010   MCV 86.4 06/03/2022 1010   MCH 30.6 06/03/2022 1010   MCHC  35.4 06/03/2022 1010   RDW 12.8 06/03/2022 1010   LYMPHSABS 1.6 06/03/2022 1010   MONOABS 0.4 06/03/2022 1010   EOSABS 0.0 06/03/2022 1010   BASOSABS 0.0 06/03/2022 1010    CMP     Component Value Date/Time   NA 130 (L) 06/03/2022 1017   K 4.0 06/03/2022 1017   CL 92 (L) 06/03/2022 1017   CO2 24 06/03/2022 1010   GLUCOSE 302 (H) 06/03/2022 1017   BUN 32 (H) 06/03/2022 1017   CREATININE 0.60 06/03/2022 1017   CALCIUM 9.9 06/03/2022 1010   PROT 7.5 06/03/2022 1010   ALBUMIN 3.9 06/03/2022 1010   AST 28 06/03/2022 1010   ALT 27 06/03/2022 1010   ALKPHOS 158 (H) 06/03/2022 1010   BILITOT 0.7 06/03/2022 1010   GFRNONAA >60 06/03/2022 1010   GFRAA >60 06/26/2019 0434    Lipid Panel     Component Value Date/Time   CHOL 269 (H) 06/26/2019 0434   TRIG 101 03/11/2022 0458   HDL 31 (L) 06/26/2019 0434   CHOLHDL 8.7 06/26/2019 0434   VLDL 63 (H) 06/26/2019 0434   LDLCALC 175 (H) 06/26/2019 0434     Imaging I have reviewed the images obtained:  CT-head 1. Stable CT appearance of watershed infarcts in the bilateral frontal lobes and left parietal lobe. 2. Atherosclerosis. 3. Periventricular white  matter and corona radiata hypodensities favor chronic ischemic microvascular white matter disease.  CXR: There is faint haziness in right parahilar region which may be an artifact due to confluence of chest wall attenuation over the lung field suggests early pneumonia  LABS: UA positive Urine Cx pending WBC 7.4 NA 131 (Appears to be around her baseline) BUN 32 Cr 0.60 Glucose 302 UDS negative  Afebrile   Assessment:  Veronica Murray is a 66 y.o. female DM, HTN, smoker, recent stroke 02/2022 and discharge from rehab on 05/31/2022, and per husband. was not provided prescriptions for any of her medications. Since discharged from rehab on Friday she has been vomiting and not keeping a lot down.  She had several episodes of decreased responsiveness today, at least the last of which was associated with pretty significant hypotension.  I think possibilities include transient hypoperfusion due to hypotension versus partial seizures in the setting of recent stroke.  Recurrent confusion could certainly have more to do with her UTI than the events of today, but she also could have extended her stroke in the setting of hypotension.   Impression: Encephalopathy due to UTI and dehydration due to vomiting, vs seizures and post ictal state  Recommendations: - Hydrate with IVF -Treatment of UTI per internal medicine - Frequent neuro checks  -Seizure precautions -Will obtain routine EEG - Will obtain MRI Brain w/o - Will not start any AED's at this time. If patient noted to have seizure like activity please call neurology.  - Neurology will continue to follow patient   Gevena Mart DNP, ACNPC-AG   I have seen the patient reviewed the above note.  She will need further evaluation for syncope versus seizure versus stroke extension.  Certainly it is unfortunate that she fell through the cracks with regards to getting her medications, but I am not sure how much of the role that played today.   Neurology will continue to follow.  Ritta Slot, MD [facility] [phone]  If 7pm- 7am, please page neurology on call as listed in AMION.

## 2022-06-03 NOTE — Telephone Encounter (Signed)
The patient was scheduled for a new patient appointment with Dr. Pearlean Brownie today. When I went to the lobby, she was diaphoretic, confused, unable to follow commands and having slurred speech.   EMS was called to the office.   Her husband reported she was talking clearly on the way to our office. When she got out of the truck in our parking lot, he said she had body shaking for one minute prior to blacking out for a few seconds. He put her in a wheelchair and then noticed the sudden change in speech and confusion.  BP in left arm, sitting, was 80/53. Pulse 57. Blood glucose 326.  She has history of multiple strokes. She was just discharged from a rehab center on 05/31/22. Per her husband, she has not had Lantus since her discharge.   She was transported to the ED.

## 2022-06-03 NOTE — ED Triage Notes (Signed)
Pt BIB [facility] from [facility]. Called out for patient not responding to verbal, just looking around. Released from rehab on Friday, pt was at rehab for a stroke. No meds since Friday, released without meds. CBG 326. No insulin since Friday. Diarrhea this AM. Husband reports pt has not been feeling herself since Friday. Husband reports similar sx with previous strokes. Sx have improved slightly en route. BP 120/80. HR 104. Pt endorses slight abdominal pain.

## 2022-06-03 NOTE — Procedures (Signed)
Patient Name: KIMBERLEIGH MEHAN  MRN: [id number]  Epilepsy Attending: Charlsie Quest  Referring Physician/Provider: Mathews Argyle, NP  Date: 06/03/2022 Duration: 21.05 mins  Patient history: 66yo F with ams. EEG to evaluate for seizure  Level of alertness: Awake  AEDs during EEG study: None  Technical aspects: This EEG study was done with scalp electrodes positioned according to the 10-20 International system of electrode placement. Electrical activity was acquired at a sampling rate of 500Hz  and reviewed with a high frequency filter of 70Hz  and a low frequency filter of 1Hz . EEG data were recorded continuously and digitally stored.   Description: The posterior dominant rhythm consists of 8 Hz activity of moderate voltage (25-35 uV) seen predominantly in posterior head regions, symmetric and reactive to eye opening and eye closing. EEG showed intermittent generalized 3 to 6 Hz theta-delta slowing. Hyperventilation and photic stimulation were not performed.     ABNORMALITY - Intermittent slow, generalized  IMPRESSION: This study is suggestive of mild diffuse encephalopathy, nonspecific etiology. No seizures or epileptiform discharges were seen throughout the recording.  Liza Czerwinski 

## 2022-06-03 NOTE — ED Provider Notes (Signed)
[facility] EMERGENCY DEPARTMENT Provider Note   CSN: [id number] Arrival date & time: 06/03/22  9826     History  Chief Complaint  Patient presents with   Altered Mental Status    Veronica Murray is a 66 y.o. female.  66 year old female with history of CVA recently discharged from SNF previous Friday presents today for evaluation of altered mental status.  Patient unable to provide history secondary to mental status change.  History provided by husband, and nursing note from clinic visit this morning.  Acute change in mental status around 8:30 PM.  Per husband patient has not been feeling well the past few days with nausea and diarrhea.  Husband reports the symptoms improved following taking Pepto-Bismol.  Patient this morning prior to going to neurology appointment had an episode of shaking that lasted about 10 seconds.  She had another episode as she was going into the office.  Following this episode husband reports patient became diaphoretic and confused.  Patient currently is alert and oriented to self.  Per husband patient is alert oriented x4 at baseline.  Husband states patient has not had any medication since being discharged from SNF.  He states primary care office who she has an appointment with later today could not refill her medications until she was seen in clinic.  The history is provided by medical records and the spouse. No language interpreter was used.       Home Medications Prior to Admission medications   Medication Sig Start Date End Date Taking? Authorizing Provider  amoxicillin-clavulanate (AUGMENTIN) 875-125 MG tablet Take 1 tablet by mouth 2 (two) times daily. 04/02/22   Tsosie Billing, MD  aspirin EC 81 MG EC tablet Take 1 tablet (81 mg total) by mouth daily. Swallow whole. 01/27/22   Esco, Mariann Barter, MD  clopidogrel (PLAVIX) 75 MG tablet Take 1 tablet (75 mg total) by mouth daily. 03/16/22   Lorella Nimrod, MD  DULoxetine (CYMBALTA) 30  MG capsule Take 30 mg by mouth at bedtime. 05/07/22   [provider]  escitalopram (LEXAPRO) 5 MG tablet Take 5 mg by mouth daily. Patient not taking: Reported on 05/14/2022 03/18/22   [provider]  feeding supplement (ENSURE ENLIVE / ENSURE PLUS) LIQD Take 237 mLs by mouth 2 (two) times daily between meals. Patient not taking: Reported on 05/04/2022 03/16/22   Lorella Nimrod, MD  gabapentin (NEURONTIN) 300 MG capsule Take 300 mg by mouth 3 (three) times daily. 02/01/22   [provider]  hydrOXYzine (ATARAX) 10 MG tablet Take 1 tablet (10 mg total) by mouth 3 (three) times daily as needed for anxiety or itching. 03/15/22   Lorella Nimrod, MD  insulin glargine (LANTUS SOLOSTAR) 100 UNIT/ML Solostar Pen Inject 5 Units into the skin daily. 05/08/22   Gaylan Gerold, DO  Insulin Pen Needle (NOVOFINE) 30G X 8 MM MISC Inject 10 each into the skin as needed. 06/26/19   Hillary Bow, MD  Multiple Vitamin (MULTIVITAMIN WITH MINERALS) TABS tablet Take 1 tablet by mouth daily. Patient not taking: Reported on 05/14/2022 03/16/22   Lorella Nimrod, MD  nicotine (NICODERM CQ - DOSED IN MG/24 HOURS) 21 mg/24hr patch Place 1 patch (21 mg total) onto the skin daily. Patient not taking: Reported on 05/14/2022 03/16/22   Lorella Nimrod, MD  ondansetron (ZOFRAN) 4 MG tablet Take 1 tablet (4 mg total) by mouth every 6 (six) hours as needed for nausea. Patient taking differently: Take 4 mg by mouth daily as needed  for nausea or vomiting. 05/08/22   Gaylan Gerold, DO  oxyCODONE-acetaminophen (PERCOCET/ROXICET) 5-325 MG tablet Take 1 tablet by mouth 4 (four) times daily as needed for severe pain. 04/16/22   [provider]  pantoprazole sodium (PROTONIX) 40 mg Take 40 mg by mouth daily. 03/15/22   Lorella Nimrod, MD  rosuvastatin (CRESTOR) 10 MG tablet Take 1 tablet (10 mg total) by mouth daily. 01/18/22   Algernon Huxley, MD      Allergies    Codeine, Toradol [ketorolac tromethamine], and Tramadol hcl    Review  of Systems   Review of Systems  Unable to perform ROS: Mental status change    Physical Exam Updated Vital Signs BP (!) 155/100   Pulse 91   Temp 98.1 F (36.7 C) (Oral)   Resp 15   SpO2 100%  Physical Exam Vitals and nursing note reviewed.  Constitutional:      General: She is not in acute distress.    Appearance: Normal appearance. She is not ill-appearing.  HENT:     Head: Normocephalic and atraumatic.     Nose: Nose normal.  Eyes:     Conjunctiva/sclera: Conjunctivae normal.  Cardiovascular:     Rate and Rhythm: Normal rate and regular rhythm.  Pulmonary:     Effort: Pulmonary effort is normal. No respiratory distress.     Breath sounds: Normal breath sounds. No wheezing.  Musculoskeletal:        General: No deformity.  Skin:    Findings: No rash.  Neurological:     Mental Status: She is alert.     Comments: Patient moves all 4 extremities without difficulty.  Good strength in bilateral upper extremities.  Without facial asymmetry.  Pupils are equal round and reactive to light.  No focal neurological deficits were noted.     ED Results / Procedures / Treatments   Labs (all labs ordered are listed, but only abnormal results are displayed) Labs Reviewed  CBC WITH DIFFERENTIAL/PLATELET  COMPREHENSIVE METABOLIC PANEL  MAGNESIUM  URINALYSIS, ROUTINE W REFLEX MICROSCOPIC  I-STAT CHEM 8, ED  TROPONIN I (HIGH SENSITIVITY)    EKG None  Radiology No results found.  Procedures Procedures    Medications Ordered in ED Medications - No data to display  ED Course/ Medical Decision Making/ A&P                           Medical Decision Making Amount and/or Complexity of Data Reviewed Labs: ordered. Radiology: ordered.   Medical Decision Making / ED Course   This patient presents to the ED for concern of altered mental status, this involves an extensive number of treatment options, and is a complaint that carries with it a high risk of complications and  morbidity.  The differential diagnosis includes CVA, intracranial bleed, pneumonia, UTI  MDM: 66 year old female with history of CVA, left CEA presents today for acute change of mental status earlier today.  Exam without focal neurological deficits.  CT head without acute concerns.  CBC without leukocytosis or anemia.  CMP shows mild hyponatremia at 131 however this is in setting of glucose of 308.  Likely pseudohyponatremia.  Alk phos elevated at 158 but LFTs otherwise normal.  Chest x-ray with questionable pneumonia.  No clinical signs of pneumonia.  Patient afebrile and without leukocytosis.  In and out catheterization ordered for UA collection.  This is pending.  Case discussed with neurology and given the generalized shaking episodes prior  to symptom onset concern for seizures.  Neurology plans to evaluate patient with MRI brain, and EEG.  They recommend admission for observation.  Case discussed with internal medicine team who will evaluate patient for admission.   Additional history obtained: -Additional history obtained from husband -External records from outside source obtained and reviewed including: Chart review including previous notes, labs, imaging, consultation notes   Lab Tests: -I ordered, reviewed, and interpreted labs.   The pertinent results include:   Labs Reviewed  COMPREHENSIVE METABOLIC PANEL - Abnormal; Notable for the following components:      Result Value   Sodium 131 (*)    Chloride 92 (*)    Glucose, Bld 308 (*)    BUN 32 (*)    Alkaline Phosphatase 158 (*)    All other components within normal limits  I-STAT CHEM 8, ED - Abnormal; Notable for the following components:   Sodium 130 (*)    Chloride 92 (*)    BUN 32 (*)    Glucose, Bld 302 (*)    Calcium, Ion 1.14 (*)    All other components within normal limits  CBC WITH DIFFERENTIAL/PLATELET  MAGNESIUM  URINALYSIS, ROUTINE W REFLEX MICROSCOPIC  RAPID URINE DRUG SCREEN, HOSP PERFORMED  TROPONIN I (HIGH  SENSITIVITY)  TROPONIN I (HIGH SENSITIVITY)      EKG  EKG Interpretation  Date/Time:    Ventricular Rate:    PR Interval:    QRS Duration:   QT Interval:    QTC Calculation:   R Axis:     Text Interpretation:           Imaging Studies ordered: I ordered imaging studies including CT head without contrast, chest x-ray I independently visualized and interpreted imaging. I agree with the radiologist interpretation   Medicines ordered and prescription drug management: No orders of the defined types were placed in this encounter.   -I have reviewed the patients home medicines and have made adjustments as needed  Reevaluation: After the interventions noted above, I reevaluated the patient and found that they have :improved Slightly improved in terms of speech on reevaluation.  Still remains oriented to self.  Co morbidities that complicate the patient evaluation  Past Medical History:  Diagnosis Date   Aortic arch atherosclerosis ([place])    Diabetes mellitus without complication ([place])    Encephalopathy    Essential hypertension    Fibromyalgia    Stroke ([place])    Tobacco abuse       Dispostion: Discussed with internal medicine team who will evaluate patient for admission.  Final Clinical Impression(s) / ED Diagnoses Final diagnoses:  Altered mental status, unspecified altered mental status type    Rx / DC Orders ED Discharge Orders     None         Evlyn Courier, PA-C 06/03/22 1402    Godfrey Pick, MD 06/04/22 1031

## 2022-06-03 NOTE — Progress Notes (Signed)
EEG complete - results pending 

## 2022-06-03 NOTE — Progress Notes (Signed)
Patient is in hallway bed still and has been since 5. Unable to place EEG until patient moves to room.

## 2022-06-04 ENCOUNTER — Observation Stay (HOSPITAL_COMMUNITY): Payer: Medicare HMO

## 2022-06-04 DIAGNOSIS — N390 Urinary tract infection, site not specified: Secondary | ICD-10-CM

## 2022-06-04 DIAGNOSIS — R55 Syncope and collapse: Secondary | ICD-10-CM

## 2022-06-04 DIAGNOSIS — I959 Hypotension, unspecified: Secondary | ICD-10-CM | POA: Diagnosis not present

## 2022-06-04 DIAGNOSIS — R4182 Altered mental status, unspecified: Secondary | ICD-10-CM | POA: Diagnosis not present

## 2022-06-04 LAB — BASIC METABOLIC PANEL
Anion gap: 9 (ref 5–15)
BUN: 28 mg/dL — ABNORMAL HIGH (ref 8–23)
CO2: 28 mmol/L (ref 22–32)
Calcium: 9.3 mg/dL (ref 8.9–10.3)
Chloride: 94 mmol/L — ABNORMAL LOW (ref 98–111)
Creatinine, Ser: 0.52 mg/dL (ref 0.44–1.00)
GFR, Estimated: >60 mL/min (ref >60)
Glucose, Bld: 177 mg/dL — ABNORMAL HIGH (ref 70–99)
Potassium: 3.7 mmol/L (ref 3.5–5.1)
Sodium: 131 mmol/L — ABNORMAL LOW (ref 135–145)

## 2022-06-04 LAB — CBC WITH DIFFERENTIAL/PLATELET
Abs Immature Granulocytes: 0.02 10*3/uL (ref 0.00–0.07)
Basophils Absolute: 0 10*3/uL (ref 0.0–0.1)
Basophils Relative: 0 %
Eosinophils Absolute: 0.1 10*3/uL (ref 0.0–0.5)
Eosinophils Relative: 1 %
HCT: 31.7 % — ABNORMAL LOW (ref 36.0–46.0)
Hemoglobin: 11.3 g/dL — ABNORMAL LOW (ref 12.0–15.0)
Immature Granulocytes: 0 %
Lymphocytes Relative: 50 %
Lymphs Abs: 3.7 10*3/uL (ref 0.7–4.0)
MCH: 31.2 pg (ref 26.0–34.0)
MCHC: 35.6 g/dL (ref 30.0–36.0)
MCV: 87.6 fL (ref 80.0–100.0)
Monocytes Absolute: 0.6 10*3/uL (ref 0.1–1.0)
Monocytes Relative: 8 %
Neutro Abs: 3 10*3/uL (ref 1.7–7.7)
Neutrophils Relative %: 41 %
Platelets: 286 10*3/uL (ref 150–400)
RBC: 3.62 MIL/uL — ABNORMAL LOW (ref 3.87–5.11)
RDW: 12.9 % (ref 11.5–15.5)
WBC: 7.4 10*3/uL (ref 4.0–10.5)
nRBC: 0 % (ref 0.0–0.2)

## 2022-06-04 LAB — GLUCOSE, CAPILLARY
Glucose-Capillary: 207 mg/dL — ABNORMAL HIGH (ref 70–99)
Glucose-Capillary: 260 mg/dL — ABNORMAL HIGH (ref 70–99)
Glucose-Capillary: 169 mg/dL — ABNORMAL HIGH (ref 70–99)

## 2022-06-04 LAB — URINE CULTURE: Culture: NO GROWTH

## 2022-06-04 LAB — TSH: TSH: 2.564 u[IU]/mL (ref 0.350–4.500)

## 2022-06-04 MED ORDER — INSULIN GLARGINE-YFGN 100 UNIT/ML ~~LOC~~ SOLN
5.0000 [IU] | Freq: Every day | SUBCUTANEOUS | Status: DC
  Administered 2022-06-04: 5 [IU] via SUBCUTANEOUS
  Filled 2022-06-04 (×4): qty 0.05

## 2022-06-04 MED ORDER — INSULIN ASPART 100 UNIT/ML IJ SOLN
0.0000 [IU] | Freq: Three times a day (TID) | INTRAMUSCULAR | Status: DC
  Administered 2022-06-04: 5 [IU] via SUBCUTANEOUS
  Administered 2022-06-05: 3 [IU] via SUBCUTANEOUS
  Administered 2022-06-05: 2 [IU] via SUBCUTANEOUS
  Administered 2022-06-05: 3 [IU] via SUBCUTANEOUS
  Administered 2022-06-06: 1 [IU] via SUBCUTANEOUS
  Administered 2022-06-06: 2 [IU] via SUBCUTANEOUS

## 2022-06-04 MED ORDER — LACTATED RINGERS IV BOLUS: 1000.0000 mL | Freq: Once | INTRAVENOUS | Status: DC

## 2022-06-04 NOTE — Care Management (Signed)
  Transition of Care [facility]) Screening Note   Patient Details  Name: MILIANI DEIKE Date of Birth: May 16, 1956   Transition of Care [facility]) CM/SW Contact:    Lawerance Sabal, RN Phone Number: 06/04/2022, 4:49 PM    Transition of Care Department [facility]) has reviewed patient and no TOC needs have been identified at this time. We will continue to monitor patient advancement through interdisciplinary progression rounds. If new patient transition needs arise, please place a TOC consult.

## 2022-06-04 NOTE — Progress Notes (Signed)
Subjective: She reports that she is feeling better, no further episodes of LOC.  She reports that her blood pressures normally run in the 150s  Exam: Vitals:   06/04/22 0804 06/04/22 0806  BP: 96/61   Pulse: 80   Resp: 16   Temp:  98.1 F (36.7 C)  SpO2: 100%    Gen: In bed, NAD Resp: non-labored breathing, no acute distress Abd: soft, nt  Neuro: MS: Awake, alert, gives month as May, year as 22, IR:SWNI, vff Motor: no drift  Sensory:intact to LT  Pertinent Labs: TSH nml Mg 1.9 Cr 0.5  Impression: 66 year old female with transient episode of decreased responsiveness in the setting of hypotension.  Possibilities for persistent confusion include recrudescence in the setting of other physiological stressor (UTI), expansion of previous infarct due to hypotension, seizure secondary to recent stroke.  With a negative EEG, I would not favor antiepileptic therapy at this time given an alternative explanation of hypotension.  If her MRI is stable, then I would simply address her underlying hypotension and UTI  Recommendations: 1) MRI brain 2) if MRI brain is stable, no further recommendations at this time.  Ritta Slot, MD [facility] [phone]  If 7pm- 7am, please page neurology on call as listed in AMION.

## 2022-06-04 NOTE — Progress Notes (Signed)
PT Cancellation Note  Patient Details Name: Veronica Murray MRN: [id number] DOB: September 05, 1956   Cancelled Treatment:    Reason Eval/Treat Not Completed: Medical issues which prohibited therapy.  Hold pending her imaging of L hip and knee post fall and with significant pain per nsg.  Will evaluate after her imaging results are posted.   Ivar Drape 06/04/2022, 1:09 PM  Samul Dada, PT PhD Acute Rehab Dept. Number: [facility] [id number] and [facility] [phone]

## 2022-06-04 NOTE — ED Notes (Signed)
Tele  AMS, Needs EEG

## 2022-06-04 NOTE — Progress Notes (Signed)
PT Cancellation Note  Patient Details Name: Veronica Murray MRN: [id number] DOB: 04/25/1956   Cancelled Treatment:    Reason Eval/Treat Not Completed: Other (comment).  Pt is being transported to another location, retry as time and pt allow.   Ivar Drape 06/04/2022, 9:39 AM  Samul Dada, PT PhD Acute Rehab Dept. Number: [facility] [id number] and [facility] [phone]

## 2022-06-04 NOTE — Care Management Obs Status (Signed)
MEDICARE OBSERVATION STATUS NOTIFICATION   Patient Details  Name: Veronica Murray MRN: [id number] Date of Birth: 03/10/1956   Medicare Observation Status Notification Given:  Yes    Lawerance Sabal, RN 06/04/2022, 4:23 PM

## 2022-06-04 NOTE — ED Notes (Signed)
Breakfast order placed ?

## 2022-06-04 NOTE — Progress Notes (Addendum)
Subjective:  She reports feels better today. Having some low back pain. No belly pain or nausea. No further shaking episodes. She has had a BM that was formed. She denies dysuria today.  Objective:  Vital signs in last 24 hours: Vitals:   06/04/22 0300 06/04/22 0400 06/04/22 0457 06/04/22 0600  BP: (!) 122/108 (!) 136/57  125/80  Pulse: 76 81  78  Resp: 16 16  16   Temp:   97.9 F (36.6 C)   TempSrc:   Oral   SpO2: 97% 96%  97%   Weight change:  No intake or output data in the 24 hours ending 06/04/22 0726  Constitutional:      General: Confused, disheveled appearing, resting comfortably in bed. Not in acute distress.    Appearance: Normal appearance.  HENT:     Head: Normocephalic and atraumatic.    Nose: Nose normal.  Eyes:     Conjunctiva/sclera: Conjunctivae normal.  Cardiovascular:     Rate and Rhythm: Normal rate and regular rhythm.  Pulmonary:     Effort: Pulmonary effort is normal. No respiratory distress.     Breath sounds: Normal breath sounds. No wheezing.  Abdomen: Soft abdomen. Normal BS Musculoskeletal:        General: No deformity. No tenderness to palpation. No ecchymosis.  Skin:    Findings: No rash.  Neurological:     Mental Status: She is alert. Oriented to person and place, not time or event.    Comments: Patient moves all 4 extremities without difficulty. Difficulty following directions. Psychological: Mood is labile.  Assessment/Plan:  Principal Problem:   Altered mental status  Veronica Murray is a 66 y.o. female with PMH of  DM, HTN, fibromyalgia, with recent stroke on 02/2022 admitted for altered mental status following seizure like activity.   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 History limited by patient's confusion and husband poor historian. Concerned for encephalopathy due to UTI and  dehydration due to diarrhea, vs seizures and post ictal state with negative EEG overnight. UDS negative. CT head showing stable watershed infarcts in the bilateral frontal lobes and left parietal lobe. Pt's BP dropped to 90/60s today and in the setting of symptoms with positional changes and prodrome of nausea concern for convulsive syncope. Also concern for transient hypoperfusion and expansion of previous infarct, awaiting MRI brain. Pt is not orthostatic at this time. Pt previously on gabapentin 300mg  unsure of medication compliance less likely gabapentin induced myoclonus. - Appreciate Neurology recommendations - MRI Brain w/o pending - Hydrate with IVF - Start ceftriaxone IV 1gm daily for 5 days for UTI - Frequent neuro checks  - Seizure precautions - Will not start any AED's at this time. If patient noted to have seizure like activity please call neurology.  - Neurology will continue to follow patient  - PT/OT   UTI Pt endorsing dysuria. UA infectious appearing with budding yeast present. Concerning for encephalopathy given altered mental status and dehydration. - Urine cultures - Ceftriaxone IV 1mg  day 2 of 5 days - Diflucan 200mg  for 3 days   Left Hip Pain Pt reporting left hip pain for 1 month and reports a fall. No tenderness to palpation. Pt seen on 07/03 for left hip pain with CT hip that showed a subacute fracture of the sacrum at the S3 level and moderate osteoarthritis. Given prior fracture and continued pain will  repeat imaging to reevaluate given concern for falls with multiple "fall out" episodes. - CT left hip - Tylenol and Oxycodone 5mg  PRN for pain q6h   Stroke Pt with recent history of stroke in 02/2022 watershed infarcts in the bilateral frontal lobes and left parietal lobe. Stable on CT. Awaiting MRI brain given concern for hypoperfusion and expansion of infarct given BP of 90/60s this morning. - Continue ASA - Continue Crestor   Diabetes Mellitus Type 2 Pt with  diagnosis of DM T2. CBG 177. Will monitor blood glucose. - CBG monitoring - SSI - Semglee 5 units   HTN Pt blood pressure currently 90/60s. Concerning given her episodes of loss of consciousness during position changes. Will continue to monitor. - Negative orthostatics   Deconditioning  Pt has had increasing difficulty with IADLS following stroke per husband. Husband is disabled and does not feel he is able to take care of patient. Pt is not able to drive. Husband agreeable to SNF if deemed appropriate. Husband feels she needs more rehabilitation. -PT/OT eval    [facility]ACT[facility]    DIET: DYS 3 DVT PPX: Lovenox BOWEL: none CODE: FULL FAM COM: Spoke to husband on the phone yesterday   LOS: 0 days   03/2022, Medical Student 06/04/2022, 7:26 AM

## 2022-06-04 NOTE — Progress Notes (Signed)
OT Cancellation Note  Patient Details Name: Veronica Murray MRN: [id number] DOB: 30-Dec-1955   Cancelled Treatment:    Reason Eval/Treat Not Completed: Medical issues which prohibited therapy.  Awaiting x-rays of knee and hip.  Will check back as time permits or see tomorrow for completion of evaluation.  Myleen Brailsford OTR/L 06/04/2022, 1:55 PM

## 2022-06-05 DIAGNOSIS — Z794 Long term (current) use of insulin: Secondary | ICD-10-CM | POA: Diagnosis not present

## 2022-06-05 DIAGNOSIS — M1612 Unilateral primary osteoarthritis, left hip: Secondary | ICD-10-CM | POA: Diagnosis present

## 2022-06-05 DIAGNOSIS — G9341 Metabolic encephalopathy: Secondary | ICD-10-CM | POA: Diagnosis present

## 2022-06-05 DIAGNOSIS — M797 Fibromyalgia: Secondary | ICD-10-CM | POA: Diagnosis present

## 2022-06-05 DIAGNOSIS — Z9049 Acquired absence of other specified parts of digestive tract: Secondary | ICD-10-CM | POA: Diagnosis not present

## 2022-06-05 DIAGNOSIS — E1165 Type 2 diabetes mellitus with hyperglycemia: Secondary | ICD-10-CM | POA: Diagnosis present

## 2022-06-05 DIAGNOSIS — R55 Syncope and collapse: Secondary | ICD-10-CM | POA: Diagnosis present

## 2022-06-05 DIAGNOSIS — Z8673 Personal history of transient ischemic attack (TIA), and cerebral infarction without residual deficits: Secondary | ICD-10-CM | POA: Diagnosis not present

## 2022-06-05 DIAGNOSIS — I69398 Other sequelae of cerebral infarction: Secondary | ICD-10-CM | POA: Diagnosis not present

## 2022-06-05 DIAGNOSIS — I10 Essential (primary) hypertension: Secondary | ICD-10-CM | POA: Diagnosis present

## 2022-06-05 DIAGNOSIS — M25552 Pain in left hip: Secondary | ICD-10-CM | POA: Diagnosis not present

## 2022-06-05 DIAGNOSIS — R4182 Altered mental status, unspecified: Secondary | ICD-10-CM | POA: Diagnosis present

## 2022-06-05 DIAGNOSIS — I959 Hypotension, unspecified: Secondary | ICD-10-CM | POA: Diagnosis present

## 2022-06-05 DIAGNOSIS — W19XXXA Unspecified fall, initial encounter: Secondary | ICD-10-CM | POA: Diagnosis present

## 2022-06-05 DIAGNOSIS — S3210XA Unspecified fracture of sacrum, initial encounter for closed fracture: Secondary | ICD-10-CM | POA: Diagnosis present

## 2022-06-05 DIAGNOSIS — Z79899 Other long term (current) drug therapy: Secondary | ICD-10-CM | POA: Diagnosis not present

## 2022-06-05 DIAGNOSIS — Z7401 Bed confinement status: Secondary | ICD-10-CM | POA: Diagnosis not present

## 2022-06-05 DIAGNOSIS — Z7902 Long term (current) use of antithrombotics/antiplatelets: Secondary | ICD-10-CM | POA: Diagnosis not present

## 2022-06-05 DIAGNOSIS — E860 Dehydration: Secondary | ICD-10-CM | POA: Diagnosis present

## 2022-06-05 DIAGNOSIS — Z87891 Personal history of nicotine dependence: Secondary | ICD-10-CM | POA: Diagnosis not present

## 2022-06-05 DIAGNOSIS — Z885 Allergy status to narcotic agent status: Secondary | ICD-10-CM | POA: Diagnosis not present

## 2022-06-05 DIAGNOSIS — Z95828 Presence of other vascular implants and grafts: Secondary | ICD-10-CM | POA: Diagnosis not present

## 2022-06-05 DIAGNOSIS — N390 Urinary tract infection, site not specified: Secondary | ICD-10-CM | POA: Diagnosis not present

## 2022-06-05 DIAGNOSIS — Z7982 Long term (current) use of aspirin: Secondary | ICD-10-CM | POA: Diagnosis not present

## 2022-06-05 DIAGNOSIS — R569 Unspecified convulsions: Secondary | ICD-10-CM | POA: Diagnosis present

## 2022-06-05 LAB — BASIC METABOLIC PANEL
Anion gap: 10 (ref 5–15)
BUN: 15 mg/dL (ref 8–23)
CO2: 26 mmol/L (ref 22–32)
Calcium: 9 mg/dL (ref 8.9–10.3)
Chloride: 95 mmol/L — ABNORMAL LOW (ref 98–111)
Creatinine, Ser: 0.5 mg/dL (ref 0.44–1.00)
GFR, Estimated: >60 mL/min (ref >60)
Glucose, Bld: 191 mg/dL — ABNORMAL HIGH (ref 70–99)
Potassium: 3.6 mmol/L (ref 3.5–5.1)
Sodium: 131 mmol/L — ABNORMAL LOW (ref 135–145)

## 2022-06-05 LAB — CBC
HCT: 30.1 % — ABNORMAL LOW (ref 36.0–46.0)
Hemoglobin: 10.9 g/dL — ABNORMAL LOW (ref 12.0–15.0)
MCH: 31.1 pg (ref 26.0–34.0)
MCHC: 36.2 g/dL — ABNORMAL HIGH (ref 30.0–36.0)
MCV: 85.8 fL (ref 80.0–100.0)
Platelets: 282 10*3/uL (ref 150–400)
RBC: 3.51 MIL/uL — ABNORMAL LOW (ref 3.87–5.11)
RDW: 12.8 % (ref 11.5–15.5)
WBC: 7 10*3/uL (ref 4.0–10.5)
nRBC: 0 % (ref 0.0–0.2)

## 2022-06-05 LAB — GLUCOSE, CAPILLARY
Glucose-Capillary: 178 mg/dL — ABNORMAL HIGH (ref 70–99)
Glucose-Capillary: 216 mg/dL — ABNORMAL HIGH (ref 70–99)
Glucose-Capillary: 211 mg/dL — ABNORMAL HIGH (ref 70–99)
Glucose-Capillary: 173 mg/dL — ABNORMAL HIGH (ref 70–99)

## 2022-06-05 MED ORDER — INSULIN GLARGINE-YFGN 100 UNIT/ML ~~LOC~~ SOLN
7.0000 [IU] | Freq: Every day | SUBCUTANEOUS | Status: DC
  Administered 2022-06-05: 7 [IU] via SUBCUTANEOUS
  Filled 2022-06-05 (×2): qty 0.07

## 2022-06-05 MED ORDER — DULOXETINE HCL 60 MG PO CPEP
60.0000 mg | ORAL_CAPSULE | Freq: Every day | ORAL | Status: DC
  Administered 2022-06-05: 60 mg via ORAL
  Filled 2022-06-05: qty 1

## 2022-06-05 MED ORDER — ORAL CARE MOUTH RINSE: 15.0000 mL | OROMUCOSAL | Status: DC | PRN

## 2022-06-05 MED ORDER — GABAPENTIN 300 MG PO CAPS
300.0000 mg | ORAL_CAPSULE | Freq: Three times a day (TID) | ORAL | Status: DC
  Administered 2022-06-05 – 2022-06-06 (×4): 300 mg via ORAL
  Filled 2022-06-05 (×4): qty 1

## 2022-06-05 NOTE — Evaluation (Signed)
Occupational Therapy Evaluation Patient Details Name: Veronica Murray MRN: [id number] DOB: [birth year] Today's Date: 06/05/2022   History of Present Illness 66 yo F with recent stroke history was admitted on 7/24 for a syncopal convulsive event.  Has hypotension and a subacute sacral fracture, that resulted from a bathroom fall a month ago.  Had been to rehab after admission from ruptured carotid endarterectomy in April 2023, dc this month.  PMHx:  intubation post ruptured carotid endarterectomy, atherosclerosis, encephalopathy, DM, fibromyalgia, tobacco use, stroke, HTN, heart cath, cholecystectomy, hernia repair   Clinical Impression   Pt reports having been able to walk without AD and manage stairs while in rehab at SNF. She also reports being able to perform basic ADLs prior to return home. Per chart, pt had been in bed since discharge from SNF on 05/31/22. Pt presents with impaired cognition, generalized weakness, dizziness with hypotension which resolved once OOB in chair, and decreased balance. Pt requires up to min assist for ADLs and mobility. Per MD, family is not able to care for pt and home situation is not stable. Pt will require SNF, likely long term placement.      Recommendations for follow up therapy are one component of a multi-disciplinary discharge planning process, led by the attending physician.  Recommendations may be updated based on patient status, additional functional criteria and insurance authorization.   Follow Up Recommendations  Skilled nursing-short term rehab (<3 hours/day)    Assistance Recommended at Discharge Frequent or constant Supervision/Assistance  Patient can return home with the following A little help with walking and/or transfers;A little help with bathing/dressing/bathroom;Assistance with cooking/housework;Direct supervision/assist for medications management;Direct supervision/assist for financial management;Assist for transportation;Help with stairs  or ramp for entrance    Functional Status Assessment  Patient has had a recent decline in their functional status and demonstrates the ability to make significant improvements in function in a reasonable and predictable amount of time.  Equipment Recommendations  Other (comment) (defer to next venue)    Recommendations for Other Services       Precautions / Restrictions Precautions Precautions: Fall Precaution Comments: subacute sacral fracture Restrictions Weight Bearing Restrictions: No      Mobility Bed Mobility Overal bed mobility: Needs Assistance Bed Mobility: Rolling, Sidelying to Sit, Sit to Sidelying Rolling: Min guard Sidelying to sit: Min assist     Sit to sidelying: Min guard General bed mobility comments: cues for log roll technique, assist to raise trunk    Transfers Overall transfer level: Needs assistance Equipment used: Rolling walker (2 wheels), 1 person hand held assist Transfers: Sit to/from Stand Sit to Stand: Min assist, +2 physical assistance, +2 safety/equipment           General transfer comment: min assist to rise and steady, allowed hand on RW      Balance Overall balance assessment: Needs assistance Sitting-balance support: Feet supported Sitting balance-Leahy Scale: Fair     Standing balance support: Bilateral upper extremity supported, During functional activity Standing balance-Leahy Scale: Poor                             ADL either performed or assessed with clinical judgement   ADL Overall ADL's : Needs assistance/impaired Eating/Feeding: Set up;Sitting   Grooming: Wash/dry hands;Wash/dry face;Sitting;Supervision/safety   Upper Body Bathing: Minimal assistance;Sitting   Lower Body Bathing: Minimal assistance;Sit to/from stand   Upper Body Dressing : Minimal assistance;Sitting   Lower Body Dressing: Minimal assistance;Sit to/from  stand   Toilet Transfer: Minimal assistance;Stand-pivot;BSC/3in1;Rolling  walker (2 wheels)   Toileting- Clothing Manipulation and Hygiene: Minimal assistance;Sitting/lateral lean               Vision Ability to See in Adequate Light: 0 Adequate Patient Visual Report: No change from baseline       Perception     Praxis      Pertinent Vitals/Pain Pain Assessment Pain Assessment: Faces Faces Pain Scale: Hurts little more Pain Location: chronic back pain, left hip Pain Descriptors / Indicators: Guarding, Tender Pain Intervention(s): Monitored during session, Repositioned     Hand Dominance Right   Extremity/Trunk Assessment Upper Extremity Assessment Upper Extremity Assessment: Generalized weakness   Lower Extremity Assessment Lower Extremity Assessment: Defer to PT evaluation LLE Deficits / Details: L hip pain to move in all directions but can WB   Cervical / Trunk Assessment Cervical / Trunk Assessment: Other exceptions (subacute sacral fx)   Communication Communication Communication: No difficulties   Cognition Arousal/Alertness: Awake/alert Behavior During Therapy: Flat affect Overall Cognitive Status: Impaired/Different from baseline Area of Impairment: Memory, Awareness, Problem solving                   Current Attention Level: Sustained Memory: Decreased short-term memory, Decreased recall of precautions Following Commands: Follows one step commands with increased time   Awareness: Intellectual Problem Solving: Slow processing, Requires verbal cues, Requires tactile cues General Comments: encouragement for OOB activities, educated in benefits     General Comments  88/63 in sitting after standing, 132/60 up in chair    Exercises     Shoulder Instructions      Home Living Family/patient expects to be discharged to:: Private residence Living Arrangements: Spouse/significant other Available Help at Discharge: Family;Available 24 hours/day Type of Home: House Home Access: Level entry     Home Layout: One  level     Bathroom Shower/Tub: Tub/shower unit;Walk-in shower   Bathroom Toilet: Standard Bathroom Accessibility: Yes   Home Equipment: Rollator (4 wheels);BSC/3in1   Additional Comments: husband has been assisting her at home, pt has been in bed since she came home from SNF on 05/31/22      Prior Functioning/Environment Prior Level of Function : Needs assist             Mobility Comments: was independent and off AD prior to this admission ADLs Comments: pt reports she was able to complete basic ADLs at SNF        OT Problem List: Impaired balance (sitting and/or standing);Decreased knowledge of use of DME or AE;Decreased cognition;Decreased safety awareness;Decreased strength;Pain      OT Treatment/Interventions: Self-care/ADL training;DME and/or AE instruction;Therapeutic activities;Cognitive remediation/compensation;Patient/family education;Balance training    OT Goals(Current goals can be found in the care plan section) Acute Rehab OT Goals OT Goal Formulation: With patient Time For Goal Achievement: 06/19/22 Potential to Achieve Goals: Good ADL Goals Pt Will Perform Grooming: with supervision;standing Pt Will Perform Lower Body Bathing: with supervision;sit to/from stand Pt Will Perform Lower Body Dressing: with supervision;sit to/from stand Pt Will Transfer to Toilet: with supervision;ambulating;bedside commode Pt Will Perform Toileting - Clothing Manipulation and hygiene: with supervision;sit to/from stand Additional ADL Goal #1: Pt will perform bed mobility with supervision in preparation for ADLs.  OT Frequency: Min 2X/week    Co-evaluation              AM-PAC OT "6 Clicks" Daily Activity     Outcome Measure Help from another person eating meals?: None  Help from another person taking care of personal grooming?: A Little Help from another person toileting, which includes using toliet, bedpan, or urinal?: A Little Help from another person bathing  (including washing, rinsing, drying)?: A Little Help from another person to put on and taking off regular upper body clothing?: A Little Help from another person to put on and taking off regular lower body clothing?: A Little 6 Click Score: 19   End of Session Equipment Utilized During Treatment: Rolling walker (2 wheels);Gait belt  Activity Tolerance: Patient tolerated treatment well Patient left: in chair;with call bell/phone within reach;with chair alarm set  OT Visit Diagnosis: Unsteadiness on feet (R26.81);Other abnormalities of gait and mobility (R26.89);Other symptoms and signs involving cognitive function                Time: [id number] OT Time Calculation (min): 28 min Charges:  OT General Charges $OT Visit: 1 Visit OT Evaluation $OT Eval Moderate Complexity: [place], OTR/L Acute Rehabilitation Services Office: [phone]   Malka So 06/05/2022, 1:38 PM

## 2022-06-05 NOTE — NC FL2 (Signed)
[place] LEVEL OF CARE SCREENING TOOL     IDENTIFICATION  Patient Name: Veronica Murray Birthdate: 1956/09/15 Sex: female Admission Date (Current Location): 06/03/2022  [facility] and [place] Number:  Herbalist and Address:  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   Dehydration    Acute delirium 05/05/2022   Acute encephalopathy 05/05/2022   Viral gastroenteritis 05/04/2022   Hip pain 03/06/2022   CVA (cerebral vascular accident) ([place]) 03/05/2022   DM2 (diabetes mellitus, type 2) ([place]) 03/05/2022   Hypokalemia 03/05/2022   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   Normocytic anemia 03/05/2022   Leukocytosis 03/05/2022   Hyponatremia 03/05/2022   Rupture of carotid artery (HCC)    Adjustment disorder with mixed anxiety and depressed mood 01/25/2022   Carotid stenosis, left 01/24/2022   Carotid stenosis 01/18/2022   CAD (coronary artery disease) 01/18/2022   Essential hypertension 01/18/2022    Orientation RESPIRATION BLADDER Height & Weight     Self, Situation, Place  Normal Incontinent, External catheter Weight:   Height:     BEHAVIORAL SYMPTOMS/MOOD NEUROLOGICAL BOWEL NUTRITION STATUS      Continent Diet (See dc summary)  AMBULATORY STATUS COMMUNICATION OF NEEDS Skin   Limited Assist Verbally Normal                       Personal Care Assistance Level of Assistance  Bathing, Feeding,  Dressing Bathing Assistance: Limited assistance Feeding assistance: Limited assistance Dressing Assistance: Limited assistance     Functional Limitations Info  Sight, Hearing, Speech Sight Info: Adequate Hearing Info: Adequate Speech Info: Adequate    SPECIAL CARE FACTORS FREQUENCY  PT (By licensed PT), OT (By licensed OT)     PT Frequency: 5x/week OT Frequency: 5x/week            Contractures Contractures Info: Not present    Additional Factors Info  Code Status, Allergies Code Status Info: Full Allergies Info: Codeine, Toradol (Ketorolac Tromethamine), Tramadol Hcl           Current Medications (06/05/2022):  This is the current hospital active medication list Current Facility-Administered Medications  Medication Dose Route Frequency Provider Last Rate Last Admin   acetaminophen (TYLENOL) tablet 1,000 mg  1,000 mg Oral Q8H Virl Axe, MD   1,000 mg at 06/05/22 1508   aspirin EC tablet 81 mg  81 mg Oral Daily Virl Axe, MD   81 mg at 06/05/22 3491   chlorhexidine gluconate (MEDLINE KIT) (PERIDEX) 0.12 % solution 15 mL  15 mL Mouth Rinse BID Virl Axe, MD       DULoxetine (CYMBALTA) DR capsule 60 mg  60 mg Oral QHS Romana Juniper, MD       enoxaparin (LOVENOX) injection 40 mg  40 mg Subcutaneous Q24H Virl Axe, MD   40 mg at 79/15/05 6979   folic acid (FOLVITE) tablet 1 mg  1 mg Oral Daily Virl Axe, MD  1 mg at 06/05/22 0953   gabapentin (NEURONTIN) capsule 300 mg  300 mg Oral TID Romana Juniper, MD   300 mg at 06/05/22 1702   insulin aspart (novoLOG) injection 0-9 Units  0-9 Units Subcutaneous TID WC Virl Axe, MD   3 Units at 06/05/22 1708   insulin glargine-yfgn (SEMGLEE) injection 7 Units  7 Units Subcutaneous QHS Romana Juniper, MD       multivitamin with minerals tablet 1 tablet  1 tablet Oral Daily Virl Axe, MD   1 tablet at 06/05/22 1250   Oral care mouth rinse  15 mL Mouth Rinse 10 times per day  Virl Axe, MD   15 mL at 06/05/22 1508   oxyCODONE (Oxy IR/ROXICODONE) immediate release tablet 5 mg  5 mg Oral Q6H PRN Virl Axe, MD   5 mg at 06/05/22 1702   pantoprazole (PROTONIX) EC tablet 40 mg  40 mg Oral Daily Virl Axe, MD   40 mg at 06/05/22 0953   rosuvastatin (CRESTOR) tablet 10 mg  10 mg Oral Daily Virl Axe, MD   10 mg at 06/05/22 8719     Discharge Medications: Please see discharge summary for a list of discharge medications.  Relevant Imaging Results:  Relevant Lab Results:   Additional Information SSN: [id number]  Benard Halsted, LCSW

## 2022-06-05 NOTE — Progress Notes (Addendum)
Subjective:  Her hip is hurting a lot, she is not able to move it much and cannot stand on it. Her fall was about a month ago but did not see a Careers adviser at that time per patient. No belly pain or pain when she urinates. No further episodes of shaking or losing consciousness.   States she used to eat a lot but things started changing about 3 weeks ago. Has not been eating much since that time.  Objective:  Vital signs in last 24 hours: Vitals:   06/04/22 1607 06/04/22 2000 06/05/22 0400 06/05/22 0747  BP: 125/66 114/67 127/73 (!) 98/47  Pulse:    82  Resp: 17  15 16   Temp: 97.8 F (36.6 C) 98 F (36.7 C) 97.7 F (36.5 C) 98.2 F (36.8 C)  TempSrc: Oral Oral Oral Oral  SpO2: 96%  96%    Weight change:   Intake/Output Summary (Last 24 hours) at 06/05/2022 0941 Last data filed at 06/05/2022 0600 Gross per 24 hour  Intake 0 ml  Output --  Net 0 ml   Constitutional:      General: Confused appearing, resting comfortably in bed. Not in acute distress.    Appearance: Normal appearance.  HENT:     Head: Normocephalic and atraumatic.    Nose: Nose normal.  Eyes:     Conjunctiva/sclera: Conjunctivae normal.  Cardiovascular:     Rate and Rhythm: Normal rate and regular rhythm.  Pulmonary:     Effort: Pulmonary effort is normal. No respiratory distress.     Breath sounds: Normal breath sounds. No wheezing.  Abdomen: Soft abdomen with suprapubic tenderness. Normal BS Musculoskeletal:        General: No deformity. No tenderness to palpation. No ecchymosis.  Skin:    Findings: No rash.  Neurological:     Mental Status: She is alert. Oriented to person, place, and event. Psychological: Mood is labile.  Assessment/Plan:  Principal Problem:   Convulsive syncope Active Problems:   UTI (urinary tract infection)   Hypotension   Veronica Murray is a 66 y.o. female with PMH of  DM, HTN, fibromyalgia, with recent stroke on 02/2022 admitted for altered mental status following two  episodes of syncope with whole body shaking in setting of nausea/diarrhea prodrome and persistent L hip pain, non wt. wearing.   Suspected Convulsive Syncope Altered Mental Status  Hypotension  Pt presenting with transient episodes of altered mental status associated with loss of consciousness and whole body shaking per husband. History limited by patient's confusion and husband poor historian. Concerned for encephalopathy due to UTI and dehydration due to diarrhea, vs seizures and post ictal state with negative EEG overnight. UDS negative. CT head and MRI brain showing stable watershed infarcts in the bilateral frontal lobes and left parietal lobe. Pt's BP transiently drops to 90/60s and in the setting of symptoms with positional changes and prodrome of nausea concern for convulsive syncope. Pt is not orthostatic at this time. Pt previously on gabapentin 300mg  unsure of medication compliance less likely gabapentin induced myoclonus. - Appreciate Neurology recommendations - Hydrate with IVF - Continue ceftriaxone IV 1gm daily for 5 days for UTI - Seizure precautions - Will not start any AED's at this time. If patient noted to have seizure like activity please call neurology.  - Neurology will continue to follow patient  - PT/OT   UTI Pt endorsing dysuria. UA infectious appearing with budding yeast present. Concerning for encephalopathy given altered mental status and dehydration. Pt's  mentation improved today. - Urine cultures - Ceftriaxone IV 1mg  day 3 of 5 days - Diflucan 200mg  for 3 days   Left Hip Pain Pt reporting left hip pain for 1 month and reports a fall. Pt without tenderness to palpation. Pt unable to bear weight or tolerate a seated position. Pt with XR L hip on 05/03/22 that did not show a fracture. Pt seen on 07/03 for left hip pain with CT hip that showed a subacute fracture of the sacrum at the S3 level and moderate osteoarthritis. Repeat imaging unchanged from prior. Given S3  fracture possible nerve root irritation. Pt's mentation improved today but concern pain could be driving some of the confusion. Concern to start high dose steroids due to risk of worsening confusion. - Gabapentin and NSAIDS - Increase Cymbalta dose - PT/OT - Appreciates Ortho recommendations - Tylenol and Oxycodone 5mg  PRN for pain q6h   Stroke Pt with recent history of stroke in 02/2022 watershed infarcts in the bilateral frontal lobes and left parietal lobe. Stable on CT. MRI brain with no acute process. Stable chronic infarcts. - Continue ASA - Continue Crestor   Diabetes Mellitus Type 2 Pt with diagnosis of DM T2. CBG 199. Will monitor blood glucose. - CBG monitoring - SSI - Semglee 7 units   HTN Pt blood pressure currently 90/60s at times. Concerning given her episodes of loss of consciousness during position changes. Will continue to monitor. - Negative orthostatics    Deconditioning  Pt has had increasing difficulty with IADLS following stroke per husband. Husband is disabled and does not feel he is able to take care of patient. Pt is not able to drive. Husband agreeable to SNF if deemed appropriate. Husband feels she needs more rehabilitation. Spoke with husband today who is saying pt is in between homes in which she does not have a permanent residence. He states the children no longer engage with the patient and would not be amendable to providing housing. Will engage case work. -PT/OT eval -Case work     [facility]   DIET: DYS 3 DVT PPX: Lovenox BOWEL: none CODE: FULL FAM COM: Spoke to husband on the phone today   LOS: 0 days   09/03, Medical Student 06/05/2022, 9:41 AM

## 2022-06-05 NOTE — Evaluation (Deleted)
Physical Therapy Evaluation Patient Details Name: LAQUANA VILLARI MRN: [id number] DOB: 09-Jan-1956 Today's Date: 06/05/2022  History of Present Illness  66 yo F with recent stroke history was admitted on 7/24 for a syncopal convulsive event.  Has hypotension and a subacute sacral fracture, that resulted from a bathroom fall a month ago.  Had been to rehab after admission from ruptured carotid endarterectomy in April 2023, dc this month.  PMHx:  intubation post ruptured carotid endarterectomy, atherosclerosis, encephalopathy, DM, fibromyalgia, tobacco use, stroke, HTN, heart cath, cholecystectomy, hernia repair  Clinical Impression  Pt was seen for progression from bed to chair after a brief time returning to sidelying on the bed.  Pt was initially very SOB to stand with walker, and ck of BP in sitting was 88/63.  Talked her into getting up to chair for recovery of her BP with mobility and was 132/60 after the transition.  Pt is motivated to get home but expresses concern about how well husband can help her.  Follow up with family for guidelines on the available help, wth HHPT planned, CNA for additional care help and check to see if pt has a regular RW.  Follow PT goals as outlined on POC below.     Recommendations for follow up therapy are one component of a multi-disciplinary discharge planning process, led by the attending physician.  Recommendations may be updated based on patient status, additional functional criteria and insurance authorization.  Follow Up Recommendations Home health PT Can patient physically be transported by private vehicle: No    Assistance Recommended at Discharge Frequent or constant Supervision/Assistance  Patient can return home with the following  A little help with bathing/dressing/bathroom;A little help with walking and/or transfers;Assistance with cooking/housework;Assist for transportation;Help with stairs or ramp for entrance    Equipment Recommendations  Rolling walker (2 wheels)  Recommendations for Other Services       Functional Status Assessment Patient has had a recent decline in their functional status and demonstrates the ability to make significant improvements in function in a reasonable and predictable amount of time.     Precautions / Restrictions Precautions Precautions: Fall Precaution Comments: subacute sacral fracture Restrictions Weight Bearing Restrictions: No      Mobility  Bed Mobility Overal bed mobility: Needs Assistance Bed Mobility: Rolling, Sidelying to Sit, Sit to Sidelying Rolling: Min assist Sidelying to sit: Min assist     Sit to sidelying: Min assist General bed mobility comments: pt is using body mechanics as prompted    Transfers Overall transfer level: Needs assistance Equipment used: Rolling walker (2 wheels), 1 person hand held assist Transfers: Sit to/from Stand Sit to Stand: Min assist, +2 physical assistance, +2 safety/equipment           General transfer comment: stands with 2 HHA or walker and one min assist with BP supporting better with mobility    Ambulation/Gait               General Gait Details: pt did transfer only  Careers information officer    Modified Rankin (Stroke Patients Only)       Balance Overall balance assessment: Needs assistance Sitting-balance support: Feet supported Sitting balance-Leahy Scale: Fair     Standing balance support: Bilateral upper extremity supported, During functional activity Standing balance-Leahy Scale: Poor  Pertinent Vitals/Pain Pain Assessment Pain Assessment: Faces Faces Pain Scale: Hurts little more Pain Location: chronic back pain, left hip Pain Descriptors / Indicators: Guarding, Tender Pain Intervention(s): Limited activity within patient's tolerance, Monitored during session, Premedicated before session, Repositioned    Home Living Family/patient  expects to be discharged to:: Private residence Living Arrangements: Spouse/significant other Available Help at Discharge: Family;Available 24 hours/day Type of Home: House Home Access: Level entry       Home Layout: One level Home Equipment: Rollator (4 wheels);BSC/3in1 Additional Comments: husband has been assisting her at home    Prior Function Prior Level of Function : Independent/Modified Independent             Mobility Comments: was independent and off AD prior to this admission       Hand Dominance   Dominant Hand: Right    Extremity/Trunk Assessment   Upper Extremity Assessment Upper Extremity Assessment: Defer to OT evaluation    Lower Extremity Assessment Lower Extremity Assessment: Generalized weakness LLE Deficits / Details: L hip pain to move in all directions but can WB    Cervical / Trunk Assessment Cervical / Trunk Assessment: Other exceptions (sacral fracture, subacute)  Communication   Communication: No difficulties  Cognition Arousal/Alertness: Awake/alert Behavior During Therapy: Flat affect Overall Cognitive Status: Within Functional Limits for tasks assessed Area of Impairment: Memory, Awareness, Problem solving                   Current Attention Level: Sustained Memory: Decreased short-term memory, Decreased recall of precautions Following Commands: Follows one step commands with increased time   Awareness: Intellectual Problem Solving: Slow processing, Requires verbal cues, Requires tactile cues          General Comments General comments (skin integrity, edema, etc.): Pt was checked for BP during mobility and sitting after standing was 88/63, then after t ransfer to chair and elevation of legs was 132/60    Exercises     Assessment/Plan    PT Assessment Patient needs continued PT services  PT Problem List Decreased strength;Decreased range of motion;Decreased activity tolerance;Decreased balance;Decreased  mobility;Decreased coordination;Cardiopulmonary status limiting activity;Pain       PT Treatment Interventions DME instruction;Gait training;Stair training;Functional mobility training;Therapeutic activities;Therapeutic exercise;Balance training;Neuromuscular re-education;Patient/family education    PT Goals (Current goals can be found in the Care Plan section)  Acute Rehab PT Goals Patient Stated Goal: to feel better, go home PT Goal Formulation: With patient Time For Goal Achievement: 06/19/22 Potential to Achieve Goals: Fair    Frequency Min 3X/week     Co-evaluation               AM-PAC PT "6 Clicks" Mobility  Outcome Measure Help needed turning from your back to your side while in a flat bed without using bedrails?: A Little Help needed moving from lying on your back to sitting on the side of a flat bed without using bedrails?: A Little Help needed moving to and from a bed to a chair (including a wheelchair)?: A Lot Help needed standing up from a chair using your arms (e.g., wheelchair or bedside chair)?: A Little Help needed to walk in hospital room?: Total Help needed climbing 3-5 steps with a railing? : Total 6 Click Score: 13    End of Session   Activity Tolerance: Patient limited by pain Patient left: in chair;with call bell/phone within reach;with chair alarm set Nurse Communication: Mobility status PT Visit Diagnosis: Unsteadiness on feet (R26.81);Muscle weakness (generalized) (M62.81);Pain Pain -  Right/Left: Left Pain - part of body: Hip    Time: 1051-1120 PT Time Calculation (min) (ACUTE ONLY): 29 min   Charges:   PT Evaluation $PT Eval Moderate Complexity: 1 Mod         Ivar Drape 06/05/2022, 12:27 PM  Samul Dada, PT PhD Acute Rehab Dept. Number: [facility] [id number] and [facility] [phone]

## 2022-06-05 NOTE — TOC Initial Note (Signed)
Transition of Care [facility]) - Initial/Assessment Note    Patient Details  Name: Veronica Murray MRN: [id number] Date of Birth: 16-Jan-1956  Transition of [facility]) CM/SW Contact:    Mearl Latin, LCSW Phone Number: 06/05/2022, 5:00 PM  Clinical Narrative:                 CSW received consult for possible SNF placement at time of discharge. CSW spoke with patient's spouse. He reported that patient discharged a few days ago from [facility] and he got her a hotel but they did not provide medication refills so patient ended up back at the hospital. CSW explained that patient would be in out of pocket copay days at SNF if they chose to do that again. He expressed understanding and stated patient cannot afford that which is why they discharged from [facility]. CSW explained that Medicaid would be the only thing that would cover long term SNF. He stated that he believes Phineas Semen started a Medicaid application. CSW reached out to [place] and they will check into it and let CSW know if there is an option to bill copays with a payment plan. Spouse stated that otherwise patient would have to return to a hotel and hopeful her medications would work this time around as [facility]   Skilled Nursing Rehab Facilities-   [email or web address]   Ratings out of 5 possible   Name Address  Phone # Quality Care Staffing Health Inspection Overall  [facility] [address] [place] [phone] 4 5 2 3   [facility]  [address], [place] [phone] 3 2 5 5   [facility] [address] [place], [address] [place] 3 1 1 1   [facility] [address] 3 2 4 4   [facility] [address] [phone] 1 1 2 1   [facility] [phone] [address] [phone] 2 1 4 3   [facility] [address], [address] [place] 5 2 3 4   [facility] [address] [facility]  [place] 5 2 2 3   [address] ([facility]) [address] [facility] 5 1 2 2   [facility] [phone], [phone] 4 1 2 1   [facility] [address], [facility] [phone] 4 1 2 1   [facility] [address] [facility], [address] 4 1 1 1   [phone] [address] [facility] 3 2 4 4           [facility] [address], [facility] [place]      [facility] [address] [phone] 4 2 3 3   [facility] [place] [address] [phone] 4 1 5 4   [address] [place] [facility] [address], [place] [phone] 2 1 1 1   [facility] [address], [place] [phone] 2 1 3 2           [address] (no [facility]) [address], [place] [phone] 4 5 5 5   [facility] (No [employer]) [address], [place] 3 1 4 3   [facility] (No [employer]HC) [address], [place] [phone] 5 5 5 5   [facility] [address], [employer] [phone] 3 2 4 4   [facility] [address] [facility] 1 1 2 1   [facility] [address] [phone] 2 1 1 1   [place] [address] [place], [phone]  5 2 4 5   [facility] [address], [address] [place] 3 1 1 1   [facility] [address] [place], [place] 2 1 2 1           [facility] [address] [place] [phone] 1 1 1 1   [place] [address]  [phone] 2 4 2 2   [facility] [place] [address], [address] [phone] 5 2 3 4   [facility] [place] [address] , [address] [place]al[place]an 2 1 1 1   [facility] (No Humana) [address], [facility] 2 1 3 2   [facility] [address] [phone] 3 1 1 1           [facility] [address] [place], [facility] 5 4 5 5   [facility])  [address] [facility] 2 2 3 3   [facility]) [address] [place] [phone] 3 2 4 4   [facility] [address], [facility] [address] 4 3 4 4   [address] [place], [facility] [place] 3 3 1 1   [facility]) [address] [place] [phone] 2 2 4 4        Barriers to Discharge: Insurance Authorization (in copay days)   Patient Goals and CMS Choice Patient states their goals for this hospitalization and ongoing recovery are:: Rehab CMS Medicare.gov Compare Post Acute Care list provided to:: Patient Represenative (must comment) Choice offered to / list presented to : Spouse  Expected Discharge Plan and Services   In-house Referral: Clinical Social Work   Post Acute Care Choice: Skilled Nursing Facility Living arrangements for the past 2 months: Hotel/Motel                                      Prior Living Arrangements/Services Living arrangements for the past 2 months: Hotel/Motel Lives with:: Self Patient language and need for interpreter reviewed:: Yes Do you feel safe going back to the place where you live?: Yes      Need for Family Participation in Patient Care: Yes (Comment) Care giver support system in place?: Yes (comment)   Criminal Activity/Legal Involvement Pertinent to Current Situation/Hospitalization: No - Comment as needed  Activities of Daily Living      Permission Sought/Granted Permission sought to share information with : Facility [address], Family Supports Permission granted to share information with : Yes, Verbal Permission Granted  Share Information with NAME: [place]  Permission granted to share info w AGENCY: SNFs  Permission granted to share info w Relationship: Spouse  Permission granted to share info w Contact Information: [phone]  Emotional Assessment Appearance:: Appears stated  age Attitude/Demeanor/Rapport: Unable to Assess Affect (typically observed): Unable to Assess Orientation: : Oriented to Self, Oriented to Place Alcohol / Substance Use: Not Applicable Psych Involvement: No (comment)  Admission diagnosis:  Altered mental status [R41.82] Altered mental status, unspecified altered mental status type [R41.82] Patient Active Problem List   Diagnosis Date Noted   UTI (urinary tract infection) 06/04/2022   Hypotension 06/04/2022   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   Dehydration    Acute delirium 05/05/2022   Acute encephalopathy 05/05/2022   Viral gastroenteritis 05/04/2022   Hip pain 03/06/2022   CVA (cerebral vascular accident) (HCC) 03/05/2022   DM2 (diabetes mellitus, type 2) (HCC) 03/05/2022   Hypokalemia 03/05/2022  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 [place] [address] Phone: [phone] Fax: [phone]     Social Determinants of Health (SDOH) Interventions    Readmission Risk Interventions     No data to display

## 2022-06-05 NOTE — Consult Note (Signed)
Reason for Consult:Left leg pain Referring Physician: Velna Ochs Time called: [id number] Time at bedside: [place] is an 66 y.o. female.  HPI: Mazzy fell about a month ago in her bathroom. She had immediate left leg pain and could not get up unassisted. She has been unable to bear weight on the leg since. She feels the pain is worse now than when it was acute. She says the pain is constant but worse when moving. She denies N/T.  Past Medical History:  Diagnosis Date   Aortic arch atherosclerosis ([place])    Diabetes mellitus without complication ([place])    Encephalopathy    Essential hypertension    Fibromyalgia    Stroke ([place])    Tobacco abuse     Past Surgical History:  Procedure Laterality Date   CAROTID PTA/STENT INTERVENTION Left 01/24/2022   Procedure: CAROTID PTA/STENT INTERVENTION;  Surgeon: Algernon Huxley, MD;  Location: [place] [facility];  Service: Cardiovascular;  Laterality: Left;   CAROTID PTA/STENT INTERVENTION Left 02/28/2022   Procedure: CAROTID PTA/STENT INTERVENTION;  Surgeon: Algernon Huxley, MD;  Location: [place];  Service: Cardiovascular;  Laterality: Left;   CERVICAL BIOPSY     CHOLECYSTECTOMY     ENDARTERECTOMY Left 01/25/2022   Procedure: ENDARTERECTOMY CAROTID, possible ligation;  Surgeon: Algernon Huxley, MD;  Location: [facility];  Service: Vascular;  Laterality: Left;   ENDARTERECTOMY Left 02/25/2022   Procedure: ENDARTERECTOMY CAROTID revision;  Surgeon: Evaristo Bury, MD;  Location: [facility];  Service: Vascular;  Laterality: Left;   HERNIA REPAIR     LEFT HEART CATH AND CORONARY ANGIOGRAPHY Left 07/05/2019   Procedure: LEFT HEART CATH AND CORONARY ANGIOGRAPHY;  Surgeon: Wellington Hampshire, MD;  Location: [facility];  Service: Cardiovascular;  Laterality: Left;    Family History  Problem Relation Age of Onset   CAD Neg Hx     Social History:  reports that she has quit smoking. Her smoking use included cigarettes. She  has a 75.00 pack-year smoking history. She has never used smokeless tobacco. She reports that she does not currently use alcohol. She reports that she does not currently use drugs after having used the following drugs: Marijuana. Frequency: 7.00 times per week.  Allergies:  Allergies  Allergen Reactions   Codeine Hives, Shortness Of Breath and Nausea Only   Toradol [Ketorolac Tromethamine] Hives, Shortness Of Breath and Nausea Only   Tramadol Hcl Hives, Shortness Of Breath and Nausea Only    "Conflicts with bipolar condition"    Medications: I have reviewed the patient's current medications.  Results for orders placed or performed during the hospital encounter of 06/03/22 (from the past 48 hour(s))  Troponin I (High Sensitivity)     Status: None   Collection Time: 06/03/22 11:58 AM  Result Value Ref Range   Troponin I (High Sensitivity) 13 <18 ng/L    Comment: (NOTE) Elevated high sensitivity troponin I (hsTnI) values and significant  changes across serial measurements may suggest ACS but many other  chronic and acute conditions are known to elevate hsTnI results.  Refer to the "Links" section for chest pain algorithms and additional  guidance. Performed at [facility], [place] [address], [place], [place] [address]   Rapid urine drug screen (hospital performed)     Status: None   Collection Time: 06/03/22  3:14 PM  Result Value Ref Range   Opiates NONE DETECTED NONE DETECTED   Cocaine NONE DETECTED NONE  DETECTED   Benzodiazepines NONE DETECTED NONE DETECTED   Amphetamines NONE DETECTED NONE DETECTED   Tetrahydrocannabinol NONE DETECTED NONE DETECTED   Barbiturates NONE DETECTED NONE DETECTED    Comment: (NOTE) DRUG SCREEN FOR MEDICAL PURPOSES ONLY.  IF CONFIRMATION IS NEEDED FOR ANY PURPOSE, NOTIFY LAB WITHIN 5 DAYS.  LOWEST DETECTABLE LIMITS FOR URINE DRUG SCREEN Drug Class                     Cutoff (ng/mL) Amphetamine and metabolites    1000 Barbiturate and  metabolites    200 Benzodiazepine                 200 Tricyclics and metabolites     300 Opiates and metabolites        300 Cocaine and metabolites        300 THC                            50 Performed at [facility], [address], [place], [place] [address]   TSH     Status: None   Collection Time: 06/03/22 11:25 PM  Result Value Ref Range   TSH 2.564 0.350 - 4.500 uIU/mL    Comment: Performed by a 3rd Generation assay with a functional sensitivity of <=0.01 uIU/mL. Performed at [facility], [address] [place], [place] [address]   Basic metabolic panel     Status: Abnormal   Collection Time: 06/04/22  4:32 AM  Result Value Ref Range   Sodium 131 (L) 135 - 145 mmol/L   Potassium 3.7 3.5 - 5.1 mmol/L   Chloride 94 (L) 98 - 111 mmol/L   CO2 28 22 - 32 mmol/L   Glucose, Bld 177 (H) 70 - 99 mg/dL    Comment: Glucose reference range applies only to samples taken after fasting for at least 8 hours.   BUN 28 (H) 8 - 23 mg/dL   Creatinine, Ser 7.61 0.44 - 1.00 mg/dL   Calcium 9.3 8.9 - 60.7 mg/dL   GFR, Estimated >37 >10 mL/min    Comment: (NOTE) Calculated using the CKD-EPI Creatinine Equation (2021)    Anion gap 9 5 - 15    Comment: Performed at [facility], [address], [place], [place] [address]  CBC with Differential/Platelet     Status: Abnormal   Collection Time: 06/04/22  4:32 AM  Result Value Ref Range   WBC 7.4 4.0 - 10.5 K/uL   RBC 3.62 (L) 3.87 - 5.11 MIL/uL   Hemoglobin 11.3 (L) 12.0 - 15.0 g/dL   HCT 85.4 (L) 62.7 - 03.5 %   MCV 87.6 80.0 - 100.0 fL   MCH 31.2 26.0 - 34.0 pg   MCHC 35.6 30.0 - 36.0 g/dL   RDW 00.9 38.1 - 82.9 %   Platelets 286 150 - 400 K/uL   nRBC 0.0 0.0 - 0.2 %   Neutrophils Relative % 41 %   Neutro Abs 3.0 1.7 - 7.7 K/uL   Lymphocytes Relative 50 %   Lymphs Abs 3.7 0.7 - 4.0 K/uL   Monocytes Relative 8 %   Monocytes Absolute 0.6 0.1 - 1.0 K/uL   Eosinophils Relative 1 %   Eosinophils Absolute 0.1 0.0 -  0.5 K/uL   Basophils Relative 0 %   Basophils Absolute 0.0 0.0 - 0.1 K/uL   Immature Granulocytes 0 %   Abs Immature  Granulocytes 0.02 0.00 - 0.07 K/uL    Comment: Performed at [facility], [place] [address] [place], [place] [address]  Glucose, capillary     Status: Abnormal   Collection Time: 06/04/22 11:51 AM  Result Value Ref Range   Glucose-Capillary 207 (H) 70 - 99 mg/dL    Comment: Glucose reference range applies only to samples taken after fasting for at least 8 hours.  Glucose, capillary     Status: Abnormal   Collection Time: 06/04/22  4:11 PM  Result Value Ref Range   Glucose-Capillary 260 (H) 70 - 99 mg/dL    Comment: Glucose reference range applies only to samples taken after fasting for at least 8 hours.  Glucose, capillary     Status: Abnormal   Collection Time: 06/04/22  9:15 PM  Result Value Ref Range   Glucose-Capillary 169 (H) 70 - 99 mg/dL    Comment: Glucose reference range applies only to samples taken after fasting for at least 8 hours.  CBC     Status: Abnormal   Collection Time: 06/05/22  2:45 AM  Result Value Ref Range   WBC 7.0 4.0 - 10.5 K/uL   RBC 3.51 (L) 3.87 - 5.11 MIL/uL   Hemoglobin 10.9 (L) 12.0 - 15.0 g/dL   HCT 30.1 (L) 36.0 - 46.0 %   MCV 85.8 80.0 - 100.0 fL   MCH 31.1 26.0 - 34.0 pg   MCHC 36.2 (H) 30.0 - 36.0 g/dL   RDW 12.8 11.5 - 15.5 %   Platelets 282 150 - 400 K/uL   nRBC 0.0 0.0 - 0.2 %    Comment: Performed at [facility], [place] [address] [place], [place] [id number]  Basic metabolic panel     Status: Abnormal   Collection Time: 06/05/22  2:45 AM  Result Value Ref Range   Sodium 131 (L) 135 - 145 mmol/L   Potassium 3.6 3.5 - 5.1 mmol/L   Chloride 95 (L) 98 - 111 mmol/L   CO2 26 22 - 32 mmol/L   Glucose, Bld 191 (H) 70 - 99 mg/dL    Comment: Glucose reference range applies only to samples taken after fasting for at least 8 hours.   BUN 15 8 - 23 mg/dL   Creatinine, Ser 0.50 0.44 - 1.00 mg/dL   Calcium 9.0 8.9 - 10.3  mg/dL   GFR, Estimated >60 >60 mL/min    Comment: (NOTE) Calculated using the CKD-EPI Creatinine Equation (2021)    Anion gap 10 5 - 15    Comment: Performed at [place] [address], [place], [place] [address]  Glucose, capillary     Status: Abnormal   Collection Time: 06/05/22  7:47 AM  Result Value Ref Range   Glucose-Capillary 178 (H) 70 - 99 mg/dL    Comment: Glucose reference range applies only to samples taken after fasting for at least 8 hours.    CT HIP LEFT WO CONTRAST  Result Date: 06/04/2022 CLINICAL DATA:  Hip trauma, fracture suspected, no prior imaging EXAM: CT OF THE LEFT HIP WITHOUT CONTRAST TECHNIQUE: Multidetector CT imaging of the left hip was performed according to the standard protocol. Multiplanar CT image reconstructions were also generated. RADIATION DOSE REDUCTION: This exam was performed according to the departmental dose-optimization program which includes automated exposure control, adjustment of the mA and/or kV according to patient size and/or use of iterative reconstruction technique. COMPARISON:  X-ray 05/05/2022, CT 05/13/2022 FINDINGS: Bones/Joint/Cartilage Again seen is a subacute  sacral fracture at the S3 level with patchy sclerosis (series 8, image 17), similar in appearance to the previous study. Osseous structures appear otherwise intact. No additional fractures. Hip joint alignment is maintained without dislocation. No evidence of femoral head avascular necrosis. Moderate osteoarthritis of the left hip. No appreciable hip joint effusion. Pubic symphysis and left SI joint intact without diastasis. Ligaments Suboptimally assessed by CT. Muscles and Tendons No acute musculotendinous abnormality by CT. Soft tissues No fluid collection or hematoma about the left hip. No left inguinal lymphadenopathy. Atherosclerotic vascular calcifications. IMPRESSION: 1. No acute osseous abnormality of the left hip. 2. Subacute sacral fracture at the S3 level, similar  in appearance to the previous study. 3. Moderate osteoarthritis of the left hip. Electronically Signed   By: Davina Poke D.O.   On: 06/04/2022 16:11   MR BRAIN WO CONTRAST  Result Date: 06/04/2022 CLINICAL DATA:  Mental status change, unknown cause. EXAM: MRI HEAD WITHOUT CONTRAST TECHNIQUE: Multiplanar, multiecho pulse sequences of the brain and surrounding structures were obtained without intravenous contrast. COMPARISON:  Head CT 06/03/2022 and MRI 03/02/2022 FINDINGS: The coronal T2 sequence is moderately motion degraded, and there is intermittent mild motion on other sequences. Brain: There is no evidence of an acute infarct, mass, midline shift, or extra-axial fluid collection. There is a moderate-sized chronic right MCA infarct involving the frontal lobe and insula with associated chronic blood products. There is also hemosiderin stained encephalomalacia involving the left frontal, left parietal, and left occipital lobes corresponding to MCA and border zone infarcts. Background mild chronic small vessel ischemia is again noted in the cerebral white matter. Mild global cerebral atrophy is within normal limits for age. Vascular: Major intracranial vascular flow voids are preserved. Skull and upper cervical spine: Unremarkable bone marrow signal. Sinuses/Orbits: Unremarkable orbits. Paranasal sinuses and mastoid air cells are clear. Other: None. IMPRESSION: 1. No acute intracranial abnormality. 2. Chronic bilateral cerebral infarcts. Electronically Signed   By: Logan Bores M.D.   On: 06/04/2022 15:48   EEG adult  Result Date: 06/03/2022 Lora Havens, MD     06/03/2022 10:32 PM Patient Name: JAQUITA MOSSHOLDER MRN: [id number] Epilepsy Attending: Lora Havens Referring Physician/Provider: August Albino, NP Date: 06/03/2022 Duration: 21.05 mins Patient history: 66yo F with ams. EEG to evaluate for seizure Level of alertness: Awake AEDs during EEG study: None Technical aspects: This EEG study  was done with scalp electrodes positioned according to the 10-20 International system of electrode placement. Electrical activity was acquired at a sampling rate of 500Hz  and reviewed with a high frequency filter of 70Hz  and a low frequency filter of 1Hz . EEG data were recorded continuously and digitally stored. Description: The posterior dominant rhythm consists of 8 Hz activity of moderate voltage (25-35 uV) seen predominantly in posterior head regions, symmetric and reactive to eye opening and eye closing. EEG showed intermittent generalized 3 to 6 Hz theta-delta slowing. Hyperventilation and photic stimulation were not performed.   ABNORMALITY - Intermittent slow, generalized IMPRESSION: This study is suggestive of mild diffuse encephalopathy, nonspecific etiology. No seizures or epileptiform discharges were seen throughout the recording. Lora Havens   DG Chest 2 View  Result Date: 06/03/2022 CLINICAL DATA:  Reason for Exam: ams Triage Note: Pt BIB [facility] from [facility]. Called out for patient not responding to verbal, just looking around. Released from rehab on Friday, pt was at rehab for a stroke. No meds since Friday, EXAM: CHEST - 2 VIEW COMPARISON:  None Available. FINDINGS:  The heart size and mediastinal contours are within normal limits. Both lungs are clear. The visualized skeletal structures are unremarkable. IMPRESSION: No acute cardiopulmonary process. Electronically Signed   By: Genevive Bi M.D.   On: 06/03/2022 18:01    Review of Systems  HENT:  Negative for ear discharge, ear pain, hearing loss and tinnitus.   Eyes:  Negative for photophobia and pain.  Respiratory:  Negative for cough and shortness of breath.   Cardiovascular:  Negative for chest pain.  Gastrointestinal:  Negative for abdominal pain, nausea and vomiting.  Genitourinary:  Negative for dysuria, flank pain, frequency and urgency.  Musculoskeletal:  Positive for arthralgias (Left leg). Negative for back  pain, myalgias and neck pain.  Neurological:  Negative for dizziness, numbness and headaches.  Hematological:  Does not bruise/bleed easily.  Psychiatric/Behavioral:  The patient is not nervous/anxious.    Blood pressure (!) 98/47, pulse 82, temperature 98.2 F (36.8 C), temperature source Oral, resp. rate 16, SpO2 96 %. Physical Exam Constitutional:      General: She is not in acute distress.    Appearance: She is well-developed. She is not diaphoretic.  HENT:     Head: Normocephalic and atraumatic.  Eyes:     General: No scleral icterus.       Right eye: No discharge.        Left eye: No discharge.     Conjunctiva/sclera: Conjunctivae normal.  Cardiovascular:     Rate and Rhythm: Normal rate and regular rhythm.  Pulmonary:     Effort: Pulmonary effort is normal. No respiratory distress.  Musculoskeletal:     Cervical back: Normal range of motion.     Comments: LLE No traumatic wounds, ecchymosis, or rash  Nontender, describes severe pain with motion but moves through full ROM without wincing, no pain with PROM, localizes pain to lateral thigh  No knee or ankle effusion  Knee stable to varus/ valgus and anterior/posterior stress  Sens DPN, SPN, TN intact  Motor EHL, ext, flex, evers 5/5  DP 1+, PT 1+, No significant edema  Skin:    General: Skin is warm and dry.  Neurological:     Mental Status: She is alert.  Psychiatric:        Mood and Affect: Mood normal.        Behavior: Behavior normal.     Assessment/Plan: LLE pain -- I have no reason to suspect hip joint arthropathy or bursal pathology. Given S3 fx would favor nerve root irritation as the likely culprit. Would start her on scheduled NSAID, neuroleptic such as Lyrica or gabapentin, and a high dose steroid burst if not contraindicated. No restriction on WB. Could consider MRI pelvis but don't think it would change initial management.    Freeman Caldron, PA-C Orthopedic Surgery [phone] 06/05/2022, 11:00  AM

## 2022-06-05 NOTE — Progress Notes (Signed)
Pt was seen for progression from bed to chair after a brief time returning to sidelying on the bed.  Pt was initially very SOB to stand with walker, and ck of BP in sitting was 88/63.  Talked her into getting up to chair for recovery of her BP with mobility and was 132/60 after the transition.  Pt is motivated to get home but expresses concern about how well husband can help her.  Follow up with family for guidelines on the available help, wth SNF planned, RW requested and will follow up with goals of acute PT as outlined below.  Pt is reported not to have a stable residence currently per her husband.  Will continue on with care focusing on BP and increasing general movement with gait and balance in standing.    06/05/22 1200  PT Visit Information  Last PT Received On 06/05/22  Assistance Needed +1  History of Present Illness 66 yo F with recent stroke history was admitted on 7/24 for a syncopal convulsive event.  Has hypotension and a subacute sacral fracture, that resulted from a bathroom fall a month ago.  Had been to rehab after admission from ruptured carotid endarterectomy in April 2023, dc this month.  PMHx:  intubation post ruptured carotid endarterectomy, atherosclerosis, encephalopathy, DM, fibromyalgia, tobacco use, stroke, HTN, heart cath, cholecystectomy, hernia repair  Subjective Data  Patient Stated Goal to feel better, go home  Precautions  Precautions Fall  Precaution Comments subacute sacral fracture  Restrictions  Weight Bearing Restrictions No  Pain Assessment  Pain Assessment Faces  Faces Pain Scale 4  Pain Location chronic back pain, left hip  Pain Descriptors / Indicators Guarding;Tender  Pain Intervention(s) Limited activity within patient's tolerance;Monitored during session;Premedicated before session;Repositioned  Cognition  Arousal/Alertness Awake/alert  Behavior During Therapy Flat affect  Overall Cognitive Status Within Functional Limits for tasks assessed   Area of Impairment Memory;Awareness;Problem solving  Current Attention Level Sustained  Memory Decreased short-term memory;Decreased recall of precautions  Following Commands Follows one step commands with increased time  Awareness Intellectual  Problem Solving Slow processing;Requires verbal cues;Requires tactile cues  Bed Mobility  Overal bed mobility Needs Assistance  Bed Mobility Rolling;Sidelying to Sit;Sit to Sidelying  Rolling Min assist  Sidelying to sit Min assist  Sit to sidelying Min assist  General bed mobility comments pt is using body mechanics as prompted  Transfers  Overall transfer level Needs assistance  Equipment used Rolling walker (2 wheels);1 person hand held assist  Transfers Sit to/from Stand  Sit to Stand Min assist;+2 physical assistance;+2 safety/equipment  General transfer comment stands with 2 HHA or walker and one min assist with BP supporting better with mobility  Ambulation/Gait  General Gait Details pt did transfer only  Balance  Overall balance assessment Needs assistance  Sitting-balance support Feet supported  Sitting balance-Leahy Scale Fair  Standing balance support Bilateral upper extremity supported;During functional activity  Standing balance-Leahy Scale Poor  General Comments  General comments (skin integrity, edema, etc.) 88/63 in sitting after standing, 132/60 up in chair  PT - End of Session  Activity Tolerance Patient limited by pain  Patient left in chair;with call bell/phone within reach;with chair alarm set  Nurse Communication Mobility status   PT - Assessment/Plan  PT Visit Diagnosis Unsteadiness on feet (R26.81);Muscle weakness (generalized) (M62.81);Pain  Pain - Right/Left Left  Pain - part of body Hip  PT Frequency (ACUTE ONLY) Min 3X/week  Follow Up Recommendations Skilled nursing-short term rehab (<3 hours/day)  Can patient  physically be transported by private vehicle No  Assistance recommended at discharge Frequent or  constant Supervision/Assistance  Patient can return home with the following A little help with bathing/dressing/bathroom;A little help with walking and/or transfers;Assistance with cooking/housework;Assist for transportation;Help with stairs or ramp for entrance  PT equipment Rolling walker (2 wheels)  AM-PAC PT "6 Clicks" Mobility Outcome Measure (Version 2)  Help needed turning from your back to your side while in a flat bed without using bedrails? 3  Help needed moving from lying on your back to sitting on the side of a flat bed without using bedrails? 3  Help needed moving to and from a bed to a chair (including a wheelchair)? 2  Help needed standing up from a chair using your arms (e.g., wheelchair or bedside chair)? 3  Help needed to walk in hospital room? 1  Help needed climbing 3-5 steps with a railing?  1  6 Click Score 13  Consider Recommendation of Discharge To: CIR/SNF/LTACH  Progressive Mobility  What is the highest level of mobility based on the progressive mobility assessment? Level 3 (Stands with assist) - Balance while standing  and cannot march in place  Activity Transferred from chair to bed  Acute Rehab PT Goals  PT Goal Formulation With patient  Time For Goal Achievement 06/19/22  Potential to Achieve Goals Fair  PT Time Calculation  PT Start Time (ACUTE ONLY) 1051  PT Stop Time (ACUTE ONLY) 1120  PT Time Calculation (min) (ACUTE ONLY) 29 min  PT General Charges  $$ ACUTE PT VISIT 1 Visit  PT Evaluation  $PT Eval Moderate Complexity 1 Mod   Samul Dada, PT PhD Acute Rehab Dept. Number: [facility] [id number] and [facility] [phone]

## 2022-06-06 ENCOUNTER — Other Ambulatory Visit (HOSPITAL_COMMUNITY): Payer: Self-pay

## 2022-06-06 ENCOUNTER — Encounter: Payer: Self-pay | Admitting: Neurology

## 2022-06-06 ENCOUNTER — Encounter (HOSPITAL_COMMUNITY): Payer: Self-pay | Admitting: Internal Medicine

## 2022-06-06 DIAGNOSIS — M25552 Pain in left hip: Secondary | ICD-10-CM

## 2022-06-06 DIAGNOSIS — I69398 Other sequelae of cerebral infarction: Secondary | ICD-10-CM | POA: Diagnosis not present

## 2022-06-06 DIAGNOSIS — R55 Syncope and collapse: Secondary | ICD-10-CM | POA: Diagnosis not present

## 2022-06-06 DIAGNOSIS — R4182 Altered mental status, unspecified: Secondary | ICD-10-CM | POA: Diagnosis not present

## 2022-06-06 DIAGNOSIS — Z794 Long term (current) use of insulin: Secondary | ICD-10-CM

## 2022-06-06 DIAGNOSIS — E119 Type 2 diabetes mellitus without complications: Secondary | ICD-10-CM

## 2022-06-06 DIAGNOSIS — I959 Hypotension, unspecified: Secondary | ICD-10-CM | POA: Diagnosis not present

## 2022-06-06 DIAGNOSIS — Z5989 Other problems related to housing and economic circumstances: Secondary | ICD-10-CM

## 2022-06-06 LAB — CBC
HCT: 31 % — ABNORMAL LOW (ref 36.0–46.0)
Hemoglobin: 11.1 g/dL — ABNORMAL LOW (ref 12.0–15.0)
MCH: 30.6 pg (ref 26.0–34.0)
MCHC: 35.8 g/dL (ref 30.0–36.0)
MCV: 85.4 fL (ref 80.0–100.0)
Platelets: 280 10*3/uL (ref 150–400)
RBC: 3.63 MIL/uL — ABNORMAL LOW (ref 3.87–5.11)
RDW: 12.7 % (ref 11.5–15.5)
WBC: 6.7 10*3/uL (ref 4.0–10.5)
nRBC: 0 % (ref 0.0–0.2)

## 2022-06-06 LAB — GLUCOSE, CAPILLARY
Glucose-Capillary: 143 mg/dL — ABNORMAL HIGH (ref 70–99)
Glucose-Capillary: 176 mg/dL — ABNORMAL HIGH (ref 70–99)

## 2022-06-06 LAB — BASIC METABOLIC PANEL WITH GFR
Anion gap: 9 (ref 5–15)
BUN: 9 mg/dL (ref 8–23)
CO2: 25 mmol/L (ref 22–32)
Calcium: 9.2 mg/dL (ref 8.9–10.3)
Chloride: 99 mmol/L (ref 98–111)
Creatinine, Ser: 0.49 mg/dL (ref 0.44–1.00)
GFR, Estimated: >60 mL/min
Glucose, Bld: 147 mg/dL — ABNORMAL HIGH (ref 70–99)
Potassium: 3.9 mmol/L (ref 3.5–5.1)
Sodium: 133 mmol/L — ABNORMAL LOW (ref 135–145)

## 2022-06-06 MED ORDER — OXYCODONE HCL 5 MG PO TABS
5.0000 mg | ORAL_TABLET | Freq: Four times a day (QID) | ORAL | 0 refills | Status: AC | PRN
  Filled 2022-06-06: qty 20, 5d supply, fill #0

## 2022-06-06 MED ORDER — DULOXETINE HCL 60 MG PO CPEP
60.0000 mg | ORAL_CAPSULE | Freq: Every day | ORAL | 0 refills | Status: DC
Start: 2022-06-06 — End: 2022-12-20
  Filled 2022-06-06: qty 30, 30d supply, fill #0

## 2022-06-06 NOTE — Progress Notes (Addendum)
Discharge paperwork reviewed with client at this time. No complaints of pain have been made at this time. Iv has been removed. 

## 2022-06-06 NOTE — Plan of Care (Signed)
Problem: Education: Goal: Expressions of having a comfortable level of knowledge regarding the disease process will increase 06/06/2022 1235 by Waynette Buttery, RN Outcome: Adequate for Discharge 06/06/2022 0750 by Waynette Buttery, RN Outcome: Progressing   Problem: Coping: Goal: Ability to adjust to condition or change in health will improve 06/06/2022 1235 by Waynette Buttery, RN Outcome: Adequate for Discharge 06/06/2022 0750 by Waynette Buttery, RN Outcome: Progressing Goal: Ability to identify appropriate support needs will improve 06/06/2022 1235 by Waynette Buttery, RN Outcome: Adequate for Discharge 06/06/2022 0750 by Waynette Buttery, RN Outcome: Progressing   Problem: Health Behavior/Discharge Planning: Goal: Compliance with prescribed medication regimen will improve 06/06/2022 1235 by Waynette Buttery, RN Outcome: Adequate for Discharge 06/06/2022 0750 by Waynette Buttery, RN Outcome: Progressing   Problem: Medication: Goal: Risk for medication side effects will decrease 06/06/2022 1235 by Waynette Buttery, RN Outcome: Adequate for Discharge 06/06/2022 0750 by Waynette Buttery, RN Outcome: Progressing   Problem: Clinical Measurements: Goal: Complications related to the disease process, condition or treatment will be avoided or minimized 06/06/2022 1235 by Waynette Buttery, RN Outcome: Adequate for Discharge 06/06/2022 0750 by Waynette Buttery, RN Outcome: Progressing Goal: Diagnostic test results will improve 06/06/2022 1235 by Waynette Buttery, RN Outcome: Adequate for Discharge 06/06/2022 0750 by Waynette Buttery, RN Outcome: Progressing   Problem: Safety: Goal: Verbalization of understanding the information provided will improve 06/06/2022 1235 by Waynette Buttery, RN Outcome: Adequate for Discharge 06/06/2022 0750 by Waynette Buttery, RN Outcome: Progressing   Problem: Self-Concept: Goal: Level of anxiety will decrease 06/06/2022 1235 by Waynette Buttery, RN Outcome: Adequate for Discharge 06/06/2022 0750 by  Waynette Buttery, RN Outcome: Progressing Goal: Ability to verbalize feelings about condition will improve 06/06/2022 1235 by Waynette Buttery, RN Outcome: Adequate for Discharge 06/06/2022 0750 by Waynette Buttery, RN Outcome: Progressing   Problem: Education: Goal: Knowledge of General Education information will improve Description: Including pain rating scale, medication(s)/side effects and non-pharmacologic comfort measures 06/06/2022 1235 by Waynette Buttery, RN Outcome: Adequate for Discharge 06/06/2022 0750 by Waynette Buttery, RN Outcome: Progressing   Problem: Health Behavior/Discharge Planning: Goal: Ability to manage health-related needs will improve 06/06/2022 1235 by Waynette Buttery, RN Outcome: Adequate for Discharge 06/06/2022 0750 by Waynette Buttery, RN Outcome: Progressing   Problem: Clinical Measurements: Goal: Ability to maintain clinical measurements within normal limits will improve 06/06/2022 1235 by Waynette Buttery, RN Outcome: Adequate for Discharge 06/06/2022 0750 by Waynette Buttery, RN Outcome: Progressing Goal: Will remain free from infection 06/06/2022 1235 by Waynette Buttery, RN Outcome: Adequate for Discharge 06/06/2022 0750 by Waynette Buttery, RN Outcome: Progressing Goal: Diagnostic test results will improve 06/06/2022 1235 by Waynette Buttery, RN Outcome: Adequate for Discharge 06/06/2022 0750 by Waynette Buttery, RN Outcome: Progressing Goal: Respiratory complications will improve 06/06/2022 1235 by Waynette Buttery, RN Outcome: Adequate for Discharge 06/06/2022 0750 by Waynette Buttery, RN Outcome: Progressing Goal: Cardiovascular complication will be avoided 06/06/2022 1235 by Waynette Buttery, RN Outcome: Adequate for Discharge 06/06/2022 0750 by Waynette Buttery, RN Outcome: Progressing   Problem: Activity: Goal: Risk for activity intolerance will decrease 06/06/2022 1235 by Waynette Buttery, RN Outcome: Adequate for Discharge 06/06/2022 0750 by Waynette Buttery, RN Outcome: Progressing    Problem: Nutrition: Goal: Adequate nutrition will be maintained 06/06/2022 1235 by Waynette Buttery, RN Outcome: Adequate for Discharge 06/06/2022 0750 by Minda Ditto,  Keshonna Valvo A, RN Outcome: Progressing   Problem: Coping: Goal: Level of anxiety will decrease 06/06/2022 1235 by Steaven Wholey A, RN Outcome: Adequate for Discharge 06/06/2022 0750 by Waynette Buttery, RN Outcome: Progressing   Problem: Elimination: Goal: Will not experience complications related to bowel motility 06/06/2022 1235 by Waynette Buttery, RN Outcome: Adequate for Discharge 06/06/2022 0750 by Waynette Buttery, RN Outcome: Progressing Goal: Will not experience complications related to urinary retention 06/06/2022 1235 by Waynette Buttery, RN Outcome: Adequate for Discharge 06/06/2022 0750 by Waynette Buttery, RN Outcome: Progressing   Problem: Pain Managment: Goal: General experience of comfort will improve 06/06/2022 1235 by Waynette Buttery, RN Outcome: Adequate for Discharge 06/06/2022 0750 by Waynette Buttery, RN Outcome: Progressing   Problem: Safety: Goal: Ability to remain free from injury will improve 06/06/2022 1235 by Waynette Buttery, RN Outcome: Adequate for Discharge 06/06/2022 0750 by Waynette Buttery, RN Outcome: Progressing   Problem: Skin Integrity: Goal: Risk for impaired skin integrity will decrease 06/06/2022 1235 by Waynette Buttery, RN Outcome: Adequate for Discharge 06/06/2022 0750 by Waynette Buttery, RN Outcome: Progressing   Problem: Education: Goal: Ability to describe self-care measures that may prevent or decrease complications (Diabetes Survival Skills Education) will improve 06/06/2022 1235 by Waynette Buttery, RN Outcome: Adequate for Discharge 06/06/2022 0750 by Waynette Buttery, RN Outcome: Progressing Goal: Individualized Educational Video(s) 06/06/2022 1235 by Waynette Buttery, RN Outcome: Adequate for Discharge 06/06/2022 0750 by Waynette Buttery, RN Outcome: Progressing   Problem: Coping: Goal: Ability to adjust to  condition or change in health will improve 06/06/2022 1235 by Waynette Buttery, RN Outcome: Adequate for Discharge 06/06/2022 0750 by Waynette Buttery, RN Outcome: Progressing   Problem: Fluid Volume: Goal: Ability to maintain a balanced intake and output will improve 06/06/2022 1235 by Waynette Buttery, RN Outcome: Adequate for Discharge 06/06/2022 0750 by Waynette Buttery, RN Outcome: Progressing   Problem: Health Behavior/Discharge Planning: Goal: Ability to identify and utilize available resources and services will improve 06/06/2022 1235 by Waynette Buttery, RN Outcome: Adequate for Discharge 06/06/2022 0750 by Waynette Buttery, RN Outcome: Progressing Goal: Ability to manage health-related needs will improve 06/06/2022 1235 by Waynette Buttery, RN Outcome: Adequate for Discharge 06/06/2022 0750 by Waynette Buttery, RN Outcome: Progressing   Problem: Metabolic: Goal: Ability to maintain appropriate glucose levels will improve 06/06/2022 1235 by Waynette Buttery, RN Outcome: Adequate for Discharge 06/06/2022 0750 by Waynette Buttery, RN Outcome: Progressing   Problem: Nutritional: Goal: Maintenance of adequate nutrition will improve 06/06/2022 1235 by Waynette Buttery, RN Outcome: Adequate for Discharge 06/06/2022 0750 by Waynette Buttery, RN Outcome: Progressing Goal: Progress toward achieving an optimal weight will improve 06/06/2022 1235 by Waynette Buttery, RN Outcome: Adequate for Discharge 06/06/2022 0750 by Waynette Buttery, RN Outcome: Progressing   Problem: Skin Integrity: Goal: Risk for impaired skin integrity will decrease 06/06/2022 1235 by Waynette Buttery, RN Outcome: Adequate for Discharge 06/06/2022 0750 by Waynette Buttery, RN Outcome: Progressing   Problem: Tissue Perfusion: Goal: Adequacy of tissue perfusion will improve 06/06/2022 1235 by Waynette Buttery, RN Outcome: Adequate for Discharge 06/06/2022 0750 by Waynette Buttery, RN Outcome: Progressing

## 2022-06-06 NOTE — Progress Notes (Signed)
Physical Therapy Treatment Patient Details Name: Veronica Murray MRN: [id number] DOB: 01-29-1956 Today's Date: 06/06/2022   History of Present Illness 66 yo F with recent stroke history was admitted on 7/24 for a syncopal convulsive event.  Has hypotension and a subacute sacral fracture, that resulted from a bathroom fall a month ago.  Had been to rehab after admission from ruptured carotid endarterectomy in April 2023, dc this month.  PMHx:  intubation post ruptured carotid endarterectomy, atherosclerosis, encephalopathy, DM, fibromyalgia, tobacco use, stroke, HTN, heart cath, cholecystectomy, hernia repair    PT Comments    Pt was seen for review of all exercises on LE's for use at home.  Has a significant improvement in pain today, with no complaints at all for exercise routine.  Pt is expecting to go to a hotel situation, and will have her husband helping her then.  Has a request submitted for public housing and will hopefully be safely housed soon.  Follow along for goals of PT as outlined on POC.   Recommendations for follow up therapy are one component of a multi-disciplinary discharge planning process, led by the attending physician.  Recommendations may be updated based on patient status, additional functional criteria and insurance authorization.  Follow Up Recommendations  Skilled nursing-short term rehab (<3 hours/day) Can patient physically be transported by private vehicle: No   Assistance Recommended at Discharge Frequent or constant Supervision/Assistance  Patient can return home with the following A little help with walking and/or transfers;A little help with bathing/dressing/bathroom;Assistance with cooking/housework;Direct supervision/assist for medications management;Direct supervision/assist for financial management;Assist for transportation;Help with stairs or ramp for entrance   Equipment Recommendations  Rolling walker (2 wheels)    Recommendations for Other Services        Precautions / Restrictions Precautions Precautions: Fall Precaution Comments: subacute sacral fracture Restrictions Weight Bearing Restrictions: No     Mobility  Bed Mobility Overal bed mobility: Needs Assistance             General bed mobility comments: declined OOB    Transfers                        Ambulation/Gait                   Stairs             Wheelchair Mobility    Modified Rankin (Stroke Patients Only)       Balance                                            Cognition Arousal/Alertness: Awake/alert Behavior During Therapy: WFL for tasks assessed/performed Overall Cognitive Status: Impaired/Different from baseline                     Current Attention Level: Sustained Memory: Decreased short-term memory, Decreased recall of precautions Following Commands: Follows one step commands with increased time   Awareness: Intellectual Problem Solving: Slow processing, Requires verbal cues, Requires tactile cues General Comments: declined OOB to chair but did perform exercises, did not have a reason        Exercises General Exercises - Lower Extremity Ankle Circles/Pumps: AAROM, 5 reps Quad Sets: AROM, 15 reps Gluteal Sets: AROM, 15 reps Heel Slides: AAROM, AROM, 15 reps Hip ABduction/ADduction: AROM, AAROM, 15 reps Straight Leg Raises: AROM, AAROM, 15 reps,  Right    General Comments General comments (skin integrity, edema, etc.): Pt had controlled sats and HR with all exercises but did not get up from the bed      Pertinent Vitals/Pain Pain Assessment Pain Assessment: No/denies pain    Home Living                          Prior Function            PT Goals (current goals can now be found in the care plan section) Acute Rehab PT Goals Patient Stated Goal: to feel better, go home    Frequency    Min 3X/week      PT Plan Current plan remains appropriate     Co-evaluation              AM-PAC PT "6 Clicks" Mobility   Outcome Measure  Help needed turning from your back to your side while in a flat bed without using bedrails?: A Little Help needed moving from lying on your back to sitting on the side of a flat bed without using bedrails?: A Little Help needed moving to and from a bed to a chair (including a wheelchair)?: A Lot Help needed standing up from a chair using your arms (e.g., wheelchair or bedside chair)?: A Little Help needed to walk in hospital room?: A Lot Help needed climbing 3-5 steps with a railing? : Total 6 Click Score: 14    End of Session   Activity Tolerance: Patient tolerated treatment well Patient left: in bed;with call bell/phone within reach Nurse Communication: Mobility status PT Visit Diagnosis: Unsteadiness on feet (R26.81);Muscle weakness (generalized) (M62.81);Pain     Time: 1430-1459 PT Time Calculation (min) (ACUTE ONLY): 29 min  Charges:  $Therapeutic Exercise: 23-37 mins        Ivar Drape 06/06/2022, 4:06 PM  Samul Dada, PT PhD Acute Rehab Dept. Number: [facility] [id number] and [facility] [phone]

## 2022-06-06 NOTE — Consult Note (Signed)
ORTHOPAEDIC CONSULTATION  REQUESTING PHYSICIAN: Reymundo Poll, MD  Chief Complaint: Hip pain  HPI: Veronica Murray is a 66 y.o. female who presents with left hip pain about the lateral aspect of the hip ongoing now for a month after she had a fall.  She denies any hip pain prior to this.  She has been seen in the emergency room where CT scan was obtained and did not show any acute fracture.  She is currently being treated for stroke and altered mental status.  Past Medical History:  Diagnosis Date   Aortic arch atherosclerosis (HCC)    Diabetes mellitus without complication (HCC)    Encephalopathy    Essential hypertension    Fibromyalgia    Stroke (HCC)    Tobacco abuse    Past Surgical History:  Procedure Laterality Date   CAROTID PTA/STENT INTERVENTION Left 01/24/2022   Procedure: CAROTID PTA/STENT INTERVENTION;  Surgeon: [name], MD;  Location: [facility];  Service: Cardiovascular;  Laterality: Left;   CAROTID PTA/STENT INTERVENTION Left 02/28/2022   Procedure: CAROTID PTA/STENT INTERVENTION;  Surgeon: [name], MD;  Location: [facility];  Service: Cardiovascular;  Laterality: Left;   CERVICAL BIOPSY     CHOLECYSTECTOMY     ENDARTERECTOMY Left 01/25/2022   Procedure: ENDARTERECTOMY CAROTID, possible ligation;  Surgeon: [name], MD;  Location: [facility] OR[facility];  Service: Vascular;  Laterality: Left;   ENDARTERECTOMY Left 02/25/2022   Procedure: ENDARTERECTOMY CAROTID revision;  Surgeon: Bertram Denver, MD;  Location: [facility] OR[facility];  Service: Vascular;  Laterality: Left;   HERNIA REPAIR     LEFT HEART CATH AND CORONARY ANGIOGRAPHY Left 07/05/2019   Procedure: LEFT HEART CATH AND CORONARY ANGIOGRAPHY;  Surgeon: Iran Ouch, MD;  Location: [facility];  Service: Cardiovascular;  Laterality: Left;   Social History   Socioeconomic History   Marital status: Married    Spouse name: Not on file   Number of children: Not on file   Years of  education: Not on file   Highest education level: Not on file  Occupational History   Not on file  Tobacco Use   Smoking status: Former    Packs/day: 1.50    Years: 50.00    Total pack years: 75.00    Types: Cigarettes   Smokeless tobacco: Never  Vaping Use   Vaping Use: Never used  Substance and Sexual Activity   Alcohol use: Not Currently   Drug use: Not Currently    Frequency: 7.0 times per week    Types: Marijuana   Sexual activity: Not on file  Other Topics Concern   Not on file  Social History Narrative   Not on file   Social Determinants of Health   Financial Resource Strain: Not on file  Food Insecurity: Not on file  Transportation Needs: Not on file  Physical Activity: Not on file  Stress: Not on file  Social Connections: Not on file   Family History  Problem Relation Age of Onset   CAD Neg Hx    - negative except otherwise stated in the family history section Allergies  Allergen Reactions   Codeine Hives, Shortness Of Breath and Nausea Only   Toradol [Ketorolac Tromethamine] Hives, Shortness Of Breath and Nausea Only   Tramadol Hcl Hives, Shortness Of Breath and Nausea Only    "Conflicts with bipolar condition"   Prior to Admission medications   Medication Sig Start Date End Date Taking? Authorizing Provider  ACETAMINOPHEN EXTRA STRENGTH 500 MG tablet Take 1,000 mg by mouth 2 (two) times daily. 05/30/22  Yes [provider]  aspirin EC 81 MG EC tablet Take 1 tablet (81 mg total) by mouth daily. Swallow whole. 01/27/22  Yes Esco, Miechia A, MD  clopidogrel (PLAVIX) 75 MG tablet Take 1 tablet (75 mg total) by mouth daily. 03/16/22  Yes Arnetha Courser, MD  DULoxetine (CYMBALTA) 30 MG capsule Take 30 mg by mouth at bedtime. 05/07/22  Yes [provider]  gabapentin (NEURONTIN) 300 MG capsule Take 300 mg by mouth 3 (three) times daily. 02/01/22  Yes [provider]  hydrOXYzine (ATARAX) 10 MG tablet Take 1 tablet (10 mg total) by mouth 3  (three) times daily as needed for anxiety or itching. 03/15/22  Yes Arnetha Courser, MD  insulin glargine (LANTUS SOLOSTAR) 100 UNIT/ML Solostar Pen Inject 5 Units into the skin daily. 05/08/22  Yes Doran Stabler, DO  Multiple Vitamin (MULTIVITAMIN WITH MINERALS) TABS tablet Take 1 tablet by mouth daily. 03/16/22  Yes Arnetha Courser, MD  ondansetron (ZOFRAN) 4 MG tablet Take 1 tablet (4 mg total) by mouth every 6 (six) hours as needed for nausea. Patient taking differently: Take 4 mg by mouth every 8 (eight) hours as needed for nausea or vomiting. 05/08/22  Yes Doran Stabler, DO  oxyCODONE-acetaminophen (PERCOCET/ROXICET) 5-325 MG tablet Take 1 tablet by mouth 4 (four) times daily as needed for severe pain. 04/16/22  Yes [provider]  pantoprazole sodium (PROTONIX) 40 mg Take 40 mg by mouth daily. 03/15/22  Yes Arnetha Courser, MD  rosuvastatin (CRESTOR) 10 MG tablet Take 1 tablet (10 mg total) by mouth daily. 01/18/22  Yes [name], MD  amoxicillin-clavulanate (AUGMENTIN) 875-125 MG tablet Take 1 tablet by mouth 2 (two) times daily. Patient not taking: Reported on 06/04/2022 04/02/22   Lynn Ito, MD  Insulin Pen Needle (NOVOFINE) 30G X 8 MM MISC Inject 10 each into the skin as needed. 06/26/19   Milagros Loll, MD   CT HIP LEFT WO CONTRAST  Result Date: 06/04/2022 CLINICAL DATA:  Hip trauma, fracture suspected, no prior imaging EXAM: CT OF THE LEFT HIP WITHOUT CONTRAST TECHNIQUE: Multidetector CT imaging of the left hip was performed according to the standard protocol. Multiplanar CT image reconstructions were also generated. RADIATION DOSE REDUCTION: This exam was performed according to the departmental dose-optimization program which includes automated exposure control, adjustment of the mA and/or kV according to patient size and/or use of iterative reconstruction technique. COMPARISON:  X-ray 05/05/2022, CT 05/13/2022 FINDINGS: Bones/Joint/Cartilage Again seen is a subacute sacral fracture  at the S3 level with patchy sclerosis (series 8, image 17), similar in appearance to the previous study. Osseous structures appear otherwise intact. No additional fractures. Hip joint alignment is maintained without dislocation. No evidence of femoral head avascular necrosis. Moderate osteoarthritis of the left hip. No appreciable hip joint effusion. Pubic symphysis and left SI joint intact without diastasis. Ligaments Suboptimally assessed by CT. Muscles and Tendons No acute musculotendinous abnormality by CT. Soft tissues No fluid collection or hematoma about the left hip. No left inguinal lymphadenopathy. Atherosclerotic vascular calcifications. IMPRESSION: 1. No acute osseous abnormality of the left hip. 2. Subacute sacral fracture at the S3 level, similar in appearance to the previous study. 3. Moderate osteoarthritis of the left hip. Electronically Signed   By: Duanne Guess D.O.   On: 06/04/2022 16:11   MR BRAIN WO CONTRAST  Result Date: 06/04/2022 CLINICAL DATA:  Mental status change, unknown cause.  EXAM: MRI HEAD WITHOUT CONTRAST TECHNIQUE: Multiplanar, multiecho pulse sequences of the brain and surrounding structures were obtained without intravenous contrast. COMPARISON:  Head CT 06/03/2022 and MRI 03/02/2022 FINDINGS: The coronal T2 sequence is moderately motion degraded, and there is intermittent mild motion on other sequences. Brain: There is no evidence of an acute infarct, mass, midline shift, or extra-axial fluid collection. There is a moderate-sized chronic right MCA infarct involving the frontal lobe and insula with associated chronic blood products. There is also hemosiderin stained encephalomalacia involving the left frontal, left parietal, and left occipital lobes corresponding to MCA and border zone infarcts. Background mild chronic small vessel ischemia is again noted in the cerebral white matter. Mild global cerebral atrophy is within normal limits for age. Vascular: Major  intracranial vascular flow voids are preserved. Skull and upper cervical spine: Unremarkable bone marrow signal. Sinuses/Orbits: Unremarkable orbits. Paranasal sinuses and mastoid air cells are clear. Other: None. IMPRESSION: 1. No acute intracranial abnormality. 2. Chronic bilateral cerebral infarcts. Electronically Signed   By: Sebastian Ache M.D.   On: 06/04/2022 15:48     Positive ROS: All other systems have been reviewed and were otherwise negative with the exception of those mentioned in the HPI and as above.  Physical Exam: General: No acute distress Cardiovascular: No pedal edema Respiratory: No cyanosis, no use of accessory musculature GI: No organomegaly, abdomen is soft and non-tender Skin: No lesions in the area of chief complaint Neurologic: Sensation intact distally Psychiatric: Patient is at baseline mood and affect Lymphatic: No axillary or cervical lymphadenopathy  MUSCULOSKELETAL:  Left hip pain tenderness about the greater trochanter particularly with resisted adduction.  She has no pain with 20 degrees internal/external rotation of the left hip.  She is actively able to flex up to 100 degrees without significant pain  Independent Imaging Review: X-ray AP pelvis, left hip 2 views, CT scan left hip: Within normal limits although there is mild osteoarthritic type findings  Assessment: 66 year old female with mild osteoarthritic findings of the left hip.  She may have an acute tearing versus tendinitis of the gluteus medius.  Overall I would recommend symptomatic treatment with Tylenol and continued anti-inflammatory.  I agree that a steroid if tolerated and able to provide some more acute pain relief.  I discussed that in the outpatient setting I could perform an ultrasound-guided steroid injection into this area to hopefully get her relief.  In the meantime she can work with physical therapy for some hip strengthening exercises to help with this.  She may be activity and  weightbearing as tolerated at this time  Plan: She will follow-up with me as an outpatient  Thank you for the consult and the opportunity to see Ms. Delphina Cahill, MD [facility] 6:35 AM

## 2022-06-06 NOTE — Plan of Care (Signed)
  Problem: Education: Goal: Expressions of having a comfortable level of knowledge regarding the disease process will increase Outcome: Progressing   Problem: Coping: Goal: Ability to adjust to condition or change in health will improve Outcome: Progressing Goal: Ability to identify appropriate support needs will improve Outcome: Progressing   Problem: Health Behavior/Discharge Planning: Goal: Compliance with prescribed medication regimen will improve Outcome: Progressing   Problem: Medication: Goal: Risk for medication side effects will decrease Outcome: Progressing   Problem: Clinical Measurements: Goal: Complications related to the disease process, condition or treatment will be avoided or minimized Outcome: Progressing Goal: Diagnostic test results will improve Outcome: Progressing   Problem: Safety: Goal: Verbalization of understanding the information provided will improve Outcome: Progressing   Problem: Self-Concept: Goal: Level of anxiety will decrease Outcome: Progressing Goal: Ability to verbalize feelings about condition will improve Outcome: Progressing   Problem: Education: Goal: Knowledge of General Education information will improve Description: Including pain rating scale, medication(s)/side effects and non-pharmacologic comfort measures Outcome: Progressing   Problem: Health Behavior/Discharge Planning: Goal: Ability to manage health-related needs will improve Outcome: Progressing   Problem: Clinical Measurements: Goal: Ability to maintain clinical measurements within normal limits will improve Outcome: Progressing Goal: Will remain free from infection Outcome: Progressing Goal: Diagnostic test results will improve Outcome: Progressing Goal: Respiratory complications will improve Outcome: Progressing Goal: Cardiovascular complication will be avoided Outcome: Progressing   Problem: Activity: Goal: Risk for activity intolerance will  decrease Outcome: Progressing   Problem: Nutrition: Goal: Adequate nutrition will be maintained Outcome: Progressing   Problem: Coping: Goal: Level of anxiety will decrease Outcome: Progressing   Problem: Elimination: Goal: Will not experience complications related to bowel motility Outcome: Progressing Goal: Will not experience complications related to urinary retention Outcome: Progressing   Problem: Pain Managment: Goal: General experience of comfort will improve Outcome: Progressing   Problem: Safety: Goal: Ability to remain free from injury will improve Outcome: Progressing   Problem: Skin Integrity: Goal: Risk for impaired skin integrity will decrease Outcome: Progressing   Problem: Education: Goal: Ability to describe self-care measures that may prevent or decrease complications (Diabetes Survival Skills Education) will improve Outcome: Progressing Goal: Individualized Educational Video(s) Outcome: Progressing   Problem: Coping: Goal: Ability to adjust to condition or change in health will improve Outcome: Progressing   Problem: Fluid Volume: Goal: Ability to maintain a balanced intake and output will improve Outcome: Progressing   Problem: Health Behavior/Discharge Planning: Goal: Ability to identify and utilize available resources and services will improve Outcome: Progressing Goal: Ability to manage health-related needs will improve Outcome: Progressing   Problem: Metabolic: Goal: Ability to maintain appropriate glucose levels will improve Outcome: Progressing   Problem: Nutritional: Goal: Maintenance of adequate nutrition will improve Outcome: Progressing Goal: Progress toward achieving an optimal weight will improve Outcome: Progressing   Problem: Skin Integrity: Goal: Risk for impaired skin integrity will decrease Outcome: Progressing   Problem: Tissue Perfusion: Goal: Adequacy of tissue perfusion will improve Outcome: Progressing   

## 2022-06-06 NOTE — TOC Progression Note (Signed)
Transition of Care [facility]) - Progression Note    Patient Details  Name: VERDENE CRESON MRN: [id number] Date of Birth: 03/16/1956  Transition of [facility]) CM/SW Contact  Mearl Latin, LCSW Phone Number: 06/06/2022, 9:25 AM  Clinical Narrative:    Per Phineas Semen, patient owes them [phone] before she would be able to return (prior to that she used 20 days at [facility]). Per spouse he is unable to pay that amount. Phineas Semen stated since patient went home the day after the Medicaid application was completed, it would have to be reapplied for as patient must be at SNF for 30 consecutive days for the [facility] application to be considered.      Barriers to Discharge: English as a second language teacher (in copay days)  Expected Discharge Plan and Services   In-house Referral: Clinical Social Work   Post Acute Care Choice: Skilled Nursing Facility Living arrangements for the past 2 months: Hotel/Motel                                       Social Determinants of Health (SDOH) Interventions    Readmission Risk Interventions     No data to display

## 2022-06-06 NOTE — Discharge Summary (Addendum)
Name: Veronica Murray MRN: [id number] DOB: 1956-06-05 66 y.o. PCP: [facility]  Date of Admission: 06/03/2022  9:23 AM Date of Discharge: 06/06/2022 11:47 AM Attending Physician: Dr. Antony Contras  Discharge Diagnosis: Principal Problem:   Convulsive syncope Active Problems:   Hypotension   AMS (altered mental status)    Discharge Medications: Allergies as of 06/06/2022       Reactions   Codeine Hives, Shortness Of Breath, Nausea Only   Toradol [ketorolac Tromethamine] Hives, Shortness Of Breath, Nausea Only   Tramadol Hcl Hives, Shortness Of Breath, Nausea Only   "Conflicts with bipolar condition"        Medication List     STOP taking these medications    amoxicillin-clavulanate 875-125 MG tablet Commonly known as: AUGMENTIN   oxyCODONE-acetaminophen 5-325 MG tablet Commonly known as: PERCOCET/ROXICET       TAKE these medications    Acetaminophen Extra Strength 500 MG tablet Generic drug: acetaminophen Take 1,000 mg by mouth 2 (two) times daily.   aspirin EC 81 MG tablet Take 1 tablet (81 mg total) by mouth daily. Swallow whole.   clopidogrel 75 MG tablet Commonly known as: PLAVIX Take 1 tablet (75 mg total) by mouth daily.   DULoxetine 60 MG capsule Commonly known as: CYMBALTA Take 1 capsule (60 mg total) by mouth at bedtime. What changed:  medication strength how much to take   gabapentin 300 MG capsule Commonly known as: NEURONTIN Take 300 mg by mouth 3 (three) times daily.   hydrOXYzine 10 MG tablet Commonly known as: ATARAX Take 1 tablet (10 mg total) by mouth 3 (three) times daily as needed for anxiety or itching.   Insulin Pen Needle 30G X 8 MM Misc Commonly known as: NOVOFINE Inject 10 each into the skin as needed.   Lantus SoloStar 100 UNIT/ML Solostar Pen Generic drug: insulin glargine Inject 5 Units into the skin daily.   multivitamin with minerals Tabs tablet Take 1 tablet by mouth daily.   ondansetron 4 MG  tablet Commonly known as: ZOFRAN Take 1 tablet (4 mg total) by mouth every 6 (six) hours as needed for nausea. What changed:  when to take this reasons to take this   oxyCODONE 5 MG immediate release tablet Commonly known as: Oxy IR/ROXICODONE Take 1 tablet (5 mg total) by mouth every 6 (six) hours as needed for up to 5 days for breakthrough pain (uncontrolled with tylenol).   pantoprazole sodium 40 mg Commonly known as: PROTONIX Take 40 mg by mouth daily.   rosuvastatin 10 MG tablet Commonly known as: Crestor Take 1 tablet (10 mg total) by mouth daily.               Durable Medical Equipment  (From admission, onward)           Start     Ordered   Unscheduled  DME 3-in-1  Once        06/06/22 1147            Disposition and follow-up:   VeronicaHarlyn SHANISHA LECH was discharged from [facility] in Stable condition.  At the hospital follow up visit please address:  1.  Follow-up:  - Recommend pain medication management - Recommend adrenal insufficiency if persistently hypotensive without signs or symptoms of infection - Recommend orthopedics follow up for left hip pain in the setting of gluteus medius tear vs tendinitis and S3 sacral fracture    2.  Labs / imaging needed at time of  follow-up: cbc, bmp, and blood pressure follow up  3.  Pending labs/ test needing follow-up: none  4.  Medication Changes   MODIFIED  - increased Duloxetine to 60 mg daily   Follow-up Appointments:  Follow-up Information     Veronica Cote, MD. Schedule an appointment as soon as possible for a visit in 1 week(s).   Specialty: Orthopedic Surgery Contact information: [address] [place] [address] [phone]         [facility]. Call in 1 week(s).   Why: Please call your primary care provider to make a follow up appointment after your hospital discharge for follow up Contact information: [address] [place] [address] [phone]         [facility] Follow up.   Why: Hospital follow up Contact information: [address] [place] [address] [phone]         [facility] Follow up.   Specialty: Rehabilitation Why: Out patient physical and occupational therapy arranged. Call to schedule apt Contact information: [address] [id number] [place] [address] [phone]                Hospital Course by problem list: Veronica Murray is a 66 y.o. female with PMH of  DM Type 2, HTN, fibromyalgia, with recent stroke on 02/2022 admitted for altered mental status following two episodes of syncope with whole body shaking in setting of nausea/diarrhea prodrome and persistent L hip pain, non wt. bearing.   Suspected Convulsive Syncope Altered Mental Status  Hypotension  Pt presenting with transient episodes of altered mental status associated with loss of consciousness and whole body shaking per husband. History limited by patient's confusion and husband poor historian. UDS negative. CT head and MRI brain showing stable watershed infarcts in the bilateral frontal lobes and left parietal lobe. Pt's BP transiently drops to 90/60s and in the setting of symptoms with positional changes and prodrome of nausea concern for convulsive syncope. Pt was not orthostatic from lying to sitting. Pt previously on gabapentin 300mg  unsure of medication compliance less likely gabapentin induced myoclonus. Pt presented with dehydration likely due to diarrhea. Hydrated with IV fluids. Low concern for seizures, none witnessed inpatient and had a negative EEG overnight. Will not start any AED's at this time. Completed 3 days of IV ceftriaxone given concern for UTI, no growth on cultures. Recommend outpatient work up for adrenal insufficiency if persistent hypotension.   Left Hip Pain Pt reporting left hip  pain for 1 month and reports a fall. Pt without tenderness to palpation, pain presents with movement of the extremity. Pt unable to bear weight or tolerate a seated position. Pt with XR L hip on 05/03/22 that did not show a fracture. Pt seen on 07/03 for left hip pain with CT hip that showed a subacute fracture of the sacrum at the S3 level and moderate osteoarthritis. Repeat imaging unchanged from prior. Given S3 fracture and persistent pain and non weight bearing consulted ortho who feel pain is likely nerve root irritation vs tendinitis of the gluteus medius. Recommended NSAIDS and Gabapentin 300 TID. Increased Cymbalta to 60 mg to therapeutic dose. Treated pain with Tylenol and Oxycodone 5mg . Concern to start high dose steroids due to risk of worsening confusion as well as her uncontrolled diabetes. Ortho to see outpatient and offered steroid injection. Pt to follow up with outpatient rehab unable to do [facility] PT due to pt being  in between homes.   Deconditioning  Pt has had increasing difficulty with IADLS following stroke per husband. Husband is disabled and does not feel he is able to take care of patient. Pt is not able to drive. Husband agreeable to SNF if deemed appropriate. Husband feels she needs more rehabilitation. Husband is saying pt is in between homes in which she does not have a permanent residence. He states the children no longer engage with the patient and would not be amendable to providing housing. Pt unable to get SNF placement at this time due insurance, out of pocket cost would be $200 a day.  UTI Ruled Out Pt endorsing dysuria. UA minimally infectious appearing with rare bacteria and budding yeast present. Given altered mental status treated with IV ceftriaxone and Diflucan 200mg  for 3 days. Pt mentation improved. No growth on cultures, UTI ruled out.   Stroke Pt with recent history of stroke in 02/2022 watershed infarcts in the bilateral frontal lobes and left parietal lobe. Stable  on CT. MRI brain with no acute process. Stable chronic infarcts. Continue Crestor and ASA.   Diabetes Mellitus Type 2 Pt with diagnosis of DM T2. Pt glucose monitored in patient and managed on SSI and Semglee 7 units.   HTN Pt blood pressure will dip into the 90/60s at times. Concerning given her episodes of loss of consciousness during position changes. Pt had negative orthostatics.   Discharge Subjective: States she feels better and thinks she could go home today. Patient states ortho came by and mentioned possibly starting steroids. Reports she is staying with a friend right now. Says she doesn't want to go to rehab. States she could walk before admission but hasn't while she's been here. Says her husband can help her at home. Says they live together at a friend's house. Says she doesn't know husband's phone number (but it's on her phone.  Discharge Exam:   Blood pressure 101/73, pulse 84, temperature 98 F (36.7 C), temperature source Oral, resp. rate 15, height 5\' 2"  (1.575 m), weight 68.6 kg, SpO2 97 %.  Constitutional:Ill-appearing woman sitting in bed, in no acute distress HENT: normocephalic atraumatic, mucous membranes moist Cardiovascular: regular rate and rhythm, no m/r/g Pulmonary/Chest: normal work of breathing on room air, lungs clear to auscultation bilaterally Abdominal: soft, non-tender, non-distended. No suprapubic tenderness Neurological: alert & oriented to self and to place. MSK: no gross abnormalities.  Skin: warm and dry Psych: labile mood and affect  Pertinent Labs, Studies, and Procedures:     Latest Ref Rng & Units 06/06/2022    3:40 AM 06/05/2022    2:45 AM 06/04/2022    4:32 AM  CBC  WBC 4.0 - 10.5 K/uL 6.7  7.0  7.4   Hemoglobin 12.0 - 15.0 g/dL 06/07/2022  06/06/2022  16.1   Hematocrit 36.0 - 46.0 % 31.0  30.1  31.7   Platelets 150 - 400 K/uL 280  282  286        Latest Ref Rng & Units 06/06/2022    3:40 AM 06/05/2022    2:45 AM 06/04/2022    4:32 AM  CMP   Glucose 70 - 99 mg/dL 06/07/2022  06/06/2022  409   BUN 8 - 23 mg/dL 9  15  28    Creatinine 0.44 - 1.00 mg/dL 811  914    Sodium 135 - 145 mmol/L 133  131  131   Potassium 3.5 - 5.1 mmol/L 3.9  3.6  3.7   Chloride 98 -  111 mmol/L 99  95  94   CO2 22 - 32 mmol/L 25  26  28    Calcium 8.9 - 10.3 mg/dL 9.2  9.0  9.3     CT HIP LEFT WO CONTRAST  Result Date: 06/04/2022 CLINICAL DATA:  Hip trauma, fracture suspected, no prior imaging EXAM: CT OF THE LEFT HIP WITHOUT CONTRAST TECHNIQUE: Multidetector CT imaging of the left hip was performed according to the standard protocol. Multiplanar CT image reconstructions were also generated. RADIATION DOSE REDUCTION: This exam was performed according to the departmental dose-optimization program which includes automated exposure control, adjustment of the mA and/or kV according to patient size and/or use of iterative reconstruction technique. COMPARISON:  X-ray 05/05/2022, CT 05/13/2022 FINDINGS: Bones/Joint/Cartilage Again seen is a subacute sacral fracture at the S3 level with patchy sclerosis (series 8, image 17), similar in appearance to the previous study. Osseous structures appear otherwise intact. No additional fractures. Hip joint alignment is maintained without dislocation. No evidence of femoral head avascular necrosis. Moderate osteoarthritis of the left hip. No appreciable hip joint effusion. Pubic symphysis and left SI joint intact without diastasis. Ligaments Suboptimally assessed by CT. Muscles and Tendons No acute musculotendinous abnormality by CT. Soft tissues No fluid collection or hematoma about the left hip. No left inguinal lymphadenopathy. Atherosclerotic vascular calcifications. IMPRESSION: 1. No acute osseous abnormality of the left hip. 2. Subacute sacral fracture at the S3 level, similar in appearance to the previous study. 3. Moderate osteoarthritis of the left hip. Electronically Signed   By: 07/14/2022 D.O.   On: 06/04/2022 16:11   MR  BRAIN WO CONTRAST  Result Date: 06/04/2022 CLINICAL DATA:  Mental status change, unknown cause. EXAM: MRI HEAD WITHOUT CONTRAST TECHNIQUE: Multiplanar, multiecho pulse sequences of the brain and surrounding structures were obtained without intravenous contrast. COMPARISON:  Head CT 06/03/2022 and MRI 03/02/2022 FINDINGS: The coronal T2 sequence is moderately motion degraded, and there is intermittent mild motion on other sequences. Brain: There is no evidence of an acute infarct, mass, midline shift, or extra-axial fluid collection. There is a moderate-sized chronic right MCA infarct involving the frontal lobe and insula with associated chronic blood products. There is also hemosiderin stained encephalomalacia involving the left frontal, left parietal, and left occipital lobes corresponding to MCA and border zone infarcts. Background mild chronic small vessel ischemia is again noted in the cerebral white matter. Mild global cerebral atrophy is within normal limits for age. Vascular: Major intracranial vascular flow voids are preserved. Skull and upper cervical spine: Unremarkable bone marrow signal. Sinuses/Orbits: Unremarkable orbits. Paranasal sinuses and mastoid air cells are clear. Other: None. IMPRESSION: 1. No acute intracranial abnormality. 2. Chronic bilateral cerebral infarcts. Electronically Signed   By: 03/04/2022 M.D.   On: 06/04/2022 15:48   EEG adult  Result Date: 06/03/2022 06/05/2022, MD     06/03/2022 10:32 PM Patient Name: ARGENTINA KOSCH MRN: Alveta Heimlich Epilepsy Attending: [id number] Referring Physician/Provider: Charlsie Quest, NP Date: 06/03/2022 Duration: 21.05 mins Patient history: 66yo F with ams. EEG to evaluate for seizure Level of alertness: Awake AEDs during EEG study: None Technical aspects: This EEG study was done with scalp electrodes positioned according to the 10-20 International system of electrode placement. Electrical activity was acquired at a sampling rate  of 500Hz  and reviewed with a high frequency filter of 70Hz  and a low frequency filter of 1Hz . EEG data were recorded continuously and digitally stored. Description: The posterior dominant rhythm consists of 8 Hz  activity of moderate voltage (25-35 uV) seen predominantly in posterior head regions, symmetric and reactive to eye opening and eye closing. EEG showed intermittent generalized 3 to 6 Hz theta-delta slowing. Hyperventilation and photic stimulation were not performed.   ABNORMALITY - Intermittent slow, generalized IMPRESSION: This study is suggestive of mild diffuse encephalopathy, nonspecific etiology. No seizures or epileptiform discharges were seen throughout the recording. Charlsie Quest   DG Chest 2 View  Result Date: 06/03/2022 CLINICAL DATA:  Reason for Exam: ams Triage Note: Pt BIB [facility] from [facility]. Called out for patient not responding to verbal, just looking around. Released from rehab on Friday, pt was at rehab for a stroke. No meds since Friday, EXAM: CHEST - 2 VIEW COMPARISON:  None Available. FINDINGS: The heart size and mediastinal contours are within normal limits. Both lungs are clear. The visualized skeletal structures are unremarkable. IMPRESSION: No acute cardiopulmonary process. Electronically Signed   By: Genevive Bi M.D.   On: 06/03/2022 18:01   CT Head Wo Contrast  Result Date: 06/03/2022 CLINICAL DATA:  Altered mental status, recent rehabilitation for a stroke. EXAM: CT HEAD WITHOUT CONTRAST TECHNIQUE: Contiguous axial images were obtained from the base of the skull through the vertex without intravenous contrast. RADIATION DOSE REDUCTION: This exam was performed according to the departmental dose-optimization program which includes automated exposure control, adjustment of the mA and/or kV according to patient size and/or use of iterative reconstruction technique. COMPARISON:  05/13/2022 FINDINGS: Brain: Despite efforts by the technologist and patient,  motion artifact is present on today's exam and could not be eliminated. This reduces exam sensitivity and specificity. Encephalomalacia involving the bilateral watershed regions including both frontal lobes and the left parietal lobe noted, not substantially changed from 05/13/2022 based on multi planar assessment. Periventricular white matter and corona radiata hypodensities favor chronic ischemic microvascular white matter disease. No new stroke, intracranial hemorrhage, or mass lesion is identified. No hydrocephalus or other significant new abnormality intracranially. Vascular: There is atherosclerotic calcification of the cavernous carotid arteries bilaterally. Skull: Unremarkable Sinuses/Orbits: Unremarkable Other: No supplemental non-categorized findings. IMPRESSION: 1. Stable CT appearance of watershed infarcts in the bilateral frontal lobes and left parietal lobe. 2. Atherosclerosis. 3. Periventricular white matter and corona radiata hypodensities favor chronic ischemic microvascular white matter disease. Electronically Signed   By: Gaylyn Rong M.D.   On: 06/03/2022 10:50   DG Chest 1 View  Result Date: 06/03/2022 CLINICAL DATA:  Altered mental status EXAM: CHEST  1 VIEW COMPARISON:  05/13/2022 FINDINGS: Cardiac size is within normal limits. Pulmonary vascularity is unremarkable. There is faint haziness in the right parahilar region. There is no definite focal pulmonary consolidation. There is no pleural effusion or pneumothorax. Surgical clips are seen in left side of neck. IMPRESSION: There is faint haziness in right parahilar region which may be an artifact due to confluence of chest wall attenuation over the lung field suggests early pneumonia. Follow-up PA and lateral views of chest may be considered. Electronically Signed   By: Ernie Avena M.D.   On: 06/03/2022 10:33     Discharge Instructions: Discharge Instructions     Ambulatory referral to Occupational Therapy   Complete  by: As directed    Ambulatory referral to Physical Therapy   Complete by: As directed    Diet - low sodium heart healthy   Complete by: As directed    Discharge instructions   Complete by: As directed    Veronica Murray, Veronica Murray were seen in the hospital  following two episodes of blacking out followed by some confusion. This was likely convulsive syncope related to dehydration as well as your low blood pressure. Please continue to stay hydrated and drink lots of water so that your pee remains light yellow in color.   You were also found to have a UTI and were treated for 3 days with antibiotics.  Your hip pain is related to an S3 sacral fracture likely from your fall a month ago. It is very important that you follow up with Orthopedics and rehabilitation to build strength in this leg and regain more mobility. You may take ibuprofen 600mg  every 6 hours for pain control. Your Cymbalta and Gabapentin will also help with your pain. Please take as prescribed. We have also prescribed you a short course of pain medication called Oxycodone to take for severe pain. This medication is an opiate and is at high risk for addiction.  Please call to make an appointment with your primary care doctor in the next week to get evaluated after your hospital stay.   Also call to make an appointment with Dr. Steward DroneBokshan, the bone doctor, to follow up with your hip pain.  Take care,  Your [facility] team   Increase activity slowly   Complete by: As directed        Signed: Morene CrockerMaria Gomez Caraballo, MD [facility] - PGY1 Pager: [phone] 06/06/2022, 11:47 AM

## 2022-06-06 NOTE — Progress Notes (Signed)
Patient left without being seen due to a health emergency and EMS had to be called to take her to ER for evaluation

## 2022-06-06 NOTE — TOC Transition Note (Signed)
Transition of Care [facility]) - CM/SW Discharge Note   Patient Details  Name: Veronica Murray MRN: [id number] Date of Birth: 1956/10/28  [facility]) CM/SW Contact:  Harriet Masson, RN Phone Number: 06/06/2022, 11:32 AM   Clinical Narrative:     Patient stable for discharge. Patient's husband is agreeable to OP rehab and [facility]. Referral sent for PT and OT. Husband states he plans to find different housing tomorrow. Husband can transport home. Order for 3&1. Husband agreeable to use in house provider [facility]. Spoke to [place] and 3&1 will be delivered to room prior to dc.  Final next level of care: OP Rehab Barriers to Discharge: Barriers Resolved   Patient Goals and CMS Choice Patient states their goals for this hospitalization and ongoing recovery are:: go home CMS Medicare.gov Compare Post Acute Care list provided to:: Patient Represenative (must comment) Choice offered to / list presented to : Spouse  Discharge Placement                 home      Discharge Plan and Services In-house Referral: Clinical Social Work   Post Acute Care Choice: Skilled Nursing Facility          DME Arranged: 3-N-1 DME Agency: [employer] Date DME Agency Contacted: 06/06/22 Time DME Agency Contacted: [phone] Representative spoke with at DME Agency: Suzanna Obey            Social Determinants of Health (SDOH) Interventions     Readmission Risk Interventions    06/06/2022   11:31 AM  Readmission Risk Prevention Plan  Transportation Screening Complete  PCP or Specialist Appt within 3-5 Days Complete  HRI or Home Care Consult Complete  Social Work Consult for Recovery Care Planning/Counseling Complete  Palliative Care Screening Not Applicable  Medication Review [employer]) Complete

## 2022-07-10 ENCOUNTER — Ambulatory Visit (HOSPITAL_COMMUNITY): Payer: Medicare HMO | Attending: Internal Medicine | Admitting: Physical Therapy

## 2022-12-20 ENCOUNTER — Encounter: Payer: Self-pay | Admitting: Family Medicine

## 2022-12-20 ENCOUNTER — Ambulatory Visit (INDEPENDENT_AMBULATORY_CARE_PROVIDER_SITE_OTHER): Payer: Medicare HMO | Admitting: Family Medicine

## 2022-12-20 VITALS — BP 122/80 | HR 86 | Ht 62.0 in | Wt 150.0 lb

## 2022-12-20 DIAGNOSIS — R0602 Shortness of breath: Secondary | ICD-10-CM

## 2022-12-20 DIAGNOSIS — E039 Hypothyroidism, unspecified: Secondary | ICD-10-CM

## 2022-12-20 DIAGNOSIS — E119 Type 2 diabetes mellitus without complications: Secondary | ICD-10-CM

## 2022-12-20 DIAGNOSIS — I10 Essential (primary) hypertension: Secondary | ICD-10-CM

## 2022-12-20 DIAGNOSIS — E785 Hyperlipidemia, unspecified: Secondary | ICD-10-CM

## 2022-12-20 DIAGNOSIS — Z122 Encounter for screening for malignant neoplasm of respiratory organs: Secondary | ICD-10-CM

## 2022-12-20 DIAGNOSIS — M545 Low back pain, unspecified: Secondary | ICD-10-CM

## 2022-12-20 DIAGNOSIS — R7301 Impaired fasting glucose: Secondary | ICD-10-CM

## 2022-12-20 DIAGNOSIS — G8929 Other chronic pain: Secondary | ICD-10-CM

## 2022-12-20 DIAGNOSIS — Z1211 Encounter for screening for malignant neoplasm of colon: Secondary | ICD-10-CM

## 2022-12-20 DIAGNOSIS — Z1231 Encounter for screening mammogram for malignant neoplasm of breast: Secondary | ICD-10-CM

## 2022-12-20 DIAGNOSIS — Z1159 Encounter for screening for other viral diseases: Secondary | ICD-10-CM

## 2022-12-20 DIAGNOSIS — F331 Major depressive disorder, recurrent, moderate: Secondary | ICD-10-CM

## 2022-12-20 DIAGNOSIS — F321 Major depressive disorder, single episode, moderate: Secondary | ICD-10-CM

## 2022-12-20 MED ORDER — CYCLOBENZAPRINE HCL 10 MG PO TABS: 10.0000 mg | ORAL_TABLET | Freq: Three times a day (TID) | ORAL | 1 refills | Status: DC | PRN

## 2022-12-20 MED ORDER — ALBUTEROL SULFATE HFA 108 (90 BASE) MCG/ACT IN AERS: 2.0000 | INHALATION_SPRAY | Freq: Four times a day (QID) | RESPIRATORY_TRACT | 2 refills | Status: AC | PRN

## 2022-12-20 MED ORDER — BLOOD PRESSURE KIT DEVI: 1.0000 | Freq: Once | 1 refills | Status: AC

## 2022-12-20 NOTE — Assessment & Plan Note (Signed)
R/L Straight leg raise performed no pain nor discomfort noted on patient R/L Cross leg raise performed no pain nor discomfort noted on patient Prescribed flexeril 10 mg Referral to pain management

## 2022-12-20 NOTE — Assessment & Plan Note (Signed)
[place] Office Visit from 12/20/2022 in [facility]  PHQ-9 Total Score 21      Patient does not want to initiate medication for depression, but would like to therapy and speak to a specialist  Referral to Behavioral health placed.

## 2022-12-20 NOTE — Assessment & Plan Note (Addendum)
Patient Blood pressure 142/98, labs ordered  Blood pressure not well controlled. Discussed non pharmacological interventions such as low salt, cardiac diet discussed. Explained to follow DASH diet which includes vegetables,fruits,whole grains, fat free or low fat diary,fish,poultry,beans,nuts and seeds,vegetable oils. Find an activity that you will enjoy and start to be active at least 5 days a week for 30 minutes each day.  Educated on stress reduction and physical activity. Discussed signs and symptoms of major cardiovascular event and need to present to the ED.  Follow up in 2 weeks with blood pressure reading log. Patient verbalizes understanding regarding plan of care and all questions answered.

## 2022-12-20 NOTE — Patient Instructions (Signed)
It was pleasure meeting with you today Please take your medications as prescribed Please return in 2 weeks with blood pressure reading log.

## 2022-12-20 NOTE — Progress Notes (Signed)
New Patient Office Visit   Subjective   Patient ID: Veronica Murray, female    DOB: 01-20-1956  Age: 67 y.o. MRN: [id number]  CC:  Chief Complaint  Patient presents with   Establish Care   Back Pain    Patient complains of back pain starting a year ago.    Diabetes    HPI Veronica Murray presents to establish care. She  has a past medical history of Aortic arch atherosclerosis ([place]), Diabetes mellitus without complication ([place]), Encephalopathy, Essential hypertension, Fibromyalgia, Stroke ([place]), and Tobacco abuse.  Patient here for elevated blood pressure. She is not exercising and is not adherent to low salt diet. Blood pressure is not well controlled at home. Cardiac symptoms dyspnea and fatigue. Patient denies chest pain, claudication, lower extremity edema, palpitations, and tachypnea.  Cardiovascular risk factors: diabetes mellitus, hypertension, sedentary lifestyle, and smoking/ tobacco exposure. Patient denies taking blood pressure medications.  Patient presents for evaluation of lower back problems.  Symptoms have been present for 1 year. She reports aching, sharp, shooting, stabbing, and throbbing pain in character; 10/10 in severity. Symptoms are worst all day long. Exacerbating factors identifiable by patient are bending backwards, bending forwards, bending sideways, sitting, and walking. Treatments so far initiated by patient tylenol applying heat and ice with mild relief.    Depression      The patient presents with depression.  This is a chronic problem.  The current episode started more than 1 year ago.   The onset quality is gradual.   The problem occurs constantly.  The problem has been gradually worsening since onset.  Associated symptoms include decreased concentration, fatigue, helplessness, hopelessness, irritable, restlessness, decreased interest and appetite change.     The symptoms are aggravated by family issues.  Past treatments include nothing.  Risk factors  include stress, prior traumatic experience and family history.   Past medical history includes depression.   Patient does not want medications related to her depression but would like to have communicative therapy instead.   Outpatient Encounter Medications as of 12/20/2022  Medication Sig   albuterol (VENTOLIN HFA) 108 (90 Base) MCG/ACT inhaler Inhale 2 puffs into the lungs every 6 (six) hours as needed for wheezing or shortness of breath.   Blood Pressure Monitoring (BLOOD PRESSURE KIT) DEVI 1 kit by Does not apply route once for 1 dose.   cyclobenzaprine (FLEXERIL) 10 MG tablet Take 1 tablet (10 mg total) by mouth 3 (three) times daily as needed for muscle spasms.   gabapentin (NEURONTIN) 300 MG capsule Take 300 mg by mouth 3 (three) times daily.   Insulin Aspart FlexPen (NOVOLOG) 100 UNIT/ML Inject into the skin.   insulin glargine (LANTUS SOLOSTAR) 100 UNIT/ML Solostar Pen Inject 5 Units into the skin daily.   Multiple Vitamin (MULTIVITAMIN WITH MINERALS) TABS tablet Take 1 tablet by mouth daily.   ACETAMINOPHEN EXTRA STRENGTH 500 MG tablet Take 1,000 mg by mouth 2 (two) times daily.   aspirin EC 81 MG EC tablet Take 1 tablet (81 mg total) by mouth daily. Swallow whole.   clopidogrel (PLAVIX) 75 MG tablet Take 1 tablet (75 mg total) by mouth daily.   hydrOXYzine (ATARAX) 10 MG tablet Take 1 tablet (10 mg total) by mouth 3 (three) times daily as needed for anxiety or itching.   Insulin Pen Needle (NOVOFINE) 30G X 8 MM MISC Inject 10 each into the skin as needed.   ondansetron (ZOFRAN) 4 MG tablet Take 1 tablet (4 mg total) by  mouth every 6 (six) hours as needed for nausea. (Patient taking differently: Take 4 mg by mouth every 8 (eight) hours as needed for nausea or vomiting.)   pantoprazole sodium (PROTONIX) 40 mg Take 40 mg by mouth daily.   rosuvastatin (CRESTOR) 10 MG tablet Take 1 tablet (10 mg total) by mouth daily.   [DISCONTINUED] DULoxetine (CYMBALTA) 60 MG capsule Take 1 capsule (60  mg total) by mouth at bedtime.   No facility-administered encounter medications on file as of 12/20/2022.    Past Surgical History:  Procedure Laterality Date   CAROTID PTA/STENT INTERVENTION Left 01/24/2022   Procedure: CAROTID PTA/STENT INTERVENTION;  Surgeon: Algernon Huxley, MD;  Location: [place] [facility];  Service: Cardiovascular;  Laterality: Left;   CAROTID PTA/STENT INTERVENTION Left 02/28/2022   Procedure: CAROTID PTA/STENT INTERVENTION;  Surgeon: Algernon Huxley, MD;  Location: [place] [facility];  Service: Cardiovascular;  Laterality: Left;   CERVICAL BIOPSY     CHOLECYSTECTOMY     ENDARTERECTOMY Left 01/25/2022   Procedure: ENDARTERECTOMY CAROTID, possible ligation;  Surgeon: Algernon Huxley, MD;  Location: [facility];  Service: Vascular;  Laterality: Left;   ENDARTERECTOMY Left 02/25/2022   Procedure: ENDARTERECTOMY CAROTID revision;  Surgeon: Evaristo Bury, MD;  Location: [facility];  Service: Vascular;  Laterality: Left;   HERNIA REPAIR     LEFT HEART CATH AND CORONARY ANGIOGRAPHY Left 07/05/2019   Procedure: LEFT HEART CATH AND CORONARY ANGIOGRAPHY;  Surgeon: Wellington Hampshire, MD;  Location: [place] [facility];  Service: Cardiovascular;  Laterality: Left;    Review of Systems  Constitutional:  Positive for appetite change and fatigue.  HENT:  Negative for ear pain and tinnitus.   Respiratory:  Negative for shortness of breath.   Psychiatric/Behavioral:  Positive for decreased concentration.       Objective    BP 122/80   Pulse 86   Ht 5' 2"$  (1.575 m)   Wt 150 lb (68 kg)   SpO2 97%   BMI 27.44 kg/m   Physical Exam Constitutional:      General: She is irritable.  Cardiovascular:     Rate and Rhythm: Regular rhythm.     Pulses: Normal pulses.  Pulmonary:     Effort: Pulmonary effort is normal. No respiratory distress.  Musculoskeletal:        General: No swelling. Normal range of motion.     Right lower leg: No edema.     Left lower leg: No edema.  Skin:     General: Skin is warm and dry.     Capillary Refill: Capillary refill takes less than 2 seconds.  Neurological:     Mental Status: She is alert.     Coordination: Coordination normal.     Gait: Gait normal.  Psychiatric:        Attention and Perception: Attention normal.        Mood and Affect: Mood is depressed.        Speech: Speech normal.        Behavior: Behavior is cooperative.        Thought Content: Thought content normal. Thought content does not include suicidal ideation.       Assessment & Plan:  Primary hypertension -     CMP14+EGFR -     Microalbumin / creatinine urine ratio -     Blood Pressure Kit; 1 kit by Does not apply route once for 1 dose.  Dispense: 1 each; Refill: 1  IFG (impaired  fasting glucose) -     Hemoglobin A1c  Hyperlipidemia, unspecified hyperlipidemia type -     CBC with Differential/Platelet -     Lipid panel  Hypothyroidism, unspecified type -     TSH + free T4  Encounter for diabetes type 2 eye exam ([place]) -     Ambulatory referral to Ophthalmology  Screening mammogram, encounter for -     Digital Screening Mammogram, Left and Right; Future  Screening for colon cancer -     Ambulatory referral to Gastroenterology  Need for hepatitis C screening test -     Hepatitis C antibody  Screening for lung cancer -     Ambulatory Referral for Lung Cancer Scre  Depression, major, single episode, moderate ([place]) -     Amb ref to [place]  SOB (shortness of breath) -     Albuterol Sulfate HFA; Inhale 2 puffs into the lungs every 6 (six) hours as needed for wheezing or shortness of breath.  Dispense: 8 g; Refill: 2  Chronic bilateral low back pain without sciatica Assessment & Plan: R/L Straight leg raise performed no pain nor discomfort noted on patient R/L Cross leg raise performed no pain nor discomfort noted on patient Prescribed flexeril 10 mg Referral to pain management   Orders: -     Ambulatory referral to Pain  Clinic  Moderate episode of recurrent major depressive disorder [facility]) Assessment & Plan: [place] Office Visit from 12/20/2022 in [facility]  PHQ-9 Total Score 21      Patient does not want to initiate medication for depression, but would like to therapy and speak to a specialist  Referral to Behavioral health placed.   Essential hypertension Assessment & Plan: Patient Blood pressure 142/98, labs ordered  Blood pressure not well controlled. Discussed non pharmacological interventions such as low salt, cardiac diet discussed. Explained to follow DASH diet which includes vegetables,fruits,whole grains, fat free or low fat diary,fish,poultry,beans,nuts and seeds,vegetable oils. Find an activity that you will enjoy and start to be active at least 5 days a week for 30 minutes each day.  Educated on stress reduction and physical activity. Discussed signs and symptoms of major cardiovascular event and need to present to the ED.  Follow up in 2 weeks with blood pressure reading log. Patient verbalizes understanding regarding plan of care and all questions answered.    Other orders -     Cyclobenzaprine HCl; Take 1 tablet (10 mg total) by mouth 3 (three) times daily as needed for muscle spasms.  Dispense: 30 tablet; Refill: 1    Return in about 1 month (around 01/18/2023) for PAP SMEAR.   Renard Hamper Ria Comment, FNP

## 2022-12-21 LAB — CMP14+EGFR
ALT: 7 IU/L (ref 0–32)
AST: 10 IU/L (ref 0–40)
Albumin/Globulin Ratio: 1.5 (ref 1.2–2.2)
Albumin: 4.2 g/dL (ref 3.9–4.9)
Alkaline Phosphatase: 155 IU/L — ABNORMAL HIGH (ref 44–121)
BUN/Creatinine Ratio: 18 (ref 12–28)
BUN: 10 mg/dL (ref 8–27)
Bilirubin Total: 0.2 mg/dL (ref 0.0–1.2)
CO2: 20 mmol/L (ref 20–29)
Calcium: 9.3 mg/dL (ref 8.7–10.3)
Chloride: 98 mmol/L (ref 96–106)
Creatinine, Ser: 0.55 mg/dL — ABNORMAL LOW (ref 0.57–1.00)
Globulin, Total: 2.8 g/dL (ref 1.5–4.5)
Glucose: 151 mg/dL — ABNORMAL HIGH (ref 70–99)
Potassium: 4.5 mmol/L (ref 3.5–5.2)
Sodium: 135 mmol/L (ref 134–144)
Total Protein: 7 g/dL (ref 6.0–8.5)
eGFR: 102 mL/min/{1.73_m2} (ref >59)

## 2022-12-21 LAB — CBC WITH DIFFERENTIAL/PLATELET
Basophils Absolute: 0 10*3/uL (ref 0.0–0.2)
Basos: 1 %
EOS (ABSOLUTE): 0.2 10*3/uL (ref 0.0–0.4)
Eos: 3 %
Hematocrit: 38.2 % (ref 34.0–46.6)
Hemoglobin: 13.2 g/dL (ref 11.1–15.9)
Immature Grans (Abs): 0 10*3/uL (ref 0.0–0.1)
Immature Granulocytes: 1 %
Lymphocytes Absolute: 2.5 10*3/uL (ref 0.7–3.1)
Lymphs: 44 %
MCH: 30.1 pg (ref 26.6–33.0)
MCHC: 34.6 g/dL (ref 31.5–35.7)
MCV: 87 fL (ref 79–97)
Monocytes Absolute: 0.4 10*3/uL (ref 0.1–0.9)
Monocytes: 7 %
Neutrophils Absolute: 2.6 10*3/uL (ref 1.4–7.0)
Neutrophils: 44 %
Platelets: 235 10*3/uL (ref 150–450)
RBC: 4.38 x10E6/uL (ref 3.77–5.28)
RDW: 12.6 % (ref 11.7–15.4)
WBC: 5.8 10*3/uL (ref 3.4–10.8)

## 2022-12-21 LAB — TSH+FREE T4
Free T4: 1.21 ng/dL (ref 0.82–1.77)
TSH: 2.08 u[IU]/mL (ref 0.450–4.500)

## 2022-12-21 LAB — HEMOGLOBIN A1C
Est. average glucose Bld gHb Est-mCnc: 186 mg/dL
Hgb A1c MFr Bld: 8.1 % — ABNORMAL HIGH (ref 4.8–5.6)

## 2022-12-21 LAB — LIPID PANEL
Chol/HDL Ratio: 5 ratio — ABNORMAL HIGH (ref 0.0–4.4)
Cholesterol, Total: 218 mg/dL — ABNORMAL HIGH (ref 100–199)
HDL: 44 mg/dL (ref >39)
LDL Chol Calc (NIH): 143 mg/dL — ABNORMAL HIGH (ref 0–99)
Triglycerides: 171 mg/dL — ABNORMAL HIGH (ref 0–149)
VLDL Cholesterol Cal: 31 mg/dL (ref 5–40)

## 2022-12-21 LAB — HEPATITIS C ANTIBODY: Hep C Virus Ab: NONREACTIVE

## 2022-12-24 ENCOUNTER — Encounter: Payer: Self-pay | Admitting: *Deleted

## 2023-01-03 ENCOUNTER — Encounter: Payer: Self-pay | Admitting: Family Medicine

## 2023-01-03 ENCOUNTER — Ambulatory Visit (INDEPENDENT_AMBULATORY_CARE_PROVIDER_SITE_OTHER): Payer: Medicare HMO | Admitting: Family Medicine

## 2023-01-03 VITALS — BP 135/83 | HR 100 | Ht 62.0 in | Wt 151.0 lb

## 2023-01-03 DIAGNOSIS — G8929 Other chronic pain: Secondary | ICD-10-CM

## 2023-01-03 DIAGNOSIS — E119 Type 2 diabetes mellitus without complications: Secondary | ICD-10-CM | POA: Diagnosis not present

## 2023-01-03 DIAGNOSIS — G2581 Restless legs syndrome: Secondary | ICD-10-CM | POA: Diagnosis not present

## 2023-01-03 DIAGNOSIS — Z794 Long term (current) use of insulin: Secondary | ICD-10-CM

## 2023-01-03 DIAGNOSIS — I10 Essential (primary) hypertension: Secondary | ICD-10-CM

## 2023-01-03 DIAGNOSIS — M545 Low back pain, unspecified: Secondary | ICD-10-CM | POA: Diagnosis not present

## 2023-01-03 MED ORDER — METHYLPREDNISOLONE ACETATE 40 MG/ML IJ SUSP
40.0000 mg | Freq: Once | INTRAMUSCULAR | Status: AC
  Administered 2023-01-03: 40 mg via INTRAMUSCULAR

## 2023-01-03 MED ORDER — ROSUVASTATIN CALCIUM 20 MG PO TABS: 20.0000 mg | ORAL_TABLET | Freq: Every day | ORAL | 3 refills | Status: DC

## 2023-01-03 MED ORDER — GABAPENTIN 300 MG PO CAPS: 300.0000 mg | ORAL_CAPSULE | Freq: Three times a day (TID) | ORAL | 3 refills | Status: DC

## 2023-01-03 MED ORDER — HYDROCODONE-ACETAMINOPHEN 5-325 MG PO TABS: 1.0000 | ORAL_TABLET | Freq: Three times a day (TID) | ORAL | 0 refills | Status: DC | PRN

## 2023-01-03 MED ORDER — AMLODIPINE BESYLATE 2.5 MG PO TABS: 2.5000 mg | ORAL_TABLET | Freq: Every day | ORAL | 3 refills | Status: DC

## 2023-01-03 MED ORDER — GLIMEPIRIDE 1 MG PO TABS: 1.0000 mg | ORAL_TABLET | Freq: Every day | ORAL | 3 refills | Status: DC

## 2023-01-03 NOTE — Progress Notes (Signed)
Spoke to patient about lab results Treatment plan includes: Increased Crestor to 20 mg Hemoglobin A 1C 8.1 slightly trending down, Prescribed glimepiride 1 mg, continue Insulin Glargine inject 5 units into skin daily Explained to follow a DASH diet which includes vegetables,fruits,whole grains, fat free or low fat diary,fish,poultry,beans,nuts and seeds,vegetable oils. Find an activity that you will enjoyandstart to be active at least 5 days a week for 30 minutes each day.

## 2023-01-03 NOTE — Assessment & Plan Note (Addendum)
Vitals:   01/03/23 0245 01/03/23 1416  BP: (!) 145/92 135/83    Started patient on amlodipine 2.5 mg as initial therapy Blood pressure not controlled. Discussed medication desired effects, potential side effects, and how to administer the medication. Non Pharmacological interventions such as low salt, cardiac diet discussed. Educated on stress reduction and physical activity. Discussed signs and symptoms of major cardiovascular event and need to present to the ED. Follow up in 1 month or sooner if needed. Patient verbalizes understanding regarding plan of care and all questions answered.

## 2023-01-03 NOTE — Patient Instructions (Signed)
It was pleasure meeting with you today. Please take medications as prescribed. Follow up with your primary health provider if any health concerns arises. If symptoms worsen please contact your primary care provider and/or visit the emergency department.

## 2023-01-03 NOTE — Assessment & Plan Note (Signed)
Patient report Flexirl 10 mg did not help with back pain as per out last visit. Depo-Medrol injection 40 mg given at clinic Prescription for back pain Norco 5-325 mg sent to pharmacy Discussed medication desired effects, potential side effects. Non pharmacological interventions include rest, avoid twisting,improper bending, straining lower back. Demonstration of proper body mechanics. Alternate ice and heat. Recommend stretching back and legs. Follow up for worsening or persistent symptoms. Patient verbalizes understanding regarding plan of care and all questions answered.  Referral to Neurology for further evaluation.

## 2023-01-03 NOTE — Progress Notes (Addendum)
Patient Office Visit   Subjective   Patient ID: Veronica Murray, female    DOB: 02/13/1956  Age: 67 y.o. MRN: [id number]  CC:  Chief Complaint  Patient presents with   restless legs    Patient complains of restless legs at night.    Hypertension    Patient is here for HTN f/u. States she has an automatic cuff at home but does not know how to use it so has not been.     HPI Veronica Murray  67 year old female, hypertension management and back pain. She  has a past medical history of Aortic arch atherosclerosis ([place]), Diabetes mellitus without complication ([place]), Encephalopathy, Essential hypertension, Fibromyalgia, Stroke ([place]), and Tobacco abuse.  Back Pain This is a chronic problem. The current episode started more than 1 year ago. The problem occurs constantly. The problem has been gradually worsening since onset. The pain is present in the lumbar spine. The quality of the pain is described as aching. The pain does not radiate. The pain is at a severity of 10/10. The pain is moderate. The pain is The same all the time. The symptoms are aggravated by lying down, position and stress. Stiffness is present All day. Pertinent negatives include no abdominal pain, bladder incontinence, bowel incontinence, chest pain, fever, headaches, numbness, pelvic pain, tingling or weakness. She has tried muscle relaxant, analgesics and walking for the symptoms. The treatment provided no relief.      Outpatient Encounter Medications as of 01/03/2023  Medication Sig   ACETAMINOPHEN EXTRA STRENGTH 500 MG tablet Take 1,000 mg by mouth 2 (two) times daily.   albuterol (VENTOLIN HFA) 108 (90 Base) MCG/ACT inhaler Inhale 2 puffs into the lungs every 6 (six) hours as needed for wheezing or shortness of breath.   amLODipine (NORVASC) 2.5 MG tablet Take 1 tablet (2.5 mg total) by mouth daily.   aspirin EC 81 MG EC tablet Take 1 tablet (81 mg total) by mouth daily. Swallow whole.   clopidogrel (PLAVIX) 75 MG  tablet Take 1 tablet (75 mg total) by mouth daily.   glimepiride (AMARYL) 1 MG tablet Take 1 tablet (1 mg total) by mouth daily with breakfast.   HYDROcodone-acetaminophen (NORCO) 5-325 MG tablet Take 1 tablet by mouth every 8 (eight) hours as needed for moderate pain or severe pain.   hydrOXYzine (ATARAX) 10 MG tablet Take 1 tablet (10 mg total) by mouth 3 (three) times daily as needed for anxiety or itching.   insulin glargine (LANTUS SOLOSTAR) 100 UNIT/ML Solostar Pen Inject 5 Units into the skin daily.   Multiple Vitamin (MULTIVITAMIN WITH MINERALS) TABS tablet Take 1 tablet by mouth daily.   ondansetron (ZOFRAN) 4 MG tablet Take 1 tablet (4 mg total) by mouth every 6 (six) hours as needed for nausea. (Patient taking differently: Take 4 mg by mouth every 8 (eight) hours as needed for nausea or vomiting.)   pantoprazole sodium (PROTONIX) 40 mg Take 40 mg by mouth daily.   rosuvastatin (CRESTOR) 20 MG tablet Take 1 tablet (20 mg total) by mouth daily.   [DISCONTINUED] cyclobenzaprine (FLEXERIL) 10 MG tablet Take 1 tablet (10 mg total) by mouth 3 (three) times daily as needed for muscle spasms.   [DISCONTINUED] gabapentin (NEURONTIN) 300 MG capsule Take 300 mg by mouth 3 (three) times daily.   [DISCONTINUED] Insulin Aspart FlexPen (NOVOLOG) 100 UNIT/ML Inject into the skin.   [DISCONTINUED] Insulin Pen Needle (NOVOFINE) 30G X 8 MM MISC Inject 10 each into the  skin as needed.   [DISCONTINUED] rosuvastatin (CRESTOR) 10 MG tablet Take 1 tablet (10 mg total) by mouth daily.   gabapentin (NEURONTIN) 300 MG capsule Take 1 capsule (300 mg total) by mouth 3 (three) times daily.   [EXPIRED] methylPREDNISolone acetate (DEPO-MEDROL) injection 40 mg    No facility-administered encounter medications on file as of 01/03/2023.    Past Surgical History:  Procedure Laterality Date   CAROTID PTA/STENT INTERVENTION Left 01/24/2022   Procedure: CAROTID PTA/STENT INTERVENTION;  Surgeon: Algernon Huxley, MD;  Location:  [place] [facility];  Service: Cardiovascular;  Laterality: Left;   CAROTID PTA/STENT INTERVENTION Left 02/28/2022   Procedure: CAROTID PTA/STENT INTERVENTION;  Surgeon: Algernon Huxley, MD;  Location: [place] [facility];  Service: Cardiovascular;  Laterality: Left;   CERVICAL BIOPSY     CHOLECYSTECTOMY     ENDARTERECTOMY Left 01/25/2022   Procedure: ENDARTERECTOMY CAROTID, possible ligation;  Surgeon: Algernon Huxley, MD;  Location: [facility];  Service: Vascular;  Laterality: Left;   ENDARTERECTOMY Left 02/25/2022   Procedure: ENDARTERECTOMY CAROTID revision;  Surgeon: Evaristo Bury, MD;  Location: [facility];  Service: Vascular;  Laterality: Left;   HERNIA REPAIR     LEFT HEART CATH AND CORONARY ANGIOGRAPHY Left 07/05/2019   Procedure: LEFT HEART CATH AND CORONARY ANGIOGRAPHY;  Surgeon: Wellington Hampshire, MD;  Location: [place] [facility];  Service: Cardiovascular;  Laterality: Left;    Review of Systems  Constitutional:  Negative for chills and fever.  Respiratory:  Negative for cough and shortness of breath.   Cardiovascular:  Negative for chest pain, palpitations and leg swelling.  Gastrointestinal:  Negative for abdominal pain and bowel incontinence.  Genitourinary:  Negative for bladder incontinence and pelvic pain.  Musculoskeletal:  Positive for back pain.  Neurological:  Negative for dizziness, tingling, weakness, numbness and headaches.      Objective    BP 135/83   Pulse 100   Ht '5\' 2"'$  (1.575 m)   Wt 151 lb (68.5 kg)   SpO2 95%   BMI 27.62 kg/m   Physical Exam Constitutional:      Appearance: Normal appearance.  Cardiovascular:     Rate and Rhythm: Normal rate.     Pulses: Normal pulses.  Pulmonary:     Effort: Pulmonary effort is normal.     Breath sounds: Normal breath sounds.  Musculoskeletal:     Cervical back: Normal range of motion.     Thoracic back: Spasms present. No swelling, edema, deformity, signs of trauma, lacerations or tenderness. Decreased range of  motion.     Lumbar back: Spasms present. No swelling, edema, deformity, signs of trauma, lacerations, tenderness or bony tenderness. Decreased range of motion. Negative right straight leg raise test and negative left straight leg raise test. No scoliosis.  Skin:    General: Skin is warm and dry.     Capillary Refill: Capillary refill takes less than 2 seconds.  Neurological:     Coordination: Coordination abnormal.     Gait: Gait abnormal.  Psychiatric:        Thought Content: Thought content normal.       Assessment & Plan:  Primary hypertension -     amLODIPine Besylate; Take 1 tablet (2.5 mg total) by mouth daily.  Dispense: 30 tablet; Refill: 3  Restless leg syndrome -     Gabapentin; Take 1 capsule (300 mg total) by mouth 3 (three) times daily.  Dispense: 90 capsule; Refill: 3  Chronic midline low back  pain without sciatica Assessment & Plan: Patient report Flexirl 10 mg did not help with back pain as per out last visit. Depo-Medrol injection 40 mg given at clinic Prescription for back pain Norco 5-325 mg sent to pharmacy Discussed medication desired effects, potential side effects. Non pharmacological interventions include rest, avoid twisting,improper bending, straining lower back. Demonstration of proper body mechanics. Alternate ice and heat. Recommend stretching back and legs. Follow up for worsening or persistent symptoms. Patient verbalizes understanding regarding plan of care and all questions answered.  Referral to Neurology for further evaluation.  Orders: -     methylPREDNISolone Acetate -     Ambulatory referral to Neurology -     HYDROcodone-Acetaminophen; Take 1 tablet by mouth every 8 (eight) hours as needed for moderate pain or severe pain.  Dispense: 15 tablet; Refill: 0  Type 2 diabetes mellitus without complication, with long-term current use of insulin (HCC) -     Glimepiride; Take 1 tablet (1 mg total) by mouth daily with breakfast.  Dispense: 30 tablet;  Refill: 3  Essential hypertension Assessment & Plan: Vitals:   01/03/23 0245 01/03/23 1416  BP: (!) 145/92 135/83    Started patient on amlodipine 2.5 mg as initial therapy Blood pressure not controlled. Discussed medication desired effects, potential side effects, and how to administer the medication. Non Pharmacological interventions such as low salt, cardiac diet discussed. Educated on stress reduction and physical activity. Discussed signs and symptoms of major cardiovascular event and need to present to the ED. Follow up in 1 month or sooner if needed. Patient verbalizes understanding regarding plan of care and all questions answered.    Other orders -     Rosuvastatin Calcium; Take 1 tablet (20 mg total) by mouth daily.  Dispense: 90 tablet; Refill: 3    Return in about 3 months (around 04/03/2023) for diabetes, hypertension.   Renard Hamper Ria Comment, FNP

## 2023-01-03 NOTE — Progress Notes (Signed)
Spoke to patient about lab results Treatment plan includes: Increased Crestor to 20 mg Hemoglobin A 1C 8.1 slightly trending down, Prescribed glimepiride 1 mg, continue Insulin Glargine inject 5 units into skin daily Explained to follow a DASH diet which includes vegetables,fruits,whole grains, fat free or low fat diary,fish,poultry,beans,nuts and seeds,vegetable oils. Find an activity that you will enjoyandstart to be active at least 5 days a week for 30 minutes each day.  Follow up in 3 months

## 2023-01-09 ENCOUNTER — Other Ambulatory Visit: Payer: Self-pay | Admitting: Family Medicine

## 2023-01-09 DIAGNOSIS — M545 Low back pain, unspecified: Secondary | ICD-10-CM

## 2023-01-10 ENCOUNTER — Telehealth: Payer: Self-pay | Admitting: Family Medicine

## 2023-01-10 NOTE — Telephone Encounter (Signed)
Patient called can not take these medications makes her dizzy what does patient need to do Please return patient call at [phone].  amLODipine (NORVASC) 2.5 MG tablet [id number]   rosuvastatin (CRESTOR) 20 MG tablet [id number]

## 2023-01-13 ENCOUNTER — Other Ambulatory Visit: Payer: Self-pay | Admitting: Family Medicine

## 2023-01-13 ENCOUNTER — Telehealth: Payer: Self-pay | Admitting: Family Medicine

## 2023-01-13 MED ORDER — ATORVASTATIN CALCIUM 10 MG PO TABS: 10.0000 mg | ORAL_TABLET | Freq: Every day | ORAL | 3 refills | Status: AC

## 2023-01-13 MED ORDER — LISINOPRIL 5 MG PO TABS: 5.0000 mg | ORAL_TABLET | Freq: Every day | ORAL | 3 refills | Status: AC

## 2023-01-13 NOTE — Telephone Encounter (Signed)
Please inform patient I changed medication to Lisinopril 5 mg for Hypertension management and Lipitor 10 mg for hyperlipidemia management.

## 2023-01-13 NOTE — Telephone Encounter (Signed)
Called patient to ask when her appt with the pain clinic is. LMTCB.

## 2023-01-13 NOTE — Telephone Encounter (Signed)
Pt called back stating that she was not going to pain clinic. She was going to Neurology. States she is needing a spine specialist. In severe pain.

## 2023-01-13 NOTE — Telephone Encounter (Signed)
Pt called stating phar is showing her a pwk that we denied a refill on her pain medication. She is in a great deal of pain. Wants to know if can please refill?    HYDROcodone-acetaminophen (NORCO) 5-325 MG tablet

## 2023-01-14 ENCOUNTER — Other Ambulatory Visit: Payer: Self-pay | Admitting: Family Medicine

## 2023-01-14 DIAGNOSIS — G8929 Other chronic pain: Secondary | ICD-10-CM

## 2023-01-14 NOTE — Progress Notes (Deleted)
Referring Provider: Juanda Chance, [place] Primary Care Physician:  Juanda Chance, FNP Primary Gastroenterologist:  Dr. Rayne Du chief complaint on file.   HPI:   Veronica Murray is a 67 y.o. female presenting today at the request of Del Eli Hose, FNP for colon cancer screening.  Colon cancer screening.  Office visit recommended due to Plavix. ***  She has history of CAD, carotid stenosis with amaurosis fugax s/p left carotid endarterectomy with CorMatrix arterial patch reconstruction in March 2023, CVA, HTN, type 2 diabetes, hyperlipidemia.  Today:    Past Medical History:  Diagnosis Date   Aortic arch atherosclerosis ([place])    Diabetes mellitus without complication ([place])    Encephalopathy    Essential hypertension    Fibromyalgia    Stroke ([place])    Tobacco abuse     Past Surgical History:  Procedure Laterality Date   CAROTID PTA/STENT INTERVENTION Left 01/24/2022   Procedure: CAROTID PTA/STENT INTERVENTION;  Surgeon: Algernon Huxley, MD;  Location: [facility];  Service: Cardiovascular;  Laterality: Left;   CAROTID PTA/STENT INTERVENTION Left 02/28/2022   Procedure: CAROTID PTA/STENT INTERVENTION;  Surgeon: Algernon Huxley, MD;  Location: [facility];  Service: Cardiovascular;  Laterality: Left;   CERVICAL BIOPSY     CHOLECYSTECTOMY     ENDARTERECTOMY Left 01/25/2022   Procedure: ENDARTERECTOMY CAROTID, possible ligation;  Surgeon: Algernon Huxley, MD;  Location: [facility];  Service: Vascular;  Laterality: Left;   ENDARTERECTOMY Left 02/25/2022   Procedure: ENDARTERECTOMY CAROTID revision;  Surgeon: Evaristo Bury, MD;  Location: [facility];  Service: Vascular;  Laterality: Left;   HERNIA REPAIR     LEFT HEART CATH AND CORONARY ANGIOGRAPHY Left 07/05/2019   Procedure: LEFT HEART CATH AND CORONARY ANGIOGRAPHY;  Surgeon: Wellington Hampshire, MD;  Location: [facility];  Service: Cardiovascular;  Laterality: Left;    Current  Outpatient Medications  Medication Sig Dispense Refill   ACETAMINOPHEN EXTRA STRENGTH 500 MG tablet Take 1,000 mg by mouth 2 (two) times daily.     albuterol (VENTOLIN HFA) 108 (90 Base) MCG/ACT inhaler Inhale 2 puffs into the lungs every 6 (six) hours as needed for wheezing or shortness of breath. 8 g 2   aspirin EC 81 MG EC tablet Take 1 tablet (81 mg total) by mouth daily. Swallow whole. 30 tablet 11   atorvastatin (LIPITOR) 10 MG tablet Take 1 tablet (10 mg total) by mouth daily. 90 tablet 3   clopidogrel (PLAVIX) 75 MG tablet Take 1 tablet (75 mg total) by mouth daily.     gabapentin (NEURONTIN) 300 MG capsule Take 1 capsule (300 mg total) by mouth 3 (three) times daily. 90 capsule 3   glimepiride (AMARYL) 1 MG tablet Take 1 tablet (1 mg total) by mouth daily with breakfast. 30 tablet 3   HYDROcodone-acetaminophen (NORCO) 5-325 MG tablet Take 1 tablet by mouth every 8 (eight) hours as needed for moderate pain or severe pain. 15 tablet 0   hydrOXYzine (ATARAX) 10 MG tablet Take 1 tablet (10 mg total) by mouth 3 (three) times daily as needed for anxiety or itching. 30 tablet 0   insulin glargine (LANTUS SOLOSTAR) 100 UNIT/ML Solostar Pen Inject 5 Units into the skin daily. 3 mL 0   lisinopril (ZESTRIL) 5 MG tablet Take 1 tablet (5 mg total) by mouth daily. 90 tablet 3   Multiple Vitamin (MULTIVITAMIN WITH MINERALS) TABS tablet Take 1 tablet by mouth daily.  ondansetron (ZOFRAN) 4 MG tablet Take 1 tablet (4 mg total) by mouth every 6 (six) hours as needed for nausea. (Patient taking differently: Take 4 mg by mouth every 8 (eight) hours as needed for nausea or vomiting.) 20 tablet 0   pantoprazole sodium (PROTONIX) 40 mg Take 40 mg by mouth daily.     No current facility-administered medications for this visit.    Allergies as of 01/16/2023 - Review Complete 01/03/2023  Allergen Reaction Noted   Codeine Hives, Shortness Of Breath, and Nausea Only 06/25/2019   Toradol [ketorolac  tromethamine] Hives, Shortness Of Breath, and Nausea Only 06/29/2019   Tramadol hcl Hives, Shortness Of Breath, and Nausea Only 06/29/2019    Family History  Problem Relation Age of Onset   CAD Neg Hx     Social History   Socioeconomic History   Marital status: Married    Spouse name: Not on file   Number of children: Not on file   Years of education: Not on file   Highest education level: Not on file  Occupational History   Not on file  Tobacco Use   Smoking status: Former    Packs/day: 1.50    Years: 50.00    Total pack years: 75.00    Types: Cigarettes   Smokeless tobacco: Never  Vaping Use   Vaping Use: Never used  Substance and Sexual Activity   Alcohol use: Not Currently   Drug use: Not Currently    Frequency: 7.0 times per week    Types: Marijuana   Sexual activity: Not on file  Other Topics Concern   Not on file  Social History Narrative   Not on file   Social Determinants of Health   Financial Resource Strain: Not on file  Food Insecurity: Not on file  Transportation Needs: Not on file  Physical Activity: Not on file  Stress: Not on file  Social Connections: Not on file  Intimate Partner Violence: Not on file    Review of Systems: Gen: Denies any fever, chills, fatigue, weight loss, lack of appetite.  CV: Denies chest pain, heart palpitations, peripheral edema, syncope.  Resp: Denies shortness of breath at rest or with exertion. Denies wheezing or cough.  GI: Denies dysphagia or odynophagia. Denies jaundice, hematemesis, fecal incontinence. GU : Denies urinary burning, urinary frequency, urinary hesitancy MS: Denies joint pain, muscle weakness, cramps, or limitation of movement.  Derm: Denies rash, itching, dry skin Psych: Denies depression, anxiety, memory loss, and confusion Heme: Denies bruising, bleeding, and enlarged lymph nodes.  Physical Exam: There were no vitals taken for this visit. General:   Alert and oriented. Pleasant and  cooperative. Well-nourished and well-developed.  Head:  Normocephalic and atraumatic. Eyes:  Without icterus, sclera clear and conjunctiva pink.  Ears:  Normal auditory acuity. Lungs:  Clear to auscultation bilaterally. No wheezes, rales, or rhonchi. No distress.  Heart:  S1, S2 present without murmurs appreciated.  Abdomen:  +BS, soft, non-tender and non-distended. No HSM noted. No guarding or rebound. No masses appreciated.  Rectal:  Deferred  Msk:  Symmetrical without gross deformities. Normal posture. Extremities:  Without edema. Neurologic:  Alert and  oriented x4;  grossly normal neurologically. Skin:  Intact without significant lesions or rashes. Psych:  Alert and cooperative. Normal mood and affect.    Assessment:     Plan:  ***   Aliene Altes, PA-C [facility] 01/16/2023

## 2023-01-14 NOTE — Telephone Encounter (Signed)
Spoke with patient and patient's husband.

## 2023-01-15 ENCOUNTER — Other Ambulatory Visit: Payer: Self-pay | Admitting: Family Medicine

## 2023-01-15 DIAGNOSIS — M546 Pain in thoracic spine: Secondary | ICD-10-CM

## 2023-01-16 ENCOUNTER — Ambulatory Visit: Payer: Medicare HMO | Admitting: Gastroenterology

## 2023-01-22 ENCOUNTER — Encounter: Payer: Self-pay | Admitting: Family Medicine

## 2023-01-22 ENCOUNTER — Emergency Department (HOSPITAL_COMMUNITY): Payer: Medicare HMO

## 2023-01-22 ENCOUNTER — Ambulatory Visit: Payer: Medicare HMO | Admitting: Internal Medicine

## 2023-01-22 ENCOUNTER — Other Ambulatory Visit: Payer: Self-pay

## 2023-01-22 ENCOUNTER — Encounter: Payer: Self-pay | Admitting: Internal Medicine

## 2023-01-22 ENCOUNTER — Ambulatory Visit (INDEPENDENT_AMBULATORY_CARE_PROVIDER_SITE_OTHER): Payer: Medicare HMO | Admitting: Family Medicine

## 2023-01-22 ENCOUNTER — Encounter (HOSPITAL_COMMUNITY): Payer: Self-pay

## 2023-01-22 ENCOUNTER — Emergency Department (HOSPITAL_COMMUNITY)
Admission: EM | Admit: 2023-01-22 | Discharge: 2023-01-22 | Disposition: A | Discharge status: Home / Self Care | Payer: Medicare HMO | Attending: Emergency Medicine | Admitting: Emergency Medicine

## 2023-01-22 VITALS — BP 128/79 | HR 93 | Ht 62.0 in | Wt 153.0 lb

## 2023-01-22 DIAGNOSIS — M545 Low back pain, unspecified: Secondary | ICD-10-CM

## 2023-01-22 DIAGNOSIS — Z794 Long term (current) use of insulin: Secondary | ICD-10-CM | POA: Insufficient documentation

## 2023-01-22 DIAGNOSIS — R109 Unspecified abdominal pain: Secondary | ICD-10-CM | POA: Diagnosis not present

## 2023-01-22 DIAGNOSIS — G8929 Other chronic pain: Secondary | ICD-10-CM | POA: Diagnosis not present

## 2023-01-22 DIAGNOSIS — S32018A Other fracture of first lumbar vertebra, initial encounter for closed fracture: Secondary | ICD-10-CM | POA: Insufficient documentation

## 2023-01-22 DIAGNOSIS — X58XXXA Exposure to other specified factors, initial encounter: Secondary | ICD-10-CM | POA: Diagnosis not present

## 2023-01-22 DIAGNOSIS — S32010A Wedge compression fracture of first lumbar vertebra, initial encounter for closed fracture: Secondary | ICD-10-CM

## 2023-01-22 LAB — POCT URINALYSIS DIPSTICK
Bilirubin, UA: NEGATIVE
Blood, UA: NEGATIVE
Glucose, UA: NEGATIVE
Ketones, UA: NEGATIVE
Nitrite, UA: NEGATIVE
Protein, UA: NEGATIVE
Spec Grav, UA: 1.015 (ref 1.010–1.025)
Urobilinogen, UA: 0.2 E.U./dL
pH, UA: 6.5 (ref 5.0–8.0)

## 2023-01-22 MED ORDER — METHYLPREDNISOLONE SODIUM SUCC 125 MG IJ SOLR
40.0000 mg | Freq: Once | INTRAMUSCULAR | Status: AC
  Administered 2023-01-22: 40 mg via INTRAMUSCULAR
  Filled 2023-01-22: qty 2

## 2023-01-22 MED ORDER — HYDROCODONE-ACETAMINOPHEN 5-325 MG PO TABS
1.0000 | ORAL_TABLET | Freq: Once | ORAL | Status: AC
  Administered 2023-01-22: 1 via ORAL
  Filled 2023-01-22: qty 1

## 2023-01-22 MED ORDER — OXYCODONE HCL 5 MG PO TABS: 5.0000 mg | ORAL_TABLET | ORAL | 0 refills | Status: DC | PRN

## 2023-01-22 MED ORDER — HYDROCODONE-ACETAMINOPHEN 5-325 MG PO TABS: 1.0000 | ORAL_TABLET | Freq: Three times a day (TID) | ORAL | 0 refills | Status: AC | PRN

## 2023-01-22 MED ORDER — LIDOCAINE 5 % EX PTCH
1.0000 | MEDICATED_PATCH | CUTANEOUS | Status: DC
  Administered 2023-01-22: 1 via TRANSDERMAL
  Filled 2023-01-22: qty 1

## 2023-01-22 NOTE — ED Triage Notes (Signed)
Pt was at PCP and was sent over for "xray of kidneys".   Pt c/o R lower back pain that extends down leg to toes x 2 weeks. States it hurts all the time and is sharp and shooting. Denies injury. Ambulatory in triage.   Has taken bayer and motrin with no relief.

## 2023-01-22 NOTE — ED Provider Notes (Signed)
[place] [facility] Provider Note   CSN: [id number] Arrival date & time: 01/22/23  1532     History  Chief Complaint  Patient presents with   Abdominal Pain   Leg Pain    Veronica Murray is a 67 y.o. female.  67 year old female with history of chronic back pain who presents emergency department with right back pain that radiates down her right leg.  Patient reports over the past 2 weeks she has had worsened back pain on her right lumbar spine.  Says it is sharp and worsened with movement.  No known injuries.  Occasionally will radiate down her right leg.  Denies any new bowel or bladder incontinence or changes in sensation when she wipes.  No new numbness in her legs otherwise wise.  Says that sometimes due to the pain she feels like her right leg will "give out".  Has been following with her primary doctor for this and has been given for Flexeril, Depo-Medrol injection, Norco but says that the pain has persisted so she came into the emergency department for evaluation.  Says that she was told that she should have an x-ray of her kidneys.  Denies any fevers, dysuria, frequency, urgency, or hematuria.       Home Medications Prior to Admission medications   Medication Sig Start Date End Date Taking? Authorizing Provider  ACETAMINOPHEN EXTRA STRENGTH 500 MG tablet Take 1,000 mg by mouth 2 (two) times daily. 05/30/22  Yes [provider]  albuterol (VENTOLIN HFA) 108 (90 Base) MCG/ACT inhaler Inhale 2 puffs into the lungs every 6 (six) hours as needed for wheezing or shortness of breath. 12/20/22  Yes [place] Ria Comment, Lamar Benes, FNP  atorvastatin (LIPITOR) 10 MG tablet Take 1 tablet (10 mg total) by mouth daily. 01/13/23  Yes [place] Comment, Lamar Benes, FNP  gabapentin (NEURONTIN) 300 MG capsule Take 1 capsule (300 mg total) by mouth 3 (three) times daily. 01/03/23  Yes [place] Comment, Lamar Benes, FNP  ibuprofen (ADVIL) 600 MG tablet Take 600 mg by mouth  every 6 (six) hours as needed for moderate pain.   Yes [provider]  insulin glargine (LANTUS SOLOSTAR) 100 UNIT/ML Solostar Pen Inject 5 Units into the skin daily. 05/08/22  Yes Gaylan Gerold, DO  oxyCODONE (ROXICODONE) 5 MG immediate release tablet Take 1 tablet (5 mg total) by mouth every 4 (four) hours as needed for severe pain. 01/22/23  Yes Fransico Meadow, MD  glimepiride (AMARYL) 1 MG tablet Take 1 tablet (1 mg total) by mouth daily with breakfast. Patient not taking: Reported on 01/22/2023 01/03/23   Del Eli Hose, FNP  HYDROcodone-acetaminophen (NORCO) 5-325 MG tablet Take 1 tablet by mouth every 8 (eight) hours as needed for up to 5 days for moderate pain or severe pain. Patient not taking: Reported on 01/22/2023 01/22/23 01/27/23  Del Eli Hose, FNP  lisinopril (ZESTRIL) 5 MG tablet Take 1 tablet (5 mg total) by mouth daily. Patient not taking: Reported on 01/22/2023 01/13/23   Del Eli Hose, FNP      Allergies    Codeine, Toradol [ketorolac tromethamine], and Tramadol hcl    Review of Systems   Review of Systems  Physical Exam Updated Vital Signs BP 134/69 (BP Location: Right Arm)   Pulse 91   Temp 97.9 F (36.6 C) (Oral)   Resp 15   Ht '5\' 2"'$  (1.575 m)   Wt 52.2 kg   SpO2 99%  BMI 21.03 kg/m  Physical Exam Vitals and nursing note reviewed.  Constitutional:      General: She is not in acute distress.    Appearance: She is well-developed.  HENT:     Head: Normocephalic and atraumatic.     Right Ear: External ear normal.     Left Ear: External ear normal.     Nose: Nose normal.  Eyes:     Extraocular Movements: Extraocular movements intact.     Conjunctiva/sclera: Conjunctivae normal.     Pupils: Pupils are equal, round, and reactive to light.  Musculoskeletal:     Cervical back: Normal range of motion and neck supple.     Right lower leg: No edema.     Left lower leg: No edema.     Comments: No spinal midline TTP in  cervical, thoracic, or lumbar spine. No stepoffs noted.   Motor: Muscle bulk and tone are normal. Strength is 5/5 in hip flexion, knee flexion and extension, ankle dorsiflexion and plantar flexion bilaterally. Full strength of great toe dorsiflexion bilaterally.  Sensory: Intact sensation to light touch in L2 though S1 dermatomes bilaterally.   Skin:    General: Skin is warm and dry.  Neurological:     Mental Status: She is alert and oriented to person, place, and time. Mental status is at baseline.  Psychiatric:        Mood and Affect: Mood normal.     ED Results / Procedures / Treatments   Labs (all labs ordered are listed, but only abnormal results are displayed) Labs Reviewed  URINALYSIS, ROUTINE W REFLEX MICROSCOPIC    EKG None  Radiology CT L-SPINE NO CHARGE  Result Date: 01/22/2023 CLINICAL DATA:  Back pain radiating to right lower extremity EXAM: CT LUMBAR SPINE WITHOUT CONTRAST TECHNIQUE: Multidetector CT imaging of the lumbar spine was performed without intravenous contrast administration. Multiplanar CT image reconstructions were also generated. RADIATION DOSE REDUCTION: This exam was performed according to the departmental dose-optimization program which includes automated exposure control, adjustment of the mA and/or kV according to patient size and/or use of iterative reconstruction technique. COMPARISON:  05/04/2022, 01/22/2023 FINDINGS: Segmentation: 5 lumbar type vertebrae. Alignment: Kyphosis centered at a chronic L1 compression deformity. Otherwise alignment is anatomic. Vertebrae: There is a chronic L1 compression fracture, with progressive loss of height since the 05/04/2022 exam. Greater than 75% loss of height now identified, with stable 6 mm retropulsion again identified. There is a new compression deformity through the superior endplate of the L4 vertebral body since the 05/04/2022 exam, with less than 25% loss of height. Chronic compression deformity of the L5  vertebral body with less than 25% loss of vertebral body height. No other acute or destructive bony abnormalities. Paraspinal and other soft tissues: The paraspinal soft tissues are unremarkable. Atherosclerosis throughout the aorta and its branches. Disc levels: At T12/L1 there is moderate central canal stenosis due to retropulsion from the prior L1 compression fracture. At L3-4 and L4-5 there is circumferential disc bulge and bilateral facet hypertrophy resulting in mild central canal stenosis. At L5-S1 there is central disc protrusion and bilateral facet hypertrophy resulting in mild central canal stenosis. Reconstructed images demonstrate no additional findings. IMPRESSION: 1. New compression deformity within the superior endplate of the L4 vertebral body, with less than 25% loss of height. This is age indeterminate, but has developed in the interim since the 05/04/2022 exam. 2. Chronic L1 compression deformity, with progressive loss of height as above. Stable 6 mm retropulsion with resulting  central canal stenosis. 3. Chronic L5 compression deformity without compressive sequela or retropulsion. 4. Multilevel lumbar spondylosis greatest from L3-4 through L5-S1. Electronically Signed   By: Randa Ngo M.D.   On: 01/22/2023 19:02   CT Renal Stone Study  Result Date: 01/22/2023 CLINICAL DATA:  Abdominal and flank pain.  Kidney stones suspected. EXAM: CT ABDOMEN AND PELVIS WITHOUT CONTRAST TECHNIQUE: Multidetector CT imaging of the abdomen and pelvis was performed following the standard protocol without IV contrast. RADIATION DOSE REDUCTION: This exam was performed according to the departmental dose-optimization program which includes automated exposure control, adjustment of the mA and/or kV according to patient size and/or use of iterative reconstruction technique. COMPARISON:  05/04/2022 FINDINGS: Lower chest: Unremarkable. Hepatobiliary: No suspicious focal abnormality in the liver on this study without  intravenous contrast. Gallbladder is surgically absent. No intrahepatic or extrahepatic biliary dilation. Pancreas: No focal mass lesion. No dilatation of the main duct. No intraparenchymal cyst. No peripancreatic edema. Spleen: No splenomegaly. No focal mass lesion. Adrenals/Urinary Tract: No adrenal nodule or mass. No hydronephrosis. No evidence for kidney stones. No secondary changes in either kidney or ureter. No ureteral or bladder stones. Stomach/Bowel: Stomach is unremarkable. No gastric wall thickening. No evidence of outlet obstruction. Duodenum is normally positioned as is the ligament of Treitz. No small bowel wall thickening. No small bowel dilatation. The terminal ileum is normal. The appendix is not well visualized, but there is no edema or inflammation in the region of the cecum. No gross colonic mass. No colonic wall thickening. Vascular/Lymphatic: There is moderate atherosclerotic calcification of the abdominal aorta without aneurysm. There is no gastrohepatic or hepatoduodenal ligament lymphadenopathy. No retroperitoneal or mesenteric lymphadenopathy. No pelvic sidewall lymphadenopathy. Reproductive: Unremarkable. Other: No intraperitoneal free fluid. Musculoskeletal: Degenerative changes are noted in the hips bilaterally. No worrisome lytic or sclerotic osseous abnormality. Marked compression deformity noted at L1, progressive in the interval. Stable mild superior endplate compression deformity at L5 with new mild superior endplate compression deformity at L4. IMPRESSION: 1. No acute findings in the abdomen or pelvis. Specifically, no findings to explain the patient's history of abdominal pain. 2. Marked compression deformity at L1, progressive in the interval. Stable mild superior endplate compression deformity at L5 with new mild superior endplate compression deformity at L4. These changes better characterized on dedicated lumbar spine CT performed at the same time and reader is directed to the  report for that study that has been dictated separately. 3.  Aortic Atherosclerosis (ICD10-I70.0). Electronically Signed   By: Misty Stanley M.D.   On: 01/22/2023 18:54   DG Lumbar Spine Complete  Result Date: 01/22/2023 CLINICAL DATA:  Back pain radiating to the right lower extremity EXAM: LUMBAR SPINE - COMPLETE 4+ VIEW COMPARISON:  CT AP 05/04/22 FINDINGS: There are 5 lumbar type vertebral bodies. Redemonstrated is a severe compression deformity L1 with likely slight interval increase in height loss compared to 05/05/2019. Unchanged mild height loss at L5. Compared to prior exam there may be new superior endplate compression deformity of the L4 vertebral body level. Aortic atherosclerotic calcifications. Assessment of the sacrum is limited due to overlying bowel gas. Surgical clips in the right upper quadrant. Moderate colonic stool burden. IMPRESSION: Compared to prior exam there may be a new superior endplate compression deformity of the L4 vertebral body level. Electronically Signed   By: Marin Roberts M.D.   On: 01/22/2023 17:00    Procedures Procedures   Medications Ordered in ED Medications  lidocaine (LIDODERM) 5 % 1 patch (  1 patch Transdermal Patch Applied 01/22/23 1821)  HYDROcodone-acetaminophen (NORCO/VICODIN) 5-325 MG per tablet 1 tablet (1 tablet Oral Given 01/22/23 1815)  methylPREDNISolone sodium succinate (SOLU-MEDROL) 125 mg/2 mL injection 40 mg (40 mg Intramuscular Given 01/22/23 1815)    ED Course/ Medical Decision Making/ A&P Clinical Course as of 01/22/23 2009  Wed Jan 22, 2023  1735 X-ray shows possible new L4 endplate fracture. [RP]  1912 New L4 compression deformity noted with chronic but progressive L1 fracture.  [RP]    Clinical Course User Index [RP] Fransico Meadow, MD                            Medical Decision Making Amount and/or Complexity of Data Reviewed Labs: ordered. Radiology: ordered.  Risk Prescription drug management.   CYNARA AGUILLARD is  a 67 y.o. female with comorbidities that complicate the patient evaluation including chronic back pain who presents to the emergency department with right back pain radiating down her leg  Initial Ddx:  Lumbar radiculopathy, musculoskeletal pain, pathologic fracture, spinal cord compression, kidney stone  MDM:  Feel the patient likely has lumbar radiculopathy given her symptoms.  Will start with x-rays and see if she needs additional imaging.  Did consider kidney stone but feel this is less likely given the lack of her urinary symptoms we will send urinalysis at this time to see if there is gross blood.  Plan:  Lumbar spine x-ray Urinalysis Pain medication  ED Summary/Re-evaluation:  Lumbar spine films showed possible endplate deformity at L4 so CT scan was obtained which showed worsening L1 compression deformity as well as L4.  Also obtained CT stone protocol which did not show any evidence of kidney stones.  Patient was offered TLSO brace here in the emergency department but she requested to go home instead.  Will have her follow-up with spine clinic as soon as possible and talk to them about receiving a brace.  Was given short prescription of oxycodone for her pain as well.  This patient presents to the ED for concern of complaints listed in HPI, this involves an extensive number of treatment options, and is a complaint that carries with it a high risk of complications and morbidity. Disposition including potential need for admission considered.   Dispo: DC Home. Return precautions discussed including, but not limited to, those listed in the AVS. Allowed pt time to ask questions which were answered fully prior to dc.  Records reviewed Outpatient Clinic Notes I independently reviewed the following imaging with scope of interpretation limited to determining acute life threatening conditions related to emergency care: CT Abdomen/Pelvis and agree with the radiologist interpretation with the  following exceptions: none I personally reviewed and interpreted cardiac monitoring: normal sinus rhythm  I personally reviewed and interpreted the pt's EKG: see above for interpretation  I have reviewed the patients home medications and made adjustments as needed  Final Clinical Impression(s) / ED Diagnoses Final diagnoses:  Compression fracture of L1 vertebra, initial encounter [facility])    Rx / DC Orders ED Discharge Orders          Ordered    oxyCODONE (ROXICODONE) 5 MG immediate release tablet  Every 4 hours PRN        01/22/23 2004              Fransico Meadow, MD 01/22/23 2009

## 2023-01-22 NOTE — Patient Instructions (Signed)
It was pleasure meeting with you today. Please take medications as prescribed. Follow up with your primary health provider if any health concerns arises. If symptoms worsen please contact your primary care provider and/or visit the emergency department.  

## 2023-01-22 NOTE — ED Notes (Signed)
Pt refused to wait for discharge papers. Pt ambulatory to waiting room with no assistance needed.

## 2023-01-22 NOTE — Progress Notes (Signed)
Patient Office Visit   Subjective   Patient ID: Veronica Murray, female    DOB: 13-Feb-1956  Age: 67 y.o. MRN: [id number]  CC:  Chief Complaint  Patient presents with   Back Pain    Patient complains of R side back and flank pain starting 2 weeks ago.     HPI Veronica Murray 67 year old female, present to the clinic for right side flank pain started 2 weeks ago.She  has a past medical history of Aortic arch atherosclerosis ([place]), Diabetes mellitus without complication ([place]), Encephalopathy, Essential hypertension, Fibromyalgia, Stroke ([place]), and Tobacco abuse.  Flank Pain This is a new problem. The current episode started 1 to 4 weeks ago. The right flank pain occurs constantly.Patient reports has been gradually worsening since onset. The quality of the pain is described as aching, stabbing and shooting. The pain is at a severity of 10/10. The pain is moderate. The pain is worse during the night. The symptoms are aggravated by sitting, standing, position and lying down. Stiffness is present all day. Pertinent negatives include no abdominal pain, bladder incontinence, bowel incontinence, chest pain, dysuria, fever, numbness, paresis, paresthesias, pelvic pain, perianal numbness or weakness. Risk factors include sedentary lifestyle and lack of exercise. She has tried ice, analgesics and bed rest for the symptoms. The treatment provided no relief.      Outpatient Encounter Medications as of 01/22/2023  Medication Sig   ACETAMINOPHEN EXTRA STRENGTH 500 MG tablet Take 1,000 mg by mouth 2 (two) times daily.   albuterol (VENTOLIN HFA) 108 (90 Base) MCG/ACT inhaler Inhale 2 puffs into the lungs every 6 (six) hours as needed for wheezing or shortness of breath.   aspirin EC 81 MG EC tablet Take 1 tablet (81 mg total) by mouth daily. Swallow whole.   atorvastatin (LIPITOR) 10 MG tablet Take 1 tablet (10 mg total) by mouth daily.   clopidogrel (PLAVIX) 75 MG tablet Take 1 tablet (75 mg total) by  mouth daily.   gabapentin (NEURONTIN) 300 MG capsule Take 1 capsule (300 mg total) by mouth 3 (three) times daily.   glimepiride (AMARYL) 1 MG tablet Take 1 tablet (1 mg total) by mouth daily with breakfast.   hydrOXYzine (ATARAX) 10 MG tablet Take 1 tablet (10 mg total) by mouth 3 (three) times daily as needed for anxiety or itching.   insulin glargine (LANTUS SOLOSTAR) 100 UNIT/ML Solostar Pen Inject 5 Units into the skin daily.   lisinopril (ZESTRIL) 5 MG tablet Take 1 tablet (5 mg total) by mouth daily.   Multiple Vitamin (MULTIVITAMIN WITH MINERALS) TABS tablet Take 1 tablet by mouth daily.   ondansetron (ZOFRAN) 4 MG tablet Take 1 tablet (4 mg total) by mouth every 6 (six) hours as needed for nausea. (Patient taking differently: Take 4 mg by mouth every 8 (eight) hours as needed for nausea or vomiting.)   pantoprazole sodium (PROTONIX) 40 mg Take 40 mg by mouth daily.   [DISCONTINUED] HYDROcodone-acetaminophen (NORCO) 5-325 MG tablet Take 1 tablet by mouth every 8 (eight) hours as needed for moderate pain or severe pain.   HYDROcodone-acetaminophen (NORCO) 5-325 MG tablet Take 1 tablet by mouth every 8 (eight) hours as needed for up to 5 days for moderate pain or severe pain.   No facility-administered encounter medications on file as of 01/22/2023.    Past Surgical History:  Procedure Laterality Date   CAROTID PTA/STENT INTERVENTION Left 01/24/2022   Procedure: CAROTID PTA/STENT INTERVENTION;  Surgeon: Algernon Huxley, MD;  Location: [facility];  Service: Cardiovascular;  Laterality: Left;   CAROTID PTA/STENT INTERVENTION Left 02/28/2022   Procedure: CAROTID PTA/STENT INTERVENTION;  Surgeon: Algernon Huxley, MD;  Location: [facility];  Service: Cardiovascular;  Laterality: Left;   CERVICAL BIOPSY     CHOLECYSTECTOMY     ENDARTERECTOMY Left 01/25/2022   Procedure: ENDARTERECTOMY CAROTID, possible ligation;  Surgeon: Algernon Huxley, MD;  Location: [facility];  Service: Vascular;   Laterality: Left;   ENDARTERECTOMY Left 02/25/2022   Procedure: ENDARTERECTOMY CAROTID revision;  Surgeon: Evaristo Bury, MD;  Location: [facility];  Service: Vascular;  Laterality: Left;   HERNIA REPAIR     LEFT HEART CATH AND CORONARY ANGIOGRAPHY Left 07/05/2019   Procedure: LEFT HEART CATH AND CORONARY ANGIOGRAPHY;  Surgeon: Wellington Hampshire, MD;  Location: [facility];  Service: Cardiovascular;  Laterality: Left;    Review of Systems  Constitutional:  Negative for chills and fever.  Respiratory:  Negative for cough and shortness of breath.   Cardiovascular:  Negative for chest pain.  Gastrointestinal:  Negative for abdominal pain, nausea and vomiting.  Genitourinary:  Positive for flank pain.  Musculoskeletal:  Positive for back pain.  Neurological:  Negative for dizziness and headaches.      Objective    BP 128/79   Pulse 93   Ht '5\' 2"'$  (1.575 m)   Wt 153 lb (69.4 kg)   SpO2 96%   BMI 27.98 kg/m   Physical Exam Vitals reviewed.  Cardiovascular:     Rate and Rhythm: Normal rate and regular rhythm.     Pulses: Normal pulses.     Heart sounds: Normal heart sounds.  Pulmonary:     Effort: Pulmonary effort is normal.     Breath sounds: Normal breath sounds.  Abdominal:     Palpations: There is no mass.     Tenderness: There is no abdominal tenderness. There is right CVA tenderness. There is no left CVA tenderness or guarding.  Musculoskeletal:     Cervical back: Normal range of motion and neck supple.     Right lower leg: No edema.     Left lower leg: No edema.  Skin:    General: Skin is warm and dry.     Capillary Refill: Capillary refill takes less than 2 seconds.  Neurological:     General: No focal deficit present.     Mental Status: She is alert.     Coordination: Coordination abnormal.     Gait: Gait abnormal.  Psychiatric:        Mood and Affect: Mood normal.       Assessment & Plan:  Right flank pain Assessment & Plan: Ordered urinalysis,  urine culture and CT ABDOMEN PELVIS for further evaluation- awaiting results will follow up. Advise patient to increase water intake 2-2.5 Liters a day Pain management include hydrocodone-acetaminophen 5-325 mg   Orders: -     POCT urinalysis dipstick -     Urine Culture -     CT ABDOMEN PELVIS WO CONTRAST; Future -     HYDROcodone-Acetaminophen; Take 1 tablet by mouth every 8 (eight) hours as needed for up to 5 days for moderate pain or severe pain.  Dispense: 15 tablet; Refill: 0  Chronic midline low back pain without sciatica    No follow-ups on file.   Renard Hamper Ria Comment, FNP

## 2023-01-22 NOTE — Discharge Instructions (Signed)
You were seen for your back pain in the emergency department.   At home, please use Tylenol and lidocaine patches, and the oxycodone we have prescribed you as needed for pain.  Do not take the oxycodone with alcohol, before driving, or operating heavy machinery.  Follow-up with your primary doctor in 2-3 days regarding your visit.  Follow-up with the spine clinic as soon as possible regarding your symptoms.  Return immediately to the emergency department if you experience any of the following: Numbness or weakness of your legs, bowel or bladder incontinence, numbness while wiping after pooping or urinating, or any other concerning symptoms.    Thank you for visiting our Emergency Department. It was a pleasure taking care of you today.

## 2023-01-22 NOTE — Assessment & Plan Note (Addendum)
Ordered urinalysis, urine culture and CT ABDOMEN PELVIS for further evaluation- awaiting results will follow up. Advise patient to increase water intake 2-2.5 Liters a day Pain management include hydrocodone-acetaminophen 5-325 mg

## 2023-01-23 ENCOUNTER — Other Ambulatory Visit: Payer: Self-pay | Admitting: Family Medicine

## 2023-01-23 DIAGNOSIS — M545 Low back pain, unspecified: Secondary | ICD-10-CM

## 2023-01-24 LAB — URINE CULTURE

## 2023-01-27 ENCOUNTER — Other Ambulatory Visit: Payer: Self-pay

## 2023-01-27 ENCOUNTER — Encounter (HOSPITAL_COMMUNITY): Payer: Self-pay

## 2023-01-27 ENCOUNTER — Emergency Department (HOSPITAL_COMMUNITY): Payer: Medicare HMO

## 2023-01-27 ENCOUNTER — Emergency Department (HOSPITAL_COMMUNITY)
Admission: EM | Admit: 2023-01-27 | Discharge: 2023-01-27 | Disposition: A | Discharge status: Home / Self Care | Payer: Medicare HMO | Attending: Emergency Medicine | Admitting: Emergency Medicine

## 2023-01-27 DIAGNOSIS — Z794 Long term (current) use of insulin: Secondary | ICD-10-CM | POA: Diagnosis not present

## 2023-01-27 DIAGNOSIS — E119 Type 2 diabetes mellitus without complications: Secondary | ICD-10-CM | POA: Insufficient documentation

## 2023-01-27 DIAGNOSIS — M79601 Pain in right arm: Secondary | ICD-10-CM | POA: Diagnosis present

## 2023-01-27 DIAGNOSIS — M5412 Radiculopathy, cervical region: Secondary | ICD-10-CM | POA: Insufficient documentation

## 2023-01-27 DIAGNOSIS — I10 Essential (primary) hypertension: Secondary | ICD-10-CM | POA: Diagnosis not present

## 2023-01-27 MED ORDER — GABAPENTIN 100 MG PO CAPS: 100.0000 mg | ORAL_CAPSULE | Freq: Once | ORAL | Status: DC

## 2023-01-27 MED ORDER — GABAPENTIN 100 MG PO CAPS: 100.0000 mg | ORAL_CAPSULE | Freq: Three times a day (TID) | ORAL | 0 refills | Status: DC

## 2023-01-27 NOTE — ED Provider Notes (Signed)
[facility] Provider Note   CSN: [id number] Arrival date & time: 01/27/23  1445     History  Chief Complaint  Patient presents with   Arm Pain   HPI Veronica Murray is a 67 y.o. female with history of stroke, hypertension, diabetes and fibromyalgia presenting for right arm pain, numbness and weakness.  Started about a week ago.  Pain is all up and down the right arm.  Denies any recent trauma.  States that she routinely does sleep on the right arm. Denies cervical midline tenderness.  States that she feels like strength in her hand and arm are reduced on the right side.  She has had multiple strokes and concerned that she may be having strokelike symptoms now.  Denies facial droop, slurred speech, gait ataxia or loss of coordination.  Denies chest pain shortness of breath.  States she takes 2 Percocet today for pain.     Arm Pain       Home Medications Prior to Admission medications   Medication Sig Start Date End Date Taking? Authorizing Provider  gabapentin (NEURONTIN) 100 MG capsule Take 1 capsule (100 mg total) by mouth 3 (three) times daily. 01/27/23 02/26/23 Yes Quentin Cornwall, Janashia Parco K, PA-C  ACETAMINOPHEN EXTRA STRENGTH 500 MG tablet Take 1,000 mg by mouth 2 (two) times daily. 05/30/22   [provider]  albuterol (VENTOLIN HFA) 108 (90 Base) MCG/ACT inhaler Inhale 2 puffs into the lungs every 6 (six) hours as needed for wheezing or shortness of breath. 12/20/22   Del Eli Hose, FNP  atorvastatin (LIPITOR) 10 MG tablet Take 1 tablet (10 mg total) by mouth daily. 01/13/23   Del Eli Hose, FNP  glimepiride (AMARYL) 1 MG tablet Take 1 tablet (1 mg total) by mouth daily with breakfast. Patient not taking: Reported on 01/22/2023 01/03/23   Del Eli Hose, FNP  HYDROcodone-acetaminophen (NORCO) 5-325 MG tablet Take 1 tablet by mouth every 8 (eight) hours as needed for up to 5 days for moderate pain or severe  pain. Patient not taking: Reported on 01/22/2023 01/22/23 01/27/23  Del Eli Hose, FNP  ibuprofen (ADVIL) 600 MG tablet Take 600 mg by mouth every 6 (six) hours as needed for moderate pain.    [provider]  insulin glargine (LANTUS SOLOSTAR) 100 UNIT/ML Solostar Pen Inject 5 Units into the skin daily. 05/08/22   Gaylan Gerold, DO  lisinopril (ZESTRIL) 5 MG tablet Take 1 tablet (5 mg total) by mouth daily. Patient not taking: Reported on 01/22/2023 01/13/23   Del Eli Hose, FNP  oxyCODONE (ROXICODONE) 5 MG immediate release tablet Take 1 tablet (5 mg total) by mouth every 4 (four) hours as needed for severe pain. 01/22/23   Fransico Meadow, MD      Allergies    Codeine, Toradol [ketorolac tromethamine], and Tramadol hcl    Review of Systems   See HPI for pertinent positives  Physical Exam Updated Vital Signs BP 120/65   Pulse 79   Temp 98 F (36.7 C) (Oral)   Resp 12   Ht 5\' 2"  (1.575 m)   Wt 69.4 kg   SpO2 98%   BMI 27.98 kg/m  Physical Exam Vitals and nursing note reviewed.  Constitutional:      Appearance: Normal appearance.  HENT:     Head: Normocephalic and atraumatic.     Nose: Nose normal.     Mouth/Throat:     Mouth: Mucous membranes  are moist.  Eyes:     General:        Right eye: No discharge.        Left eye: No discharge.     Conjunctiva/sclera: Conjunctivae normal.  Cardiovascular:     Rate and Rhythm: Normal rate and regular rhythm.     Pulses: Normal pulses.     Heart sounds: Normal heart sounds.  Pulmonary:     Effort: Pulmonary effort is normal.     Breath sounds: Normal breath sounds.  Abdominal:     General: Abdomen is flat.     Palpations: Abdomen is soft.  Musculoskeletal:     Comments: Neck and shoulders appear symmetric.  5/5 strength and sensation in both extremities  Skin:    General: Skin is warm and dry.  Neurological:     General: No focal deficit present.     Mental Status: She is alert.     Comments:  GCS 15. Speech is goal oriented. No deficits appreciated to CN III-XII; symmetric eyebrow raise, no facial drooping, tongue midline. Patient has equal grip strength bilaterally with 5/5 strength against resistance in all major muscle groups bilaterally. Sensation to light touch intact. Patient moves extremities without ataxia. Normal finger-nose-finger. Patient ambulatory with steady gait.  Psychiatric:        Mood and Affect: Mood normal.     ED Results / Procedures / Treatments   Labs (all labs ordered are listed, but only abnormal results are displayed) Labs Reviewed - No data to display  EKG None  Radiology CT Cervical Spine Wo Contrast  Result Date: 01/27/2023 CLINICAL DATA:  Cervical radiculopathy. EXAM: CT CERVICAL SPINE WITHOUT CONTRAST TECHNIQUE: Multidetector CT imaging of the cervical spine was performed without intravenous contrast. Multiplanar CT image reconstructions were also generated. RADIATION DOSE REDUCTION: This exam was performed according to the departmental dose-optimization program which includes automated exposure control, adjustment of the mA and/or kV according to patient size and/or use of iterative reconstruction technique. COMPARISON:  None Available. FINDINGS: Alignment: No acute subluxation. Skull base and vertebrae: No acute fracture. Soft tissues and spinal canal: No acute findings. Disc levels: Degenerative changes at C5-C6 with disc space narrowing and osteophyte/spurring. There is resulting bilateral neural foramina narrowing at C5-C6, left greater than right. Upper chest: Apparent 6 mm ground-glass focus in the right apex (28/6) may be related to volume averaging and interstitial crowding. A pulmonary nodule is not excluded. Follow-up with chest CT is recommended. Other: Left carotid artery stent. Right carotid bulb calcified plaque. IMPRESSION: 1. No acute/traumatic cervical spine pathology. 2. Degenerative changes and bilateral neural foramina narrowing at  C5-C6. 3. Interstitial crowding versus a 6 mm right apical ground-glass nodule. Initial follow-up with CT at 6 months is recommended to confirm persistence. If persistent, repeat CT is recommended every 2 years until 5 years of stability has been established. This recommendation follows the consensus statement: Guidelines for Management of Incidental Pulmonary Nodules Detected on CT Images: From the Fleischner Society 2017; Radiology 2017; 284:228-243. Electronically Signed   By: Anner Crete M.D.   On: 01/27/2023 17:17   DG Shoulder Right  Result Date: 01/27/2023 CLINICAL DATA:  Right shoulder pain for 1 week. EXAM: RIGHT SHOULDER - 2+ VIEW COMPARISON:  None Available. FINDINGS: Mild AC joint and glenohumeral joint degenerative changes. No acute bony findings or destructive bony changes. No abnormal soft tissue calcifications. The visualized right ribs are grossly intact. IMPRESSION: 1. Mild AC joint and glenohumeral joint degenerative changes. 2. No acute  bony findings. Electronically Signed   By: Marijo Sanes M.D.   On: 01/27/2023 16:41    Procedures Procedures    Medications Ordered in ED Medications  gabapentin (NEURONTIN) capsule 100 mg (has no administration in time range)    ED Course/ Medical Decision Making/ A&P                             Medical Decision Making Amount and/or Complexity of Data Reviewed Radiology: ordered.   67 year old female who is well-appearing presenting for right arm pain, numbness and weakness.  Exam was overall unremarkable, thought overall her strength and sensation was 5/5 bilaterally in her upper extremities.  DDx includes stroke, radiculopathy, shoulder dislocation or fracture, and vascular injury.  CT scan did reveal foraminal narrowing at C5 and C6.  Suspect may be causing radiculopathy to the right arm.  Treated with gabapentin.  Patient allergic to NSAIDs.  Advised to follow-up with neurosurgery.  Low suspicion for stroke given no focal  neurodeficits, symmetric strength and sensation in upper and lower extremities. Discussed return precautions. Discharged stable vitals.        Final Clinical Impression(s) / ED Diagnoses Final diagnoses:  Cervical radiculopathy    Rx / DC Orders ED Discharge Orders          Ordered    gabapentin (NEURONTIN) 100 MG capsule  3 times daily        01/27/23 1748              Harriet Pho, PA-C 01/27/23 1751    Noemi Chapel, MD 01/28/23 [phone]

## 2023-01-27 NOTE — ED Triage Notes (Signed)
Pt reports right arm pain from the shoulder down x 1 week.  Pain increases with abduction of arm.

## 2023-01-27 NOTE — ED Notes (Signed)
Pt left the room without the pain medication, was given her discharge papers.

## 2023-01-27 NOTE — Discharge Instructions (Addendum)
Evaluation revealed that you likely have nerve radiculopathy from some narrowing in your cervical spine.  I am treating you with gabapentin.  Recommend they also follow-up with neurosurgery.  If you have new facial droop, slurred speech, new numbness or weakness in your extremities, gait change, severe headache or any other concerning symptom please return emergency department further evaluation.

## 2023-01-28 ENCOUNTER — Telehealth: Payer: Self-pay

## 2023-01-28 NOTE — Transitions of Care (Post Inpatient/ED Visit) (Unsigned)
   01/28/2023  Name: Veronica Murray MRN: [id number] DOB: 1956/07/22  Today's TOC FU Call Status: Today's TOC FU Call Status:: Unsuccessul Call (1st Attempt) Unsuccessful Call (1st Attempt) Date: 01/28/23  Attempted to reach the patient regarding the most recent Inpatient/ED visit.  Follow Up Plan: Additional outreach attempts will be made to reach the patient to complete the Transitions of Care (Post Inpatient/ED visit) call.   [place] LPN [place] Advisor Direct Dial [phone]

## 2023-01-29 ENCOUNTER — Other Ambulatory Visit (INDEPENDENT_AMBULATORY_CARE_PROVIDER_SITE_OTHER): Payer: Self-pay | Admitting: Nurse Practitioner

## 2023-01-29 ENCOUNTER — Telehealth: Payer: Self-pay | Admitting: Family Medicine

## 2023-01-29 DIAGNOSIS — I6522 Occlusion and stenosis of left carotid artery: Secondary | ICD-10-CM

## 2023-01-29 NOTE — Telephone Encounter (Signed)
Patient came by the office and has appointment with Dr Gerarda Gunther with [facility] on 04.02.2024 and this provider told the patient that her pcp should prescribe enough pain medicine until 04.02.2024.  patient # [phone].

## 2023-01-30 ENCOUNTER — Telehealth: Payer: Self-pay | Admitting: Family Medicine

## 2023-01-30 NOTE — Transitions of Care (Post Inpatient/ED Visit) (Signed)
   01/30/2023  Name: Veronica Murray MRN: [id number] DOB: Jan 05, 1956  Today's TOC FU Call Status: Today's TOC FU Call Status:: Successful TOC FU Call Competed Unsuccessful Call (1st Attempt) Date: 01/28/23 [facility] FU Call Complete Date: 01/30/23  Transition Care Management Follow-up Telephone Call Date of Discharge: 01/27/23 Discharge [facility]: [facility] Type of Discharge: Emergency Department Reason for ED Visit: Other: How have you been since you were released from the hospital?: Same (pt having alot of pain - pain clinic can't see pt until 02-11-23-) Any questions or concerns?: No  Items Reviewed: Did you receive and understand the discharge instructions provided?: Yes Medications obtained and verified?: Yes (Medications Reviewed) Any new allergies since your discharge?: No Dietary orders reviewed?: NA Do you have support at home?: Yes  Home Care and Equipment/Supplies: [place] Ordered?: NA Any new equipment or medical supplies ordered?: NA  Functional Questionnaire: Do you need assistance with bathing/showering or dressing?: No Do you need assistance with meal preparation?: No Do you need assistance with eating?: No Do you have difficulty maintaining continence: No Do you need assistance with getting out of bed/getting out of a chair/moving?: No Do you have difficulty managing or taking your medications?: No  Follow up appointments reviewed: PCP Follow-up appointment confirmed?: NA Specialist Hospital Follow-up appointment confirmed?: Yes Date of Specialist follow-up appointment?: 02/04/23 Follow-Up Specialty Provider:: Dr Lucky Cowboy Do you need transportation to your follow-up appointment?: No Do you understand care options if your condition(s) worsen?: Yes-patient verbalized understanding    [place] LPN [place] Direct Dial [phone]

## 2023-01-30 NOTE — Telephone Encounter (Signed)
Called patient, no answer. Left VM. A provider at a different office gave pain meds last week.

## 2023-01-30 NOTE — Telephone Encounter (Signed)
Pt returned call

## 2023-01-30 NOTE — Telephone Encounter (Signed)
Pt states she brought paperwork into office on 3/20 in regard to pain meds. Has not heard anything in regard. Wants a call back.

## 2023-01-31 ENCOUNTER — Other Ambulatory Visit: Payer: Self-pay | Admitting: Family Medicine

## 2023-01-31 ENCOUNTER — Telehealth: Payer: Self-pay | Admitting: Family Medicine

## 2023-01-31 ENCOUNTER — Encounter (HOSPITAL_COMMUNITY): Payer: Self-pay

## 2023-01-31 ENCOUNTER — Ambulatory Visit (HOSPITAL_COMMUNITY)
Admission: EM | Admit: 2023-01-31 | Discharge: 2023-01-31 | Disposition: A | Discharge status: Home / Self Care | Payer: Medicare HMO | Attending: Emergency Medicine | Admitting: Emergency Medicine

## 2023-01-31 DIAGNOSIS — G8929 Other chronic pain: Secondary | ICD-10-CM

## 2023-01-31 DIAGNOSIS — E1165 Type 2 diabetes mellitus with hyperglycemia: Secondary | ICD-10-CM | POA: Diagnosis not present

## 2023-01-31 DIAGNOSIS — M79601 Pain in right arm: Secondary | ICD-10-CM | POA: Diagnosis not present

## 2023-01-31 DIAGNOSIS — G894 Chronic pain syndrome: Secondary | ICD-10-CM

## 2023-01-31 LAB — CBG MONITORING, ED: Glucose-Capillary: 155 mg/dL — ABNORMAL HIGH (ref 70–99)

## 2023-01-31 MED ORDER — DEXAMETHASONE SODIUM PHOSPHATE 10 MG/ML IJ SOLN
10.0000 mg | Freq: Once | INTRAMUSCULAR | Status: AC
  Administered 2023-01-31: 10 mg via INTRAMUSCULAR

## 2023-01-31 MED ORDER — OXYCODONE HCL 5 MG PO TABS: 5.0000 mg | ORAL_TABLET | Freq: Two times a day (BID) | ORAL | 0 refills | Status: AC | PRN

## 2023-01-31 MED ORDER — DEXAMETHASONE SODIUM PHOSPHATE 10 MG/ML IJ SOLN
INTRAMUSCULAR | Status: AC
  Filled 2023-01-31: qty 1

## 2023-01-31 NOTE — ED Provider Notes (Signed)
[place]    CSN: [id number] Arrival date & time: 01/31/23  1559      History   Chief Complaint Chief Complaint  Patient presents with   Arm Pain    HPI Veronica Murray is a 67 y.o. female.  Here with several week history of right arm pain She was seen in the emergency department 4 days ago and had a CT scan of the C spine, x-ray of the right shoulder. She was put on gabapentin and advised to follow with neurosurgery. Gabapentin was prescribed 100 mg 3x daily prn, she has been taking 4 pills several times daily.  Is also taking ibuprofen 800 mg  Additionally takes 2 Percocets daily. She had 15 tablets prescribed on 3/13 by her primary care. She is out now. Showed me her empty bottles and said that's what the ED had prescribed and needs refill. She is followed by pain clinic. Next appointment is on 4/2  Hx DM. Glucose after eating yesterday was 250s  Past Medical History:  Diagnosis Date   Aortic arch atherosclerosis ([place])    Diabetes mellitus without complication ([place])    Encephalopathy    Essential hypertension    Fibromyalgia    Stroke [facility])    Tobacco abuse     Patient Active Problem List   Diagnosis Date Noted   Right flank pain 01/22/2023   Chronic back pain 12/20/2022   AMS (altered mental status) 06/05/2022   UTI (urinary tract infection) 06/04/2022   Hypotension 06/04/2022   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   Dehydration    Acute delirium 05/05/2022   Acute encephalopathy 05/05/2022   Viral gastroenteritis 05/04/2022   Hip pain 03/06/2022   CVA (cerebral vascular accident) ([place]) 03/05/2022   DM2 (diabetes mellitus, type 2) ([place]) 03/05/2022   Hypokalemia 03/05/2022   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   Normocytic anemia 03/05/2022   Leukocytosis 03/05/2022   Hyponatremia 03/05/2022   Rupture of carotid artery (HCC)    Adjustment disorder with mixed anxiety and depressed mood 01/25/2022   Carotid stenosis, left 01/24/2022    Carotid stenosis 01/18/2022   CAD (coronary artery disease) 01/18/2022   Essential hypertension 01/18/2022   Bipolar disorder ([place]) 03/05/2021   Depression 03/05/2021   Fibromyalgia 03/05/2021   Panic attacks 03/05/2021   Smoker 03/05/2021    Past Surgical History:  Procedure Laterality Date   CAROTID PTA/STENT INTERVENTION Left 01/24/2022   Procedure: CAROTID PTA/STENT INTERVENTION;  Surgeon: Algernon Huxley, MD;  Location: [place];  Service: Cardiovascular;  Laterality: Left;   CAROTID PTA/STENT INTERVENTION Left 02/28/2022   Procedure: CAROTID PTA/STENT INTERVENTION;  Surgeon: Algernon Huxley, MD;  Location: [place] [facility];  Service: Cardiovascular;  Laterality: Left;   CERVICAL BIOPSY     CHOLECYSTECTOMY     ENDARTERECTOMY Left 01/25/2022   Procedure: ENDARTERECTOMY CAROTID, possible ligation;  Surgeon: Algernon Huxley, MD;  Location: [facility];  Service: Vascular;  Laterality: Left;   ENDARTERECTOMY Left 02/25/2022   Procedure: ENDARTERECTOMY CAROTID revision;  Surgeon: Evaristo Bury, MD;  Location: [facility];  Service: Vascular;  Laterality: Left;   HERNIA REPAIR     LEFT HEART CATH AND CORONARY ANGIOGRAPHY Left 07/05/2019   Procedure: LEFT HEART CATH AND CORONARY ANGIOGRAPHY;  Surgeon: Wellington Hampshire, MD;  Location: [place] [facility];  Service: Cardiovascular;  Laterality: Left;    OB History   No obstetric history on file.      Home Medications    Prior  to Admission medications   Medication Sig Start Date End Date Taking? Authorizing Provider  ACETAMINOPHEN EXTRA STRENGTH 500 MG tablet Take 1,000 mg by mouth 2 (two) times daily. 05/30/22   [provider]  albuterol (VENTOLIN HFA) 108 (90 Base) MCG/ACT inhaler Inhale 2 puffs into the lungs every 6 (six) hours as needed for wheezing or shortness of breath. 12/20/22   Del Eli Hose, FNP  atorvastatin (LIPITOR) 10 MG tablet Take 1 tablet (10 mg total) by mouth daily. 01/13/23   Del Eli Hose, FNP  gabapentin (NEURONTIN) 100 MG capsule Take 1 capsule (100 mg total) by mouth 3 (three) times daily. 01/27/23 02/26/23  Harriet Pho, PA-C  glimepiride (AMARYL) 1 MG tablet Take 1 tablet (1 mg total) by mouth daily with breakfast. 01/03/23   Del Ria Comment, Lamar Benes, FNP  ibuprofen (ADVIL) 600 MG tablet Take 600 mg by mouth every 6 (six) hours as needed for moderate pain.    [provider]  insulin glargine (LANTUS SOLOSTAR) 100 UNIT/ML Solostar Pen Inject 5 Units into the skin daily. 05/08/22   Gaylan Gerold, DO  lisinopril (ZESTRIL) 5 MG tablet Take 1 tablet (5 mg total) by mouth daily. 01/13/23   Del Eli Hose, FNP  oxyCODONE (ROXICODONE) 5 MG immediate release tablet Take 1 tablet (5 mg total) by mouth every 4 (four) hours as needed for severe pain. 01/22/23   Fransico Meadow, MD    Family History Family History  Problem Relation Age of Onset   CAD Neg Hx     Social History Social History   Tobacco Use   Smoking status: Every Day    Packs/day: 1.00    Years: 50.00    Additional pack years: 0.00    Total pack years: 50.00    Types: Cigarettes   Smokeless tobacco: Never  Vaping Use   Vaping Use: Never used  Substance Use Topics   Alcohol use: Not Currently   Drug use: Not Currently    Frequency: 7.0 times per week    Types: Marijuana     Allergies   Codeine, Toradol [ketorolac tromethamine], and Tramadol hcl   Review of Systems Review of Systems As per HPI  Physical Exam Triage Vital Signs ED Triage Vitals [01/31/23 1636]  Enc Vitals Group     BP 133/84     Pulse Rate 82     Resp 18     Temp 98.1 F (36.7 C)     Temp Source Oral     SpO2 98 %     Weight      Height      Head Circumference      Peak Flow      Pain Score      Pain Loc      Pain Edu?      Excl. in [place]?    No data found.  Updated Vital Signs BP 133/84 (BP Location: Left Arm)   Pulse 82   Temp 98.1 F (36.7 C) (Oral)   Resp 18   SpO2 98%     Physical Exam Vitals and nursing note reviewed.  Constitutional:      General: She is not in acute distress. HENT:     Mouth/Throat:     Pharynx: Oropharynx is clear.  Cardiovascular:     Rate and Rhythm: Normal rate and regular rhythm.     Pulses: Normal pulses.     Heart sounds: Normal heart sounds.  Pulmonary:  Effort: Pulmonary effort is normal.     Breath sounds: Normal breath sounds.  Musculoskeletal:     Right shoulder: No swelling or bony tenderness. Decreased range of motion. Normal strength. Normal pulse.     Cervical back: Normal range of motion. No tenderness.     Comments: Can lift to about 70-80 degrees flexion and abduction before pain. No bony tenderness at C spine, shoulder, elbow, wrist. Grip strength intact. Sensation, cap refill, pulses normal. No obvious deformity   Skin:    General: Skin is warm and dry.  Neurological:     Mental Status: She is alert and oriented to person, place, and time.     UC Treatments / Results  Labs (all labs ordered are listed, but only abnormal results are displayed) Labs Reviewed  CBG MONITORING, ED - Abnormal; Notable for the following components:      Result Value   Glucose-Capillary 155 (*)    All other components within normal limits    EKG   Radiology No results found.  Procedures Procedures (including critical care time)  Medications Ordered in UC Medications  dexamethasone (DECADRON) injection 10 mg (10 mg Intramuscular Given 01/31/23 1657)    Initial Impression / Assessment and Plan / UC Course  I have reviewed the triage vital signs and the nursing notes.  Pertinent labs & imaging results that were available during my care of the patient were reviewed by me and considered in my medical decision making (see chart for details).  Discussed urgent care does not manage chronic pain and cannot refill her narcotic prescriptions, she needs to go through the pain clinic for this  Offered steroid  injection which may have helped her in the past CBG 155 IM Decadron given. Advised to monitor blood glucose at home.   Recommend tylenol at home and gabapentin only as prescribed. Call neurosurgery to set up appointment and follow with pain clinic. Provided ortho clinic as well  Final Clinical Impressions(s) / UC Diagnoses   Final diagnoses:  Right arm pain  Other chronic pain     Discharge Instructions      The steroid injection given today may help with inflammation and pain.  Make sure you are monitoring your blood sugars over the next several days and adjust insulin as needed.  The steroid may increase your blood sugar.  You can use Tylenol at home as needed.  Please call the pain clinic regarding your narcotic medications.  Call the orthopedic and neurosurgery clinics first thing Monday morning to set up appointment for follow-up.     ED Prescriptions   None    PDMP not reviewed this encounter.   Kyra Leyland 01/31/23 1746

## 2023-01-31 NOTE — Telephone Encounter (Signed)
Sent!

## 2023-01-31 NOTE — ED Triage Notes (Signed)
Pt reports right arm pain for several weeks. She was told she has two breaks in her back. Denies any trauma and falls.

## 2023-01-31 NOTE — Telephone Encounter (Signed)
Pt left vm while at lunch wanting a call back   [phone]

## 2023-01-31 NOTE — Discharge Instructions (Signed)
The steroid injection given today may help with inflammation and pain.  Make sure you are monitoring your blood sugars over the next several days and adjust insulin as needed.  The steroid may increase your blood sugar.  You can use Tylenol at home as needed.  Please call the pain clinic regarding your narcotic medications.  Call the orthopedic and neurosurgery clinics first thing Monday morning to set up appointment for follow-up.

## 2023-02-04 ENCOUNTER — Ambulatory Visit (INDEPENDENT_AMBULATORY_CARE_PROVIDER_SITE_OTHER): Payer: Medicare HMO | Admitting: Vascular Surgery

## 2023-02-04 ENCOUNTER — Encounter (INDEPENDENT_AMBULATORY_CARE_PROVIDER_SITE_OTHER): Payer: Self-pay | Admitting: Vascular Surgery

## 2023-02-04 ENCOUNTER — Ambulatory Visit (INDEPENDENT_AMBULATORY_CARE_PROVIDER_SITE_OTHER): Payer: Medicare HMO

## 2023-02-04 VITALS — BP 96/62 | HR 79 | Resp 16 | Wt 153.0 lb

## 2023-02-04 DIAGNOSIS — E119 Type 2 diabetes mellitus without complications: Secondary | ICD-10-CM

## 2023-02-04 DIAGNOSIS — Z794 Long term (current) use of insulin: Secondary | ICD-10-CM | POA: Diagnosis not present

## 2023-02-04 DIAGNOSIS — I6522 Occlusion and stenosis of left carotid artery: Secondary | ICD-10-CM

## 2023-02-04 DIAGNOSIS — I10 Essential (primary) hypertension: Secondary | ICD-10-CM

## 2023-02-04 NOTE — Telephone Encounter (Signed)
Patient asked please for nurse or provider to return her call she has been trying for 5 days now. Please return patient call very upset. Phone # [phone]

## 2023-02-04 NOTE — Assessment & Plan Note (Signed)
Her carotid duplex today demonstrates 1 to 39% right ICA stenosis and a widely patent left carotid stent.  Not entirely clear what she is taking currently, but I would recommend at least aspirin and a statin agent and Plavix would be reasonable as well.  No restrictions from vascular point of view for any upcoming surgery for her back or other procedures.  Recommend annual follow-up with duplex.

## 2023-02-04 NOTE — Assessment & Plan Note (Signed)
blood glucose control important in reducing the progression of atherosclerotic disease. Also, involved in wound healing. On appropriate medications.  

## 2023-02-04 NOTE — Progress Notes (Signed)
MRN : [id number]  Veronica Murray is a 67 y.o. ([birth year]) female who presents with chief complaint of  Chief Complaint  Patient presents with   Follow-up    Ultrasound follow up  .  History of Present Illness: Patient returns in follow-up of her carotid disease.  Her biggest complaint today is of back pain.  She has a previous history of TIA and is 1 year status post left carotid endarterectomy.  She had a complicated course in several weeks after her surgery, returned with a large neck hematoma requiring surgical exploration and evacuation of hematoma.  She remained intubated for several days, and several days later we performed a covered stent to exclude any further bleeding.  She ultimately did well and recovered fine from this.  She has had no focal neurologic symptoms since her last visit. Specifically, the patient denies amaurosis fugax, speech or swallowing difficulties, or arm or leg weakness or numbness.  Her carotid duplex today demonstrates 1 to 39% right ICA stenosis and a widely patent left carotid stent.  Current Outpatient Medications  Medication Sig Dispense Refill   gabapentin (NEURONTIN) 100 MG capsule Take 1 capsule (100 mg total) by mouth 3 (three) times daily. 90 capsule 0   insulin glargine (LANTUS SOLOSTAR) 100 UNIT/ML Solostar Pen Inject 5 Units into the skin daily. 3 mL 0   ACETAMINOPHEN EXTRA STRENGTH 500 MG tablet Take 1,000 mg by mouth 2 (two) times daily. (Patient not taking: Reported on 02/04/2023)     albuterol (VENTOLIN HFA) 108 (90 Base) MCG/ACT inhaler Inhale 2 puffs into the lungs every 6 (six) hours as needed for wheezing or shortness of breath. (Patient not taking: Reported on 02/04/2023) 8 g 2   atorvastatin (LIPITOR) 10 MG tablet Take 1 tablet (10 mg total) by mouth daily. (Patient not taking: Reported on 02/04/2023) 90 tablet 3   glimepiride (AMARYL) 1 MG tablet Take 1 tablet (1 mg total) by mouth daily with breakfast. (Patient not taking: Reported on  02/04/2023) 30 tablet 3   ibuprofen (ADVIL) 600 MG tablet Take 600 mg by mouth every 6 (six) hours as needed for moderate pain. (Patient not taking: Reported on 02/04/2023)     lisinopril (ZESTRIL) 5 MG tablet Take 1 tablet (5 mg total) by mouth daily. (Patient not taking: Reported on 02/04/2023) 90 tablet 3   oxyCODONE (ROXICODONE) 5 MG immediate release tablet Take 1 tablet (5 mg total) by mouth every 12 (twelve) hours as needed for up to 5 days for severe pain. (Patient not taking: Reported on 02/04/2023) 10 tablet 0   No current facility-administered medications for this visit.    Past Medical History:  Diagnosis Date   Aortic arch atherosclerosis ([place])    Diabetes mellitus without complication ([place])    Encephalopathy    Essential hypertension    Fibromyalgia    Stroke ([place])    Tobacco abuse     Past Surgical History:  Procedure Laterality Date   CAROTID PTA/STENT INTERVENTION Left 01/24/2022   Procedure: CAROTID PTA/STENT INTERVENTION;  Surgeon: Algernon Huxley, MD;  Location: [facility];  Service: Cardiovascular;  Laterality: Left;   CAROTID PTA/STENT INTERVENTION Left 02/28/2022   Procedure: CAROTID PTA/STENT INTERVENTION;  Surgeon: Algernon Huxley, MD;  Location: [facility];  Service: Cardiovascular;  Laterality: Left;   CERVICAL BIOPSY     CHOLECYSTECTOMY     ENDARTERECTOMY Left 01/25/2022   Procedure: ENDARTERECTOMY CAROTID, possible ligation;  Surgeon: Algernon Huxley, MD;  Location: [facility];  Service: Vascular;  Laterality: Left;   ENDARTERECTOMY Left 02/25/2022   Procedure: ENDARTERECTOMY CAROTID revision;  Surgeon: Evaristo Bury, MD;  Location: [facility];  Service: Vascular;  Laterality: Left;   HERNIA REPAIR     LEFT HEART CATH AND CORONARY ANGIOGRAPHY Left 07/05/2019   Procedure: LEFT HEART CATH AND CORONARY ANGIOGRAPHY;  Surgeon: Wellington Hampshire, MD;  Location: [facility];  Service: Cardiovascular;  Laterality: Left;     Social History    Tobacco Use   Smoking status: Every Day    Packs/day: 1.00    Years: 50.00    Additional pack years: 0.00    Total pack years: 50.00    Types: Cigarettes   Smokeless tobacco: Never  Vaping Use   Vaping Use: Never used  Substance Use Topics   Alcohol use: Not Currently   Drug use: Not Currently    Frequency: 7.0 times per week    Types: Marijuana      Family History  Problem Relation Age of Onset   CAD Neg Hx   No bleeding or clotting disorders  Allergies  Allergen Reactions   Codeine Hives, Shortness Of Breath and Nausea Only   Toradol [Ketorolac Tromethamine] Hives, Shortness Of Breath and Nausea Only   Tramadol Hcl Hives, Shortness Of Breath and Nausea Only    "Conflicts with bipolar condition"     REVIEW OF SYSTEMS (Negative unless checked)  Constitutional: [] Weight loss  [] Fever  [] Chills Cardiac: [] Chest pain   [] Chest pressure   [] Palpitations   [] Shortness of breath when laying flat   [] Shortness of breath at rest   [x] Shortness of breath with exertion. Vascular:  [] Pain in legs with walking   [] Pain in legs at rest   [] Pain in legs when laying flat   [] Claudication   [] Pain in feet when walking  [] Pain in feet at rest  [] Pain in feet when laying flat   [] History of DVT   [] Phlebitis   [] Swelling in legs   [] Varicose veins   [] Non-healing ulcers Pulmonary:   [] Uses home oxygen   [] Productive cough   [] Hemoptysis   [] Wheeze  [] COPD   [] Asthma Neurologic:  [] Dizziness  [] Blackouts   [] Seizures   [] History of stroke   [] History of TIA  [] Aphasia   [x] Temporary blindness   [] Dysphagia   [] Weakness or numbness in arms   [] Weakness or numbness in legs Musculoskeletal:  [] Arthritis   [] Joint swelling   [] Joint pain   [] Low back pain Hematologic:  [] Easy bruising  [] Easy bleeding   [] Hypercoagulable state   [] Anemic  [] Hepatitis Gastrointestinal:  [] Blood in stool   [] Vomiting blood  [] Gastroesophageal reflux/heartburn   [] Difficulty swallowing. Genitourinary:  [] Chronic  kidney disease   [] Difficult urination  [] Frequent urination  [] Burning with urination   [] Blood in urine Skin:  [] Rashes   [] Ulcers   [] Wounds Psychological:  [] History of anxiety   []  History of major depression.  Physical Examination  Vitals:   02/04/23 1510  BP: 96/62  Pulse: 79  Resp: 16  Weight: 153 lb (69.4 kg)   Body mass index is 27.98 kg/m. Gen:  WD/WN, NAD Head: [place]/AT, No temporalis wasting. Ear/Nose/Throat: Hearing grossly intact, nares w/o erythema or drainage, trachea midline Eyes: Conjunctiva clear. Sclera non-icteric Neck: Supple.  No bruit  Pulmonary:  Good air movement, equal and clear to auscultation bilaterally.  Cardiac: RRR, No JVD Vascular:  Vessel Right Left  Radial Palpable Palpable  Musculoskeletal: M/S 5/5 throughout.  No deformity or atrophy.  No edema. Neurologic: CN 2-12 intact. Sensation grossly intact in extremities.  Symmetrical.  Speech is fluent. Motor exam as listed above. Psychiatric: Judgment intact, Mood & affect appropriate for pt's clinical situation. Dermatologic: No rashes or ulcers noted.  No cellulitis or open wounds.    CBC Lab Results  Component Value Date   WBC 5.8 12/20/2022   HGB 13.2 12/20/2022   HCT 38.2 12/20/2022   MCV 87 12/20/2022   PLT 235 12/20/2022    BMET    Component Value Date/Time   NA 135 12/20/2022 1611   K 4.5 12/20/2022 1611   CL 98 12/20/2022 1611   CO2 20 12/20/2022 1611   GLUCOSE 151 (H) 12/20/2022 1611   GLUCOSE 147 (H) 06/06/2022 0340   BUN 10 12/20/2022 1611   CREATININE 0.55 (L) 12/20/2022 1611   CALCIUM 9.3 12/20/2022 1611   GFRNONAA >60 06/06/2022 0340   GFRAA >60 06/26/2019 0434   CrCl cannot be calculated (Patient's most recent lab result is older than the maximum 21 days allowed.).  COAG Lab Results  Component Value Date   INR 1.1 03/08/2022   INR 1.1 02/25/2022   INR 1.0 02/24/2022    Radiology CT Cervical Spine Wo Contrast  Result Date: 01/27/2023 CLINICAL  DATA:  Cervical radiculopathy. EXAM: CT CERVICAL SPINE WITHOUT CONTRAST TECHNIQUE: Multidetector CT imaging of the cervical spine was performed without intravenous contrast. Multiplanar CT image reconstructions were also generated. RADIATION DOSE REDUCTION: This exam was performed according to the departmental dose-optimization program which includes automated exposure control, adjustment of the mA and/or kV according to patient size and/or use of iterative reconstruction technique. COMPARISON:  None Available. FINDINGS: Alignment: No acute subluxation. Skull base and vertebrae: No acute fracture. Soft tissues and spinal canal: No acute findings. Disc levels: Degenerative changes at C5-C6 with disc space narrowing and osteophyte/spurring. There is resulting bilateral neural foramina narrowing at C5-C6, left greater than right. Upper chest: Apparent 6 mm ground-glass focus in the right apex (28/6) may be related to volume averaging and interstitial crowding. A pulmonary nodule is not excluded. Follow-up with chest CT is recommended. Other: Left carotid artery stent. Right carotid bulb calcified plaque. IMPRESSION: 1. No acute/traumatic cervical spine pathology. 2. Degenerative changes and bilateral neural foramina narrowing at C5-C6. 3. Interstitial crowding versus a 6 mm right apical ground-glass nodule. Initial follow-up with CT at 6 months is recommended to confirm persistence. If persistent, repeat CT is recommended every 2 years until 5 years of stability has been established. This recommendation follows the consensus statement: Guidelines for Management of Incidental Pulmonary Nodules Detected on CT Images: From the Fleischner Society 2017; Radiology 2017; 284:228-243. Electronically Signed   By: Anner Crete M.D.   On: 01/27/2023 17:17   DG Shoulder Right  Result Date: 01/27/2023 CLINICAL DATA:  Right shoulder pain for 1 week. EXAM: RIGHT SHOULDER - 2+ VIEW COMPARISON:  None Available. FINDINGS: Mild  AC joint and glenohumeral joint degenerative changes. No acute bony findings or destructive bony changes. No abnormal soft tissue calcifications. The visualized right ribs are grossly intact. IMPRESSION: 1. Mild AC joint and glenohumeral joint degenerative changes. 2. No acute bony findings. Electronically Signed   By: Marijo Sanes M.D.   On: 01/27/2023 16:41   CT L-SPINE NO CHARGE  Result Date: 01/22/2023 CLINICAL DATA:  Back pain radiating to right lower extremity EXAM: CT LUMBAR SPINE WITHOUT CONTRAST TECHNIQUE: Multidetector CT imaging of the lumbar spine was  performed without intravenous contrast administration. Multiplanar CT image reconstructions were also generated. RADIATION DOSE REDUCTION: This exam was performed according to the departmental dose-optimization program which includes automated exposure control, adjustment of the mA and/or kV according to patient size and/or use of iterative reconstruction technique. COMPARISON:  05/04/2022, 01/22/2023 FINDINGS: Segmentation: 5 lumbar type vertebrae. Alignment: Kyphosis centered at a chronic L1 compression deformity. Otherwise alignment is anatomic. Vertebrae: There is a chronic L1 compression fracture, with progressive loss of height since the 05/04/2022 exam. Greater than 75% loss of height now identified, with stable 6 mm retropulsion again identified. There is a new compression deformity through the superior endplate of the L4 vertebral body since the 05/04/2022 exam, with less than 25% loss of height. Chronic compression deformity of the L5 vertebral body with less than 25% loss of vertebral body height. No other acute or destructive bony abnormalities. Paraspinal and other soft tissues: The paraspinal soft tissues are unremarkable. Atherosclerosis throughout the aorta and its branches. Disc levels: At T12/L1 there is moderate central canal stenosis due to retropulsion from the prior L1 compression fracture. At L3-4 and L4-5 there is  circumferential disc bulge and bilateral facet hypertrophy resulting in mild central canal stenosis. At L5-S1 there is central disc protrusion and bilateral facet hypertrophy resulting in mild central canal stenosis. Reconstructed images demonstrate no additional findings. IMPRESSION: 1. New compression deformity within the superior endplate of the L4 vertebral body, with less than 25% loss of height. This is age indeterminate, but has developed in the interim since the 05/04/2022 exam. 2. Chronic L1 compression deformity, with progressive loss of height as above. Stable 6 mm retropulsion with resulting central canal stenosis. 3. Chronic L5 compression deformity without compressive sequela or retropulsion. 4. Multilevel lumbar spondylosis greatest from L3-4 through L5-S1. Electronically Signed   By: Randa Ngo M.D.   On: 01/22/2023 19:02   CT Renal Stone Study  Result Date: 01/22/2023 CLINICAL DATA:  Abdominal and flank pain.  Kidney stones suspected. EXAM: CT ABDOMEN AND PELVIS WITHOUT CONTRAST TECHNIQUE: Multidetector CT imaging of the abdomen and pelvis was performed following the standard protocol without IV contrast. RADIATION DOSE REDUCTION: This exam was performed according to the departmental dose-optimization program which includes automated exposure control, adjustment of the mA and/or kV according to patient size and/or use of iterative reconstruction technique. COMPARISON:  05/04/2022 FINDINGS: Lower chest: Unremarkable. Hepatobiliary: No suspicious focal abnormality in the liver on this study without intravenous contrast. Gallbladder is surgically absent. No intrahepatic or extrahepatic biliary dilation. Pancreas: No focal mass lesion. No dilatation of the main duct. No intraparenchymal cyst. No peripancreatic edema. Spleen: No splenomegaly. No focal mass lesion. Adrenals/Urinary Tract: No adrenal nodule or mass. No hydronephrosis. No evidence for kidney stones. No secondary changes in either  kidney or ureter. No ureteral or bladder stones. Stomach/Bowel: Stomach is unremarkable. No gastric wall thickening. No evidence of outlet obstruction. Duodenum is normally positioned as is the ligament of Treitz. No small bowel wall thickening. No small bowel dilatation. The terminal ileum is normal. The appendix is not well visualized, but there is no edema or inflammation in the region of the cecum. No gross colonic mass. No colonic wall thickening. Vascular/Lymphatic: There is moderate atherosclerotic calcification of the abdominal aorta without aneurysm. There is no gastrohepatic or hepatoduodenal ligament lymphadenopathy. No retroperitoneal or mesenteric lymphadenopathy. No pelvic sidewall lymphadenopathy. Reproductive: Unremarkable. Other: No intraperitoneal free fluid. Musculoskeletal: Degenerative changes are noted in the hips bilaterally. No worrisome lytic or sclerotic osseous abnormality. Marked  compression deformity noted at L1, progressive in the interval. Stable mild superior endplate compression deformity at L5 with new mild superior endplate compression deformity at L4. IMPRESSION: 1. No acute findings in the abdomen or pelvis. Specifically, no findings to explain the patient's history of abdominal pain. 2. Marked compression deformity at L1, progressive in the interval. Stable mild superior endplate compression deformity at L5 with new mild superior endplate compression deformity at L4. These changes better characterized on dedicated lumbar spine CT performed at the same time and reader is directed to the report for that study that has been dictated separately. 3.  Aortic Atherosclerosis (ICD10-I70.0). Electronically Signed   By: Misty Stanley M.D.   On: 01/22/2023 18:54   DG Lumbar Spine Complete  Result Date: 01/22/2023 CLINICAL DATA:  Back pain radiating to the right lower extremity EXAM: LUMBAR SPINE - COMPLETE 4+ VIEW COMPARISON:  CT AP 05/04/22 FINDINGS: There are 5 lumbar type vertebral  bodies. Redemonstrated is a severe compression deformity L1 with likely slight interval increase in height loss compared to 05/05/2019. Unchanged mild height loss at L5. Compared to prior exam there may be new superior endplate compression deformity of the L4 vertebral body level. Aortic atherosclerotic calcifications. Assessment of the sacrum is limited due to overlying bowel gas. Surgical clips in the right upper quadrant. Moderate colonic stool burden. IMPRESSION: Compared to prior exam there may be a new superior endplate compression deformity of the L4 vertebral body level. Electronically Signed   By: Marin Roberts M.D.   On: 01/22/2023 17:00     Assessment/Plan Carotid stenosis, left Her carotid duplex today demonstrates 1 to 39% right ICA stenosis and a widely patent left carotid stent.  Not entirely clear what she is taking currently, but I would recommend at least aspirin and a statin agent and Plavix would be reasonable as well.  No restrictions from vascular point of view for any upcoming surgery for her back or other procedures.  Recommend annual follow-up with duplex.    Leotis Pain, MD  02/04/2023 3:57 PM    This note was created with Dragon medical transcription system.  Any errors from dictation are purely unintentional

## 2023-02-04 NOTE — Assessment & Plan Note (Signed)
blood pressure control important in reducing the progression of atherosclerotic disease. On appropriate oral medications.  

## 2023-02-05 NOTE — Telephone Encounter (Signed)
Spoke with patient. The last call we have from her was on 3/22, script was sent same day. Patient notified.

## 2023-02-11 ENCOUNTER — Other Ambulatory Visit: Payer: Self-pay | Admitting: Neurosurgery

## 2023-02-11 DIAGNOSIS — S32001A Stable burst fracture of unspecified lumbar vertebra, initial encounter for closed fracture: Secondary | ICD-10-CM

## 2023-02-14 ENCOUNTER — Other Ambulatory Visit: Payer: Self-pay | Admitting: Neurosurgery

## 2023-02-14 DIAGNOSIS — S32001A Stable burst fracture of unspecified lumbar vertebra, initial encounter for closed fracture: Secondary | ICD-10-CM

## 2023-02-17 ENCOUNTER — Other Ambulatory Visit: Payer: Self-pay | Admitting: Orthopedic Surgery

## 2023-02-17 ENCOUNTER — Encounter: Payer: Medicare HMO | Admitting: Obstetrics and Gynecology

## 2023-02-17 DIAGNOSIS — M75100 Unspecified rotator cuff tear or rupture of unspecified shoulder, not specified as traumatic: Secondary | ICD-10-CM

## 2023-02-26 ENCOUNTER — Inpatient Hospital Stay (HOSPITAL_COMMUNITY): Admission: RE | Admit: 2023-02-26 | Payer: Medicare HMO | Source: Ambulatory Visit

## 2023-02-26 ENCOUNTER — Encounter (HOSPITAL_COMMUNITY): Payer: Self-pay

## 2023-03-03 ENCOUNTER — Encounter: Payer: Self-pay | Admitting: Orthopedic Surgery

## 2023-03-05 ENCOUNTER — Encounter: Payer: Self-pay | Admitting: Orthopedic Surgery

## 2023-03-09 ENCOUNTER — Ambulatory Visit
Admission: RE | Admit: 2023-03-09 | Discharge: 2023-03-09 | Disposition: A | Discharge status: Home / Self Care | Payer: Medicare HMO | Source: Ambulatory Visit | Attending: Neurosurgery | Admitting: Neurosurgery

## 2023-03-09 ENCOUNTER — Ambulatory Visit
Admission: RE | Admit: 2023-03-09 | Discharge: 2023-03-09 | Disposition: A | Discharge status: Home / Self Care | Payer: Medicare HMO | Source: Ambulatory Visit | Attending: Orthopedic Surgery | Admitting: Orthopedic Surgery

## 2023-03-09 DIAGNOSIS — S32001A Stable burst fracture of unspecified lumbar vertebra, initial encounter for closed fracture: Secondary | ICD-10-CM

## 2023-03-09 DIAGNOSIS — M75100 Unspecified rotator cuff tear or rupture of unspecified shoulder, not specified as traumatic: Secondary | ICD-10-CM

## 2023-03-11 ENCOUNTER — Ambulatory Visit: Payer: Medicare HMO | Admitting: Neurology

## 2023-03-11 ENCOUNTER — Encounter: Payer: Self-pay | Admitting: Neurology

## 2023-03-14 ENCOUNTER — Encounter: Payer: Self-pay | Admitting: Orthopedic Surgery

## 2023-03-21 ENCOUNTER — Ambulatory Visit
Admission: RE | Admit: 2023-03-21 | Discharge: 2023-03-21 | Disposition: A | Discharge status: Home / Self Care | Payer: Medicare HMO | Source: Ambulatory Visit | Attending: Family Medicine | Admitting: Family Medicine

## 2023-03-21 DIAGNOSIS — Z1231 Encounter for screening mammogram for malignant neoplasm of breast: Secondary | ICD-10-CM

## 2023-03-28 ENCOUNTER — Ambulatory Visit: Payer: Medicare HMO | Admitting: Family Medicine

## 2023-04-02 ENCOUNTER — Other Ambulatory Visit: Payer: Medicare HMO

## 2023-04-04 ENCOUNTER — Ambulatory Visit: Payer: Medicare HMO | Admitting: Family Medicine

## 2023-04-09 ENCOUNTER — Other Ambulatory Visit: Payer: Self-pay | Admitting: Family Medicine

## 2023-04-09 DIAGNOSIS — E119 Type 2 diabetes mellitus without complications: Secondary | ICD-10-CM

## 2023-04-09 DIAGNOSIS — I10 Essential (primary) hypertension: Secondary | ICD-10-CM

## 2023-04-09 DIAGNOSIS — G2581 Restless legs syndrome: Secondary | ICD-10-CM

## 2023-05-09 ENCOUNTER — Telehealth: Payer: Self-pay | Admitting: Family Medicine

## 2023-05-09 NOTE — Telephone Encounter (Signed)
Prescription Request  05/09/2023  LOV: 01/22/2023  What is the name of the medication or equipment? gabapentin (NEURONTIN) 100 MG capsule   Have you contacted your pharmacy to request a refill? No   Which pharmacy would you like this sent to?  Pharmacy  [facility] - [place], [place] - [address] [place], [place] [address] Phone: [phone]  Fax: [phone] DEA #: [id number]   Patient notified that their request is being sent to the clinical staff for review and that they should receive a response within 2 business days.   Please advise at Mobile [phone] (mobile)

## 2023-05-12 MED ORDER — GABAPENTIN 100 MG PO CAPS: 100.0000 mg | ORAL_CAPSULE | Freq: Three times a day (TID) | ORAL | 2 refills | Status: DC

## 2023-05-12 NOTE — Telephone Encounter (Signed)
sent 

## 2023-06-02 DIAGNOSIS — M797 Fibromyalgia: Secondary | ICD-10-CM | POA: Diagnosis not present

## 2023-06-02 DIAGNOSIS — M5451 Vertebrogenic low back pain: Secondary | ICD-10-CM | POA: Diagnosis not present

## 2023-06-02 DIAGNOSIS — Z79899 Other long term (current) drug therapy: Secondary | ICD-10-CM | POA: Diagnosis not present

## 2023-06-02 DIAGNOSIS — Z79891 Long term (current) use of opiate analgesic: Secondary | ICD-10-CM | POA: Diagnosis not present

## 2023-06-02 DIAGNOSIS — G894 Chronic pain syndrome: Secondary | ICD-10-CM | POA: Diagnosis not present

## 2023-06-06 ENCOUNTER — Ambulatory Visit: Payer: Medicare HMO

## 2023-06-06 ENCOUNTER — Other Ambulatory Visit: Payer: Self-pay

## 2023-06-06 DIAGNOSIS — Z0000 Encounter for general adult medical examination without abnormal findings: Secondary | ICD-10-CM

## 2023-06-06 NOTE — Progress Notes (Signed)
Subjective:   Veronica Murray is a 67 y.o. female who presents for Medicare Annual (Subsequent) preventive examination.  Visit Complete: In person  Patient Medicare AWV questionnaire was completed by the patient on 06/06/2023; I have confirmed that all information answered by patient is correct and no changes since this date.  Review of Systems    N/a       Objective:    Today's Vitals   06/06/23 1306  PainSc: 10-Worst pain ever   There is no height or weight on file to calculate BMI.     01/27/2023    3:44 PM 01/22/2023    3:46 PM 06/05/2022    8:00 PM 06/03/2022    9:34 AM 05/13/2022    1:07 PM 05/04/2022   11:02 AM 03/04/2022    1:02 PM  Advanced Directives  Does Patient Have a Medical Advance Directive? No No Yes Yes Yes Yes   Type of Pension scheme manager Power of [employer] Power of Attorney   Does patient want to make changes to medical advance directive?   No - Patient declined   No - Patient declined   Copy of Healthcare Power of Attorney in Chart?   No - copy requested   No - copy requested   Would patient like information on creating a medical advance directive?  No - Patient declined     No - Patient declined    Current Medications (verified) Outpatient Encounter Medications as of 06/06/2023  Medication Sig   ACETAMINOPHEN EXTRA STRENGTH 500 MG tablet Take 1,000 mg by mouth 2 (two) times daily. (Patient not taking: Reported on 02/04/2023)   albuterol (VENTOLIN HFA) 108 (90 Base) MCG/ACT inhaler Inhale 2 puffs into the lungs every 6 (six) hours as needed for wheezing or shortness of breath. (Patient not taking: Reported on 02/04/2023)   atorvastatin (LIPITOR) 10 MG tablet Take 1 tablet (10 mg total) by mouth daily. (Patient not taking: Reported on 02/04/2023)   gabapentin (NEURONTIN) 100 MG capsule Take 1 capsule (100 mg total) by mouth 3 (three) times daily.   glimepiride (AMARYL) 1 MG tablet  TAKE 1 TABLET (1 MG TOTAL) BY MOUTH DAILY WITH BREAKFAST.   ibuprofen (ADVIL) 600 MG tablet Take 600 mg by mouth every 6 (six) hours as needed for moderate pain. (Patient not taking: Reported on 02/04/2023)   insulin glargine (LANTUS SOLOSTAR) 100 UNIT/ML Solostar Pen Inject 5 Units into the skin daily.   lisinopril (ZESTRIL) 5 MG tablet Take 1 tablet (5 mg total) by mouth daily. (Patient not taking: Reported on 02/04/2023)   No facility-administered encounter medications on file as of 06/06/2023.    Allergies (verified) Codeine, Toradol [ketorolac tromethamine], and Tramadol hcl   History: Past Medical History:  Diagnosis Date   Aortic arch atherosclerosis (HCC)    Diabetes mellitus without complication (HCC)    Encephalopathy    Essential hypertension    Fibromyalgia    Stroke (HCC)    Tobacco abuse    Past Surgical History:  Procedure Laterality Date   CAROTID PTA/STENT INTERVENTION Left 01/24/2022   Procedure: CAROTID PTA/STENT INTERVENTION;  Surgeon: [name], MD;  Location: [facility];  Service: Cardiovascular;  Laterality: Left;   CAROTID PTA/STENT INTERVENTION Left 02/28/2022   Procedure: CAROTID PTA/STENT INTERVENTION;  Surgeon: [name], MD;  Location: [facility];  Service: Cardiovascular;  Laterality: Left;   CERVICAL BIOPSY  CHOLECYSTECTOMY     ENDARTERECTOMY Left 01/25/2022   Procedure: ENDARTERECTOMY CAROTID, possible ligation;  Surgeon: [name], MD;  Location: [facility];  Service: Vascular;  Laterality: Left;   ENDARTERECTOMY Left 02/25/2022   Procedure: ENDARTERECTOMY CAROTID revision;  Surgeon: Bertram Denver, MD;  Location: [facility];  Service: Vascular;  Laterality: Left;   HERNIA REPAIR     LEFT HEART CATH AND CORONARY ANGIOGRAPHY Left 07/05/2019   Procedure: LEFT HEART CATH AND CORONARY ANGIOGRAPHY;  Surgeon: Iran Ouch, MD;  Location: [facility];  Service: Cardiovascular;  Laterality: Left;   Family History   Problem Relation Age of Onset   Breast cancer Mother 41 - 25       late   CAD Neg Hx    Social History   Socioeconomic History   Marital status: Married    Spouse name: Not on file   Number of children: Not on file   Years of education: Not on file   Highest education level: Not on file  Occupational History   Not on file  Tobacco Use   Smoking status: Every Day    Current packs/day: 1.00    Average packs/day: 1 pack/day for 50.0 years (50.0 ttl pk-yrs)    Types: Cigarettes   Smokeless tobacco: Never  Vaping Use   Vaping status: Never Used  Substance and Sexual Activity   Alcohol use: Not Currently   Drug use: Not Currently    Frequency: 7.0 times per week    Types: Marijuana   Sexual activity: Not on file  Other Topics Concern   Not on file  Social History Narrative   Not on file   Social Determinants of Health   Financial Resource Strain: Low Risk  (06/06/2023)   Overall Financial Resource Strain (CARDIA)    Difficulty of Paying Living Expenses: Not hard at all  Food Insecurity: No Food Insecurity (06/06/2023)   Hunger Vital Sign    Worried About Running Out of Food in the Last Year: Never true    Ran Out of Food in the Last Year: Never true  Transportation Needs: No Transportation Needs (06/06/2023)   PRAPARE - Administrator, Civil Service (Medical): No    Lack of Transportation (Non-Medical): No  Physical Activity: Inactive (06/06/2023)   Exercise Vital Sign    Days of Exercise per Week: 0 days    Minutes of Exercise per Session: 0 min  Stress: No Stress Concern Present (06/06/2023)   [employer] of Occupational Health - Occupational Stress Questionnaire    Feeling of Stress : Not at all  Social Connections: Moderately Isolated (06/06/2023)   Social Connection and Isolation Panel [NHANES]    Frequency of Communication with Friends and Family: More than three times a week    Frequency of Social Gatherings with Friends and Family: More than  three times a week    Attends Religious Services: Never    Database administrator or Organizations: No    Attends Engineer, structural: Never    Marital Status: Married    Tobacco Counseling Ready to quit: Not Answered Counseling given: Not Answered   Clinical Intake:  Pre-visit preparation completed: Yes  Pain : 0-10 Pain Score: 10-Worst pain ever Pain Type: Chronic pain Pain Location: Back Pain Onset: More than a month ago Pain Frequency: Constant Pain Relieving Factors: see pain clinic in [place]  Pain Relieving Factors: see pain clinic in [place]   Diabetes: Yes CBG  done?: No CBG resulted in Enter/ Edit results?: Yes Did pt. bring in CBG monitor from home?: No  How often do you need to have someone help you when you read instructions, pamphlets, or other written materials from your doctor or pharmacy?: 2 - Rarely  Interpreter Needed?: No      Activities of Daily Living     No data to display          Patient Care Team: Del Newman Nip, Tenna Child, FNP as PCP - General (Family Medicine)  Indicate any recent Medical Services you may have received from other than [facility] providers in the past year (date may be approximate).     Assessment:   This is a routine wellness examination for [facility].  Hearing/Vision screen No results found.  Dietary issues and exercise activities discussed:     Goals Addressed             This Visit's Progress    Maintain Mobility and Function       To feel better and do better for herself.       Depression Screen    06/06/2023    1:10 PM 01/22/2023    2:52 PM 01/03/2023    2:18 PM 12/20/2022    2:36 PM 04/02/2022   11:01 AM  PHQ 2/9 Scores  PHQ - 2 Score 6 4 4 6  0  PHQ- 9 Score 21 17 17 21      Fall Risk    06/06/2023    1:12 PM 01/22/2023    2:52 PM 01/03/2023    2:18 PM 12/20/2022    2:35 PM 04/02/2022   11:01 AM  Fall Risk   Falls in the past year? 0 0 0 1 1  Number falls in past yr: 0 0 0 1 0   Injury with Fall? 0 0 0 1 0  Risk for fall due to : No Fall Risks No Fall Risks No Fall Risks    Follow up Falls evaluation completed Falls evaluation completed Falls evaluation completed  Falls evaluation completed    MEDICARE RISK AT HOME:   TIMED UP AND GO:  Was the test performed?  No    Cognitive Function:        06/06/2023    1:12 PM  6CIT Screen  What Year? 0 points  What month? 0 points  What time? 0 points  Count back from 20 0 points  Months in reverse 2 points  Repeat phrase 0 points  Total Score 2 points    Immunizations Immunization History  Administered Date(s) Administered   Tdap 12/17/2017    TDAP status: Up to date  Flu Vaccine status: Declined, Education has been provided regarding the importance of this vaccine but patient still declined. Advised may receive this vaccine at local pharmacy or Health Dept. Aware to provide a copy of the vaccination record if obtained from local pharmacy or Health Dept. Verbalized acceptance and understanding.  Pneumococcal vaccine status: Declined,  Education has been provided regarding the importance of this vaccine but patient still declined. Advised may receive this vaccine at local pharmacy or Health Dept. Aware to provide a copy of the vaccination record if obtained from local pharmacy or Health Dept. Verbalized acceptance and understanding.   Covid-19 vaccine status: Information provided on how to obtain vaccines.   Qualifies for Shingles Vaccine? Yes   Zostavax completed No   Shingrix Completed?: No.    Education has been provided regarding the importance of this vaccine. Patient  has been advised to call insurance company to determine out of pocket expense if they have not yet received this vaccine. Advised may also receive vaccine at local pharmacy or Health Dept. Verbalized acceptance and understanding.  Screening Tests Health Maintenance  Topic Date Due   Medicare Annual Wellness (AWV)  Never done    Pneumonia Vaccine 68+ Years old (1 of 2 - PCV) Never done   OPHTHALMOLOGY EXAM  Never done   Diabetic kidney evaluation - Urine ACR  Never done   PAP SMEAR-Modifier  Never done   Colonoscopy  Never done   Zoster Vaccines- Shingrix (1 of 2) Never done   Lung Cancer Screening  06/24/2020   COVID-19 Vaccine (1 - 2023-24 season) Never done   DEXA SCAN  Never done   INFLUENZA VACCINE  06/12/2023   HEMOGLOBIN A1C  06/20/2023   Diabetic kidney evaluation - eGFR measurement  12/21/2023   FOOT EXAM  12/21/2023   MAMMOGRAM  03/20/2025   DTaP/Tdap/Td (2 - Td or Tdap) 12/18/2027   Hepatitis C Screening  Completed   HIV Screening  Completed   HPV VACCINES  Aged Out    Health Maintenance  Health Maintenance Due  Topic Date Due   Medicare Annual Wellness (AWV)  Never done   Pneumonia Vaccine 58+ Years old (1 of 2 - PCV) Never done   OPHTHALMOLOGY EXAM  Never done   Diabetic kidney evaluation - Urine ACR  Never done   PAP SMEAR-Modifier  Never done   Colonoscopy  Never done   Zoster Vaccines- Shingrix (1 of 2) Never done   Lung Cancer Screening  06/24/2020   COVID-19 Vaccine (1 - 2023-24 season) Never done   DEXA SCAN  Never done    Colonoscopy: refused  Mammogram status: Completed 03/21/2023. Repeat every year  Bone Density status: Ordered 06/06/2023. Pt provided with contact info and advised to call to schedule appt.  Lung Cancer Screening: (Low Dose CT Chest recommended if Age 6-80 years, 20 pack-year currently smoking OR have quit w/in 15years.) does not qualify.   Lung Cancer Screening Referral: pending  Additional Screening:  Hepatitis C Screening: does not qualify; Completed   Vision Screening: Recommended annual ophthalmology exams for early detection of glaucoma and other disorders of the eye. Is the patient up to date with their annual eye exam?  No  Who is the provider or what is the name of the office in which the patient attends annual eye exams? N/a If pt is not  established with a provider, would they like to be referred to a provider to establish care? Yes .   Dental Screening: Recommended annual dental exams for proper oral hygiene  Diabetic Foot Exam: Diabetic Foot Exam: Completed 12/20/22  Community Resource Referral / Chronic Care Management: CRR required this visit?  No   CCM required this visit?  No     Plan:     I have personally reviewed and noted the following in the patient's chart:   Medical and social history Use of alcohol, tobacco or illicit drugs  Current medications and supplements including opioid prescriptions. Patient is currently taking opioid prescriptions. Information provided to patient regarding non-opioid alternatives. Patient advised to discuss non-opioid treatment plan with their provider. Functional ability and status Nutritional status Physical activity Advanced directives List of other physicians Hospitalizations, surgeries, and ER visits in previous 12 months Vitals Screenings to include cognitive, depression, and falls Referrals and appointments  In addition, I have reviewed and discussed with patient  certain preventive protocols, quality metrics, and best practice recommendations. A written personalized care plan for preventive services as well as general preventive health recommendations were provided to patient.     Herbie Saxon, [place]   06/06/2023   After Visit Summary: (Mail) Due to this being a telephonic visit, the after visit summary with patients personalized plan was offered to patient via mail   Nurse Notes: n/a

## 2023-06-30 DIAGNOSIS — G894 Chronic pain syndrome: Secondary | ICD-10-CM | POA: Diagnosis not present

## 2023-06-30 DIAGNOSIS — M5451 Vertebrogenic low back pain: Secondary | ICD-10-CM | POA: Diagnosis not present

## 2023-06-30 DIAGNOSIS — M797 Fibromyalgia: Secondary | ICD-10-CM | POA: Diagnosis not present

## 2023-07-03 DIAGNOSIS — E1140 Type 2 diabetes mellitus with diabetic neuropathy, unspecified: Secondary | ICD-10-CM | POA: Diagnosis not present

## 2023-07-03 DIAGNOSIS — S32010S Wedge compression fracture of first lumbar vertebra, sequela: Secondary | ICD-10-CM | POA: Diagnosis not present

## 2023-07-03 DIAGNOSIS — F321 Major depressive disorder, single episode, moderate: Secondary | ICD-10-CM | POA: Diagnosis not present

## 2023-07-03 DIAGNOSIS — Z95828 Presence of other vascular implants and grafts: Secondary | ICD-10-CM | POA: Diagnosis not present

## 2023-07-03 DIAGNOSIS — G894 Chronic pain syndrome: Secondary | ICD-10-CM | POA: Diagnosis not present

## 2023-07-03 DIAGNOSIS — M4807 Spinal stenosis, lumbosacral region: Secondary | ICD-10-CM | POA: Diagnosis not present

## 2023-07-03 DIAGNOSIS — Z8673 Personal history of transient ischemic attack (TIA), and cerebral infarction without residual deficits: Secondary | ICD-10-CM | POA: Diagnosis not present

## 2023-07-03 DIAGNOSIS — N189 Chronic kidney disease, unspecified: Secondary | ICD-10-CM | POA: Diagnosis not present

## 2023-07-03 DIAGNOSIS — R6889 Other general symptoms and signs: Secondary | ICD-10-CM | POA: Diagnosis not present

## 2023-07-03 DIAGNOSIS — F1190 Opioid use, unspecified, uncomplicated: Secondary | ICD-10-CM | POA: Diagnosis not present

## 2023-07-03 DIAGNOSIS — E1169 Type 2 diabetes mellitus with other specified complication: Secondary | ICD-10-CM | POA: Diagnosis not present

## 2023-07-07 ENCOUNTER — Other Ambulatory Visit: Payer: Self-pay | Admitting: Family Medicine

## 2023-08-01 DIAGNOSIS — I129 Hypertensive chronic kidney disease with stage 1 through stage 4 chronic kidney disease, or unspecified chronic kidney disease: Secondary | ICD-10-CM | POA: Diagnosis not present

## 2023-08-01 DIAGNOSIS — M79601 Pain in right arm: Secondary | ICD-10-CM | POA: Diagnosis not present

## 2023-08-01 DIAGNOSIS — N189 Chronic kidney disease, unspecified: Secondary | ICD-10-CM | POA: Diagnosis not present

## 2023-08-01 DIAGNOSIS — M797 Fibromyalgia: Secondary | ICD-10-CM | POA: Diagnosis not present

## 2023-08-01 DIAGNOSIS — S129XXA Fracture of neck, unspecified, initial encounter: Secondary | ICD-10-CM | POA: Diagnosis not present

## 2023-08-01 DIAGNOSIS — Z886 Allergy status to analgesic agent status: Secondary | ICD-10-CM | POA: Diagnosis not present

## 2023-08-01 DIAGNOSIS — E1122 Type 2 diabetes mellitus with diabetic chronic kidney disease: Secondary | ICD-10-CM | POA: Diagnosis not present

## 2023-08-01 DIAGNOSIS — Z885 Allergy status to narcotic agent status: Secondary | ICD-10-CM | POA: Diagnosis not present

## 2023-08-01 DIAGNOSIS — M19011 Primary osteoarthritis, right shoulder: Secondary | ICD-10-CM | POA: Diagnosis not present

## 2023-08-01 DIAGNOSIS — E1140 Type 2 diabetes mellitus with diabetic neuropathy, unspecified: Secondary | ICD-10-CM | POA: Diagnosis not present

## 2023-08-01 DIAGNOSIS — Z7982 Long term (current) use of aspirin: Secondary | ICD-10-CM | POA: Diagnosis not present

## 2023-08-01 DIAGNOSIS — S42291A Other displaced fracture of upper end of right humerus, initial encounter for closed fracture: Secondary | ICD-10-CM | POA: Diagnosis not present

## 2023-08-07 DIAGNOSIS — S42211A Unspecified displaced fracture of surgical neck of right humerus, initial encounter for closed fracture: Secondary | ICD-10-CM | POA: Diagnosis not present

## 2023-08-07 DIAGNOSIS — M25511 Pain in right shoulder: Secondary | ICD-10-CM | POA: Diagnosis not present

## 2023-08-07 DIAGNOSIS — W1830XA Fall on same level, unspecified, initial encounter: Secondary | ICD-10-CM | POA: Diagnosis not present

## 2023-08-15 ENCOUNTER — Ambulatory Visit: Payer: Medicare HMO | Admitting: Family Medicine

## 2023-08-18 ENCOUNTER — Encounter: Payer: Self-pay | Admitting: Family Medicine

## 2023-08-22 DIAGNOSIS — S42211A Unspecified displaced fracture of surgical neck of right humerus, initial encounter for closed fracture: Secondary | ICD-10-CM | POA: Diagnosis not present

## 2023-08-22 DIAGNOSIS — X58XXXA Exposure to other specified factors, initial encounter: Secondary | ICD-10-CM | POA: Diagnosis not present

## 2023-09-19 DIAGNOSIS — M25511 Pain in right shoulder: Secondary | ICD-10-CM | POA: Diagnosis not present

## 2023-12-11 ENCOUNTER — Encounter: Payer: Self-pay | Admitting: *Deleted

## 2024-02-04 ENCOUNTER — Ambulatory Visit (INDEPENDENT_AMBULATORY_CARE_PROVIDER_SITE_OTHER): Payer: Medicare HMO | Admitting: Nurse Practitioner

## 2024-02-04 ENCOUNTER — Encounter (INDEPENDENT_AMBULATORY_CARE_PROVIDER_SITE_OTHER): Payer: Medicare HMO

## 2024-04-01 DIAGNOSIS — F02A4 Dementia in other diseases classified elsewhere, mild, with anxiety: Secondary | ICD-10-CM | POA: Diagnosis not present

## 2024-04-01 DIAGNOSIS — M5431 Sciatica, right side: Secondary | ICD-10-CM | POA: Diagnosis not present

## 2024-04-01 DIAGNOSIS — G458 Other transient cerebral ischemic attacks and related syndromes: Secondary | ICD-10-CM | POA: Diagnosis not present

## 2024-04-01 DIAGNOSIS — Z6830 Body mass index (BMI) 30.0-30.9, adult: Secondary | ICD-10-CM | POA: Diagnosis not present

## 2024-04-01 DIAGNOSIS — J449 Chronic obstructive pulmonary disease, unspecified: Secondary | ICD-10-CM | POA: Diagnosis not present

## 2024-04-01 DIAGNOSIS — E1143 Type 2 diabetes mellitus with diabetic autonomic (poly)neuropathy: Secondary | ICD-10-CM | POA: Diagnosis not present

## 2024-04-01 DIAGNOSIS — I10 Essential (primary) hypertension: Secondary | ICD-10-CM | POA: Diagnosis not present

## 2024-04-01 DIAGNOSIS — E0843 Diabetes mellitus due to underlying condition with diabetic autonomic (poly)neuropathy: Secondary | ICD-10-CM | POA: Diagnosis not present

## 2024-04-01 DIAGNOSIS — F331 Major depressive disorder, recurrent, moderate: Secondary | ICD-10-CM | POA: Diagnosis not present

## 2024-05-19 DIAGNOSIS — R4182 Altered mental status, unspecified: Secondary | ICD-10-CM | POA: Diagnosis not present

## 2024-05-19 DIAGNOSIS — Z5321 Procedure and treatment not carried out due to patient leaving prior to being seen by health care provider: Secondary | ICD-10-CM | POA: Diagnosis not present

## 2024-05-19 DIAGNOSIS — R600 Localized edema: Secondary | ICD-10-CM | POA: Diagnosis not present

## 2024-05-19 DIAGNOSIS — Z136 Encounter for screening for cardiovascular disorders: Secondary | ICD-10-CM | POA: Diagnosis not present

## 2024-05-19 DIAGNOSIS — R531 Weakness: Secondary | ICD-10-CM | POA: Diagnosis not present

## 2024-07-14 ENCOUNTER — Ambulatory Visit: Payer: Medicare HMO

## 2024-08-24 ENCOUNTER — Ambulatory Visit
